# Patient Record
Sex: Male | Born: 1935 | Race: White | Hispanic: No | Marital: Married | Smoking: Former smoker
Health system: Southern US, Community
[De-identification: ages and names within clinical notes are randomized; demographics above are authoritative.]

## PROBLEM LIST (undated history)

## (undated) DIAGNOSIS — I459 Conduction disorder, unspecified: Secondary | ICD-10-CM

## (undated) DIAGNOSIS — I5022 Chronic systolic (congestive) heart failure: Secondary | ICD-10-CM

## (undated) DIAGNOSIS — I472 Ventricular tachycardia, unspecified: Secondary | ICD-10-CM

## (undated) DIAGNOSIS — I509 Heart failure, unspecified: Secondary | ICD-10-CM

## (undated) DIAGNOSIS — I519 Heart disease, unspecified: Secondary | ICD-10-CM

## (undated) DIAGNOSIS — K219 Gastro-esophageal reflux disease without esophagitis: Secondary | ICD-10-CM

## (undated) DIAGNOSIS — Z8719 Personal history of other diseases of the digestive system: Secondary | ICD-10-CM

## (undated) DIAGNOSIS — I10 Essential (primary) hypertension: Secondary | ICD-10-CM

## (undated) DIAGNOSIS — M1710 Unilateral primary osteoarthritis, unspecified knee: Secondary | ICD-10-CM

## (undated) DIAGNOSIS — I2589 Other forms of chronic ischemic heart disease: Secondary | ICD-10-CM

## (undated) DIAGNOSIS — I4891 Unspecified atrial fibrillation: Secondary | ICD-10-CM

## (undated) DIAGNOSIS — IMO0001 Reserved for inherently not codable concepts without codable children: Secondary | ICD-10-CM

## (undated) DIAGNOSIS — Z86010 Personal history of colonic polyps: Secondary | ICD-10-CM

## (undated) DIAGNOSIS — I059 Rheumatic mitral valve disease, unspecified: Secondary | ICD-10-CM

## (undated) DIAGNOSIS — N259 Disorder resulting from impaired renal tubular function, unspecified: Secondary | ICD-10-CM

## (undated) DIAGNOSIS — I6529 Occlusion and stenosis of unspecified carotid artery: Secondary | ICD-10-CM

## (undated) DIAGNOSIS — I25810 Atherosclerosis of coronary artery bypass graft(s) without angina pectoris: Secondary | ICD-10-CM

## (undated) DIAGNOSIS — E785 Hyperlipidemia, unspecified: Secondary | ICD-10-CM

## (undated) HISTORY — DX: Personal history of colonic polyps: Z86.010

## (undated) HISTORY — DX: Hyperlipidemia, unspecified: E78.5

## (undated) HISTORY — DX: Essential (primary) hypertension: I10

## (undated) HISTORY — DX: Atherosclerosis of coronary artery bypass graft(s) without angina pectoris: I25.810

## (undated) HISTORY — DX: Heart disease, unspecified: I51.9

## (undated) HISTORY — DX: Occlusion and stenosis of unspecified carotid artery: I65.29

## (undated) HISTORY — DX: Ventricular tachycardia, unspecified: I47.20

## (undated) HISTORY — PX: INSERT / REPLACE / REMOVE PACEMAKER: SUR710

## (undated) HISTORY — DX: Personal history of other diseases of the digestive system: Z87.19

## (undated) HISTORY — DX: Unspecified atrial fibrillation: I48.91

## (undated) HISTORY — PX: CHOLECYSTECTOMY: SHX55

## (undated) HISTORY — DX: Rheumatic mitral valve disease, unspecified: I05.9

## (undated) HISTORY — DX: Disorder resulting from impaired renal tubular function, unspecified: N25.9

## (undated) HISTORY — DX: Other forms of chronic ischemic heart disease: I25.89

## (undated) HISTORY — DX: Heart failure, unspecified: I50.9

## (undated) HISTORY — PX: THORACOTOMY: SUR1349

## (undated) HISTORY — DX: Chronic systolic (congestive) heart failure: I50.22

## (undated) HISTORY — PX: CARDIAC SURGERY: SHX584

## (undated) HISTORY — DX: Ventricular tachycardia: I47.2

## (undated) HISTORY — DX: Conduction disorder, unspecified: I45.9

## (undated) HISTORY — DX: Gastro-esophageal reflux disease without esophagitis: K21.9

## (undated) HISTORY — DX: Unilateral primary osteoarthritis, unspecified knee: M17.10

## (undated) HISTORY — PX: APPENDECTOMY: SHX54

---
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
## 1985-02-03 HISTORY — PX: LUNG SURGERY: SHX703

## 1997-08-10 ENCOUNTER — Inpatient Hospital Stay (HOSPITAL_COMMUNITY): Admission: EM | Admit: 1997-08-10 | Discharge: 1997-08-20 | Payer: Self-pay | Admitting: Emergency Medicine

## 1997-09-12 ENCOUNTER — Encounter (HOSPITAL_COMMUNITY): Admission: RE | Admit: 1997-09-12 | Discharge: 1997-12-11 | Payer: Self-pay | Admitting: Cardiology

## 1997-11-06 ENCOUNTER — Inpatient Hospital Stay (HOSPITAL_COMMUNITY): Admission: AD | Admit: 1997-11-06 | Discharge: 1997-11-10 | Payer: Self-pay | Admitting: Cardiology

## 1997-12-04 ENCOUNTER — Encounter: Payer: Self-pay | Admitting: Cardiology

## 1997-12-04 ENCOUNTER — Ambulatory Visit (HOSPITAL_COMMUNITY): Admission: RE | Admit: 1997-12-04 | Discharge: 1997-12-04 | Payer: Self-pay | Admitting: Cardiology

## 1997-12-12 ENCOUNTER — Encounter (HOSPITAL_COMMUNITY): Admission: RE | Admit: 1997-12-12 | Discharge: 1998-03-12 | Payer: Self-pay | Admitting: Cardiology

## 1998-04-04 ENCOUNTER — Encounter: Payer: Self-pay | Admitting: Cardiology

## 1998-04-04 ENCOUNTER — Ambulatory Visit (HOSPITAL_COMMUNITY): Admission: RE | Admit: 1998-04-04 | Discharge: 1998-04-04 | Payer: Self-pay | Admitting: Cardiology

## 1999-02-26 ENCOUNTER — Ambulatory Visit (HOSPITAL_COMMUNITY): Admission: RE | Admit: 1999-02-26 | Discharge: 1999-02-26 | Payer: Self-pay | Admitting: Cardiology

## 1999-02-26 ENCOUNTER — Encounter: Payer: Self-pay | Admitting: Cardiology

## 2000-07-31 ENCOUNTER — Inpatient Hospital Stay (HOSPITAL_COMMUNITY): Admission: EM | Admit: 2000-07-31 | Discharge: 2000-08-04 | Payer: Self-pay | Admitting: Emergency Medicine

## 2000-07-31 ENCOUNTER — Encounter: Payer: Self-pay | Admitting: Emergency Medicine

## 2000-09-18 ENCOUNTER — Encounter (INDEPENDENT_AMBULATORY_CARE_PROVIDER_SITE_OTHER): Payer: Self-pay | Admitting: *Deleted

## 2000-09-18 ENCOUNTER — Ambulatory Visit (HOSPITAL_COMMUNITY): Admission: RE | Admit: 2000-09-18 | Discharge: 2000-09-18 | Payer: Self-pay | Admitting: Gastroenterology

## 2001-03-24 ENCOUNTER — Ambulatory Visit (HOSPITAL_COMMUNITY): Admission: RE | Admit: 2001-03-24 | Discharge: 2001-03-24 | Payer: Self-pay | Admitting: Gastroenterology

## 2001-03-24 ENCOUNTER — Encounter: Payer: Self-pay | Admitting: Gastroenterology

## 2001-11-29 ENCOUNTER — Ambulatory Visit (HOSPITAL_COMMUNITY): Admission: RE | Admit: 2001-11-29 | Discharge: 2001-11-30 | Payer: Self-pay | Admitting: Internal Medicine

## 2001-11-30 ENCOUNTER — Encounter: Payer: Self-pay | Admitting: Internal Medicine

## 2002-03-29 ENCOUNTER — Encounter: Admission: RE | Admit: 2002-03-29 | Discharge: 2002-03-29 | Payer: Self-pay | Admitting: Urology

## 2002-03-29 ENCOUNTER — Encounter: Payer: Self-pay | Admitting: Urology

## 2002-04-18 ENCOUNTER — Ambulatory Visit (HOSPITAL_COMMUNITY): Admission: RE | Admit: 2002-04-18 | Discharge: 2002-04-19 | Payer: Self-pay | Admitting: *Deleted

## 2002-04-18 ENCOUNTER — Encounter (INDEPENDENT_AMBULATORY_CARE_PROVIDER_SITE_OTHER): Payer: Self-pay | Admitting: Specialist

## 2002-07-05 ENCOUNTER — Ambulatory Visit (HOSPITAL_COMMUNITY): Admission: RE | Admit: 2002-07-05 | Discharge: 2002-07-06 | Payer: Self-pay | Admitting: Cardiology

## 2003-03-10 ENCOUNTER — Encounter: Admission: RE | Admit: 2003-03-10 | Discharge: 2003-03-10 | Payer: Self-pay | Admitting: Obstetrics and Gynecology

## 2003-03-17 ENCOUNTER — Inpatient Hospital Stay (HOSPITAL_COMMUNITY): Admission: EM | Admit: 2003-03-17 | Discharge: 2003-03-22 | Payer: Self-pay | Admitting: Emergency Medicine

## 2003-08-10 ENCOUNTER — Ambulatory Visit (HOSPITAL_COMMUNITY): Admission: RE | Admit: 2003-08-10 | Discharge: 2003-08-10 | Payer: Self-pay | Admitting: Family Medicine

## 2004-01-17 ENCOUNTER — Ambulatory Visit: Payer: Self-pay | Admitting: Internal Medicine

## 2004-03-22 ENCOUNTER — Ambulatory Visit (HOSPITAL_COMMUNITY): Admission: RE | Admit: 2004-03-22 | Discharge: 2004-03-22 | Payer: Self-pay | Admitting: Family Medicine

## 2004-04-05 ENCOUNTER — Ambulatory Visit: Payer: Self-pay | Admitting: Orthopedic Surgery

## 2004-04-12 ENCOUNTER — Ambulatory Visit: Payer: Self-pay

## 2004-05-10 ENCOUNTER — Ambulatory Visit: Admission: RE | Admit: 2004-05-10 | Discharge: 2004-05-10 | Payer: Self-pay | Admitting: Pulmonary Disease

## 2004-05-10 ENCOUNTER — Ambulatory Visit: Payer: Self-pay | Admitting: Internal Medicine

## 2004-05-20 ENCOUNTER — Ambulatory Visit: Payer: Self-pay | Admitting: Internal Medicine

## 2004-05-20 ENCOUNTER — Encounter (HOSPITAL_COMMUNITY): Admission: RE | Admit: 2004-05-20 | Discharge: 2004-08-18 | Payer: Self-pay | Admitting: Cardiology

## 2004-05-24 ENCOUNTER — Ambulatory Visit: Payer: Self-pay

## 2004-05-31 ENCOUNTER — Ambulatory Visit: Payer: Self-pay | Admitting: Internal Medicine

## 2004-06-05 ENCOUNTER — Ambulatory Visit (HOSPITAL_COMMUNITY): Admission: RE | Admit: 2004-06-05 | Discharge: 2004-06-05 | Payer: Self-pay | Admitting: Internal Medicine

## 2004-06-05 ENCOUNTER — Ambulatory Visit: Payer: Self-pay | Admitting: Internal Medicine

## 2004-06-10 ENCOUNTER — Ambulatory Visit: Payer: Self-pay | Admitting: Internal Medicine

## 2004-06-13 ENCOUNTER — Ambulatory Visit: Payer: Self-pay

## 2004-06-17 ENCOUNTER — Ambulatory Visit: Payer: Self-pay | Admitting: Internal Medicine

## 2004-06-24 ENCOUNTER — Ambulatory Visit: Payer: Self-pay | Admitting: Internal Medicine

## 2004-07-15 ENCOUNTER — Ambulatory Visit: Payer: Self-pay | Admitting: Internal Medicine

## 2004-07-23 ENCOUNTER — Ambulatory Visit: Payer: Self-pay | Admitting: Cardiology

## 2004-07-25 ENCOUNTER — Ambulatory Visit: Payer: Self-pay | Admitting: Internal Medicine

## 2004-08-12 ENCOUNTER — Ambulatory Visit: Admission: RE | Admit: 2004-08-12 | Discharge: 2004-08-12 | Payer: Self-pay | Admitting: Pulmonary Disease

## 2004-08-12 ENCOUNTER — Ambulatory Visit: Payer: Self-pay | Admitting: Pulmonary Disease

## 2004-08-19 ENCOUNTER — Encounter (HOSPITAL_COMMUNITY): Admission: RE | Admit: 2004-08-19 | Discharge: 2004-09-03 | Payer: Self-pay | Admitting: Cardiology

## 2004-09-19 ENCOUNTER — Ambulatory Visit: Payer: Self-pay

## 2004-10-04 ENCOUNTER — Ambulatory Visit (HOSPITAL_COMMUNITY): Admission: RE | Admit: 2004-10-04 | Discharge: 2004-10-04 | Payer: Self-pay | Admitting: Family Medicine

## 2004-10-25 ENCOUNTER — Ambulatory Visit: Payer: Self-pay | Admitting: Cardiology

## 2004-11-01 ENCOUNTER — Ambulatory Visit: Payer: Self-pay | Admitting: Internal Medicine

## 2004-11-04 ENCOUNTER — Ambulatory Visit: Payer: Self-pay | Admitting: Internal Medicine

## 2004-11-15 ENCOUNTER — Ambulatory Visit: Payer: Self-pay | Admitting: Cardiology

## 2004-11-22 ENCOUNTER — Encounter (HOSPITAL_COMMUNITY): Admission: RE | Admit: 2004-11-22 | Discharge: 2004-12-04 | Payer: Self-pay | Admitting: Cardiology

## 2005-01-13 ENCOUNTER — Inpatient Hospital Stay (HOSPITAL_COMMUNITY): Admission: EM | Admit: 2005-01-13 | Discharge: 2005-01-15 | Payer: Self-pay | Admitting: Emergency Medicine

## 2005-01-14 ENCOUNTER — Ambulatory Visit: Payer: Self-pay | Admitting: Cardiovascular Disease

## 2005-01-17 ENCOUNTER — Ambulatory Visit: Payer: Self-pay | Admitting: Cardiology

## 2005-01-31 ENCOUNTER — Ambulatory Visit: Payer: Self-pay | Admitting: Cardiology

## 2005-02-14 ENCOUNTER — Ambulatory Visit: Payer: Self-pay | Admitting: Cardiology

## 2005-02-21 ENCOUNTER — Encounter (HOSPITAL_COMMUNITY): Admission: RE | Admit: 2005-02-21 | Discharge: 2005-03-05 | Payer: Self-pay | Admitting: Cardiology

## 2005-03-06 ENCOUNTER — Ambulatory Visit: Payer: Self-pay

## 2005-03-06 ENCOUNTER — Ambulatory Visit: Payer: Self-pay | Admitting: Internal Medicine

## 2005-03-06 ENCOUNTER — Inpatient Hospital Stay (HOSPITAL_COMMUNITY): Admission: AD | Admit: 2005-03-06 | Discharge: 2005-03-18 | Payer: Self-pay | Admitting: Cardiology

## 2005-03-06 ENCOUNTER — Ambulatory Visit: Payer: Self-pay | Admitting: Cardiology

## 2005-03-26 ENCOUNTER — Ambulatory Visit: Payer: Self-pay | Admitting: Internal Medicine

## 2005-04-24 LAB — HM COLONOSCOPY: HM Colonoscopy: ABNORMAL

## 2005-04-25 ENCOUNTER — Ambulatory Visit: Payer: Self-pay | Admitting: Cardiology

## 2005-05-05 ENCOUNTER — Ambulatory Visit: Payer: Self-pay | Admitting: Pulmonary Disease

## 2005-05-06 ENCOUNTER — Ambulatory Visit: Admission: RE | Admit: 2005-05-06 | Discharge: 2005-05-06 | Payer: Self-pay | Admitting: Pulmonary Disease

## 2005-05-20 ENCOUNTER — Ambulatory Visit: Payer: Self-pay

## 2005-05-23 ENCOUNTER — Encounter (HOSPITAL_COMMUNITY): Admission: RE | Admit: 2005-05-23 | Discharge: 2005-08-21 | Payer: Self-pay | Admitting: Cardiology

## 2005-06-10 ENCOUNTER — Ambulatory Visit: Payer: Self-pay | Admitting: Internal Medicine

## 2005-06-27 ENCOUNTER — Ambulatory Visit: Payer: Self-pay | Admitting: Cardiology

## 2005-06-27 ENCOUNTER — Ambulatory Visit: Payer: Self-pay

## 2005-07-03 ENCOUNTER — Ambulatory Visit: Payer: Self-pay | Admitting: Cardiology

## 2005-07-17 ENCOUNTER — Ambulatory Visit: Payer: Self-pay | Admitting: Cardiology

## 2005-07-28 ENCOUNTER — Ambulatory Visit: Payer: Self-pay | Admitting: Cardiology

## 2005-08-13 ENCOUNTER — Encounter: Payer: Self-pay | Admitting: Family Medicine

## 2005-08-22 ENCOUNTER — Encounter (HOSPITAL_COMMUNITY): Admission: RE | Admit: 2005-08-22 | Discharge: 2005-09-02 | Payer: Self-pay | Admitting: Cardiology

## 2005-09-11 ENCOUNTER — Ambulatory Visit: Payer: Self-pay | Admitting: Internal Medicine

## 2005-10-17 ENCOUNTER — Ambulatory Visit: Payer: Self-pay | Admitting: Cardiology

## 2005-10-23 ENCOUNTER — Ambulatory Visit: Payer: Self-pay | Admitting: Cardiology

## 2005-10-27 ENCOUNTER — Ambulatory Visit: Payer: Self-pay | Admitting: Cardiology

## 2005-11-03 ENCOUNTER — Ambulatory Visit: Payer: Self-pay | Admitting: Cardiology

## 2005-11-12 ENCOUNTER — Inpatient Hospital Stay (HOSPITAL_COMMUNITY): Admission: EM | Admit: 2005-11-12 | Discharge: 2005-11-17 | Payer: Self-pay | Admitting: Emergency Medicine

## 2005-11-12 ENCOUNTER — Ambulatory Visit: Payer: Self-pay | Admitting: Cardiology

## 2005-11-13 ENCOUNTER — Encounter: Payer: Self-pay | Admitting: Cardiology

## 2005-11-13 ENCOUNTER — Encounter: Payer: Self-pay | Admitting: Vascular Surgery

## 2005-11-20 ENCOUNTER — Ambulatory Visit: Payer: Self-pay | Admitting: Internal Medicine

## 2005-11-27 ENCOUNTER — Ambulatory Visit: Payer: Self-pay | Admitting: Cardiology

## 2005-12-05 ENCOUNTER — Ambulatory Visit: Payer: Self-pay | Admitting: Cardiology

## 2005-12-11 ENCOUNTER — Ambulatory Visit: Payer: Self-pay | Admitting: Internal Medicine

## 2005-12-15 ENCOUNTER — Ambulatory Visit: Payer: Self-pay | Admitting: Cardiology

## 2005-12-15 ENCOUNTER — Ambulatory Visit: Payer: Self-pay | Admitting: Cardiovascular Disease

## 2006-01-02 ENCOUNTER — Ambulatory Visit: Payer: Self-pay | Admitting: Cardiology

## 2006-01-02 ENCOUNTER — Ambulatory Visit: Payer: Self-pay

## 2006-01-19 ENCOUNTER — Ambulatory Visit: Payer: Self-pay | Admitting: Cardiology

## 2006-02-02 ENCOUNTER — Ambulatory Visit: Payer: Self-pay | Admitting: Cardiovascular Disease

## 2006-02-16 ENCOUNTER — Ambulatory Visit: Payer: Self-pay | Admitting: Cardiology

## 2006-02-16 LAB — CONVERTED CEMR LAB
CO2: 29 meq/L (ref 19–32)
Chloride: 102 meq/L (ref 96–112)
Creatinine, Ser: 1.7 mg/dL — ABNORMAL HIGH (ref 0.4–1.5)
Glucose, Bld: 95 mg/dL (ref 70–99)
Magnesium: 2.3 mg/dL (ref 1.5–2.5)
Sodium: 137 meq/L (ref 135–145)

## 2006-03-09 ENCOUNTER — Ambulatory Visit: Payer: Self-pay | Admitting: Cardiology

## 2006-03-12 ENCOUNTER — Ambulatory Visit: Payer: Self-pay | Admitting: Internal Medicine

## 2006-03-30 ENCOUNTER — Ambulatory Visit: Payer: Self-pay | Admitting: Cardiology

## 2006-03-30 ENCOUNTER — Ambulatory Visit: Payer: Self-pay | Admitting: Internal Medicine

## 2006-04-23 ENCOUNTER — Ambulatory Visit: Payer: Self-pay | Admitting: Cardiology

## 2006-04-27 ENCOUNTER — Ambulatory Visit: Payer: Self-pay | Admitting: Internal Medicine

## 2006-05-26 ENCOUNTER — Ambulatory Visit: Payer: Self-pay | Admitting: Cardiology

## 2006-05-26 LAB — CONVERTED CEMR LAB
Chloride: 104 meq/L (ref 96–112)
Creatinine, Ser: 1.7 mg/dL — ABNORMAL HIGH (ref 0.4–1.5)
Glucose, Bld: 92 mg/dL (ref 70–99)
Sodium: 140 meq/L (ref 135–145)

## 2006-06-22 ENCOUNTER — Ambulatory Visit: Payer: Self-pay | Admitting: Cardiology

## 2006-06-25 ENCOUNTER — Ambulatory Visit: Payer: Self-pay | Admitting: Internal Medicine

## 2006-07-13 ENCOUNTER — Ambulatory Visit: Payer: Self-pay | Admitting: Cardiovascular Disease

## 2006-08-11 ENCOUNTER — Ambulatory Visit: Payer: Self-pay | Admitting: Cardiology

## 2006-09-08 ENCOUNTER — Ambulatory Visit: Payer: Self-pay | Admitting: Cardiology

## 2006-09-24 ENCOUNTER — Ambulatory Visit: Payer: Self-pay | Admitting: Internal Medicine

## 2006-10-06 ENCOUNTER — Ambulatory Visit: Payer: Self-pay | Admitting: Internal Medicine

## 2006-11-02 ENCOUNTER — Ambulatory Visit: Payer: Self-pay | Admitting: Cardiology

## 2006-11-23 ENCOUNTER — Ambulatory Visit: Payer: Self-pay | Admitting: Cardiology

## 2006-11-23 ENCOUNTER — Ambulatory Visit: Payer: Self-pay | Admitting: Internal Medicine

## 2006-11-23 LAB — CONVERTED CEMR LAB
Calcium: 9.6 mg/dL (ref 8.4–10.5)
Chloride: 104 meq/L (ref 96–112)
GFR calc non Af Amer: 49 mL/min
Glucose, Bld: 102 mg/dL — ABNORMAL HIGH (ref 70–99)
INR: 2.3 — ABNORMAL HIGH (ref 0.8–1.0)

## 2006-12-14 ENCOUNTER — Ambulatory Visit: Payer: Self-pay | Admitting: Cardiology

## 2006-12-24 ENCOUNTER — Ambulatory Visit: Payer: Self-pay | Admitting: Internal Medicine

## 2007-01-07 ENCOUNTER — Ambulatory Visit: Payer: Self-pay

## 2007-01-07 ENCOUNTER — Ambulatory Visit: Payer: Self-pay | Admitting: Cardiovascular Disease

## 2007-02-01 IMAGING — CT CT CHEST W/O CM
1 series · 15 of 31 positions shown, 19 images · IV contrast (agent unspecified)
Comparison: none

CLINICAL DATA: Follow-up lung mass.
 CT CHEST WITHOUT CONTRAST:
 Multidetector helical scans through the chest were performed in the unenhanced state.  This scan is compared to a prior CT of the chest of 08/10/03.
 The patchy opacity within the right lower lobe posteriorly has not change significantly.  This amorphus opacity is somewhat difficult to accurately measure but on today?s study measures approximately 36 x 16 mm compared to 33 x 14 mm previously. The configuration does not appear to have changed significantly and the slight variation of measurements most likely is due to scanning plane. I would suggest that this be followed up to two years from its initial detection which was in March 2003.  There appears to be a suture line in the right middle lobe on image 33 which has been present previously.  Other areas of pleural parenchymal scarring are stable as well. No effusion is seen. Pacer is noted. No mediastinal or hilar adenopathy is seen.

[Series 2: routine chest · axial · 0.76mm/px · z∈[-390,-85]mm · 15 of 67 slices shown, 19 images]
[im 3/67  mediastinal]
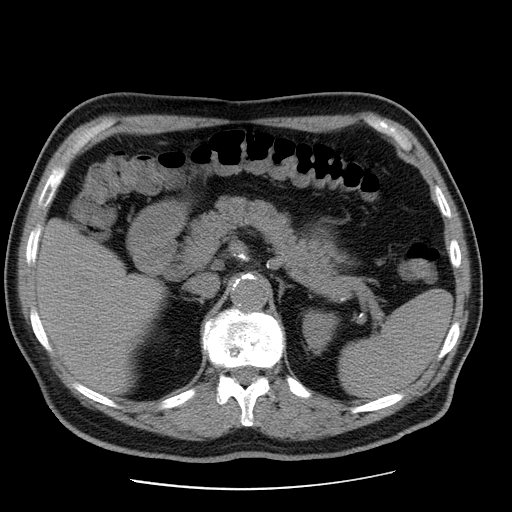
[im 3/67  lung]
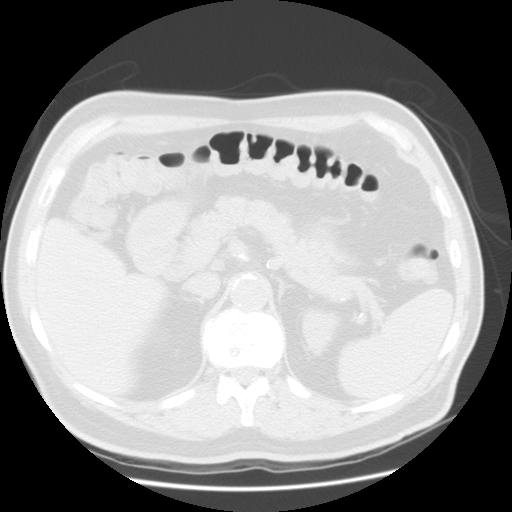
[im 8/67  lung]
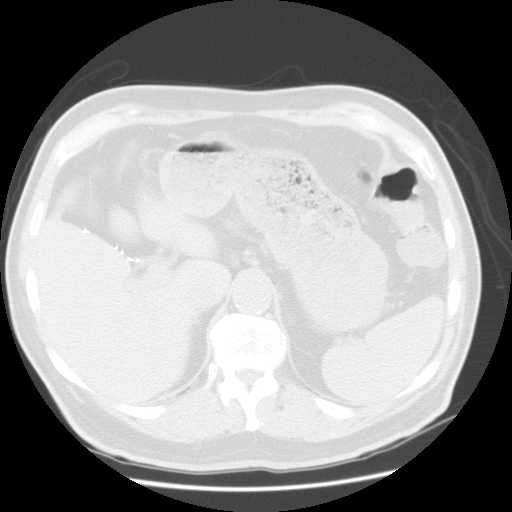
[im 13/67  lung]
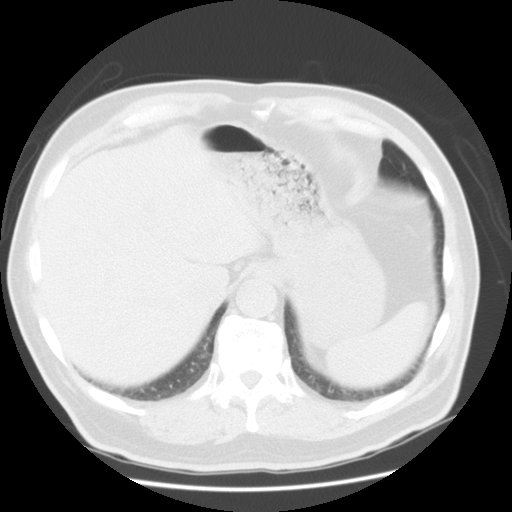
[im 15/67  lung]
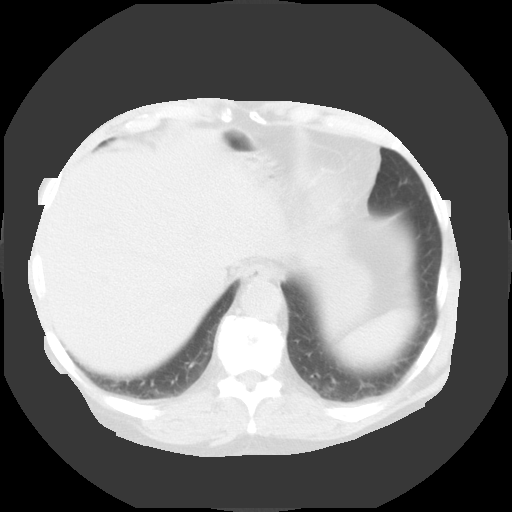
[im 20/67  mediastinal]
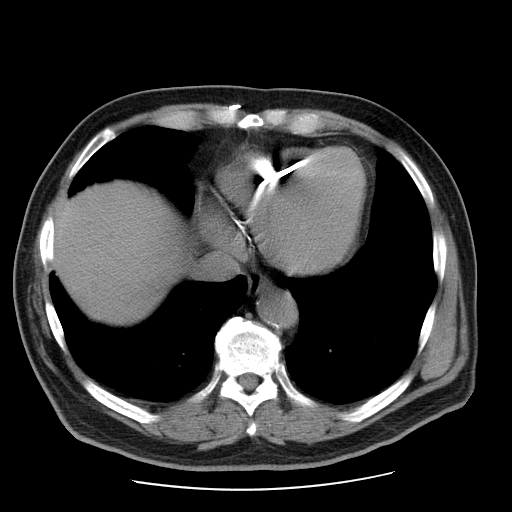
[im 20/67  lung]
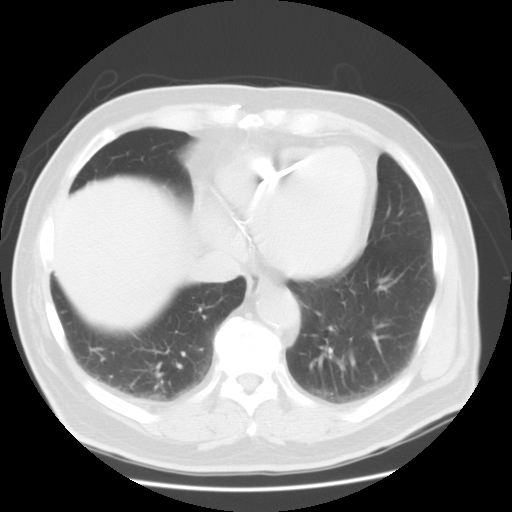
[im 25/67  lung]
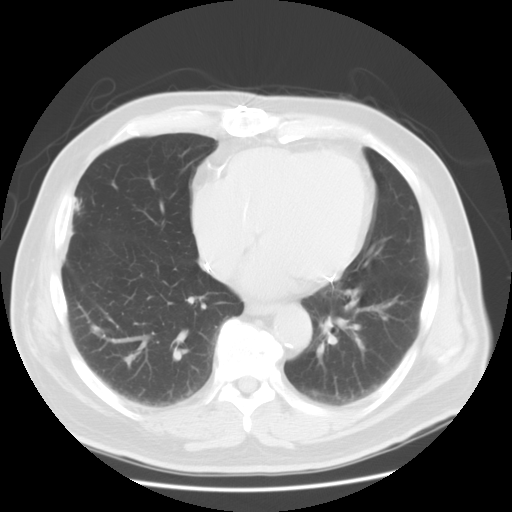
[im 30/67  lung]
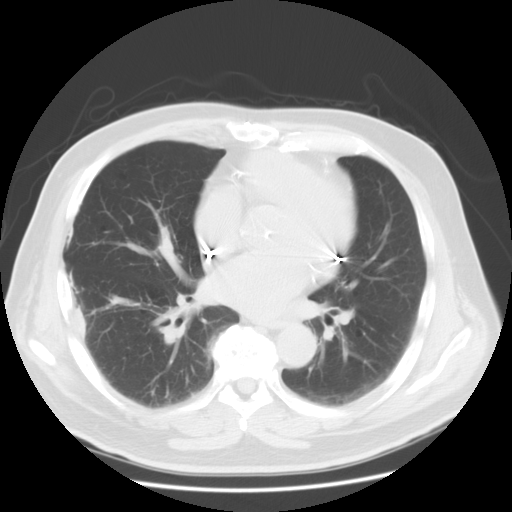
[im 35/67  lung]
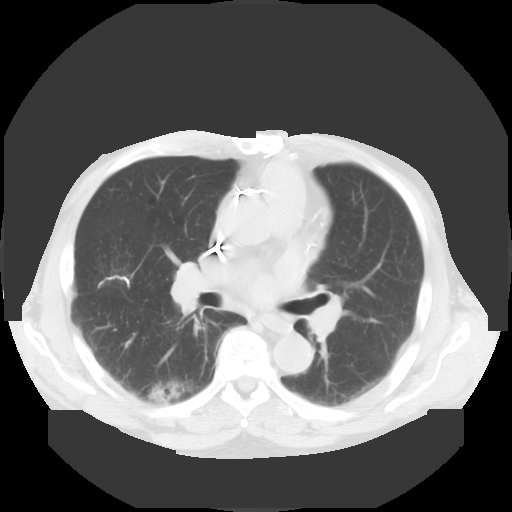
[im 37/67  mediastinal]
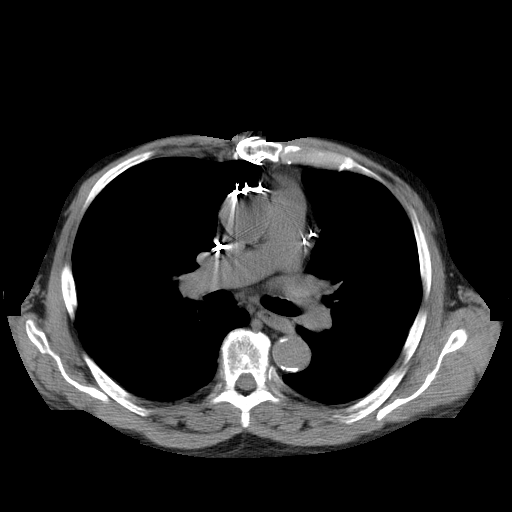
[im 37/67  lung]
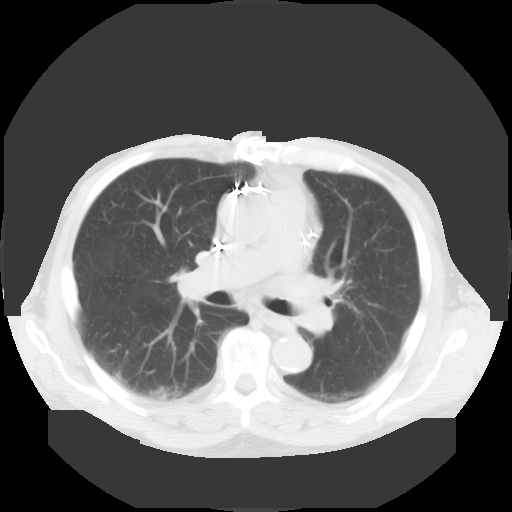
[im 42/67  lung]
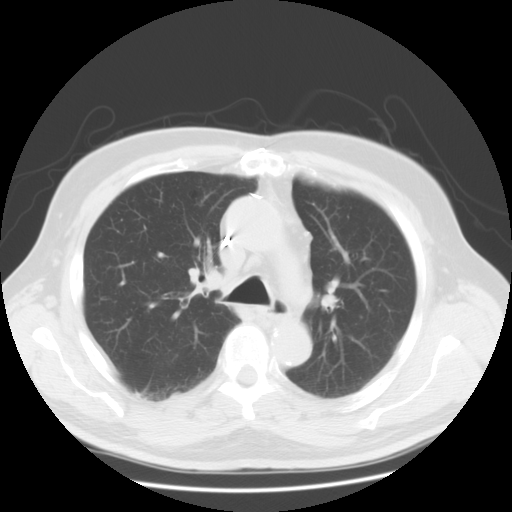
[im 47/67  lung]
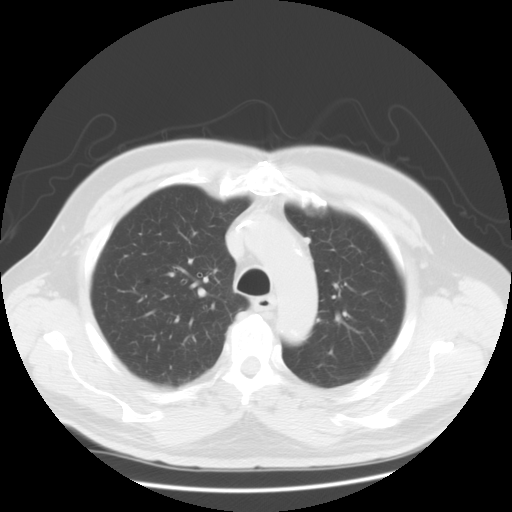
[im 52/67  lung]
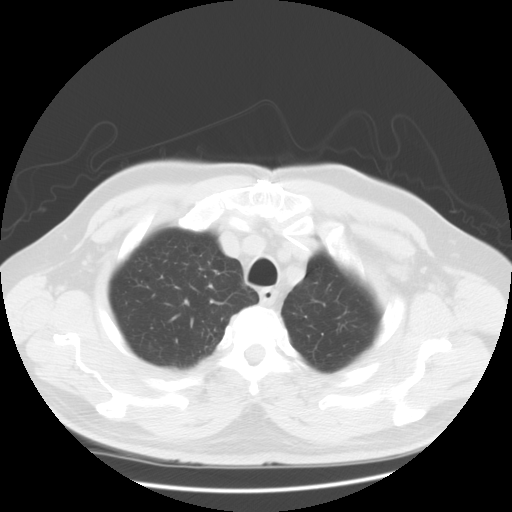
[im 54/67  mediastinal]
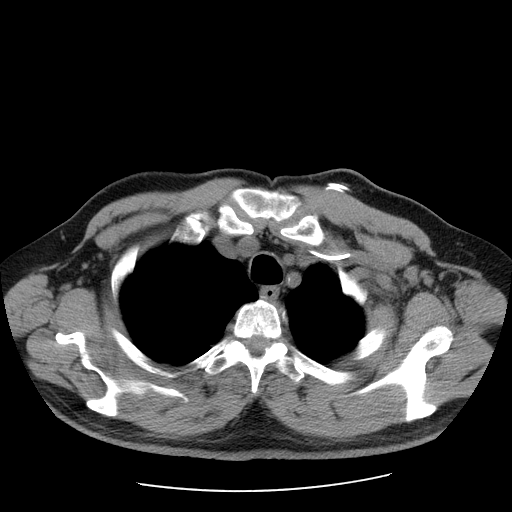
[im 54/67  lung]
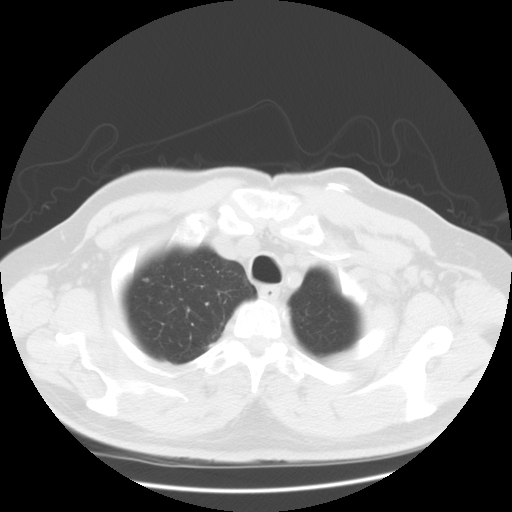
[im 59/67  lung]
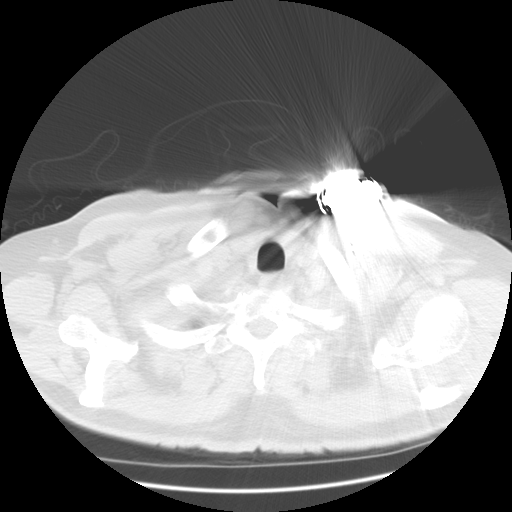
[im 64/67  lung]
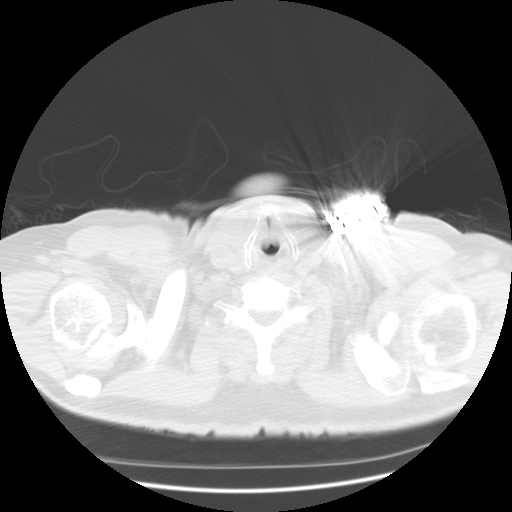

[15 of 31 positions shown; findings below may reference images not displayed]

IMPRESSION: No significant change in patchy parenchymal opacity in the posterior right lower lobe.  Suggest continued follow-up for up to two years after initial detection which was in March 2003.
 Other areas of scarring appear stable as well.

## 2007-02-08 ENCOUNTER — Ambulatory Visit: Payer: Self-pay | Admitting: Internal Medicine

## 2007-03-01 ENCOUNTER — Ambulatory Visit: Payer: Self-pay | Admitting: Cardiology

## 2007-03-08 ENCOUNTER — Ambulatory Visit: Payer: Self-pay | Admitting: Internal Medicine

## 2007-03-29 ENCOUNTER — Ambulatory Visit: Payer: Self-pay | Admitting: Internal Medicine

## 2007-04-19 ENCOUNTER — Ambulatory Visit: Payer: Self-pay | Admitting: Cardiology

## 2007-04-19 ENCOUNTER — Ambulatory Visit: Payer: Self-pay | Admitting: Internal Medicine

## 2007-04-19 LAB — CONVERTED CEMR LAB
BUN: 19 mg/dL (ref 6–23)
Chloride: 105 meq/L (ref 96–112)
Creatinine, Ser: 1.6 mg/dL — ABNORMAL HIGH (ref 0.4–1.5)
Sodium: 140 meq/L (ref 135–145)

## 2007-05-17 ENCOUNTER — Ambulatory Visit: Payer: Self-pay | Admitting: Internal Medicine

## 2007-06-10 ENCOUNTER — Ambulatory Visit: Payer: Self-pay | Admitting: Internal Medicine

## 2007-06-14 ENCOUNTER — Ambulatory Visit: Payer: Self-pay | Admitting: Cardiology

## 2007-07-12 ENCOUNTER — Ambulatory Visit: Payer: Self-pay | Admitting: Cardiology

## 2007-08-09 ENCOUNTER — Ambulatory Visit: Payer: Self-pay | Admitting: Cardiology

## 2007-08-09 ENCOUNTER — Ambulatory Visit: Payer: Self-pay | Admitting: Internal Medicine

## 2007-08-09 LAB — CONVERTED CEMR LAB
CO2: 27 meq/L (ref 19–32)
Chloride: 105 meq/L (ref 96–112)
Glucose, Bld: 122 mg/dL — ABNORMAL HIGH (ref 70–99)
Potassium: 3.6 meq/L (ref 3.5–5.1)
Sodium: 140 meq/L (ref 135–145)

## 2007-08-10 ENCOUNTER — Ambulatory Visit: Payer: Self-pay

## 2007-08-30 ENCOUNTER — Ambulatory Visit: Payer: Self-pay | Admitting: Cardiology

## 2007-09-09 ENCOUNTER — Ambulatory Visit: Payer: Self-pay | Admitting: Internal Medicine

## 2007-09-28 ENCOUNTER — Ambulatory Visit: Payer: Self-pay | Admitting: Cardiology

## 2007-10-11 ENCOUNTER — Inpatient Hospital Stay (HOSPITAL_COMMUNITY): Admission: EM | Admit: 2007-10-11 | Discharge: 2007-10-21 | Payer: Self-pay | Admitting: Emergency Medicine

## 2007-10-11 ENCOUNTER — Ambulatory Visit: Payer: Self-pay | Admitting: Cardiology

## 2007-10-15 ENCOUNTER — Encounter: Payer: Self-pay | Admitting: Cardiology

## 2007-10-19 ENCOUNTER — Ambulatory Visit: Payer: Self-pay | Admitting: Infectious Diseases

## 2007-10-21 ENCOUNTER — Ambulatory Visit: Payer: Self-pay | Admitting: *Deleted

## 2007-10-21 ENCOUNTER — Encounter: Payer: Self-pay | Admitting: Cardiology

## 2007-10-25 ENCOUNTER — Ambulatory Visit: Payer: Self-pay | Admitting: Internal Medicine

## 2007-11-11 ENCOUNTER — Ambulatory Visit: Payer: Self-pay | Admitting: Cardiology

## 2007-11-11 LAB — CONVERTED CEMR LAB
BUN: 18 mg/dL (ref 6–23)
Basophils Absolute: 0 10*3/uL (ref 0.0–0.1)
Basophils Relative: 0.6 % (ref 0.0–3.0)
Calcium: 9.5 mg/dL (ref 8.4–10.5)
Creatinine, Ser: 1.6 mg/dL — ABNORMAL HIGH (ref 0.4–1.5)
Eosinophils Absolute: 0.3 10*3/uL (ref 0.0–0.7)
GFR calc non Af Amer: 45 mL/min
HCT: 43.9 % (ref 39.0–52.0)
Hemoglobin: 15.3 g/dL (ref 13.0–17.0)
MCHC: 34.8 g/dL (ref 30.0–36.0)
MCV: 90.2 fL (ref 78.0–100.0)
Neutro Abs: 3.3 10*3/uL (ref 1.4–7.7)
RBC: 4.87 M/uL (ref 4.22–5.81)

## 2007-12-06 ENCOUNTER — Ambulatory Visit: Payer: Self-pay | Admitting: Cardiology

## 2007-12-09 ENCOUNTER — Ambulatory Visit: Payer: Self-pay | Admitting: Internal Medicine

## 2007-12-20 ENCOUNTER — Ambulatory Visit: Payer: Self-pay | Admitting: Cardiology

## 2008-01-03 ENCOUNTER — Ambulatory Visit: Payer: Self-pay | Admitting: Cardiovascular Disease

## 2008-01-17 ENCOUNTER — Ambulatory Visit: Payer: Self-pay | Admitting: Internal Medicine

## 2008-02-07 ENCOUNTER — Ambulatory Visit: Payer: Self-pay | Admitting: Cardiology

## 2008-02-07 ENCOUNTER — Encounter: Payer: Self-pay | Admitting: Family Medicine

## 2008-02-22 ENCOUNTER — Ambulatory Visit: Payer: Self-pay | Admitting: Cardiology

## 2008-03-17 ENCOUNTER — Ambulatory Visit: Payer: Self-pay | Admitting: Internal Medicine

## 2008-03-29 ENCOUNTER — Encounter: Payer: Self-pay | Admitting: Internal Medicine

## 2008-04-03 ENCOUNTER — Ambulatory Visit: Payer: Self-pay | Admitting: Cardiology

## 2008-04-07 ENCOUNTER — Ambulatory Visit: Payer: Self-pay | Admitting: Family Medicine

## 2008-04-07 DIAGNOSIS — E785 Hyperlipidemia, unspecified: Secondary | ICD-10-CM

## 2008-04-07 DIAGNOSIS — K219 Gastro-esophageal reflux disease without esophagitis: Secondary | ICD-10-CM

## 2008-04-07 DIAGNOSIS — Z860100 Personal history of colon polyps, unspecified: Secondary | ICD-10-CM

## 2008-04-07 DIAGNOSIS — I10 Essential (primary) hypertension: Secondary | ICD-10-CM

## 2008-04-07 DIAGNOSIS — M1710 Unilateral primary osteoarthritis, unspecified knee: Secondary | ICD-10-CM

## 2008-04-07 DIAGNOSIS — Z8719 Personal history of other diseases of the digestive system: Secondary | ICD-10-CM

## 2008-04-07 DIAGNOSIS — IMO0002 Reserved for concepts with insufficient information to code with codable children: Secondary | ICD-10-CM

## 2008-04-07 HISTORY — DX: Gastro-esophageal reflux disease without esophagitis: K21.9

## 2008-04-07 HISTORY — DX: Personal history of other diseases of the digestive system: Z87.19

## 2008-04-07 HISTORY — DX: Personal history of colonic polyps: Z86.010

## 2008-04-07 HISTORY — DX: Personal history of colon polyps, unspecified: Z86.0100

## 2008-04-07 HISTORY — DX: Reserved for concepts with insufficient information to code with codable children: IMO0002

## 2008-04-07 HISTORY — DX: Essential (primary) hypertension: I10

## 2008-04-07 HISTORY — DX: Hyperlipidemia, unspecified: E78.5

## 2008-04-14 ENCOUNTER — Emergency Department (HOSPITAL_COMMUNITY): Admission: EM | Admit: 2008-04-14 | Discharge: 2008-04-14 | Payer: Self-pay | Admitting: *Deleted

## 2008-04-24 ENCOUNTER — Ambulatory Visit: Payer: Self-pay | Admitting: Cardiology

## 2008-04-24 LAB — CONVERTED CEMR LAB
Calcium: 9.9 mg/dL (ref 8.4–10.5)
Creatinine, Ser: 1.4 mg/dL (ref 0.4–1.5)
GFR calc non Af Amer: 52.51 mL/min (ref >60)
Glucose, Bld: 97 mg/dL (ref 70–99)
Sodium: 141 meq/L (ref 135–145)

## 2008-05-19 ENCOUNTER — Telehealth: Payer: Self-pay | Admitting: Cardiology

## 2008-05-22 ENCOUNTER — Ambulatory Visit: Payer: Self-pay | Admitting: Cardiology

## 2008-05-22 ENCOUNTER — Encounter: Payer: Self-pay | Admitting: Cardiology

## 2008-05-22 DIAGNOSIS — I059 Rheumatic mitral valve disease, unspecified: Secondary | ICD-10-CM | POA: Insufficient documentation

## 2008-05-22 DIAGNOSIS — I2589 Other forms of chronic ischemic heart disease: Secondary | ICD-10-CM

## 2008-05-22 DIAGNOSIS — I25700 Atherosclerosis of coronary artery bypass graft(s), unspecified, with unstable angina pectoris: Secondary | ICD-10-CM

## 2008-05-22 DIAGNOSIS — I459 Conduction disorder, unspecified: Secondary | ICD-10-CM | POA: Insufficient documentation

## 2008-05-22 DIAGNOSIS — I25810 Atherosclerosis of coronary artery bypass graft(s) without angina pectoris: Secondary | ICD-10-CM

## 2008-05-22 HISTORY — DX: Other forms of chronic ischemic heart disease: I25.89

## 2008-05-22 HISTORY — DX: Atherosclerosis of coronary artery bypass graft(s) without angina pectoris: I25.810

## 2008-05-22 HISTORY — DX: Rheumatic mitral valve disease, unspecified: I05.9

## 2008-05-22 HISTORY — DX: Conduction disorder, unspecified: I45.9

## 2008-05-22 LAB — CONVERTED CEMR LAB
Chloride: 103 meq/L (ref 96–112)
Creatinine, Ser: 1.6 mg/dL — ABNORMAL HIGH (ref 0.4–1.5)
GFR calc non Af Amer: 45 mL/min (ref >60)
Potassium: 4.1 meq/L (ref 3.5–5.1)

## 2008-06-06 ENCOUNTER — Encounter: Payer: Self-pay | Admitting: Cardiology

## 2008-06-15 ENCOUNTER — Ambulatory Visit: Payer: Self-pay | Admitting: Internal Medicine

## 2008-06-19 ENCOUNTER — Ambulatory Visit: Payer: Self-pay | Admitting: Internal Medicine

## 2008-06-28 ENCOUNTER — Ambulatory Visit: Payer: Self-pay | Admitting: Family Medicine

## 2008-06-28 DIAGNOSIS — R109 Unspecified abdominal pain: Secondary | ICD-10-CM

## 2008-06-29 LAB — CONVERTED CEMR LAB
Basophils Relative: 0.6 % (ref 0.0–3.0)
Eosinophils Relative: 0.7 % (ref 0.0–5.0)
MCV: 87 fL (ref 78.0–100.0)
Monocytes Absolute: 0.8 10*3/uL (ref 0.1–1.0)
Monocytes Relative: 7.7 % (ref 3.0–12.0)
Neutrophils Relative %: 77.6 % — ABNORMAL HIGH (ref 43.0–77.0)
Platelets: 137 10*3/uL — ABNORMAL LOW (ref 150.0–400.0)
RBC: 5.41 M/uL (ref 4.22–5.81)
WBC: 10.9 10*3/uL — ABNORMAL HIGH (ref 4.5–10.5)

## 2008-07-04 ENCOUNTER — Ambulatory Visit: Payer: Self-pay | Admitting: Family Medicine

## 2008-07-04 ENCOUNTER — Encounter: Payer: Self-pay | Admitting: *Deleted

## 2008-07-04 LAB — CONVERTED CEMR LAB
INR: 2.3
Prothrombin Time: 18.5 s

## 2008-07-07 ENCOUNTER — Encounter: Payer: Self-pay | Admitting: Cardiology

## 2008-07-11 ENCOUNTER — Encounter: Payer: Self-pay | Admitting: Family Medicine

## 2008-07-14 ENCOUNTER — Telehealth (INDEPENDENT_AMBULATORY_CARE_PROVIDER_SITE_OTHER): Payer: Self-pay | Admitting: *Deleted

## 2008-07-20 ENCOUNTER — Ambulatory Visit: Payer: Self-pay | Admitting: Internal Medicine

## 2008-07-20 ENCOUNTER — Ambulatory Visit: Payer: Self-pay | Admitting: Cardiology

## 2008-07-20 LAB — CONVERTED CEMR LAB
POC INR: 2.3
Protime: 18.4

## 2008-07-31 LAB — CONVERTED CEMR LAB
Calcium: 9.8 mg/dL (ref 8.4–10.5)
GFR calc non Af Amer: 52.48 mL/min (ref >60)
Potassium: 3.6 meq/L (ref 3.5–5.1)
Sodium: 139 meq/L (ref 135–145)

## 2008-08-07 ENCOUNTER — Encounter: Payer: Self-pay | Admitting: Cardiology

## 2008-08-09 ENCOUNTER — Encounter: Payer: Self-pay | Admitting: *Deleted

## 2008-08-14 ENCOUNTER — Ambulatory Visit: Payer: Self-pay | Admitting: Cardiovascular Disease

## 2008-08-14 ENCOUNTER — Encounter (INDEPENDENT_AMBULATORY_CARE_PROVIDER_SITE_OTHER): Payer: Self-pay | Admitting: Cardiology

## 2008-08-14 LAB — CONVERTED CEMR LAB
POC INR: 2.2
Prothrombin Time: 18.1 s

## 2008-09-07 ENCOUNTER — Encounter: Payer: Self-pay | Admitting: Cardiology

## 2008-09-11 ENCOUNTER — Encounter (INDEPENDENT_AMBULATORY_CARE_PROVIDER_SITE_OTHER): Payer: Self-pay | Admitting: Cardiology

## 2008-09-11 ENCOUNTER — Ambulatory Visit: Payer: Self-pay | Admitting: Internal Medicine

## 2008-09-11 LAB — CONVERTED CEMR LAB
POC INR: 2.2
Prothrombin Time: 18.2 s

## 2008-09-14 ENCOUNTER — Ambulatory Visit: Payer: Self-pay | Admitting: Internal Medicine

## 2008-09-16 ENCOUNTER — Encounter: Payer: Self-pay | Admitting: Cardiology

## 2008-09-17 ENCOUNTER — Encounter: Payer: Self-pay | Admitting: Cardiology

## 2008-09-29 ENCOUNTER — Encounter: Payer: Self-pay | Admitting: Cardiology

## 2008-09-30 ENCOUNTER — Encounter: Payer: Self-pay | Admitting: Cardiology

## 2008-09-30 ENCOUNTER — Emergency Department (HOSPITAL_COMMUNITY): Admission: EM | Admit: 2008-09-30 | Discharge: 2008-09-30 | Payer: Self-pay | Admitting: Emergency Medicine

## 2008-10-01 ENCOUNTER — Encounter: Payer: Self-pay | Admitting: Cardiology

## 2008-10-02 ENCOUNTER — Ambulatory Visit: Payer: Self-pay | Admitting: Cardiovascular Disease

## 2008-10-02 ENCOUNTER — Ambulatory Visit: Payer: Self-pay | Admitting: Cardiology

## 2008-10-06 ENCOUNTER — Telehealth: Payer: Self-pay | Admitting: Cardiology

## 2008-10-08 ENCOUNTER — Encounter: Payer: Self-pay | Admitting: Cardiology

## 2008-10-11 ENCOUNTER — Ambulatory Visit: Payer: Self-pay

## 2008-10-11 ENCOUNTER — Ambulatory Visit: Payer: Self-pay | Admitting: Cardiology

## 2008-10-11 ENCOUNTER — Encounter: Payer: Self-pay | Admitting: Cardiology

## 2008-10-13 ENCOUNTER — Ambulatory Visit: Payer: Self-pay | Admitting: Cardiology

## 2008-10-13 DIAGNOSIS — I5043 Acute on chronic combined systolic (congestive) and diastolic (congestive) heart failure: Secondary | ICD-10-CM

## 2008-10-13 DIAGNOSIS — I5022 Chronic systolic (congestive) heart failure: Secondary | ICD-10-CM

## 2008-10-13 HISTORY — DX: Chronic systolic (congestive) heart failure: I50.22

## 2008-10-16 ENCOUNTER — Ambulatory Visit: Payer: Self-pay | Admitting: Cardiology

## 2008-10-16 LAB — CONVERTED CEMR LAB
BUN: 20 mg/dL (ref 6–23)
Creatinine, Ser: 1.6 mg/dL — ABNORMAL HIGH (ref 0.4–1.5)
GFR calc non Af Amer: 44.95 mL/min (ref >60)
Glucose, Bld: 99 mg/dL (ref 70–99)
Potassium: 4 meq/L (ref 3.5–5.1)

## 2008-10-17 ENCOUNTER — Encounter: Payer: Self-pay | Admitting: Cardiology

## 2008-10-17 ENCOUNTER — Ambulatory Visit: Payer: Self-pay | Admitting: Internal Medicine

## 2008-10-17 DIAGNOSIS — N259 Disorder resulting from impaired renal tubular function, unspecified: Secondary | ICD-10-CM | POA: Insufficient documentation

## 2008-10-17 HISTORY — DX: Disorder resulting from impaired renal tubular function, unspecified: N25.9

## 2008-10-19 ENCOUNTER — Telehealth: Payer: Self-pay | Admitting: Cardiology

## 2008-10-26 ENCOUNTER — Ambulatory Visit: Payer: Self-pay | Admitting: Cardiovascular Disease

## 2008-10-26 ENCOUNTER — Inpatient Hospital Stay (HOSPITAL_COMMUNITY): Admission: EM | Admit: 2008-10-26 | Discharge: 2008-11-01 | Payer: Self-pay | Admitting: Emergency Medicine

## 2008-10-30 ENCOUNTER — Encounter (INDEPENDENT_AMBULATORY_CARE_PROVIDER_SITE_OTHER): Payer: Self-pay | Admitting: *Deleted

## 2008-10-31 ENCOUNTER — Encounter: Payer: Self-pay | Admitting: Internal Medicine

## 2008-11-02 ENCOUNTER — Ambulatory Visit: Payer: Self-pay | Admitting: Internal Medicine

## 2008-11-02 LAB — CONVERTED CEMR LAB: POC INR: 1.8

## 2008-11-10 ENCOUNTER — Ambulatory Visit: Payer: Self-pay | Admitting: Internal Medicine

## 2008-11-14 ENCOUNTER — Encounter: Payer: Self-pay | Admitting: Internal Medicine

## 2008-11-14 ENCOUNTER — Ambulatory Visit: Payer: Self-pay | Admitting: Cardiology

## 2008-11-14 ENCOUNTER — Ambulatory Visit: Payer: Self-pay

## 2008-11-14 ENCOUNTER — Ambulatory Visit: Payer: Self-pay | Admitting: Internal Medicine

## 2008-11-14 ENCOUNTER — Ambulatory Visit (HOSPITAL_COMMUNITY): Admission: RE | Admit: 2008-11-14 | Discharge: 2008-11-14 | Payer: Self-pay | Admitting: Internal Medicine

## 2008-11-15 LAB — CONVERTED CEMR LAB
BUN: 22 mg/dL (ref 6–23)
Creatinine, Ser: 1.6 mg/dL — ABNORMAL HIGH (ref 0.4–1.5)
GFR calc non Af Amer: 44.94 mL/min (ref >60)
Glucose, Bld: 81 mg/dL (ref 70–99)
Potassium: 3.6 meq/L (ref 3.5–5.1)

## 2008-11-20 ENCOUNTER — Telehealth (INDEPENDENT_AMBULATORY_CARE_PROVIDER_SITE_OTHER): Payer: Self-pay | Admitting: *Deleted

## 2008-11-20 ENCOUNTER — Ambulatory Visit: Payer: Self-pay | Admitting: Internal Medicine

## 2008-11-20 LAB — CONVERTED CEMR LAB: POC INR: 2.1

## 2008-11-24 ENCOUNTER — Encounter: Payer: Self-pay | Admitting: Family Medicine

## 2008-12-08 ENCOUNTER — Encounter (INDEPENDENT_AMBULATORY_CARE_PROVIDER_SITE_OTHER): Payer: Self-pay | Admitting: *Deleted

## 2008-12-08 ENCOUNTER — Encounter: Payer: Self-pay | Admitting: Internal Medicine

## 2008-12-09 ENCOUNTER — Encounter: Payer: Self-pay | Admitting: Cardiology

## 2008-12-13 ENCOUNTER — Encounter (INDEPENDENT_AMBULATORY_CARE_PROVIDER_SITE_OTHER): Payer: Self-pay | Admitting: *Deleted

## 2008-12-20 ENCOUNTER — Ambulatory Visit: Payer: Self-pay | Admitting: Cardiology

## 2008-12-20 ENCOUNTER — Encounter: Payer: Self-pay | Admitting: Internal Medicine

## 2008-12-20 ENCOUNTER — Ambulatory Visit: Payer: Self-pay | Admitting: Cardiovascular Disease

## 2008-12-21 ENCOUNTER — Encounter: Payer: Self-pay | Admitting: Internal Medicine

## 2008-12-26 ENCOUNTER — Ambulatory Visit: Payer: Self-pay | Admitting: Cardiology

## 2009-01-02 ENCOUNTER — Ambulatory Visit: Payer: Self-pay | Admitting: Cardiology

## 2009-01-03 LAB — CONVERTED CEMR LAB
Basophils Relative: 1.5 % (ref 0.0–3.0)
Calcium: 9.7 mg/dL (ref 8.4–10.5)
Eosinophils Absolute: 0.2 10*3/uL (ref 0.0–0.7)
Eosinophils Relative: 3.1 % (ref 0.0–5.0)
GFR calc non Af Amer: 44.93 mL/min (ref >60)
Hemoglobin: 15.9 g/dL (ref 13.0–17.0)
Lymphocytes Relative: 25.8 % (ref 12.0–46.0)
MCHC: 33.9 g/dL (ref 30.0–36.0)
Monocytes Relative: 15.2 % — ABNORMAL HIGH (ref 3.0–12.0)
Neutrophils Relative %: 54.4 % (ref 43.0–77.0)
Potassium: 4 meq/L (ref 3.5–5.1)
Pro B Natriuretic peptide (BNP): 820 pg/mL — ABNORMAL HIGH (ref 0.0–100.0)
RBC: 5.15 M/uL (ref 4.22–5.81)
Sodium: 142 meq/L (ref 135–145)
WBC: 5.7 10*3/uL (ref 4.5–10.5)

## 2009-01-07 ENCOUNTER — Encounter: Payer: Self-pay | Admitting: Cardiology

## 2009-01-08 ENCOUNTER — Encounter: Payer: Self-pay | Admitting: Cardiology

## 2009-01-10 ENCOUNTER — Encounter: Payer: Self-pay | Admitting: Cardiology

## 2009-01-16 ENCOUNTER — Ambulatory Visit: Payer: Self-pay | Admitting: Cardiovascular Disease

## 2009-01-21 ENCOUNTER — Inpatient Hospital Stay (HOSPITAL_COMMUNITY): Admission: EM | Admit: 2009-01-21 | Discharge: 2009-01-23 | Payer: Self-pay | Admitting: Emergency Medicine

## 2009-01-21 ENCOUNTER — Ambulatory Visit: Payer: Self-pay | Admitting: Internal Medicine

## 2009-01-23 ENCOUNTER — Encounter (INDEPENDENT_AMBULATORY_CARE_PROVIDER_SITE_OTHER): Payer: Self-pay | Admitting: *Deleted

## 2009-01-25 ENCOUNTER — Ambulatory Visit: Payer: Self-pay | Admitting: Cardiology

## 2009-01-29 ENCOUNTER — Ambulatory Visit: Payer: Self-pay | Admitting: Internal Medicine

## 2009-01-30 LAB — CONVERTED CEMR LAB
Chloride: 99 meq/L (ref 96–112)
GFR calc non Af Amer: 41.88 mL/min (ref >60)
Glucose, Bld: 121 mg/dL — ABNORMAL HIGH (ref 70–99)
Potassium: 4 meq/L (ref 3.5–5.1)
Sodium: 135 meq/L (ref 135–145)

## 2009-02-05 ENCOUNTER — Ambulatory Visit: Payer: Self-pay | Admitting: Cardiology

## 2009-02-05 DIAGNOSIS — R21 Rash and other nonspecific skin eruption: Secondary | ICD-10-CM

## 2009-02-06 ENCOUNTER — Ambulatory Visit: Payer: Self-pay | Admitting: Internal Medicine

## 2009-02-06 ENCOUNTER — Ambulatory Visit: Payer: Self-pay | Admitting: Cardiology

## 2009-02-06 ENCOUNTER — Encounter: Payer: Self-pay | Admitting: Internal Medicine

## 2009-02-06 LAB — CONVERTED CEMR LAB: POC INR: 2.1

## 2009-02-08 LAB — CONVERTED CEMR LAB
Chloride: 100 meq/L (ref 96–112)
GFR calc non Af Amer: 44.92 mL/min (ref >60)
Glucose, Bld: 100 mg/dL — ABNORMAL HIGH (ref 70–99)
Potassium: 4.1 meq/L (ref 3.5–5.1)
Pro B Natriuretic peptide (BNP): 476 pg/mL — ABNORMAL HIGH (ref 0.0–100.0)
Sodium: 140 meq/L (ref 135–145)

## 2009-02-14 ENCOUNTER — Ambulatory Visit: Payer: Self-pay | Admitting: Family Medicine

## 2009-02-19 ENCOUNTER — Telehealth: Payer: Self-pay | Admitting: Internal Medicine

## 2009-03-02 ENCOUNTER — Telehealth: Payer: Self-pay | Admitting: Family Medicine

## 2009-03-05 ENCOUNTER — Ambulatory Visit: Payer: Self-pay | Admitting: Cardiology

## 2009-03-05 LAB — CONVERTED CEMR LAB: POC INR: 1.6

## 2009-03-15 ENCOUNTER — Ambulatory Visit: Payer: Self-pay | Admitting: Cardiology

## 2009-03-23 ENCOUNTER — Telehealth: Payer: Self-pay | Admitting: Family Medicine

## 2009-04-09 ENCOUNTER — Encounter: Payer: Self-pay | Admitting: Cardiology

## 2009-04-09 ENCOUNTER — Ambulatory Visit: Payer: Self-pay | Admitting: Internal Medicine

## 2009-04-12 ENCOUNTER — Encounter: Payer: Self-pay | Admitting: Cardiology

## 2009-05-07 ENCOUNTER — Encounter: Payer: Self-pay | Admitting: Cardiology

## 2009-05-07 ENCOUNTER — Ambulatory Visit: Payer: Self-pay

## 2009-05-07 ENCOUNTER — Ambulatory Visit: Payer: Self-pay | Admitting: Cardiovascular Disease

## 2009-05-09 ENCOUNTER — Telehealth (INDEPENDENT_AMBULATORY_CARE_PROVIDER_SITE_OTHER): Payer: Self-pay | Admitting: *Deleted

## 2009-05-13 ENCOUNTER — Encounter: Payer: Self-pay | Admitting: Cardiology

## 2009-05-14 ENCOUNTER — Ambulatory Visit: Payer: Self-pay

## 2009-05-14 ENCOUNTER — Ambulatory Visit: Payer: Self-pay | Admitting: Cardiology

## 2009-05-14 ENCOUNTER — Ambulatory Visit (HOSPITAL_COMMUNITY): Admission: RE | Admit: 2009-05-14 | Discharge: 2009-05-14 | Payer: Self-pay | Admitting: Cardiology

## 2009-05-14 ENCOUNTER — Encounter: Payer: Self-pay | Admitting: Cardiology

## 2009-05-28 LAB — CONVERTED CEMR LAB
Albumin: 4.4 g/dL (ref 3.5–5.2)
BUN: 23 mg/dL (ref 6–23)
Calcium: 9.6 mg/dL (ref 8.4–10.5)
Cholesterol: 134 mg/dL (ref 0–200)
GFR calc non Af Amer: 36.81 mL/min (ref >60)
Glucose, Bld: 102 mg/dL — ABNORMAL HIGH (ref 70–99)
HDL: 36.1 mg/dL — ABNORMAL LOW (ref >39.00)
Sodium: 141 meq/L (ref 135–145)
VLDL: 33.2 mg/dL (ref 0.0–40.0)

## 2009-06-01 ENCOUNTER — Encounter: Payer: Self-pay | Admitting: Family Medicine

## 2009-06-04 ENCOUNTER — Ambulatory Visit: Payer: Self-pay | Admitting: Cardiology

## 2009-06-07 ENCOUNTER — Ambulatory Visit: Payer: Self-pay | Admitting: Family Medicine

## 2009-06-13 ENCOUNTER — Encounter: Payer: Self-pay | Admitting: Cardiology

## 2009-06-15 ENCOUNTER — Encounter: Payer: Self-pay | Admitting: Internal Medicine

## 2009-07-09 ENCOUNTER — Ambulatory Visit: Payer: Self-pay | Admitting: Internal Medicine

## 2009-07-17 ENCOUNTER — Encounter: Payer: Self-pay | Admitting: Family Medicine

## 2009-07-20 ENCOUNTER — Encounter: Payer: Self-pay | Admitting: Family Medicine

## 2009-07-20 ENCOUNTER — Encounter: Payer: Self-pay | Admitting: Internal Medicine

## 2009-07-26 ENCOUNTER — Encounter: Payer: Self-pay | Admitting: Internal Medicine

## 2009-07-26 ENCOUNTER — Encounter: Payer: Self-pay | Admitting: Family Medicine

## 2009-08-06 ENCOUNTER — Ambulatory Visit: Payer: Self-pay | Admitting: Internal Medicine

## 2009-08-07 ENCOUNTER — Encounter: Payer: Self-pay | Admitting: Internal Medicine

## 2009-08-10 ENCOUNTER — Ambulatory Visit: Payer: Self-pay | Admitting: Cardiovascular Disease

## 2009-08-17 ENCOUNTER — Ambulatory Visit: Payer: Self-pay | Admitting: Cardiology

## 2009-08-22 ENCOUNTER — Encounter: Payer: Self-pay | Admitting: Internal Medicine

## 2009-08-24 ENCOUNTER — Ambulatory Visit: Payer: Self-pay | Admitting: Cardiology

## 2009-08-28 ENCOUNTER — Encounter: Payer: Self-pay | Admitting: Cardiology

## 2009-09-14 ENCOUNTER — Encounter: Payer: Self-pay | Admitting: Cardiology

## 2009-09-21 ENCOUNTER — Ambulatory Visit: Payer: Self-pay | Admitting: Cardiology

## 2009-09-21 LAB — CONVERTED CEMR LAB: POC INR: 2.9

## 2009-10-15 ENCOUNTER — Encounter: Payer: Self-pay | Admitting: Cardiology

## 2009-10-15 ENCOUNTER — Telehealth: Payer: Self-pay | Admitting: Family Medicine

## 2009-10-19 ENCOUNTER — Ambulatory Visit: Payer: Self-pay | Admitting: Internal Medicine

## 2009-10-19 LAB — CONVERTED CEMR LAB: POC INR: 2.2

## 2009-11-06 ENCOUNTER — Ambulatory Visit: Payer: Self-pay | Admitting: Internal Medicine

## 2009-11-14 ENCOUNTER — Encounter: Payer: Self-pay | Admitting: Cardiology

## 2009-11-15 ENCOUNTER — Ambulatory Visit: Payer: Self-pay | Admitting: Cardiology

## 2009-11-15 ENCOUNTER — Ambulatory Visit: Payer: Self-pay | Admitting: Internal Medicine

## 2009-11-15 ENCOUNTER — Encounter: Payer: Self-pay | Admitting: Cardiology

## 2009-11-15 DIAGNOSIS — I6529 Occlusion and stenosis of unspecified carotid artery: Secondary | ICD-10-CM

## 2009-11-15 HISTORY — DX: Occlusion and stenosis of unspecified carotid artery: I65.29

## 2009-11-19 LAB — CONVERTED CEMR LAB
GFR calc non Af Amer: 43.26 mL/min (ref >60)
Potassium: 3.9 meq/L (ref 3.5–5.1)
Sodium: 140 meq/L (ref 135–145)

## 2009-12-06 ENCOUNTER — Ambulatory Visit: Payer: Self-pay

## 2009-12-06 ENCOUNTER — Encounter: Payer: Self-pay | Admitting: Cardiology

## 2009-12-13 ENCOUNTER — Ambulatory Visit: Payer: Self-pay | Admitting: Cardiology

## 2009-12-13 LAB — CONVERTED CEMR LAB: POC INR: 3

## 2009-12-15 ENCOUNTER — Encounter: Payer: Self-pay | Admitting: Cardiology

## 2010-01-10 ENCOUNTER — Ambulatory Visit: Payer: Self-pay | Admitting: Internal Medicine

## 2010-01-15 ENCOUNTER — Encounter: Payer: Self-pay | Admitting: Cardiology

## 2010-02-07 ENCOUNTER — Encounter: Payer: Self-pay | Admitting: Internal Medicine

## 2010-02-07 ENCOUNTER — Ambulatory Visit
Admission: RE | Admit: 2010-02-07 | Discharge: 2010-02-07 | Payer: Self-pay | Source: Home / Self Care | Attending: Internal Medicine | Admitting: Internal Medicine

## 2010-02-08 ENCOUNTER — Encounter: Payer: Self-pay | Admitting: Cardiology

## 2010-02-08 ENCOUNTER — Ambulatory Visit
Admission: RE | Admit: 2010-02-08 | Discharge: 2010-02-08 | Payer: Self-pay | Source: Home / Self Care | Attending: Cardiology | Admitting: Cardiology

## 2010-02-08 ENCOUNTER — Other Ambulatory Visit: Payer: Self-pay | Admitting: Cardiology

## 2010-02-08 ENCOUNTER — Ambulatory Visit: Admission: RE | Admit: 2010-02-08 | Discharge: 2010-02-08 | Payer: Self-pay | Source: Home / Self Care

## 2010-02-08 LAB — BASIC METABOLIC PANEL
BUN: 24 mg/dL — ABNORMAL HIGH (ref 6–23)
CO2: 27 mEq/L (ref 19–32)
Calcium: 9.3 mg/dL (ref 8.4–10.5)
Chloride: 100 mEq/L (ref 96–112)
Creatinine, Ser: 1.8 mg/dL — ABNORMAL HIGH (ref 0.4–1.5)
GFR: 39.87 mL/min — ABNORMAL LOW (ref >60.00)
Glucose, Bld: 101 mg/dL — ABNORMAL HIGH (ref 70–99)
Potassium: 3.7 mEq/L (ref 3.5–5.1)
Sodium: 139 mEq/L (ref 135–145)

## 2010-03-01 ENCOUNTER — Encounter: Payer: Self-pay | Admitting: Cardiology

## 2010-03-03 LAB — CONVERTED CEMR LAB
BUN: 24 mg/dL — ABNORMAL HIGH (ref 6–23)
Basophils Absolute: 0.3 10*3/uL — ABNORMAL HIGH (ref 0.0–0.1)
Chloride: 102 meq/L (ref 96–112)
Creatinine, Ser: 1.8 mg/dL — ABNORMAL HIGH (ref 0.4–1.5)
GFR calc non Af Amer: 39.22 mL/min (ref >60)
Glucose, Bld: 119 mg/dL — ABNORMAL HIGH (ref 70–99)
HCT: 47.2 % (ref 39.0–52.0)
Lymphs Abs: 1.7 10*3/uL (ref 0.7–4.0)
MCV: 90.7 fL (ref 78.0–100.0)
Monocytes Absolute: 0.9 10*3/uL (ref 0.1–1.0)
Neutrophils Relative %: 50.9 % (ref 43.0–77.0)
Platelets: 132 10*3/uL — ABNORMAL LOW (ref 150.0–400.0)
Potassium: 3.9 meq/L (ref 3.5–5.1)
RDW: 14.4 % (ref 11.5–14.6)

## 2010-03-05 NOTE — Progress Notes (Signed)
Summary: refill fenofibrate X 1 year  Phone Note Refill Request Message from:  Fax from Pharmacy  Refills Requested: Medication #1:  FENOFIBRATE 160 MG TABS once daily [facility] [phone]    fax---[phone]  Initial call taken by: Warnell Forester,  March 23, 2009 9:24 AM    Prescriptions: FENOFIBRATE 160 MG TABS (FENOFIBRATE) once daily  #90 x 3   Entered by:   Sid Falcon LPN   Authorized by:   Evelena Peat MD   Signed by:   Sid Falcon LPN on 02/72/5366   Method used:   Electronically to        [facility]* (retail)       [address] [place], [place]  [address]       Ph: [phone] or [phone]       Fax: [phone]   RxID:   [id number]

## 2010-03-05 NOTE — Assessment & Plan Note (Signed)
Summary: ROV   Visit Type:  Follow-up Primary Provider:  Evelena Peat MD  CC:  weakness and cough.  History of Present Illness: Patient has been out of the hospital now about ten days.  The medications are bothering his stomach.  Medications were changed.  Has not been able to get his strength back since he was in the hospital..  He has had a continued cough on lisinopril.  Of note, the patient had cough previously on vasotec.  They also cut back on his lasix.  His ACE and ARB were stopped in past, the first due to cough, and the second associated with rise in creatinine, which occurred on both.  Not clear that the cr was beyond control.  Renal artery ultrasound was performed, and revealed some increase in velocities on the right, but normal left.  This occurred in 2007.  Recently restarted, with Cr as noted in chart.   Has a small rash over back.  Current Medications (verified): 1)  Warfarin Sodium 5 Mg Tabs (Warfarin Sodium) .... As Directed 2)  Omeprazole 20 Mg Cpdr (Omeprazole) .... One By Mouth Daily 3)  Furosemide 40 Mg Tabs (Furosemide) .... Take One Twice Daily 4)  Fenofibrate 160 Mg Tabs (Fenofibrate) .... Once Daily 5)  Simvastatin 40 Mg Tabs (Simvastatin) .... Once Daily At [facility] 6)  Flomax 0.4 Mg Cp24 (Tamsulosin Hcl) .... Take 1 Capsule By Mouth Nightly 7)  Imdur 60 Mg Xr24h-Tab (Isosorbide Mononitrate) .... Qd 8)  Nitroglycerin 0.4 Mg Subl (Nitroglycerin) .... One Tablet Under Tongue Every 5 Minutes As Needed For Chest Pain---May Repeat Times Three 9)  Stool Softener 100 Mg Caps (Docusate Sodium) .... As Needed 10)  Mucinex Dm 30-600 Mg Xr12h-Tab (Dextromethorphan-Guaifenesin) .... As Needed 11)  Tylenol Extra Strength 500 Mg Tabs (Acetaminophen) .... As Needed 12)  Nasacort Aq 55 Mcg/act Aers (Triamcinolone Acetonide(Nasal)) .... As Needed 13)  Aspirin 325 Mg Tabs (Aspirin) .... Take One Tablet Daily 14)  Alprazolam 0.25 Mg Tabs (Alprazolam) .... Take Daily As Needed 15)   Carvedilol 3.125 Mg Tabs (Carvedilol) .... Take One Twice Daily 16)  Lisinopril 5 Mg Tabs (Lisinopril) .... Take One Twice Daily 17)  Loperamide Hcl 2 Mg Caps (Loperamide Hcl) .... Take As Needed  Allergies: 1)  ! Penicillin V Potassium (Penicillin V Potassium) 2)  ! * Cloth Tape 3)  ! Soma 4)  ! Zolpidem Tartrate  Vital Signs:  Patient profile:   77 year old male Height:      73 inches Weight:      198 pounds Pulse rate:   72 / minute Pulse rhythm:   regular BP sitting:   118 / 60  (left arm)  Vitals Entered By: Jacquelin Hawking, CMA (February 05, 2009 11:31 AM)  Physical Exam  General:  Well developed, well nourished, in no acute distress. Lungs:  Clear bilaterally to auscultation and percussion. Heart:  Normal S1 and S2.  Apical murmur and LSE murmur consistent with MR and TR.   Abdomen:  Bowel sounds positive; abdomen soft and non-tender without masses, organomegaly, or hernias noted. No hepatosplenomegaly. Msk:  Back normal, normal gait. Muscle strength and tone normal. Extremities:  No clubbing or cyanosis.  No edema except trace on the LLE. Neurologic:  Alert and oriented x 3. Skin:  Has small rash to L on lumbar spine about L3-L5.      ICD Specifications Following MD:  Sherryl Manges, MD     Referring MD:  [name] ICD Vendor:  [address]  [place]     ICD Model Number:  BM8413-24     ICD Serial Number:  [id number] ICD DOI:  10/30/2008     ICD Implanting MD:  Sherryl Manges, MD  Lead 1:    Location: RV     DOI: 03/17/2005     Model #: [id number]     Serial #: [id number]     Status: active Lead 2:    Location: RA     DOI: 10/30/2008     Model #: [id number]     Serial #: [id number]     Status: active Lead 3:    Location: LV     DOI: 10/30/2008     Model #: [id number]     Serial #: [id number]     Status: active  Indications::  ICM  Explantation Comments: 10/30/2008 [employer] [id number] explanted  ICD Follow Up ICD Dependent:  No       ICD Device Measurements Configuration: LV TIP TO RV  COIL  Episodes Coumadin:  No  Brady Parameters Mode DDD     Lower Rate Limit:  60     Upper Rate Limit 120 PAV 160     Sensed AV Delay:  140  Tachy Zones VF:  240     VT:  214     VT1:  171     Impression & Recommendations:  Problem # 1:  CHRONIC SYSTOLIC HEART FAILURE (ICD-428.22)  Some component of MR and TR.  Symptomatic class III.  Also, ACE and ARB stopped in 2007, and reinstituted, so will need to keep close eye on his creatinine.  Will followup with a month. His updated medication list for this problem includes:    Warfarin Sodium 5 Mg Tabs (Warfarin sodium) .Marland Kitchen... As directed    Furosemide 40 Mg Tabs (Furosemide) .Marland Kitchen... Take one twice daily    Imdur 60 Mg Xr24h-tab (Isosorbide mononitrate) ..... Qd    Nitroglycerin 0.4 Mg Subl (Nitroglycerin) ..... One tablet under tongue every 5 minutes as needed for chest pain---may repeat times three    Aspirin 325 Mg Tabs (Aspirin) .Marland Kitchen... Take one tablet daily    Carvedilol 3.125 Mg Tabs (Carvedilol) .Marland Kitchen... Take one twice daily    Lisinopril 5 Mg Tabs (Lisinopril) .Marland Kitchen... Take one twice daily  Orders: TLB-BMP (Basic Metabolic Panel-BMET) (80048-METABOL) TLB-BNP (B-Natriuretic Peptide) (83880-BNPR)  Problem # 2:  CARDIOMYOPATHY, ISCHEMIC (ICD-414.8) see above His updated medication list for this problem includes:    Warfarin Sodium 5 Mg Tabs (Warfarin sodium) .Marland Kitchen... As directed    Furosemide 40 Mg Tabs (Furosemide) .Marland Kitchen... Take one twice daily    Imdur 60 Mg Xr24h-tab (Isosorbide mononitrate) ..... Qd    Nitroglycerin 0.4 Mg Subl (Nitroglycerin) ..... One tablet under tongue every 5 minutes as needed for chest pain---may repeat times three    Aspirin 325 Mg Tabs (Aspirin) .Marland Kitchen... Take one tablet daily    Carvedilol 3.125 Mg Tabs (Carvedilol) .Marland Kitchen... Take one twice daily    Lisinopril 5 Mg Tabs (Lisinopril) .Marland Kitchen... Take one twice daily  Problem # 3:  RENAL INSUFFICIENCY (ICD-588.9) Recheck renal function today.  Problem # 4:  RASH AND OTHER  NONSPECIFIC SKIN ERUPTION (ICD-782.1) Small rash over back.  If it gets more painful, then see Dr. Caryl Never promptly.   Patient Instructions: 1)  Your physician recommends that you schedule a follow-up appointment in: 1 MONTH 2)  Your physician recommends that you have lab work today: BMP and BNP 3)  Your physician recommends that you continue on  your current medications as directed. Please refer to the Current Medication list given to you today.

## 2010-03-05 NOTE — Medication Information (Signed)
Summary: rov/ln  Anticoagulant Therapy  Managed by: Weston Brass, PharmD Referring MD: [name] MD PCP: Evelena Peat MD Supervising MD: Shirlee Latch MD, Freida Busman Indication 1: Atrial Fibrillation (ICD-427.31) Indication 2: deep vein thrombosis--arm (ICD-451.19) Lab Used: [facility] Site: [facility] INR POC 2.9 INR RANGE 2 - 3  Dietary changes: no    Health status changes: no    Bleeding/hemorrhagic complications: no    Recent/future hospitalizations: no    Any changes in medication regimen? no    Recent/future dental: no  Any missed doses?: no       Is patient compliant with meds? yes       Allergies: 1)  ! Penicillin V Potassium (Penicillin V Potassium) 2)  ! * Cloth Tape 3)  ! Soma 4)  ! Zolpidem Tartrate 5)  ! * Lisinopril  Anticoagulation Management History:      The patient is taking warfarin and comes in today for a routine follow up visit.  Positive risk factors for bleeding include an age of 1 years or older and presence of serious comorbidities.  The bleeding index is 'intermediate risk'.  Positive CHADS2 values include History of CHF, History of HTN, and Age > 31 years old.  The start date was 11/17/2005.  His last INR was 2.1.  Anticoagulation responsible provider: Shirlee Latch MD, Marlos Carmen.  INR POC: 2.9.  Cuvette Lot#: [id number].  Exp: 11/2010.    Anticoagulation Management Assessment/Plan:      The patient's current anticoagulation dose is Warfarin sodium 5 mg tabs: as directed.  The target INR is 2 - 3.  The next INR is due 10/19/2009.  Anticoagulation instructions were given to patient.  Results were reviewed/authorized by Weston Brass, PharmD.  He was notified by Weston Brass PharmD.         Prior Anticoagulation Instructions: INR 2.3  Continue same dose of 1/2 tab on Sunday and Thursday and 1 tab every other day.  Re-check in 4 weeks.  Current Anticoagulation Instructions: INR 2.9  Continue same dose of 1 tablet every day except 1/2 tablet on Sunday and  Thursday.  Recheck INR in 4 weeks.

## 2010-03-05 NOTE — Assessment & Plan Note (Signed)
Summary: Sean [name]   Visit Type:  3 months follow up Primary Provider:  Evelena Peat MD  CC:  Chest pain relief taking nitrog.  History of Present Illness: Patient has had some exertional symptoms for quite some time.  He will have to sit down when he comes back from walking.  Mostly this occurs on going up the hill.  Taking a nitro, he gets no pain.  Noted he coulod not feel his left carotid.     Current Medications (verified): 1)  Warfarin Sodium 5 Mg Tabs (Warfarin Sodium) .... As Directed 2)  Omeprazole 20 Mg Cpdr (Omeprazole) .... One By Mouth Two Times A Day 3)  Furosemide 40 Mg Tabs (Furosemide) .... Take One Twice Daily 4)  Fenofibrate 160 Mg Tabs (Fenofibrate) .... Once Daily 5)  Simvastatin 40 Mg Tabs (Simvastatin) .... Once Daily At [facility] 6)  Flomax 0.4 Mg Cp24 (Tamsulosin Hcl) .... Take 1 Capsule By Mouth Nightly 7)  Isosorbide Mononitrate Cr 60 Mg Xr24h-Tab (Isosorbide Mononitrate) .... Take One Tablet By Mouth Daily 8)  Nitroglycerin 0.4 Mg Subl (Nitroglycerin) .... One Tablet Under Tongue Every 5 Minutes As Needed For Chest Pain---May Repeat Times Three 9)  Stool Softener 100 Mg Caps (Docusate Sodium) .... As Needed 10)  Tylenol Extra Strength 500 Mg Tabs (Acetaminophen) .... As Needed 11)  Nasacort Aq 55 Mcg/act Aers (Triamcinolone Acetonide(Nasal)) .... As Needed 12)  Aspirin 81 Mg Tbec (Aspirin) .... Take One Tablet By Mouth Daily 13)  Alprazolam 0.25 Mg Tabs (Alprazolam) .... Take Daily As Needed 14)  Carvedilol 3.125 Mg Tabs (Carvedilol) .... Take One Twice Daily 15)  Losartan Potassium 50 Mg Tabs (Losartan Potassium) .... Take 1/2 Tablet Daily 16)  Vitamin B-12 500 Mcg Tabs (Cyanocobalamin) .... Take 1 Tablet By Mouth Once A Day  Allergies: 1)  ! Penicillin V Potassium (Penicillin V Potassium) 2)  ! * Cloth Tape 3)  ! Soma 4)  ! Zolpidem Tartrate 5)  ! * Lisinopril  Vital Signs:  Patient profile:   77 year old male Height:      73 inches Weight:      207.50  pounds BMI:     27.48 Pulse rate:   74 / minute Pulse rhythm:   regular Resp:     18 per minute BP sitting:   120 / 60  (left arm) Cuff size:   large  Vitals Entered By: Vikki Ports (November 15, 2009 11:46 AM)  Physical Exam  General:  Well developed, well nourished, in no acute distress. Head:  normocephalic and atraumatic Eyes:  PERRLA/EOM intact; conjunctiva and lids normal. Neck:  Carotid R palpable, left slightly less.  Lungs:  Clear bilaterally to auscultation and percussion. Heart:  Normal S1 and S2.  Apical murmur consistent with mr.  No rub. Abdomen:  Bowel sounds positive; abdomen soft and non-tender without masses, organomegaly, or hernias noted. No hepatosplenomegaly. Pulses:  pulses normal in all 4 extremities Extremities:  No clubbing or cyanosis. Neurologic:  Alert and oriented x 3.   EKG  Procedure date:  11/15/2009  Findings:      Atrial track, vpace with PVCs   ICD Specifications Following MD:  Sherryl Manges, MD     Referring MD:  [facility] ICD Vendor:  [facility]     ICD Model Number:  [id number]     ICD Serial Number:  [id number] ICD DOI:  10/30/2008     ICD Implanting MD:  Sherryl Manges, MD  Lead 1:    Location: RV  DOI: 03/17/2005     Model #: [id number]     Serial #: [id number]     Status: active Lead 2:    Location: RA     DOI: 10/30/2008     Model #: [id number]     Serial #: [id number]     Status: active Lead 3:    Location: LV     DOI: 10/30/2008     Model #: [id number]     Serial #: [id number]     Status: active  Indications::  ICM  Explantation Comments: 10/30/2008 [employer] [id number] explanted  ICD Follow Up ICD Dependent:  No       ICD Device Measurements Configuration: LV TIP TO RV COIL  Episodes Coumadin:  Yes  Brady Parameters Mode DDD     Lower Rate Limit:  60     Upper Rate Limit 120 PAV 160     Sensed AV Delay:  140  Tachy Zones VF:  240     VT:  214     VT1:  171     Impression & Recommendations:  Problem # 1:  CHRONIC SYSTOLIC HEART  FAILURE (ICD-428.22) Hemodynamics seem pretty good.  Combined myopathy, with some MR.  Volume status ok at this point His updated medication list for this problem includes:    Warfarin Sodium 5 Mg Tabs (Warfarin sodium) .Marland Kitchen... As directed    Furosemide 40 Mg Tabs (Furosemide) .Marland Kitchen... Take one twice daily    Isosorbide Mononitrate Cr 60 Mg Xr24h-tab (Isosorbide mononitrate) .Marland Kitchen... Take one tablet by mouth daily    Nitroglycerin 0.4 Mg Subl (Nitroglycerin) ..... One tablet under tongue every 5 minutes as needed for chest pain---may repeat times three    Aspirin 81 Mg Tbec (Aspirin) .Marland Kitchen... Take one tablet by mouth daily    Carvedilol 3.125 Mg Tabs (Carvedilol) .Marland Kitchen... Take one twice daily    Losartan Potassium 50 Mg Tabs (Losartan potassium) .Marland Kitchen... Take 1/2 tablet daily  Orders: EKG w/ Interpretation (93000)  Problem # 2:  CAD, ARTERY BYPASS GRAFT (ICD-414.04) Modest angina, but controlled with meds.  Discussed exercise routine His updated medication list for this problem includes:    Warfarin Sodium 5 Mg Tabs (Warfarin sodium) .Marland Kitchen... As directed    Isosorbide Mononitrate Cr 60 Mg Xr24h-tab (Isosorbide mononitrate) .Marland Kitchen... Take one tablet by mouth daily    Nitroglycerin 0.4 Mg Subl (Nitroglycerin) ..... One tablet under tongue every 5 minutes as needed for chest pain---may repeat times three    Aspirin 81 Mg Tbec (Aspirin) .Marland Kitchen... Take one tablet by mouth daily    Carvedilol 3.125 Mg Tabs (Carvedilol) .Marland Kitchen... Take one twice daily  Orders: EKG w/ Interpretation (93000)  Problem # 3:  CAROTID ARTERY STENOSIS (ICD-433.10) Slightly less palpable carotid.  Will check dopplers. His updated medication list for this problem includes:    Warfarin Sodium 5 Mg Tabs (Warfarin sodium) .Marland Kitchen... As directed    Aspirin 81 Mg Tbec (Aspirin) .Marland Kitchen... Take one tablet by mouth daily  Orders: Carotid Duplex (Carotid Duplex)  Problem # 4:  HYPERLIPIDEMIA (ICD-272.4) On combined therapy His updated medication list for this  problem includes:    Fenofibrate 160 Mg Tabs (Fenofibrate) ..... Once daily    Simvastatin 40 Mg Tabs (Simvastatin) ..... Once daily at hs  Other Orders: TLB-BMP (Basic Metabolic Panel-BMET) (80048-METABOL)  Patient Instructions: 1)  Your physician recommends that you schedule a follow-up appointment in: 3 MONTHS 2)  Your physician recommends that you have lab work today: BMP 3)  Your  physician has requested that you have a carotid duplex. This test is an ultrasound of the carotid arteries in your neck. It looks at blood flow through these arteries that supply the brain with blood. Allow one hour for this exam. There are no restrictions or special instructions.

## 2010-03-05 NOTE — Medication Information (Signed)
Summary: rov/tm  Anticoagulant Therapy  Managed by: Bethena Midget, RN, BSN Referring MD: [name] MD PCP: Evelena Peat MD Supervising MD: Daleen Squibb MD, Maisie Fus Indication 1: Atrial Fibrillation (ICD-427.31) Indication 2: deep vein thrombosis--arm (ICD-451.19) Lab Used: [facility] Site: [facility] INR POC 2.2 INR RANGE 2 - 3  Dietary changes: no    Health status changes: no    Bleeding/hemorrhagic complications: no    Recent/future hospitalizations: no    Any changes in medication regimen? no    Recent/future dental: no  Any missed doses?: no       Is patient compliant with meds? yes       Allergies: 1)  ! Penicillin V Potassium (Penicillin V Potassium) 2)  ! * Cloth Tape 3)  ! Soma 4)  ! Zolpidem Tartrate  Anticoagulation Management History:      The patient is taking warfarin and comes in today for a routine follow up visit.  Positive risk factors for bleeding include an age of 75 years or older and presence of serious comorbidities.  The bleeding index is 'intermediate risk'.  Positive CHADS2 values include History of CHF, History of HTN, and Age > 13 years old.  The start date was 11/17/2005.  His last INR was 2.1.  Anticoagulation responsible provider: Daleen Squibb MD, Maisie Fus.  INR POC: 2.2.  Cuvette Lot#: [id number].  Exp: 05/2010.    Anticoagulation Management Assessment/Plan:      The patient's current anticoagulation dose is Warfarin sodium 5 mg tabs: as directed.  The target INR is 2 - 3.  The next INR is due 04/09/2009.  Anticoagulation instructions were given to patient.  Results were reviewed/authorized by Bethena Midget, RN, BSN.  He was notified by Bethena Midget, RN, BSN.         Prior Anticoagulation Instructions: INR 1.6 Today take 7.5mg s then change dose to 5mg s everyday except 2.5mg s on Tuesdays, Thursdays and Sundays. Recheck in 2 weeks.   Current Anticoagulation Instructions: INR 2.2 Continue 5mg s everyday except 2.5mg s on Tuesdays, Thursdays, and  Sundays. Recheck in 4 weeks.

## 2010-03-05 NOTE — Cardiovascular Report (Signed)
Summary: Office Visit   Office Visit   Imported By: Roderic Ovens 02/15/2009 10:35:46  _____________________________________________________________________  External Attachment:    Type:   Image     Comment:   External Document

## 2010-03-05 NOTE — Letter (Signed)
Summary: [employer] - Health Management Program  [facility] - Health Management Program   Imported By: Marylou Mccoy 08/10/2009 13:03:22  _____________________________________________________________________  External Attachment:    Type:   Image     Comment:   External Document

## 2010-03-05 NOTE — Progress Notes (Signed)
Summary: transmitter question  Phone Note Call from Patient Call back at Home Phone [phone]   Caller: Patient Reason for Call: Talk to Nurse Summary of Call: is the device transmitter suppose to be on at all times Initial call taken by: Migdalia Dk,  February 19, 2009 1:38 PM  Follow-up for Phone Call        Pt advised to leave hooked up all the time. Gypsy Balsam RN BSN  February 19, 2009 3:31 PM

## 2010-03-05 NOTE — Letter (Signed)
Summary: [facility] Patient Note   [facility] Patient Note   Imported By: Roderic Ovens 08/15/2009 10:06:17  _____________________________________________________________________  External Attachment:    Type:   Image     Comment:   External Document

## 2010-03-05 NOTE — Cardiovascular Report (Signed)
Summary: Health Management Program Summary Report   Health Management Program Summary Report   Imported By: Roderic Ovens 08/03/2009 14:49:02  _____________________________________________________________________  External Attachment:    Type:   Image     Comment:   External Document

## 2010-03-05 NOTE — Procedures (Signed)
Summary: device/saf   Current Medications (verified): 1)  Warfarin Sodium 5 Mg Tabs (Warfarin Sodium) .... As Directed 2)  Omeprazole 20 Mg Cpdr (Omeprazole) .... One By Mouth Daily 3)  Furosemide 40 Mg Tabs (Furosemide) .... Take One Twice Daily 4)  Fenofibrate 160 Mg Tabs (Fenofibrate) .... Once Daily 5)  Simvastatin 40 Mg Tabs (Simvastatin) .... Once Daily At [facility] 6)  Flomax 0.4 Mg Cp24 (Tamsulosin Hcl) .... Take 1 Capsule By Mouth Nightly 7)  Isosorbide Mononitrate Cr 60 Mg Xr24h-Tab (Isosorbide Mononitrate) .... Take One Tablet By Mouth Daily 8)  Nitroglycerin 0.4 Mg Subl (Nitroglycerin) .... One Tablet Under Tongue Every 5 Minutes As Needed For Chest Pain---May Repeat Times Three 9)  Stool Softener 100 Mg Caps (Docusate Sodium) .... As Needed 10)  Tylenol Extra Strength 500 Mg Tabs (Acetaminophen) .... As Needed 11)  Nasacort Aq 55 Mcg/act Aers (Triamcinolone Acetonide(Nasal)) .... As Needed 12)  Aspirin 81 Mg Tbec (Aspirin) .... Take One Tablet By Mouth Daily 13)  Alprazolam 0.25 Mg Tabs (Alprazolam) .... Take Daily As Needed 14)  Carvedilol 3.125 Mg Tabs (Carvedilol) .... Take One Twice Daily 15)  Lisinopril 5 Mg Tabs (Lisinopril) .... Take One Twice Daily 16)  Loperamide Hcl 2 Mg Caps (Loperamide Hcl) .... Take As Needed  Allergies (verified): 1)  ! Penicillin V Potassium (Penicillin V Potassium) 2)  ! * Cloth Tape 3)  ! Soma 4)  ! Zolpidem Tartrate    ICD Specifications Following MD:  Sherryl Manges, MD     Referring MD:  [facility] ICD Vendor:  [employer] Jude     ICD Model Number:  [id number]     ICD Serial Number:  [id number] ICD DOI:  10/30/2008     ICD Implanting MD:  Sherryl Manges, MD  Lead 1:    Location: RV     DOI: 03/17/2005     Model #: [id number]     Serial #: [id number]     Status: active Lead 2:    Location: RA     DOI: 10/30/2008     Model #: [id number]     Serial #: [id number]     Status: active Lead 3:    Location: LV     DOI: 10/30/2008     Model #: [id number]     Serial #: [id number]      Status: active  Indications::  ICM  Explantation Comments: 10/30/2008 [employer] [id number] explanted  ICD Follow Up Remote Check?  No Charge Time:  9.0 seconds     Battery Est. Longevity:  3.9 years Underlying rhythm:  SR ICD Dependent:  No       ICD Device Measurements Atrium:  Amplitude: 3.6 mV, Impedance: 480 ohms, Threshold: 1.75 V at 0.5 msec Right Ventricle:  Amplitude: 11.2 mV, Impedance: 450 ohms, Threshold: 0.75 V at 0.5 msec Left Ventricle:  Impedance: 480 ohms, Threshold: 2.25 V at 1.0 msec Configuration: LV TIP TO RV COIL  Episodes MS Episodes:  0     Percent Mode Switch:  0     Coumadin:  Yes Shock:  0     ATP:  0     Nonsustained:  0     Atrial Pacing:  <1%     Ventricular Pacing:  97%  Brady Parameters Mode DDD     Lower Rate Limit:  60     Upper Rate Limit 120 PAV 160     Sensed AV Delay:  140  Tachy Zones VF:  240  VT:  214     VT1:  171     Next Remote Date:  08/06/2009     Next Cardiology Appt Due:  10/04/2009 Tech Comments:  LV reprogrammed 3.5@1 .0.  Device function normal. Merlin transmissions every 3 months.  ROV 9/11 with Dr. Graciela Husbands. Altha Harm, LPN  May 07, 1608 10:35 AM

## 2010-03-05 NOTE — Assessment & Plan Note (Signed)
Summary: 3 month rov   Visit Type:  3 months follow up Primary Provider:  Evelena Peat MD  CC:  No complains.  History of Present Illness: Sean [name] is in for followup.  He is really doing quite well and his functional status lately, particularly with his breathing, has seemingly improved.  Denies any chest pain.  Feels well.  Class II at present.   Current Medications (verified): 1)  Warfarin Sodium 5 Mg Tabs (Warfarin Sodium) .... As Directed 2)  Omeprazole 20 Mg Cpdr (Omeprazole) .... One By Mouth Daily 3)  Furosemide 40 Mg Tabs (Furosemide) .... Take One Twice Daily 4)  Fenofibrate 160 Mg Tabs (Fenofibrate) .... Once Daily 5)  Simvastatin 40 Mg Tabs (Simvastatin) .... Once Daily At [facility] 6)  Flomax 0.4 Mg Cp24 (Tamsulosin Hcl) .... Take 1 Capsule By Mouth Nightly 7)  Isosorbide Mononitrate Cr 60 Mg Xr24h-Tab (Isosorbide Mononitrate) .... Take One Tablet By Mouth Daily 8)  Nitroglycerin 0.4 Mg Subl (Nitroglycerin) .... One Tablet Under Tongue Every 5 Minutes As Needed For Chest Pain---May Repeat Times Three 9)  Stool Softener 100 Mg Caps (Docusate Sodium) .... As Needed 10)  Tylenol Extra Strength 500 Mg Tabs (Acetaminophen) .... As Needed 11)  Nasacort Aq 55 Mcg/act Aers (Triamcinolone Acetonide(Nasal)) .... As Needed 12)  Aspirin 81 Mg Tbec (Aspirin) .... Take One Tablet By Mouth Daily 13)  Alprazolam 0.25 Mg Tabs (Alprazolam) .... Take Daily As Needed 14)  Carvedilol 3.125 Mg Tabs (Carvedilol) .... Take One Twice Daily 15)  Losartan Potassium 50 Mg Tabs (Losartan Potassium) .... Take 1/2 Tablet Daily 16)  Vitamin B-12 500 Mcg Tabs (Cyanocobalamin) .... Take 1 Tablet By Mouth Once A Day  Allergies: 1)  ! Penicillin V Potassium (Penicillin V Potassium) 2)  ! * Cloth Tape 3)  ! Soma 4)  ! Zolpidem Tartrate 5)  ! * Lisinopril  Vital Signs:  Patient profile:   77 year old male Height:      73 inches Weight:      203.50 pounds BMI:     26.95 Pulse rate:   70 / minute Pulse  rhythm:   regular Resp:     18 per minute BP sitting:   100 / 58  (left arm) Cuff size:   large  Vitals Entered By: Vikki Ports (August 17, 2009 1:43 PM)  Physical Exam  General:  Well developed, well nourished, in no acute distress. Head:  normocephalic and atraumatic Eyes:  PERRLA/EOM intact; conjunctiva and lids normal. Neck:  Veins carefully examined.  No significant elevation noted. Lungs:  Clear bilaterally to auscultation and percussion. Heart:  PMI prominent medial to MAL.  There is a holosystolic apical systolic murmur, at least 2/6.  S4 gallop.   Abdomen:  Bowel sounds positive; abdomen soft and non-tender without masses, organomegaly, or hernias noted. No hepatosplenomegaly. Pulses:  pulses normal in all 4 extremities Extremities:  No clubbing or cyanosis.  Surprisingly no edema. Neurologic:  Alert and oriented x 3.   Echocardiogram  Procedure date:  05/14/2009  Findings:      Study Conclusions            - Left ventricle: The cavity size was severely dilated. Wall       thickness was normal. Systolic function was severely reduced. The       estimated ejection fraction was 25%. The anterior wall function       appears to be most preserved. Features are consistent with a  pseudonormal left ventricular filling pattern, with concomitant       abnormal relaxation and increased filling pressure (grade 2       diastolic dysfunction).     - Aortic valve: There was no stenosis. Mild regurgitation.     - Mitral valve: Moderate regurgitation. MR appears to be due to       annular dilatation.     - Left atrium: The atrium was moderately dilated.     - Right ventricle: The cavity size was normal. Pacer wire or       catheter noted in right ventricle. Systolic function was mildly to       moderately reduced.     - Pulmonary arteries: PA peak pressure: 29mm Hg (S).  EKG  Procedure date:  08/17/2009  Findings:      atrial tracking and ventricular pacing.    ICD  Specifications Following MD:  Sherryl Manges, MD     Referring MD:  [facility] ICD Vendor:  [employer]     ICD Model Number:  [id number]     ICD Serial Number:  [id number] ICD DOI:  10/30/2008     ICD Implanting MD:  Sherryl Manges, MD  Lead 1:    Location: RV     DOI: 03/17/2005     Model #: 0157     Serial #: [id number]     Status: active Lead 2:    Location: RA     DOI: 10/30/2008     Model #: [id number]     Serial #: [id number]     Status: active Lead 3:    Location: LV     DOI: 10/30/2008     Model #: 1258T     Serial #: [id number]     Status: active  Indications::  ICM  Explantation Comments: 10/30/2008 [employer] [id number] explanted  ICD Follow Up ICD Dependent:  No       ICD Device Measurements Configuration: LV TIP TO RV COIL  Episodes Coumadin:  Yes  Brady Parameters Mode DDD     Lower Rate Limit:  60     Upper Rate Limit 120 PAV 160     Sensed AV Delay:  140  Tachy Zones VF:  240     VT:  214     VT1:  171     Impression & Recommendations:  Problem # 1:  CARDIOMYOPATHY, ISCHEMIC (ICD-414.8) Continuing to remain stable.   Tolerating medications.  Now on ARB.  Cough resolved off of ACE.  Labs done by Dr. Caryn Section 07/20/09--Cr 1.6 mg/dl The following medications were removed from the medication list:    Lisinopril 5 Mg Tabs (Lisinopril) .Marland Kitchen... Take one twice daily His updated medication list for this problem includes:    Warfarin Sodium 5 Mg Tabs (Warfarin sodium) .Marland Kitchen... As directed    Furosemide 40 Mg Tabs (Furosemide) .Marland Kitchen... Take one twice daily    Isosorbide Mononitrate Cr 60 Mg Xr24h-tab (Isosorbide mononitrate) .Marland Kitchen... Take one tablet by mouth daily    Nitroglycerin 0.4 Mg Subl (Nitroglycerin) ..... One tablet under tongue every 5 minutes as needed for chest pain---may repeat times three    Aspirin 81 Mg Tbec (Aspirin) .Marland Kitchen... Take one tablet by mouth daily    Carvedilol 3.125 Mg Tabs (Carvedilol) .Marland Kitchen... Take one twice daily    Losartan Potassium 50 Mg Tabs (Losartan potassium) .Marland Kitchen... Take 1/2  tablet daily  Problem # 2:  CHRONIC SYSTOLIC HEART FAILURE (ICD-428.22)  Volume status excellent.  No JVP  or edema.  The following medications were removed from the medication list:    Lisinopril 5 Mg Tabs (Lisinopril) .Marland Kitchen... Take one twice daily His updated medication list for this problem includes:    Warfarin Sodium 5 Mg Tabs (Warfarin sodium) .Marland Kitchen... As directed    Furosemide 40 Mg Tabs (Furosemide) .Marland Kitchen... Take one twice daily    Isosorbide Mononitrate Cr 60 Mg Xr24h-tab (Isosorbide mononitrate) .Marland Kitchen... Take one tablet by mouth daily    Nitroglycerin 0.4 Mg Subl (Nitroglycerin) ..... One tablet under tongue every 5 minutes as needed for chest pain---may repeat times three    Aspirin 81 Mg Tbec (Aspirin) .Marland Kitchen... Take one tablet by mouth daily    Carvedilol 3.125 Mg Tabs (Carvedilol) .Marland Kitchen... Take one twice daily    Losartan Potassium 50 Mg Tabs (Losartan potassium) .Marland Kitchen... Take 1/2 tablet daily  Orders: EKG w/ Interpretation (93000)  Problem # 3:  MITRAL VALVE PROLAPSE, NON-RHEUMATIC (ICD-424.0) Prominent murmur.  apical murmur.  Felt due to annular dilitation The following medications were removed from the medication list:    Lisinopril 5 Mg Tabs (Lisinopril) .Marland Kitchen... Take one twice daily His updated medication list for this problem includes:    Furosemide 40 Mg Tabs (Furosemide) .Marland Kitchen... Take one twice daily    Isosorbide Mononitrate Cr 60 Mg Xr24h-tab (Isosorbide mononitrate) .Marland Kitchen... Take one tablet by mouth daily    Nitroglycerin 0.4 Mg Subl (Nitroglycerin) ..... One tablet under tongue every 5 minutes as needed for chest pain---may repeat times three    Carvedilol 3.125 Mg Tabs (Carvedilol) .Marland Kitchen... Take one twice daily    Losartan Potassium 50 Mg Tabs (Losartan potassium) .Marland Kitchen... Take 1/2 tablet daily  Problem # 4:  HYPERLIPIDEMIA (ICD-272.4) at target.  His updated medication list for this problem includes:    Fenofibrate 160 Mg Tabs (Fenofibrate) ..... Once daily    Simvastatin 40 Mg Tabs  (Simvastatin) ..... Once daily at hs  Problem # 5:  RENAL INSUFFICIENCY (ICD-588.9) Followed by Dr. Caryn Section.  Patient Instructions: 1)  Your physician recommends that you continue on your current medications as directed. Please refer to the Current Medication list given to you today. 2)  Your physician wants you to follow-up in:  October 2011.  You will receive a reminder letter in the mail two months in advance. If you don't receive a letter, please call our office to schedule the follow-up appointment.

## 2010-03-05 NOTE — Letter (Signed)
Summary: Remote Device Check  [facility] [employer], Main Office  [address]   [place], [place] [address]   Phone: [phone]  Fax: [phone]     August 22, 2009 MRN: [id number]   [facility] [address] [place], [place]  [address]   Dear Mr. Rayson,   Your remote transmission was recieved and reviewed by your physician.  All diagnostics were within normal limits for you.  __X____Your next office visit is scheduled for:   September with Dr Graciela Husbands. Please call our office to schedule an appointment.    Sincerely,  Vella Kohler

## 2010-03-05 NOTE — Cardiovascular Report (Signed)
Summary: Office Visit   Office Visit   Imported By: Roderic Ovens 11/12/2009 11:24:46  _____________________________________________________________________  External Attachment:    Type:   Image     Comment:   External Document

## 2010-03-05 NOTE — Medication Information (Signed)
Summary: rov/tm  Anticoagulant Therapy  Managed by: Bethena Midget, RN, BSN Referring MD: [name] MD PCP: Evelena Peat MD Supervising MD: Juanda Chance MD, Bruce Indication 1: Atrial Fibrillation (ICD-427.31) Indication 2: deep vein thrombosis--arm (ICD-451.19) Lab Used: [facility] Site: [facility] INR POC 1.6 INR RANGE 2 - 3  Dietary changes: no    Health status changes: no    Bleeding/hemorrhagic complications: no    Recent/future hospitalizations: no    Any changes in medication regimen? no    Recent/future dental: no  Any missed doses?: no       Is patient compliant with meds? yes       Allergies: 1)  ! Penicillin V Potassium (Penicillin V Potassium) 2)  ! * Cloth Tape 3)  ! Soma 4)  ! Zolpidem Tartrate  Anticoagulation Management History:      The patient is taking warfarin and comes in today for a routine follow up visit.  Positive risk factors for bleeding include an age of 75 years or older and presence of serious comorbidities.  The bleeding index is 'intermediate risk'.  Positive CHADS2 values include History of CHF, History of HTN, and Age > 94 years old.  The start date was 11/17/2005.  His last INR was 2.1.  Anticoagulation responsible provider: Juanda Chance MD, Smitty Cords.  INR POC: 1.6.  Cuvette Lot#: [id number].  Exp: 05/2010.    Anticoagulation Management Assessment/Plan:      The patient's current anticoagulation dose is Warfarin sodium 5 mg tabs: as directed.  The target INR is 2 - 3.  The next INR is due 03/15/2009.  Anticoagulation instructions were given to patient.  Results were reviewed/authorized by Bethena Midget, RN, BSN.  He was notified by Bethena Midget, RN, BSN.         Prior Anticoagulation Instructions: INR 2.1 Continue same dosage 1/2 tablet daily except 1 tablet on Mondays, Wednesdays and Fridays. Recheck in 4 weeks  Current Anticoagulation Instructions: INR 1.6 Today take 7.5mg s then change dose to 5mg s everyday except 2.5mg s on Tuesdays,  Thursdays and Sundays. Recheck in 2 weeks.

## 2010-03-05 NOTE — Progress Notes (Signed)
Summary: refill meds  Phone Note Refill Request Call back at Home Phone [phone] Message from:  Pharmacy- [phone] on May 09, 2009 3:28 PM  Refills Requested: Medication #1:  ALPRAZOLAM 0.25 MG TABS take daily as needed [facility]    Method Requested: Fax to Local Pharmacy Initial call taken by: Lorne Skeens,  May 09, 2009 3:28 PM  Follow-up for Phone Call        Med needs to be refilled per Primary care MD. Patient notified Follow-up by: Vikki Ports,  May 14, 2009 2:18 PM

## 2010-03-05 NOTE — Cardiovascular Report (Signed)
Summary: Office Visit   Office Visit   Imported By: Roderic Ovens 02/15/2009 12:37:22  _____________________________________________________________________  External Attachment:    Type:   Image     Comment:   External Document

## 2010-03-05 NOTE — Medication Information (Signed)
Summary: rov/sp  Anticoagulant Therapy  Managed by: Cloyde Reams, RN, BSN Referring MD: [name] MD PCP: Evelena Peat MD Supervising MD: Eden Emms MD, Theron Arista Indication 1: Atrial Fibrillation (ICD-427.31) Indication 2: deep vein thrombosis--arm (ICD-451.19) Lab Used: [facility] Site: [facility] INR POC 1.7 INR RANGE 2 - 3  Dietary changes: no    Health status changes: no    Bleeding/hemorrhagic complications: no    Recent/future hospitalizations: no    Any changes in medication regimen? no    Recent/future dental: no  Any missed doses?: no       Is patient compliant with meds? yes       Allergies: 1)  ! Penicillin V Potassium (Penicillin V Potassium) 2)  ! * Cloth Tape 3)  ! Soma 4)  ! Zolpidem Tartrate  Anticoagulation Management History:      The patient is taking warfarin and comes in today for a routine follow up visit.  Positive risk factors for bleeding include an age of 75 years or older and presence of serious comorbidities.  The bleeding index is 'intermediate risk'.  Positive CHADS2 values include History of CHF, History of HTN, and Age > 61 years old.  The start date was 11/17/2005.  His last INR was 2.1.  Anticoagulation responsible provider: Eden Emms MD, Theron Arista.  INR POC: 1.7.  Cuvette Lot#: [id number].  Exp: 10/2010.    Anticoagulation Management Assessment/Plan:      The patient's current anticoagulation dose is Warfarin sodium 5 mg tabs: as directed.  The target INR is 2 - 3.  The next INR is due 08/24/2009.  Anticoagulation instructions were given to patient.  Results were reviewed/authorized by Cloyde Reams, RN, BSN.  He was notified by Cloyde Reams RN.         Prior Anticoagulation Instructions: INR 2.2  Continue same dose of 1 tablet every day except 1/2 tablet on Sunday, Tuesday and Thursday.   Current Anticoagulation Instructions: INR 1.7  Take 1.5 tablets today,  then start taking 1 tablet daily except 1/2 tablet on Sundays and  Thursdays.  Recheck in 2 weeks.

## 2010-03-05 NOTE — Procedures (Signed)
Summary: Colonoscopy Report/[facility]  Colonoscopy Report/[facility]   Imported By: Maryln Gottron 02/20/2009 13:19:21  _____________________________________________________________________  External Attachment:    Type:   Image     Comment:   External Document

## 2010-03-05 NOTE — Letter (Signed)
Summary: [facility] Office Note  [facility] Office Note   Imported By: Roderic Ovens 06/28/2009 13:25:31  _____________________________________________________________________  External Attachment:    Type:   Image     Comment:   External Document

## 2010-03-05 NOTE — Medication Information (Signed)
Summary: rov/sl  Anticoagulant Therapy  Managed by: Weston Brass, PharmD Referring MD: [name] MD PCP: Evelena Peat MD Supervising MD: Gala Romney MD, Reuel Boom Indication 1: Atrial Fibrillation (ICD-427.31) Indication 2: deep vein thrombosis--arm (ICD-451.19) Lab Used: [facility] Site: [facility] INR POC 2.1 INR RANGE 2 - 3  Dietary changes: no    Health status changes: no    Bleeding/hemorrhagic complications: no    Recent/future hospitalizations: no    Any changes in medication regimen? no    Recent/future dental: no  Any missed doses?: no       Is patient compliant with meds? yes       Allergies: 1)  ! Penicillin V Potassium (Penicillin V Potassium) 2)  ! * Cloth Tape 3)  ! Soma 4)  ! Zolpidem Tartrate 5)  ! * Lisinopril  Anticoagulation Management History:      The patient is taking warfarin and comes in today for a routine follow up visit.  Positive risk factors for bleeding include an age of 75 years or older and presence of serious comorbidities.  The bleeding index is 'intermediate risk'.  Positive CHADS2 values include History of CHF, History of HTN, and Age > 39 years old.  The start date was 11/17/2005.  His last INR was 2.1.  Anticoagulation responsible provider: Bensimhon MD, Reuel Boom.  INR POC: 2.1.  Cuvette Lot#: [id number].  Exp: 03/2011.    Anticoagulation Management Assessment/Plan:      The patient's current anticoagulation dose is Warfarin sodium 5 mg tabs: as directed.  The target INR is 2 - 3.  The next INR is due 02/08/2010.  Anticoagulation instructions were given to patient.  Results were reviewed/authorized by Weston Brass, PharmD.  He was notified by Weston Brass PharmD.         Prior Anticoagulation Instructions: INR 3.0  Continue taking Coumadin 0.5 tab (2.5 mg) on Sun, Thur and Coumadin 1 tab (5 mg) all other days. Return to clinic in 4 weeks.   Current Anticoagulation Instructions: INR 2.1  Continue same dose of 1 tablet every day  except 1/2 tablet on Sunday and Thursday.  Recheck INR in 4 weeks.

## 2010-03-05 NOTE — Assessment & Plan Note (Signed)
Summary: PC2/ST JUDE/JML   Primary Provider:  Evelena Peat MD  CC:  pc2 st jude.  History of Present Illness: Sean [name] is seen in followup for ventricular tachycardia in the setting of ischemic heart disease and a prior ICD implantation.  He is doing quite well from arrhythmia point-of- view.  He underwent CRT upgrade in September. However, since then he has had a couple of hospitalizations for heart failure. I have reviewed the chest x-rays most recently from December 19 and the AP projection demonstrates no macro dislodgment.  I also reviewed the AV optimization echo cardiogram from October. not withstanding the fact that he had a couple of hospitalizations, he is wife both think he is doing better since the LV lead was placed  Current Medications (verified): 1)  Warfarin Sodium 5 Mg Tabs (Warfarin Sodium) .... As Directed 2)  Omeprazole 20 Mg Cpdr (Omeprazole) .... One By Mouth Daily 3)  Furosemide 40 Mg Tabs (Furosemide) .... Take One Twice Daily 4)  Fenofibrate 160 Mg Tabs (Fenofibrate) .... Once Daily 5)  Simvastatin 40 Mg Tabs (Simvastatin) .... Once Daily At [facility] 6)  Flomax 0.4 Mg Cp24 (Tamsulosin Hcl) .... Take 1 Capsule By Mouth Nightly 7)  Imdur 60 Mg Xr24h-Tab (Isosorbide Mononitrate) .... Qd 8)  Nitroglycerin 0.4 Mg Subl (Nitroglycerin) .... One Tablet Under Tongue Every 5 Minutes As Needed For Chest Pain---May Repeat Times Three 9)  Stool Softener 100 Mg Caps (Docusate Sodium) .... As Needed 10)  Mucinex Dm 30-600 Mg Xr12h-Tab (Dextromethorphan-Guaifenesin) .... As Needed 11)  Tylenol Extra Strength 500 Mg Tabs (Acetaminophen) .... As Needed 12)  Nasacort Aq 55 Mcg/act Aers (Triamcinolone Acetonide(Nasal)) .... As Needed 13)  Aspirin 325 Mg Tabs (Aspirin) .... Take One Tablet Daily 14)  Alprazolam 0.25 Mg Tabs (Alprazolam) .... Take Daily As Needed 15)  Carvedilol 3.125 Mg Tabs (Carvedilol) .... Take One Twice Daily 16)  Lisinopril 5 Mg Tabs (Lisinopril) .... Take One  Twice Daily 17)  Loperamide Hcl 2 Mg Caps (Loperamide Hcl) .... Take As Needed  Allergies (verified): 1)  ! Penicillin V Potassium (Penicillin V Potassium) 2)  ! * Cloth Tape 3)  ! Soma 4)  ! Zolpidem Tartrate  Past History:  Past Medical History: Last updated: 2009-02-05 Colonic polyps, hx of, 2006 Colonscopy Diverticulitis, hx of 2000 Colonscopy GERD Hyperlipidemia Hypertension Ischemic heart disease. Depressed left ventricular function.  Ventricular tachycardia. Status post implantable cardioverter-defibrillator with history of   infection and requiring explantation revision.-2010--St. Jude unify CD 3231  Congestive heart failure - Class II to III.   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 Contributing   to the above.   Coronary artery disease  Chronic kidney disease.  History of pulmonary embolus.   Past Surgical History: Last updated: 02-05-2009 open heart 1991, 1997 Lung surg 1987 Hernia repair 1952, 2004 cholecystectomy. appendectomy.  right thoracotomy secondary to lung mass Pacemaker insertion-St. Jude unify CD 3231/2010  Family History: Last updated: Feb 05, 2009  His mother died at age 40 of coronary artery disease,   and he has got 5 siblings with a history of MI and heart disease.  His father died at age 38, probably of sepsis.   Social History: Last updated: 04/07/2008 Occupation:  Nutritional therapist Retired Married Alcohol use-no Regular exercise-yes Smoked 35 years ago  Vital Signs:  Patient profile:   77 year old male Height:      73 inches Weight:      198 pounds BMI:  26.22 Pulse rate:   66 / minute Pulse rhythm:   regular BP sitting:   104 / 66  (left arm) Cuff size:   regular  Vitals Entered By: Judithe Modest CMA (February 06, 2009 10:10 AM)  Physical Exam  General:  The patient was alert and oriented in no acute distress. HEENT Normal.  Neck veins were  flat, carotids were brisk.  Lungs were clear.  Heart sounds were regular without murmurs or gallops.  Abdomen was soft with active bowel sounds. There is no clubbing cyanosis or edema. Skin Warm and dry neurological exam was    ICD Specifications Following MD:  Sherryl Manges, MD     Referring MD:  [facility] ICD Vendor:  St Jude     ICD Model Number:  ZO1096-04     ICD Serial Number:  [id number] ICD DOI:  10/30/2008     ICD Implanting MD:  Sherryl Manges, MD  Lead 1:    Location: RV     DOI: 03/17/2005     Model #: [id number]     Serial #: [id number]     Status: active Lead 2:    Location: RA     DOI: 10/30/2008     Model #: [id number]     Serial #: [id number]     Status: active Lead 3:    Location: LV     DOI: 10/30/2008     Model #: [id number]     Serial #: [id number]     Status: active  Indications::  ICM  Explantation Comments: 10/30/2008 [employer] [id number] explanted  ICD Follow Up Remote Check?  No Charge Time:  9.0 seconds     Battery Est. Longevity:  5.2 years ICD Dependent:  No       ICD Device Measurements Atrium:  Amplitude: 3.1 mV, Impedance: 480 ohms, Threshold: 0.875 V at 0.5 msec Right Ventricle:  Amplitude: 11.2 mV, Impedance: 400 ohms, Threshold: 0.75 V at 0.5 msec Left Ventricle:  Impedance: 510 ohms, Threshold: 2.25 V at 0.5 msec Configuration: LV TIP TO RV COIL  Episodes MS Episodes:  5     Percent Mode Switch:  <1%     Coumadin:  Yes Shock:  0     ATP:  0     Nonsustained:  0     Atrial Pacing:  2.4%     Ventricular Pacing:  94%  Brady Parameters Mode DDD     Lower Rate Limit:  60     Upper Rate Limit 120 PAV 160     Sensed AV Delay:  140  Tachy Zones VF:  240     VT:  214     VT1:  171     Next Remote Date:  05/07/2009     Next Cardiology Appt Due:  10/04/2009 Tech Comments:  No parameter changes.  Merlin transmissions every 3 months.  ROV 9/11 Dr. Laurena Spies, LPN  February 06, 2009 10:45 AM   Impression & Recommendations:  Problem # 1:  CHRONIC SYSTOLIC HEART  FAILURE (ICD-428.22) I am not optimistic that we are going to be able to control Sean [name] heart failure. I will check a chest x-ray today to make sure there is no evidence of macro dislodgment of his LV lead. He may well end up on outpatient inotropes given his failure to be kept out of hospital. He may be a candidate for Aldactone. He is to followup with Dr. [name] next week and I will defer  this decision to him. His updated medication list for this problem includes:    Warfarin Sodium 5 Mg Tabs (Warfarin sodium) .Marland Kitchen... As directed    Furosemide 40 Mg Tabs (Furosemide) .Marland Kitchen... Take one twice daily    Imdur 60 Mg Xr24h-tab (Isosorbide mononitrate) ..... Qd    Nitroglycerin 0.4 Mg Subl (Nitroglycerin) ..... One tablet under tongue every 5 minutes as needed for chest pain---may repeat times three    Aspirin 325 Mg Tabs (Aspirin) .Marland Kitchen... Take one tablet daily    Carvedilol 3.125 Mg Tabs (Carvedilol) .Marland Kitchen... Take one twice daily    Lisinopril 5 Mg Tabs (Lisinopril) .Marland Kitchen... Take one twice daily  His updated medication list for this problem includes:    Warfarin Sodium 5 Mg Tabs (Warfarin sodium) .Marland Kitchen... As directed    Furosemide 40 Mg Tabs (Furosemide) .Marland Kitchen... Take one twice daily    Imdur 60 Mg Xr24h-tab (Isosorbide mononitrate) ..... Qd    Nitroglycerin 0.4 Mg Subl (Nitroglycerin) ..... One tablet under tongue every 5 minutes as needed for chest pain---may repeat times three    Aspirin 325 Mg Tabs (Aspirin) .Marland Kitchen... Take one tablet daily    Carvedilol 3.125 Mg Tabs (Carvedilol) .Marland Kitchen... Take one twice daily    Lisinopril 5 Mg Tabs (Lisinopril) .Marland Kitchen... Take one twice daily  Orders: T-2 View CXR (71020TC)  Problem # 2:  IMPLANTATION OF DEFIBRILLATOR, ST. JUDE UNIFY CD 3231 (ICD-V45.02) device function is normal. We will check for lead stability  Problem # 3:  CARDIOMYOPATHY, ISCHEMIC (ICD-414.8) no evidence of active coronary disease His updated medication list for this problem includes:    Warfarin  Sodium 5 Mg Tabs (Warfarin sodium) .Marland Kitchen... As directed    Furosemide 40 Mg Tabs (Furosemide) .Marland Kitchen... Take one twice daily    Imdur 60 Mg Xr24h-tab (Isosorbide mononitrate) ..... Qd    Nitroglycerin 0.4 Mg Subl (Nitroglycerin) ..... One tablet under tongue every 5 minutes as needed for chest pain---may repeat times three    Aspirin 325 Mg Tabs (Aspirin) .Marland Kitchen... Take one tablet daily    Carvedilol 3.125 Mg Tabs (Carvedilol) .Marland Kitchen... Take one twice daily    Lisinopril 5 Mg Tabs (Lisinopril) .Marland Kitchen... Take one twice daily  His updated medication list for this problem includes:    Warfarin Sodium 5 Mg Tabs (Warfarin sodium) .Marland Kitchen... As directed    Furosemide 40 Mg Tabs (Furosemide) .Marland Kitchen... Take one twice daily    Imdur 60 Mg Xr24h-tab (Isosorbide mononitrate) ..... Qd    Nitroglycerin 0.4 Mg Subl (Nitroglycerin) ..... One tablet under tongue every 5 minutes as needed for chest pain---may repeat times three    Aspirin 325 Mg Tabs (Aspirin) .Marland Kitchen... Take one tablet daily    Carvedilol 3.125 Mg Tabs (Carvedilol) .Marland Kitchen... Take one twice daily    Lisinopril 5 Mg Tabs (Lisinopril) .Marland Kitchen... Take one twice daily  Patient Instructions: 1)  A chest x-ray takes a picture of the organs and structures inside the chest, including the heart, lungs, and blood vessels. This test can show several things, including, whether the heart is enlarged; whether fluid is building up in the lungs; and whether pacemaker / defibrillator leads are still in place. 2)  Your physician recommends that you schedule a follow-up appointment in: 3 months with device clinic

## 2010-03-05 NOTE — Letter (Signed)
Summary: [employer] - Health Management Program  [facility] - Health Management Program   Imported By: Marylou Mccoy 04/18/2009 10:10:28  _____________________________________________________________________  External Attachment:    Type:   Image     Comment:   External Document

## 2010-03-05 NOTE — Cardiovascular Report (Signed)
Summary: Health Management Program Summary Report   Health Management Program Summary Report   Imported By: Roderic Ovens 07/27/2009 11:11:51  _____________________________________________________________________  External Attachment:    Type:   Image     Comment:   External Document

## 2010-03-05 NOTE — Assessment & Plan Note (Signed)
Summary: device/saf   Visit Type:  device check Primary Provider:  Evelena Peat MD  CC:  No complains.  History of Present Illness: Sean [name] is seen in followup for CRT upgrade of a previously implanted ICD accomplished about a year ago. The background is ischemic cardiomyopathy with prior bypass surgery  While his functional capacity is much improved, he continues to have predictable exertional chest discomfort that is relieved by rest.  Current Medications (verified): 1)  Warfarin Sodium 5 Mg Tabs (Warfarin Sodium) .... As Directed 2)  Omeprazole 20 Mg Cpdr (Omeprazole) .... One By Mouth Two Times A Day 3)  Furosemide 40 Mg Tabs (Furosemide) .... Take One Twice Daily 4)  Fenofibrate 160 Mg Tabs (Fenofibrate) .... Once Daily 5)  Simvastatin 40 Mg Tabs (Simvastatin) .... Once Daily At [facility] 6)  Flomax 0.4 Mg Cp24 (Tamsulosin Hcl) .... Take 1 Capsule By Mouth Nightly 7)  Isosorbide Mononitrate Cr 60 Mg Xr24h-Tab (Isosorbide Mononitrate) .... Take One Tablet By Mouth Daily 8)  Nitroglycerin 0.4 Mg Subl (Nitroglycerin) .... One Tablet Under Tongue Every 5 Minutes As Needed For Chest Pain---May Repeat Times Three 9)  Stool Softener 100 Mg Caps (Docusate Sodium) .... As Needed 10)  Tylenol Extra Strength 500 Mg Tabs (Acetaminophen) .... As Needed 11)  Nasacort Aq 55 Mcg/act Aers (Triamcinolone Acetonide(Nasal)) .... As Needed 12)  Aspirin 81 Mg Tbec (Aspirin) .... Take One Tablet By Mouth Daily 13)  Alprazolam 0.25 Mg Tabs (Alprazolam) .... Take Daily As Needed 14)  Carvedilol 3.125 Mg Tabs (Carvedilol) .... Take One Twice Daily 15)  Losartan Potassium 50 Mg Tabs (Losartan Potassium) .... Take 1/2 Tablet Daily 16)  Vitamin B-12 500 Mcg Tabs (Cyanocobalamin) .... Take 1 Tablet By Mouth Once A Day  Allergies: 1)  ! Penicillin V Potassium (Penicillin V Potassium) 2)  ! * Cloth Tape 3)  ! Soma 4)  ! Zolpidem Tartrate 5)  ! * Lisinopril  Past History:  Past Medical History: Last  updated: 2009-02-09 Colonic polyps, hx of, 2006 Colonscopy Diverticulitis, hx of 2000 Colonscopy GERD Hyperlipidemia Hypertension Ischemic heart disease. Depressed left ventricular function.  Ventricular tachycardia. Status post implantable cardioverter-defibrillator with history of   infection and requiring explantation revision.-2010--St. Jude unify CD 3231  Congestive heart failure - Class II to III.   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 Contributing   to the above.   Coronary artery disease  Chronic kidney disease.  History of pulmonary embolus.   Past Surgical History: Last updated: 2009-02-09 open heart 1991, 1997 Lung surg 1987 Hernia repair 1952, 2004 cholecystectomy. appendectomy.  right thoracotomy secondary to lung mass Pacemaker insertion-St. Jude unify CD 3231/2010  Family History: Last updated: 2009-02-09  His mother died at age 67 of coronary artery disease,   and he has got 5 siblings with a history of MI and heart disease.  His father died at age 25, probably of sepsis.   Social History: Last updated: 04/07/2008 Occupation:  Nutritional therapist Retired Married Alcohol use-no Regular exercise-yes Smoked 35 years ago  Vital Signs:  Patient profile:   77 year old male Height:      73 inches Weight:      205 pounds BMI:     27.14 Pulse rate:   66 / minute Pulse rhythm:   irregular Resp:     18 per minute BP sitting:   140 / 60  (left arm) Cuff size:   large  Vitals Entered By: Vikki Ports (November 06, 2009 2:27 PM)  Physical Exam  General:  The patient was alert and oriented in no acute distress. HEENT Normal.  Neck veins were flat, carotids were brisk.  Lungs were clear.  Heart sounds were regular without murmurs or gallops.  Abdomen was soft with active bowel sounds. There is no clubbing cyanosis or edema. Skin Warm and dry     ICD Specifications Following  MD:  Sherryl Manges, MD     Referring MD:  [facility] ICD Vendor:  St Jude     ICD Model Number:  [id number]     ICD Serial Number:  [id number] ICD DOI:  10/30/2008     ICD Implanting MD:  Sherryl Manges, MD  Lead 1:    Location: RV     DOI: 03/17/2005     Model #: [id number]     Serial #: [id number]     Status: active Lead 2:    Location: RA     DOI: 10/30/2008     Model #: [id number]     Serial #: [id number]     Status: active Lead 3:    Location: LV     DOI: 10/30/2008     Model #: 1258T     Serial #: [id number]     Status: active  Indications::  ICM  Explantation Comments: 10/30/2008 [employer] [id number] explanted  ICD Follow Up Battery Voltage:  81% V     Charge Time:  9.8 seconds     Battery Est. Longevity:  3.8-4.1 yrs Underlying rhythm:  SR ICD Dependent:  No       ICD Device Measurements Atrium:  Amplitude: 4.7 mV, Impedance: 450 ohms, Threshold: 0.75 V at 0.5 msec Right Ventricle:  Amplitude: 12.0 mV, Impedance: 480 ohms, Threshold: 0.75 V at 0.5 msec Left Ventricle:  Impedance: 410 ohms, Threshold: 1.5 V at 1.0 msec Configuration: LV TIP TO RV COIL Shock Impedance: 44 ohms   Episodes MS Episodes:  0     Coumadin:  Yes Shock:  0     ATP:  0     Nonsustained:  0     Atrial Therapies:  0 Atrial Pacing:  18%     Ventricular Pacing:  95%  Brady Parameters Mode DDD     Lower Rate Limit:  60     Upper Rate Limit 120 PAV 160     Sensed AV Delay:  140  Tachy Zones VF:  240     VT:  214     VT1:  171     Next Remote Date:  02/07/2010     Tech Comments:  NORMAL DEVICE FUNCTION.  NO EPISODES SINCE LAST CHECK.  CHANGED RV OUTPUT FROM 2.0 TO 2.5 AND LV OUTPUT FROM 3.5 TO 3.0 V. TURNED RATE RESPONSE ON DURING MODE SWITCH. MERLIN TRANSMISSION 02-07-09. ROV IN 12 MTHS W/SK. Vella Kohler  November 06, 2009 2:39 PM  Impression & Recommendations:  Problem # 1:  CHRONIC SYSTOLIC HEART FAILURE (ICD-428.22) stable on his current meds. His major limitation is chest discomfort (see below)  Problem # 2:   CARDIOMYOPATHY, ISCHEMIC (ICD-414.8) he continues to have chest discomfort with exertion it is predictable. I have suggested that he try taking nitroglycerin sublingually prior to his exertion to see if that doesn't help. I will defer to Dr. [name] whether he might not be a candidate for next for unrelenting angina. His blood pressure sufficiently high that further titration of his nitroglycerin might also be worth pursuing. His updated  medication list for this problem includes:    Warfarin Sodium 5 Mg Tabs (Warfarin sodium) .Marland Kitchen... As directed    Furosemide 40 Mg Tabs (Furosemide) .Marland Kitchen... Take one twice daily    Isosorbide Mononitrate Cr 60 Mg Xr24h-tab (Isosorbide mononitrate) .Marland Kitchen... Take one tablet by mouth daily    Nitroglycerin 0.4 Mg Subl (Nitroglycerin) ..... One tablet under tongue every 5 minutes as needed for chest pain---may repeat times three    Aspirin 81 Mg Tbec (Aspirin) .Marland Kitchen... Take one tablet by mouth daily    Carvedilol 3.125 Mg Tabs (Carvedilol) .Marland Kitchen... Take one twice daily    Losartan Potassium 50 Mg Tabs (Losartan potassium) .Marland Kitchen... Take 1/2 tablet daily  Problem # 3:  IMPLANTATION OF DEFIBRILLATOR, ST. JUDE UNIFY CD 3231 (ICD-V45.02) Device parameters and data were reviewed and no changes were made  Patient Instructions: 1)  Your physician recommends that you continue on your current medications as directed. Please refer to the Current Medication list given to you today. 2)  Your physician wants you to follow-up in:  1 year You will receive a reminder letter in the mail two months in advance. If you don't receive a letter, please call our office to schedule the follow-up appointment. Prescriptions: NITROGLYCERIN 0.4 MG SUBL (NITROGLYCERIN) One tablet under tongue every 5 minutes as needed for chest pain---may repeat times three  #25 x 6   Entered by:   Claris Gladden RN   Authorized by:   Nathen May, MD, [facility]   Signed by:   Claris Gladden RN on 11/06/2009   Method used:    Electronically to        [facility] [id number]* (retail)       [address] [place], [place]  [address]       Ph: [phone]       Fax: [phone]   RxID:   [id number]

## 2010-03-05 NOTE — Medication Information (Signed)
Summary: rov/tm  Anticoagulant Therapy  Managed by: Eda Keys, PharmD Referring MD: [name] MD PCP: Evelena Peat MD Supervising MD: Tenny Craw MD, Gunnar Fusi Indication 1: Atrial Fibrillation (ICD-427.31) Indication 2: deep vein thrombosis--arm (ICD-451.19) Lab Used: [facility] Site: [facility] INR POC 2.4 INR RANGE 2 - 3  Dietary changes: no    Health status changes: no    Bleeding/hemorrhagic complications: no    Recent/future hospitalizations: no    Any changes in medication regimen? no    Recent/future dental: no  Any missed doses?: no       Is patient compliant with meds? yes       Allergies: 1)  ! Penicillin V Potassium (Penicillin V Potassium) 2)  ! * Cloth Tape 3)  ! Soma 4)  ! Zolpidem Tartrate  Anticoagulation Management History:      The patient is taking warfarin and comes in today for a routine follow up visit.  Positive risk factors for bleeding include an age of 67 years or older and presence of serious comorbidities.  The bleeding index is 'intermediate risk'.  Positive CHADS2 values include History of CHF, History of HTN, and Age > 11 years old.  The start date was 11/17/2005.  His last INR was 2.1.  Anticoagulation responsible provider: Tenny Craw MD, Gunnar Fusi.  INR POC: 2.4.  Cuvette Lot#: [id number].  Exp: 06/2010.    Anticoagulation Management Assessment/Plan:      The patient's current anticoagulation dose is Warfarin sodium 5 mg tabs: as directed.  The target INR is 2 - 3.  The next INR is due 05/07/2009.  Anticoagulation instructions were given to patient.  Results were reviewed/authorized by Eda Keys, PharmD.  He was notified by Eda Keys.         Prior Anticoagulation Instructions: INR 2.2 Continue 5mg s everyday except 2.5mg s on Tuesdays, Thursdays, and Sundays. Recheck in 4 weeks.   Current Anticoagulation Instructions: INR 2.4  Continue current dosing schedule of 1/2 tablet on Sunday, Tuesday, and Thursday, and 1 tablet all  other days.  Return to clinic in 4 weeks.  Prescriptions: WARFARIN SODIUM 5 MG TABS (WARFARIN SODIUM) as directed  #30 x 3   Entered by:   Elizabeth Coble   Authorized by:   Thomas David Stuckey, MD, FACC   Signed by:   Elizabeth Coble on 04/09/2009   Method used:   Electronically to        [facility]* (retail)       [address] [place], [place]  [address]       Ph: [phone] or [phone]       Fax: [phone]   RxID:   [id number]

## 2010-03-05 NOTE — Letter (Signed)
Summary: [facility] - Health Management Program  [facility] - Health Management [facility]   Imported By: Marylou Mccoy 04/18/2009 10:15:03  _____________________________________________________________________  External Attachment:    Type:   Image     Comment:   External Document  Appended Document: [employer] - Health Management Program document reviewed.  TS

## 2010-03-05 NOTE — Letter (Signed)
Summary: [facility]  [facility]   Imported By: Maryln Gottron 08/13/2009 15:35:12  _____________________________________________________________________  External Attachment:    Type:   Image     Comment:   External Document  Appended Document: [facility] Note reviewed. TS

## 2010-03-05 NOTE — Medication Information (Signed)
Summary: rov/eac  Anticoagulant Therapy  Managed by: Cloyde Reams, RN, BSN Referring MD: [name] MD PCP: Evelena Peat MD Supervising MD: Clifton James MD, Cristal Deer Indication 1: Atrial Fibrillation (ICD-427.31) Indication 2: deep vein thrombosis--arm (ICD-451.19) Lab Used: [facility] Site: [facility] INR RANGE 2 - 3  Dietary changes: no    Health status changes: no    Bleeding/hemorrhagic complications: no    Recent/future hospitalizations: no    Any changes in medication regimen? no    Recent/future dental: no  Any missed doses?: no       Is patient compliant with meds? yes       Allergies: 1)  ! Penicillin V Potassium (Penicillin V Potassium) 2)  ! * Cloth Tape 3)  ! Soma 4)  ! Zolpidem Tartrate  Anticoagulation Management History:      The patient is taking warfarin and comes in today for a routine follow up visit.  Positive risk factors for bleeding include an age of 14 years or older and presence of serious comorbidities.  The bleeding index is 'intermediate risk'.  Positive CHADS2 values include History of CHF, History of HTN, and Age > 74 years old.  The start date was 11/17/2005.  His last INR was 2.1.  Anticoagulation responsible provider: Clifton James MD, Cristal Deer.  Cuvette Lot#: [id number].  Exp: 06/2010.    Anticoagulation Management Assessment/Plan:      The patient's current anticoagulation dose is Warfarin sodium 5 mg tabs: as directed.  The target INR is 2 - 3.  The next INR is due 06/04/2009.  Anticoagulation instructions were given to patient.  Results were reviewed/authorized by Cloyde Reams, RN, BSN.  He was notified by Cloyde Reams RN.         Prior Anticoagulation Instructions: INR 2.4  Continue current dosing schedule of 1/2 tablet on Sunday, Tuesday, and Thursday, and 1 tablet all other days.  Return to clinic in 4 weeks.   Current Anticoagulation Instructions: INR 2.2  Continue on same dosage 1 tablet daily except 1/2 tablet on  Sundays, Tuesdays, and Thursdays.  Recheck in 4 weeks.

## 2010-03-05 NOTE — Medication Information (Signed)
Summary: rov/tm  Anticoagulant Therapy  Managed by: Bethena Midget Referring MD: [name] MD PCP: Evelena Peat MD Supervising MD: Juanda Chance MD, Marji Kuehnel Indication 1: Atrial Fibrillation (ICD-427.31) Indication 2: deep vein thrombosis--arm (ICD-451.19) Lab Used: [facility] Site: [facility] INR POC 2.1 INR RANGE 2 - 3  Dietary changes: no    Health status changes: no    Bleeding/hemorrhagic complications: no    Recent/future hospitalizations: no    Any changes in medication regimen? no    Recent/future dental: no  Any missed doses?: no       Is patient compliant with meds? yes       Allergies (verified): 1)  ! Penicillin V Potassium (Penicillin V Potassium) 2)  ! * Cloth Tape 3)  ! Soma 4)  ! Zolpidem Tartrate  Anticoagulation Management History:      The patient is taking warfarin and comes in today for a routine follow up visit.  Positive risk factors for bleeding include an age of 75 years or older and presence of serious comorbidities.  The bleeding index is 'intermediate risk'.  Positive CHADS2 values include History of CHF, History of HTN, and Age > 75 years old.  The start date was 11/17/2005.  His last INR was 2.3 and today's INR is 2.1.  Anticoagulation responsible provider: Juanda Chance MD, Smitty Cords.  INR POC: 2.1.  Cuvette Lot#: [id number].  Exp: 03/2010.    Anticoagulation Management Assessment/Plan:      The patient's current anticoagulation dose is Warfarin sodium 5 mg tabs: as directed.  The target INR is 2 - 3.  The next INR is due 03/05/2009.  Anticoagulation instructions were given to patient.  Results were reviewed/authorized by Bethena Midget.  He was notified by Ysidro Evert, Pharm D Candidate.         Prior Anticoagulation Instructions: INR 2.5 Continue 2.5mg s everyday except 5mg s on Mondays,Wednesdays and Fridays. Recheck in one with MD appt.   Current Anticoagulation Instructions: INR 2.1 Continue same dosage 1/2 tablet daily except 1 tablet on  Mondays, Wednesdays and Fridays. Recheck in 4 weeks

## 2010-03-05 NOTE — Assessment & Plan Note (Signed)
Summary: spot on face//ccm   Vital Signs:  Patient profile:   77 year old male Temp:     98.6 degrees F oral BP sitting:   130 / 70  (left arm) Cuff size:   regular  Vitals Entered By: Sid Falcon LPN (Jun 08, 4538 2:53 PM) CC: concerned about spots on face, arm, ears   History of Present Illness: Patient here to have a couple skin lesions evaluated.  first lesion left forearm. Asymptomatic. Slightly scaly and circumferential. No prior history of skin cancer.  Second lesion is behind left ear. Thinks he may have scraped  this a few weeks ago. Eschar which scrapes off. Nontender.    Allergies: 1)  ! Penicillin V Potassium (Penicillin V Potassium) 2)  ! * Cloth Tape 3)  ! Soma 4)  ! Zolpidem Tartrate  Past History:  Past Medical History: Last updated: 02/02/2009 Colonic polyps, hx of, 2006 Colonscopy Diverticulitis, hx of 2000 Colonscopy GERD Hyperlipidemia Hypertension Ischemic heart disease. Depressed left ventricular function.  Ventricular tachycardia. Status post implantable cardioverter-defibrillator with history of   infection and requiring explantation revision.-2010--[employer] unify CD [id number]  Congestive heart failure - Class II to III.   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 Contributing   to the above.   Coronary artery disease  Chronic kidney disease.  History of pulmonary embolus.   Review of Systems  The patient denies anorexia, weight loss, chest pain, and syncope.    Physical Exam  General:  Well-developed,well-nourished,in no acute distress; alert,appropriate and cooperative throughout examination Skin:  left forearm reveals circumferential 1 cm minimally scaly lesion mid forearm consistent with probable keratosis. No pigmentary change. Behind left ear very small eschar approximately 3 mm diameter. No evidence for ulceration. No active bleeding.   Impression &  Recommendations:  Problem # 1:  SKIN RASH (ICD-782.1) suspect benign keratosis mid forearm. Reassurance given. Lesion behind left ear possibly small eschar from healing abrasion. Cannot rule out early squamous cell. This is not fully resolved in 3 weeks he will return for consideration of skin biopsy  Complete Medication List: 1)  Warfarin Sodium 5 Mg Tabs (Warfarin sodium) .... As directed 2)  Omeprazole 20 Mg Cpdr (Omeprazole) .... One by mouth daily 3)  Furosemide 40 Mg Tabs (Furosemide) .... Take one twice daily 4)  Fenofibrate 160 Mg Tabs (Fenofibrate) .... Once daily 5)  Simvastatin 40 Mg Tabs (Simvastatin) .... Once daily at hs 6)  Flomax 0.4 Mg Cp24 (Tamsulosin hcl) .... Take 1 capsule by mouth nightly 7)  Isosorbide Mononitrate Cr 60 Mg Xr24h-tab (Isosorbide mononitrate) .... Take one tablet by mouth daily 8)  Nitroglycerin 0.4 Mg Subl (Nitroglycerin) .... One tablet under tongue every 5 minutes as needed for chest pain---may repeat times three 9)  Stool Softener 100 Mg Caps (Docusate sodium) .... As needed 10)  Tylenol Extra Strength 500 Mg Tabs (Acetaminophen) .... As needed 11)  Nasacort Aq 55 Mcg/act Aers (Triamcinolone acetonide(nasal)) .... As needed 12)  Aspirin 81 Mg Tbec (Aspirin) .... Take one tablet by mouth daily 13)  Alprazolam 0.25 Mg Tabs (Alprazolam) .... Take daily as needed 14)  Carvedilol 3.125 Mg Tabs (Carvedilol) .... Take one twice daily 15)  Lisinopril 5 Mg Tabs (Lisinopril) .... Take one twice daily  Patient Instructions: 1)  return in 3 weeks if skin lesion behind left ear has not fully healed

## 2010-03-05 NOTE — Miscellaneous (Signed)
Summary: Refill  Clinical Lists Changes  Medications: Changed medication from IMDUR 60 MG XR24H-TAB (ISOSORBIDE MONONITRATE) qd to ISOSORBIDE MONONITRATE CR 60 MG XR24H-TAB (ISOSORBIDE MONONITRATE) Take one tablet by mouth daily - Signed Rx of ISOSORBIDE MONONITRATE CR 60 MG XR24H-TAB (ISOSORBIDE MONONITRATE) Take one tablet by mouth daily;  #90 x 3;  Signed;  Entered by: Julieta Gutting, RN, BSN;  Authorized by: Ronaldo Miyamoto, MD, [facility];  Method used: Electronically to [facility]*, [address], [place], [place]  [address], Ph: [phone] or [phone], Fax: [phone]    Prescriptions: ISOSORBIDE MONONITRATE CR 60 MG XR24H-TAB (ISOSORBIDE MONONITRATE) Take one tablet by mouth daily  #90 x 3   Entered by:   Julieta Gutting, RN, BSN   Authorized by:   Ronaldo Miyamoto, MD, [facility]   Signed by:   Julieta Gutting, RN, BSN on 04/09/2009   Method used:   Electronically to        [facility]* (retail)       [address] [place], [place]  [address]       Ph: [phone] or [phone]       Fax: [phone]   RxID:   [id number]

## 2010-03-05 NOTE — Medication Information (Signed)
Summary: rov/sp  Anticoagulant Therapy  Managed by: Weston Brass, PharmD Referring MD: [name] MD PCP: Evelena Peat MD Supervising MD: Tenny Craw MD, Gunnar Fusi Indication 1: Atrial Fibrillation (ICD-427.31) Indication 2: deep vein thrombosis--arm (ICD-451.19) Lab Used: [facility] Site: [facility] INR POC 2.2 INR RANGE 2 - 3  Dietary changes: no    Health status changes: no    Bleeding/hemorrhagic complications: no    Recent/future hospitalizations: no    Any changes in medication regimen? no    Recent/future dental: no  Any missed doses?: no       Is patient compliant with meds? yes       Allergies: 1)  ! Penicillin V Potassium (Penicillin V Potassium) 2)  ! * Cloth Tape 3)  ! Soma 4)  ! Zolpidem Tartrate 5)  ! * Lisinopril  Anticoagulation Management History:      The patient is taking warfarin and comes in today for a routine follow up visit.  Positive risk factors for bleeding include an age of 75 years or older and presence of serious comorbidities.  The bleeding index is 'intermediate risk'.  Positive CHADS2 values include History of CHF, History of HTN, and Age > 75 years old.  The start date was 11/17/2005.  His last INR was 2.1.  Anticoagulation responsible provider: Tenny Craw MD, Gunnar Fusi.  INR POC: 2.2.  Cuvette Lot#: [id number].  Exp: 11/2010.    Anticoagulation Management Assessment/Plan:      The patient's current anticoagulation dose is Warfarin sodium 5 mg tabs: as directed.  The target INR is 2 - 3.  The next INR is due 11/15/2009.  Anticoagulation instructions were given to patient.  Results were reviewed/authorized by Weston Brass, PharmD.  He was notified by Kennieth Francois.         Prior Anticoagulation Instructions: INR 2.9  Continue same dose of 1 tablet every day except 1/2 tablet on Sunday and Thursday.  Recheck INR in 4 weeks.   Current Anticoagulation Instructions: INR 2.2  Continue taking one tablet every day except one-half on Sunday and Thursday.   We will see you in four weeks.

## 2010-03-05 NOTE — Progress Notes (Signed)
Summary: new rx requested Omeprazole  Phone Note Call from Patient Call back at Home Phone [phone]   Caller: Spouse----live call Summary of Call: pt has been taking 2 of the Prilosec on his own without Dr's knowledge.  Now, he is out of prilosec, which the directions call for 1 a day. He is 2 days short of his next refill.  pt would like to know can a new rx be called in for 2 day to [facility] In [place]? Initial call taken by: Warnell Forester,  October 15, 2009 3:30 PM  Follow-up for Phone Call        OK to increase to Prilosec one by mouth two times a day .  refill for 6 months. Follow-up by: Evelena Peat MD,  October 15, 2009 5:22 PM  Additional Follow-up for Phone Call Additional follow up Details #1::        Pt informed Additional Follow-up by: Sid Falcon LPN,  October 16, 2009 9:29 AM    New/Updated Medications: OMEPRAZOLE 20 MG CPDR (OMEPRAZOLE) one by mouth two times a day Prescriptions: OMEPRAZOLE 20 MG CPDR (OMEPRAZOLE) one by mouth two times a day  #60 x 6   Entered by:   Sid Falcon LPN   Authorized by:   Evelena Peat MD   Signed by:   Sid Falcon LPN on 46/96/2952   Method used:   Electronically to        [facility]* (retail)       [address] [place], [place]  [address]       Ph: [phone] or [phone]       Fax: [phone]   RxID:   [id number]

## 2010-03-05 NOTE — Cardiovascular Report (Signed)
Summary: Office Visit Remote   Office Visit Remote   Imported By: Roderic Ovens 08/22/2009 16:11:37  _____________________________________________________________________  External Attachment:    Type:   Image     Comment:   External Document

## 2010-03-05 NOTE — Medication Information (Signed)
Summary: rov/ewj  Anticoagulant Therapy  Managed by: Weston Brass, PharmD Referring MD: [name] MD PCP: Evelena Peat MD Supervising MD: Jens Som MD, Arlys John Indication 1: Atrial Fibrillation (ICD-427.31) Indication 2: deep vein thrombosis--arm (ICD-451.19) Lab Used: [facility] Site: [facility] INR RANGE 2 - 3  Dietary changes: no    Health status changes: no    Bleeding/hemorrhagic complications: no    Recent/future hospitalizations: no    Any changes in medication regimen? no    Recent/future dental: no  Any missed doses?: no       Is patient compliant with meds? yes       Allergies: 1)  ! Penicillin V Potassium (Penicillin V Potassium) 2)  ! * Cloth Tape 3)  ! Soma 4)  ! Zolpidem Tartrate 5)  ! * Lisinopril  Anticoagulation Management History:      The patient is taking warfarin and comes in today for a routine follow up visit.  Positive risk factors for bleeding include an age of 75 years or older and presence of serious comorbidities.  The bleeding index is 'intermediate risk'.  Positive CHADS2 values include History of CHF, History of HTN, and Age > 5 years old.  The start date was 11/17/2005.  His last INR was 2.1.  Anticoagulation responsible provider: Jens Som MD, Arlys John.  Cuvette Lot#: [id number].  Exp: 11/2010.    Anticoagulation Management Assessment/Plan:      The patient's current anticoagulation dose is Warfarin sodium 5 mg tabs: as directed.  The target INR is 2 - 3.  The next INR is due 09/21/2009.  Anticoagulation instructions were given to patient.  Results were reviewed/authorized by Weston Brass, PharmD.  He was notified by Dillard Cannon.         Prior Anticoagulation Instructions: INR 1.7  Take 1.5 tablets today,  then start taking 1 tablet daily except 1/2 tablet on Sundays and Thursdays.  Recheck in 2 weeks.    Current Anticoagulation Instructions: INR 2.3  Continue same dose of 1/2 tab on Sunday and Thursday and 1 tab every other day.   Re-check in 4 weeks.

## 2010-03-05 NOTE — Medication Information (Signed)
Summary: rov/jm  Anticoagulant Therapy  Managed by: Reina Fuse, PharmD Referring MD: [name] MD PCP: Evelena Peat MD Supervising MD: Ladona Ridgel MD, Sharlot Gowda Indication 1: Atrial Fibrillation (ICD-427.31) Indication 2: deep vein thrombosis--arm (ICD-451.19) Lab Used: [facility] Site: [facility] INR POC 2.9 INR RANGE 2 - 3  Dietary changes: no    Health status changes: no    Bleeding/hemorrhagic complications: no    Recent/future hospitalizations: no    Any changes in medication regimen? no    Recent/future dental: no  Any missed doses?: no       Is patient compliant with meds? yes       Current Medications (verified): 1)  Warfarin Sodium 5 Mg Tabs (Warfarin Sodium) .... As Directed 2)  Omeprazole 20 Mg Cpdr (Omeprazole) .... One By Mouth Two Times A Day 3)  Furosemide 40 Mg Tabs (Furosemide) .... Take One Twice Daily 4)  Fenofibrate 160 Mg Tabs (Fenofibrate) .... Once Daily 5)  Simvastatin 40 Mg Tabs (Simvastatin) .... Once Daily At [facility] 6)  Flomax 0.4 Mg Cp24 (Tamsulosin Hcl) .... Take 1 Capsule By Mouth Nightly 7)  Isosorbide Mononitrate Cr 60 Mg Xr24h-Tab (Isosorbide Mononitrate) .... Take One Tablet By Mouth Daily 8)  Nitroglycerin 0.4 Mg Subl (Nitroglycerin) .... One Tablet Under Tongue Every 5 Minutes As Needed For Chest Pain---May Repeat Times Three 9)  Stool Softener 100 Mg Caps (Docusate Sodium) .... As Needed 10)  Tylenol Extra Strength 500 Mg Tabs (Acetaminophen) .... As Needed 11)  Nasacort Aq 55 Mcg/act Aers (Triamcinolone Acetonide(Nasal)) .... As Needed 12)  Aspirin 81 Mg Tbec (Aspirin) .... Take One Tablet By Mouth Daily 13)  Alprazolam 0.25 Mg Tabs (Alprazolam) .... Take Daily As Needed 14)  Carvedilol 3.125 Mg Tabs (Carvedilol) .... Take One Twice Daily 15)  Losartan Potassium 50 Mg Tabs (Losartan Potassium) .... Take 1/2 Tablet Daily 16)  Vitamin B-12 500 Mcg Tabs (Cyanocobalamin) .... Take 1 Tablet By Mouth Once A Day  Allergies (verified): 1)   ! Penicillin V Potassium (Penicillin V Potassium) 2)  ! * Cloth Tape 3)  ! Soma 4)  ! Zolpidem Tartrate 5)  ! * Lisinopril  Anticoagulation Management History:      The patient is taking warfarin and comes in today for a routine follow up visit.  Positive risk factors for bleeding include an age of 7 years or older and presence of serious comorbidities.  The bleeding index is 'intermediate risk'.  Positive CHADS2 values include History of CHF, History of HTN, and Age > 48 years old.  The start date was 11/17/2005.  His last INR was 2.1.  Anticoagulation responsible Nakesha Ebrahim: Ladona Ridgel MD, Sharlot Gowda.  INR POC: 2.9.  Cuvette Lot#: [id number].  Exp: 11/2010.    Anticoagulation Management Assessment/Plan:      The patient's current anticoagulation dose is Warfarin sodium 5 mg tabs: as directed.  The target INR is 2 - 3.  The next INR is due 12/13/2009.  Anticoagulation instructions were given to patient.  Results were reviewed/authorized by Reina Fuse, PharmD.  He was notified by Reina Fuse PharmD.         Prior Anticoagulation Instructions: INR 2.2  Continue taking one tablet every day except one-half on Sunday and Thursday.  We will see you in four weeks.    Current Anticoagulation Instructions: INR 2.9  Continue taking Coumadin 5 mg (1 tab) on all days except Coumadin 2.5 mg (0.5 tab) on Sundays and Thursdays.  Return to clinic in 4 weeks.

## 2010-03-05 NOTE — Letter (Signed)
Summary: [facility]  [facility]   Imported By: Maryln Gottron 07/20/2009 10:05:45  _____________________________________________________________________  External Attachment:    Type:   Image     Comment:   External Document

## 2010-03-05 NOTE — Medication Information (Signed)
Summary: rov/eac  Anticoagulant Therapy  Managed by: Weston Brass, PharmD Referring MD: [name] MD PCP: Evelena Peat MD Supervising MD: Tenny Craw MD, Gunnar Fusi Indication 1: Atrial Fibrillation (ICD-427.31) Indication 2: deep vein thrombosis--arm (ICD-451.19) Lab Used: [facility] Site: [facility] INR POC 2.2 INR RANGE 2 - 3  Dietary changes: no    Health status changes: no    Bleeding/hemorrhagic complications: no    Recent/future hospitalizations: no    Any changes in medication regimen? no    Recent/future dental: no  Any missed doses?: no       Is patient compliant with meds? yes       Allergies: 1)  ! Penicillin V Potassium (Penicillin V Potassium) 2)  ! * Cloth Tape 3)  ! Soma 4)  ! Zolpidem Tartrate  Anticoagulation Management History:      The patient is taking warfarin and comes in today for a routine follow up visit.  Positive risk factors for bleeding include an age of 75 years or older and presence of serious comorbidities.  The bleeding index is 'intermediate risk'.  Positive CHADS2 values include History of CHF, History of HTN, and Age > 75 years old.  The start date was 11/17/2005.  His last INR was 2.1.  Anticoagulation responsible provider: Tenny Craw MD, Gunnar Fusi.  INR POC: 2.2.  Cuvette Lot#: [id number].  Exp: 09/2010.    Anticoagulation Management Assessment/Plan:      The patient's current anticoagulation dose is Warfarin sodium 5 mg tabs: as directed.  The target INR is 2 - 3.  The next INR is due 08/10/2009.  Anticoagulation instructions were given to patient.  Results were reviewed/authorized by Weston Brass, PharmD.  He was notified by Weston Brass PharmD.         Prior Anticoagulation Instructions: INR 2.3  Continue taking 1/2 tablet on Sunday, Tuesday, and Thursday, and 1 tablet all other days.  Return to clinic in 4 weeks.    Current Anticoagulation Instructions: INR 2.2  Continue same dose of 1 tablet every day except 1/2 tablet on Sunday, Tuesday  and Thursday.

## 2010-03-05 NOTE — Assessment & Plan Note (Signed)
Summary: 1 month   Visit Type:  1 month fo.llow up Primary Provider:  Evelena Peat MD  CC:  Cough.  History of Present Illness: Doing well.  Has a hacking cough, that is dry.  Had very brief pain, but not frequent.  Current Medications (verified): 1)  Warfarin Sodium 5 Mg Tabs (Warfarin Sodium) .... As Directed 2)  Omeprazole 20 Mg Cpdr (Omeprazole) .... One By Mouth Daily 3)  Furosemide 40 Mg Tabs (Furosemide) .... Take One Twice Daily 4)  Fenofibrate 160 Mg Tabs (Fenofibrate) .... Once Daily 5)  Simvastatin 40 Mg Tabs (Simvastatin) .... Once Daily At [facility] 6)  Flomax 0.4 Mg Cp24 (Tamsulosin Hcl) .... Take 1 Capsule By Mouth Nightly 7)  Imdur 60 Mg Xr24h-Tab (Isosorbide Mononitrate) .... Qd 8)  Nitroglycerin 0.4 Mg Subl (Nitroglycerin) .... One Tablet Under Tongue Every 5 Minutes As Needed For Chest Pain---May Repeat Times Three 9)  Stool Softener 100 Mg Caps (Docusate Sodium) .... As Needed 10)  Tylenol Extra Strength 500 Mg Tabs (Acetaminophen) .... As Needed 11)  Nasacort Aq 55 Mcg/act Aers (Triamcinolone Acetonide(Nasal)) .... As Needed 12)  Aspirin 325 Mg Tabs (Aspirin) .... Take One Tablet Daily 13)  Alprazolam 0.25 Mg Tabs (Alprazolam) .... Take Daily As Needed 14)  Carvedilol 3.125 Mg Tabs (Carvedilol) .... Take One Twice Daily 15)  Lisinopril 5 Mg Tabs (Lisinopril) .... Take One Twice Daily 16)  Loperamide Hcl 2 Mg Caps (Loperamide Hcl) .... Take As Needed  Allergies: 1)  ! Penicillin V Potassium (Penicillin V Potassium) 2)  ! * Cloth Tape 3)  ! Soma 4)  ! Zolpidem Tartrate  Vital Signs:  Patient profile:   77 year old male Height:      73 inches Weight:      198.75 pounds BMI:     26.32 Pulse rate:   72 / minute Pulse rhythm:   regular Resp:     18 per minute BP sitting:   96 / 49  (left arm) Cuff size:   large  Vitals Entered By: Vikki Ports (March 15, 2009 2:26 PM)  Physical Exam  General:  Well developed, well nourished, in no acute  distress. Lungs:  Clear bilaterally to auscultation and percussion. Heart:  PMI non displaced.  Apical Sys murmur, holosystolic murmur.   Abdomen:  Bowel sounds positive; abdomen soft and non-tender without masses, organomegaly, or hernias noted. No hepatosplenomegaly. Msk:  Back normal, normal gait. Muscle strength and tone normal. Extremities:  No clubbing or cyanosis.  Trace edema Neurologic:  Alert and oriented x 3.     Echocardiogram  Procedure date:  11/14/2008  Findings:       Left ventricle: The cavity size was severely dilated. Wall     thickness was normal. Systolic function was severely reduced. The     estimated ejection fraction was in the range of 20% to 25%.     Diffuse hypokinesis.   - Aortic valve: Mild regurgitation.   - Mitral valve: Moderate regurgitation.   - Left atrium: The atrium was moderately dilated.   - Right atrium: The atrium was mildly to moderately dilated.   - Tricuspid valve: Mild-moderate regurgitation.   - Pulmonary arteries: Systolic pressure was mildly increased. PA     peak pressure: 41mm Hg (S).   EKG  Procedure date:  11/14/2008  Findings:      atrial tracking. ventricular pacing.   ICD Specifications Following MD:  Sherryl Manges, MD     Referring MD:  [name] ICD  Vendor:  [employer]     ICD Model Number:  [id number]     ICD Serial Number:  [id number] ICD DOI:  10/30/2008     ICD Implanting MD:  Sherryl Manges, MD  Lead 1:    Location: RV     DOI: 03/17/2005     Model #: [id number]     Serial #: [id number]     Status: active Lead 2:    Location: RA     DOI: 10/30/2008     Model #: [id number]     Serial #: [id number]     Status: active Lead 3:    Location: LV     DOI: 10/30/2008     Model #: [id number]     Serial #: [id number]     Status: active  Indications::  ICM  Explantation Comments: 10/30/2008 [employer] [id number] explanted  ICD Follow Up ICD Dependent:  No       ICD Device Measurements Configuration: LV TIP TO RV COIL  Episodes Coumadin:   Yes  Brady Parameters Mode DDD     Lower Rate Limit:  60     Upper Rate Limit 120 PAV 160     Sensed AV Delay:  140  Tachy Zones VF:  240     VT:  214     VT1:  171     Impression & Recommendations:  Problem # 1:  CHRONIC SYSTOLIC HEART FAILURE (ICD-428.22)  Best he has been in a while.  Not short of breath.  Cough secondary to Vasotec.   His updated medication list for this problem includes:    Warfarin Sodium 5 Mg Tabs (Warfarin sodium) .Marland Kitchen... As directed    Furosemide 40 Mg Tabs (Furosemide) .Marland Kitchen... Take one twice daily    Imdur 60 Mg Xr24h-tab (Isosorbide mononitrate) ..... Qd    Nitroglycerin 0.4 Mg Subl (Nitroglycerin) ..... One tablet under tongue every 5 minutes as needed for chest pain---may repeat times three    Aspirin 81 Mg Tbec (Aspirin) .Marland Kitchen... Take one tablet by mouth daily    Carvedilol 3.125 Mg Tabs (Carvedilol) .Marland Kitchen... Take one twice daily    Lisinopril 5 Mg Tabs (Lisinopril) .Marland Kitchen... Take one twice daily  Orders: EKG w/ Interpretation (93000) Echocardiogram (Echo)  Problem # 2:  CARDIOMYOPATHY, ISCHEMIC (ICD-414.8)  Orders: EKG w/ Interpretation (93000) Echocardiogram (Echo)  Problem # 3:  HYPERTENSION (ICD-401.9) Reviewed BP meds.  Continue same. His updated medication list for this problem includes:    Furosemide 40 Mg Tabs (Furosemide) .Marland Kitchen... Take one twice daily    Aspirin 81 Mg Tbec (Aspirin) .Marland Kitchen... Take one tablet by mouth daily    Carvedilol 3.125 Mg Tabs (Carvedilol) .Marland Kitchen... Take one twice daily    Lisinopril 5 Mg Tabs (Lisinopril) .Marland Kitchen... Take one twice daily  Problem # 4:  HYPERLIPIDEMIA (ICD-272.4) Continue currently. His updated medication list for this problem includes:    Fenofibrate 160 Mg Tabs (Fenofibrate) ..... Once daily    Simvastatin 40 Mg Tabs (Simvastatin) ..... Once daily at hs  Patient Instructions: 1)  Your physician recommends that you schedule a follow-up appointment in: 2 MONTHS 2)  Your physician has requested that you have an  echocardiogram in 2 MONTHS.  Echocardiography is a painless test that uses sound waves to create images of your heart. It provides your doctor with information about the size and shape of your heart and how well your heart's chambers and valves are working.  This procedure takes approximately one hour. There are no  restrictions for this procedure. 3)  Your physician has recommended you make the following change in your medication: DECREASE Aspirin to 81mg  once a day

## 2010-03-05 NOTE — Assessment & Plan Note (Signed)
Summary: rash on back/njr   Vital Signs:  Patient profile:   77 year old male Weight:      199 pounds Temp:     98.5 degrees F oral BP sitting:   100 / 70  (left arm) Cuff size:   regular  Vitals Entered By: Sid Falcon LPN (February 14, 2009 2:53 PM) CC: rash on abd and back X 2 weeks   History of Present Illness: Chief complaint is pruritic rash left lower abdominal and back region in somewhat of a dermatome distribution. This started around 22 December. No clear precipitants. No blistering rash and no pain. No history of shingles in this region. He tried some sort of over-the-counter topical without much improvement. Patient noticed a few reddish dots in this region but no visible rash today.  Ischemic cardiomyopathy and recent hospitalization for acute congestive failure. Doing well from that standpoint. Medications reviewed and medications reconcilled in chart.  Allergies: 1)  ! Penicillin V Potassium (Penicillin V Potassium) 2)  ! * Cloth Tape 3)  ! Soma 4)  ! Zolpidem Tartrate  Past History:  Past Medical History: Last updated: 02/02/2009 Colonic polyps, hx of, 2006 Colonscopy Diverticulitis, hx of 2000 Colonscopy GERD Hyperlipidemia Hypertension Ischemic heart disease. Depressed left ventricular function.  Ventricular tachycardia. Status post implantable cardioverter-defibrillator with history of   infection and requiring explantation revision.-2010--[employer] unify CD 3231  Congestive heart failure - Class II to III.   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 Contributing   to the above.   Coronary artery disease  Chronic kidney disease.  History of pulmonary embolus.   Past Surgical History: Last updated: 02/02/2009 open heart 1991, 1997 Lung surg 1987 Hernia repair 1952, 2004 cholecystectomy. appendectomy.  right thoracotomy secondary to lung mass Pacemaker  insertion-[employer] Jude unify CD 3231/2010  Review of Systems      See HPI  Physical Exam  General:  Well-developed,well-nourished,in no acute distress; alert,appropriate and cooperative throughout examination Lungs:  Normal respiratory effort, chest expands symmetrically. Lungs are clear to auscultation, no crackles or wheezes. Heart:  normal rate and regular rhythm.   Skin:  his back and trunk and abdominal region are fully examined and no evidence for rash at this time. No localized tenderness.   Impression & Recommendations:  Problem # 1:  RASH AND OTHER NONSPECIFIC SKIN ERUPTION (ICD-782.1)  no evidence for shingles. No history of clear contact dermatitis. Triamcinolone cream for symptomatic relief and follow up promptly for any rashes noted  His updated medication list for this problem includes:    Triamcinolone Acetonide 0.5 % Crea (Triamcinolone acetonide) .Marland Kitchen... Apply to affected area two times a day as needed  Complete Medication List: 1)  Warfarin Sodium 5 Mg Tabs (Warfarin sodium) .... As directed 2)  Omeprazole 20 Mg Cpdr (Omeprazole) .... One by mouth daily 3)  Furosemide 40 Mg Tabs (Furosemide) .... Take one twice daily 4)  Fenofibrate 160 Mg Tabs (Fenofibrate) .... Once daily 5)  Simvastatin 40 Mg Tabs (Simvastatin) .... Once daily at hs 6)  Flomax 0.4 Mg Cp24 (Tamsulosin hcl) .... Take 1 capsule by mouth nightly 7)  Imdur 60 Mg Xr24h-tab (Isosorbide mononitrate) .... Qd 8)  Nitroglycerin 0.4 Mg Subl (Nitroglycerin) .... One tablet under tongue every 5 minutes as needed for chest pain---may repeat times three 9)  Stool Softener 100 Mg Caps (Docusate sodium) .... As needed 10)  Mucinex Dm 30-600 Mg Xr12h-tab (Dextromethorphan-guaifenesin) .... As needed 11)  Tylenol Extra  Strength 500 Mg Tabs (Acetaminophen) .... As needed 12)  Nasacort Aq 55 Mcg/act Aers (Triamcinolone acetonide(nasal)) .... As needed 13)  Aspirin 325 Mg Tabs (Aspirin) .... Take one tablet daily 14)   Alprazolam 0.25 Mg Tabs (Alprazolam) .... Take daily as needed 15)  Carvedilol 3.125 Mg Tabs (Carvedilol) .... Take one twice daily 16)  Lisinopril 5 Mg Tabs (Lisinopril) .... Take one twice daily 17)  Loperamide Hcl 2 Mg Caps (Loperamide hcl) .... Take as needed 18)  Triamcinolone Acetonide 0.5 % Crea (Triamcinolone acetonide) .... Apply to affected area two times a day as needed  Patient Instructions: 1)  Followup if he has any worsening rash or a new findings such as blisters Prescriptions: TRIAMCINOLONE ACETONIDE 0.5 % CREA (TRIAMCINOLONE ACETONIDE) apply to affected area two times a day as needed  #30 gm x 1   Entered and Authorized by:   Evelena Peat MD   Signed by:   Evelena Peat MD on 02/14/2009   Method used:   Electronically to        [facility]* (retail)       [address] [place], [place]  [address]       Ph: [phone] or [phone]       Fax: [phone]   RxID:   [id number]   Preventive Care Screening  Colonoscopy:    Date:  08/13/2005    Results:  abnormal

## 2010-03-05 NOTE — Progress Notes (Signed)
Summary: refill Omeprazole X 1 year  Phone Note Refill Request Message from:  Fax from Pharmacy  Refills Requested: Medication #1:  OMEPRAZOLE 20 MG CPDR one by mouth daily   Last Refilled: 01/29/2009 [facility] [phone]     fax---[phone]   Method Requested: Electronic Initial call taken by: Warnell Forester,  March 02, 2009 9:37 AM    Prescriptions: OMEPRAZOLE 20 MG CPDR (OMEPRAZOLE) one by mouth daily  #90 x 3   Entered by:   Sid Falcon LPN   Authorized by:   Evelena Peat MD   Signed by:   Sid Falcon LPN on 40/34/7425   Method used:   Electronically to        [facility]* (retail)       [address] [place], [place]  [address]       Ph: [phone] or [phone]       Fax: [phone]   RxID:   [id number]

## 2010-03-05 NOTE — Assessment & Plan Note (Signed)
Summary: 2 month rov   Visit Type:  2 months follow up Primary Provider:  Evelena Peat MD  CC:  No complains.  History of Present Illness: Overall doing well.  No chest pain.  Better than usual.  No SOB.  No defib discharge.  Current Medications (verified): 1)  Warfarin Sodium 5 Mg Tabs (Warfarin Sodium) .... As Directed 2)  Omeprazole 20 Mg Cpdr (Omeprazole) .... One By Mouth Daily 3)  Furosemide 40 Mg Tabs (Furosemide) .... Take One Twice Daily 4)  Fenofibrate 160 Mg Tabs (Fenofibrate) .... Once Daily 5)  Simvastatin 40 Mg Tabs (Simvastatin) .... Once Daily At [facility] 6)  Flomax 0.4 Mg Cp24 (Tamsulosin Hcl) .... Take 1 Capsule By Mouth Nightly 7)  Isosorbide Mononitrate Cr 60 Mg Xr24h-Tab (Isosorbide Mononitrate) .... Take One Tablet By Mouth Daily 8)  Nitroglycerin 0.4 Mg Subl (Nitroglycerin) .... One Tablet Under Tongue Every 5 Minutes As Needed For Chest Pain---May Repeat Times Three 9)  Stool Softener 100 Mg Caps (Docusate Sodium) .... As Needed 10)  Tylenol Extra Strength 500 Mg Tabs (Acetaminophen) .... As Needed 11)  Nasacort Aq 55 Mcg/act Aers (Triamcinolone Acetonide(Nasal)) .... As Needed 12)  Aspirin 81 Mg Tbec (Aspirin) .... Take One Tablet By Mouth Daily 13)  Alprazolam 0.25 Mg Tabs (Alprazolam) .... Take Daily As Needed 14)  Carvedilol 3.125 Mg Tabs (Carvedilol) .... Take One Twice Daily 15)  Lisinopril 5 Mg Tabs (Lisinopril) .... Take One Twice Daily  Allergies: 1)  ! Penicillin V Potassium (Penicillin V Potassium) 2)  ! * Cloth Tape 3)  ! Soma 4)  ! Zolpidem Tartrate  Vital Signs:  Patient profile:   77 year old male Height:      73 inches Weight:      198 pounds BMI:     26.22 Pulse rate:   68 / minute Pulse rhythm:   irregular Resp:     18 per minute BP sitting:   104 / 60  (left arm) Cuff size:   large  Vitals Entered By: Vikki Ports (May 14, 2009 11:36 AM)  Physical Exam  General:  Well developed, well nourished, in no acute distress. Head:   normocephalic and atraumatic Eyes:  PERRLA/EOM intact; conjunctiva and lids normal. Neck:  No JVD. Lungs:  Clear bilaterally to auscultation and percussion. Heart:  PMI laterally displaced.  Soft apical murmur consistent with MR.  Abdomen:  Bowel sounds positive; abdomen soft and non-tender without masses, organomegaly, or hernias noted. No hepatosplenomegaly. Pulses:  pulses normal in all 4 extremities Extremities:  No clubbing or cyanosis. Neurologic:  Alert and oriented x 3.    ICD Specifications Following MD:  Sherryl Manges, MD     Referring MD:  [facility] ICD Vendor:  [facility]     ICD Model Number:  [id number]     ICD Serial Number:  [id number] ICD DOI:  10/30/2008     ICD Implanting MD:  Sherryl Manges, MD  Lead 1:    Location: RV     DOI: 03/17/2005     Model #: [id number]     Serial #: [id number]     Status: active Lead 2:    Location: RA     DOI: 10/30/2008     Model #: [id number]     Serial #: [id number]     Status: active Lead 3:    Location: LV     DOI: 10/30/2008     Model #: 1258T     Serial #: [id number]  Status: active  Indications::  ICM  Explantation Comments: 10/30/2008 [employer] [id number] explanted  ICD Follow Up ICD Dependent:  No       ICD Device Measurements Configuration: LV TIP TO RV COIL  Episodes Coumadin:  Yes  Brady Parameters Mode DDD     Lower Rate Limit:  60     Upper Rate Limit 120 PAV 160     Sensed AV Delay:  140  Tachy Zones VF:  240     VT:  214     VT1:  171     Impression & Recommendations:  Problem # 1:  CHRONIC SYSTOLIC HEART FAILURE (ICD-428.22)  Hemodynamically stable, better than average symptom wise.  Prior class III, now class II by symptoms.  His updated medication list for this problem includes:    Warfarin Sodium 5 Mg Tabs (Warfarin sodium) .Marland Kitchen... As directed    Furosemide 40 Mg Tabs (Furosemide) .Marland Kitchen... Take one twice daily    Isosorbide Mononitrate Cr 60 Mg Xr24h-tab (Isosorbide mononitrate) .Marland Kitchen... Take one tablet by mouth daily     Nitroglycerin 0.4 Mg Subl (Nitroglycerin) ..... One tablet under tongue every 5 minutes as needed for chest pain---may repeat times three    Aspirin 81 Mg Tbec (Aspirin) .Marland Kitchen... Take one tablet by mouth daily    Carvedilol 3.125 Mg Tabs (Carvedilol) .Marland Kitchen... Take one twice daily    Lisinopril 5 Mg Tabs (Lisinopril) .Marland Kitchen... Take one twice daily  Orders: TLB-BMP (Basic Metabolic Panel-BMET) (80048-METABOL) TLB-Hepatic/Liver Function Pnl (80076-HEPATIC) TLB-Lipid Panel (80061-LIPID) TLB-BNP (B-Natriuretic Peptide) (83880-BNPR)  His updated medication list for this problem includes:    Warfarin Sodium 5 Mg Tabs (Warfarin sodium) .Marland Kitchen... As directed    Furosemide 40 Mg Tabs (Furosemide) .Marland Kitchen... Take one twice daily    Isosorbide Mononitrate Cr 60 Mg Xr24h-tab (Isosorbide mononitrate) .Marland Kitchen... Take one tablet by mouth daily    Nitroglycerin 0.4 Mg Subl (Nitroglycerin) ..... One tablet under tongue every 5 minutes as needed for chest pain---may repeat times three    Aspirin 81 Mg Tbec (Aspirin) .Marland Kitchen... Take one tablet by mouth daily    Carvedilol 3.125 Mg Tabs (Carvedilol) .Marland Kitchen... Take one twice daily    Lisinopril 5 Mg Tabs (Lisinopril) .Marland Kitchen... Take one twice daily  Problem # 2:  CARDIOMYOPATHY, ISCHEMIC (ICD-414.8) See above His updated medication list for this problem includes:    Warfarin Sodium 5 Mg Tabs (Warfarin sodium) .Marland Kitchen... As directed    Furosemide 40 Mg Tabs (Furosemide) .Marland Kitchen... Take one twice daily    Isosorbide Mononitrate Cr 60 Mg Xr24h-tab (Isosorbide mononitrate) .Marland Kitchen... Take one tablet by mouth daily    Nitroglycerin 0.4 Mg Subl (Nitroglycerin) ..... One tablet under tongue every 5 minutes as needed for chest pain---may repeat times three    Aspirin 81 Mg Tbec (Aspirin) .Marland Kitchen... Take one tablet by mouth daily    Carvedilol 3.125 Mg Tabs (Carvedilol) .Marland Kitchen... Take one twice daily    Lisinopril 5 Mg Tabs (Lisinopril) .Marland Kitchen... Take one twice daily  Problem # 3:  HYPERLIPIDEMIA (ICD-272.4) Last LDL 75mg /dl.   Numbers reviewed. Will recheck. His updated medication list for this problem includes:    Fenofibrate 160 Mg Tabs (Fenofibrate) ..... Once daily    Simvastatin 40 Mg Tabs (Simvastatin) ..... Once daily at hs  Problem # 4:  RENAL INSUFFICIENCY (ICD-588.9) Being followed at this point by Dr. Caryn Section.  Problem # 5:  HYPERTENSION (ICD-401.9)  [place]rolled. His updated medication list for this problem includes:    Furosemide 40 Mg Tabs (Furosemide) .Marland KitchenMarland KitchenMarland KitchenMarland Kitchen  Take one twice daily    Aspirin 81 Mg Tbec (Aspirin) .Marland Kitchen... Take one tablet by mouth daily    Carvedilol 3.125 Mg Tabs (Carvedilol) .Marland Kitchen... Take one twice daily    Lisinopril 5 Mg Tabs (Lisinopril) .Marland Kitchen... Take one twice daily  His updated medication list for this problem includes:    Furosemide 40 Mg Tabs (Furosemide) .Marland Kitchen... Take one twice daily    Aspirin 81 Mg Tbec (Aspirin) .Marland Kitchen... Take one tablet by mouth daily    Carvedilol 3.125 Mg Tabs (Carvedilol) .Marland Kitchen... Take one twice daily    Lisinopril 5 Mg Tabs (Lisinopril) .Marland Kitchen... Take one twice daily  Patient Instructions: 1)  Your physician recommends that you have lab work today: BMET, Lipid, Liver, BNP 2)  Your physician recommends that you schedule a follow-up appointment in: 3 months 3)  Your physician recommends that you continue on your current medications as directed. Please refer to the Current Medication list given to you today.

## 2010-03-05 NOTE — Medication Information (Signed)
Summary: rov/ewj  Anticoagulant Therapy  Managed by: Eda Keys, PharmD Referring MD: [name] MD PCP: Evelena Peat MD Supervising MD: Myrtis Ser MD, Tinnie Gens Indication 1: Atrial Fibrillation (ICD-427.31) Indication 2: deep vein thrombosis--arm (ICD-451.19) Lab Used: [facility] Site: [facility] INR POC 2.3 INR RANGE 2 - 3  Dietary changes: no    Health status changes: no    Bleeding/hemorrhagic complications: no    Recent/future hospitalizations: no    Any changes in medication regimen? no    Recent/future dental: no  Any missed doses?: yes     Details: missed 1 tablet, but took an extra 1/2 tablet the next day  Is patient compliant with meds? yes       Allergies: 1)  ! Penicillin V Potassium (Penicillin V Potassium) 2)  ! * Cloth Tape 3)  ! Soma 4)  ! Zolpidem Tartrate  Anticoagulation Management History:      The patient is taking warfarin and comes in today for a routine follow up visit.  Positive risk factors for bleeding include an age of 75 years or older and presence of serious comorbidities.  The bleeding index is 'intermediate risk'.  Positive CHADS2 values include History of CHF, History of HTN, and Age > 41 years old.  The start date was 11/17/2005.  His last INR was 2.1.  Anticoagulation responsible provider: Myrtis Ser MD, Tinnie Gens.  INR POC: 2.3.  Cuvette Lot#: [id number].  Exp: 06/2010.    Anticoagulation Management Assessment/Plan:      The patient's current anticoagulation dose is Warfarin sodium 5 mg tabs: as directed.  The target INR is 2 - 3.  The next INR is due 07/09/2009.  Anticoagulation instructions were given to patient.  Results were reviewed/authorized by Eda Keys, PharmD.  He was notified by Eda Keys.         Prior Anticoagulation Instructions: INR 2.2  Continue on same dosage 1 tablet daily except 1/2 tablet on Sundays, Tuesdays, and Thursdays.  Recheck in 4 weeks.    Current Anticoagulation Instructions: INR  2.3  Continue taking 1/2 tablet on Sunday, Tuesday, and Thursday, and 1 tablet all other days.  Return to clinic in 4 weeks.

## 2010-03-05 NOTE — Cardiovascular Report (Signed)
Summary: Health Management Program Summary Report   Health Management Program Summary Report   Imported By: Roderic Ovens 03/27/2009 12:18:58  _____________________________________________________________________  External Attachment:    Type:   Image     Comment:   External Document

## 2010-03-05 NOTE — Medication Information (Signed)
Summary: rov/sl  Anticoagulant Therapy  Managed by: Reina Fuse, PharmD Referring MD: [name] MD PCP: Evelena Peat MD Supervising MD: Jens Som MD, Arlys John Indication 1: Atrial Fibrillation (ICD-427.31) Indication 2: deep vein thrombosis--arm (ICD-451.19) Lab Used: [facility] Site: [facility] INR POC 3.0 INR RANGE 2 - 3  Dietary changes: no    Health status changes: no    Bleeding/hemorrhagic complications: no    Recent/future hospitalizations: no    Any changes in medication regimen? no    Recent/future dental: no  Any missed doses?: no       Is patient compliant with meds? yes       Allergies: 1)  ! Penicillin V Potassium (Penicillin V Potassium) 2)  ! * Cloth Tape 3)  ! Soma 4)  ! Zolpidem Tartrate 5)  ! * Lisinopril  Anticoagulation Management History:      The patient is taking warfarin and comes in today for a routine follow up visit.  Positive risk factors for bleeding include an age of 15 years or older and presence of serious comorbidities.  The bleeding index is 'intermediate risk'.  Positive CHADS2 values include History of CHF, History of HTN, and Age > 48 years old.  The start date was 11/17/2005.  His last INR was 2.1.  Anticoagulation responsible provider: Jens Som MD, Arlys John.  INR POC: 3.0.  Cuvette Lot#: [id number].  Exp: 11/2010.    Anticoagulation Management Assessment/Plan:      The patient's current anticoagulation dose is Warfarin sodium 5 mg tabs: as directed.  The target INR is 2 - 3.  The next INR is due 01/10/2010.  Anticoagulation instructions were given to patient.  Results were reviewed/authorized by Reina Fuse, PharmD.  He was notified by Reina Fuse PharmD.         Prior Anticoagulation Instructions: INR 2.9  Continue taking Coumadin 5 mg (1 tab) on all days except Coumadin 2.5 mg (0.5 tab) on Sundays and Thursdays.  Return to clinic in 4 weeks.   Current Anticoagulation Instructions: INR 3.0  Continue taking Coumadin 0.5 tab  (2.5 mg) on Sun, Thur and Coumadin 1 tab (5 mg) all other days. Return to clinic in 4 weeks.

## 2010-03-07 NOTE — Assessment & Plan Note (Signed)
Summary: 3 month rov   Visit Type:  Follow-up Primary Provider:  Evelena Peat MD   History of Present Illness: Doing well.  No shortness of breath.  Feels good.  Denies any chest pain.  Watching his sodium carefully.  Had pacer check with Merlin yesterday.    Current Medications (verified): 1)  Warfarin Sodium 5 Mg Tabs (Warfarin Sodium) .... As Directed 2)  Omeprazole 20 Mg Cpdr (Omeprazole) .... One By Mouth Two Times A Day 3)  Furosemide 40 Mg Tabs (Furosemide) .... Take One Twice Daily 4)  Fenofibrate 160 Mg Tabs (Fenofibrate) .... Once Daily 5)  Simvastatin 40 Mg Tabs (Simvastatin) .... Once Daily At [facility] 6)  Flomax 0.4 Mg Cp24 (Tamsulosin Hcl) .... Take 1 Capsule By Mouth Nightly 7)  Isosorbide Mononitrate Cr 60 Mg Xr24h-Tab (Isosorbide Mononitrate) .... Take One Tablet By Mouth Daily 8)  Nitroglycerin 0.4 Mg Subl (Nitroglycerin) .... One Tablet Under Tongue Every 5 Minutes As Needed For Chest Pain---May Repeat Times Three 9)  Stool Softener 100 Mg Caps (Docusate Sodium) .... As Needed 10)  Tylenol Extra Strength 500 Mg Tabs (Acetaminophen) .... As Needed 11)  Nasacort Aq 55 Mcg/act Aers (Triamcinolone Acetonide(Nasal)) .... As Needed 12)  Aspirin 81 Mg Tbec (Aspirin) .... Take One Tablet By Mouth Daily 13)  Alprazolam 0.25 Mg Tabs (Alprazolam) .... Take Daily As Needed 14)  Carvedilol 3.125 Mg Tabs (Carvedilol) .... Take One Twice Daily 15)  Losartan Potassium 50 Mg Tabs (Losartan Potassium) .... Take 1/2 Tablet Daily 16)  Vitamin B-12 500 Mcg Tabs (Cyanocobalamin) .... Take 1 Tablet By Mouth Once A Day  Allergies (verified): 1)  ! Penicillin V Potassium (Penicillin V Potassium) 2)  ! * Cloth Tape 3)  ! Soma 4)  ! Zolpidem Tartrate 5)  ! * Lisinopril  Past History:  Past Medical History: Last updated: 02/02/2009 Colonic polyps, hx of, 2006 Colonscopy Diverticulitis, hx of 2000 Colonscopy GERD Hyperlipidemia Hypertension Ischemic heart disease. Depressed left  ventricular function.  Ventricular tachycardia. Status post implantable cardioverter-defibrillator with history of   infection and requiring explantation revision.-2010--[employer] unify CD 3231  Congestive heart failure - Class II to III.   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 Contributing   to the above.   Coronary artery disease  Chronic kidney disease.  History of pulmonary embolus.   Vital Signs:  Patient profile:   77 year old male Height:      73 inches Weight:      297 pounds Pulse rate:   64 / minute BP sitting:   120 / 60  (right arm)  Vitals Entered By: Jacquelin Hawking, CMA (February 08, 2010 12:16 PM)  Physical Exam  General:  Well developed, well nourished, in no acute distress. Head:  normocephalic and atraumatic Eyes:  PERRLA/EOM intact; conjunctiva and lids normal. Neck:  No JVD Lungs:  Clear bilaterally to auscultation and percussion. Heart:  PMI laterally displaced.  Holosystolic murmur laterally.  Minimal SEM.  No DM.   Pulses:  pulses normal in all 4 extremities Extremities:  No clubbing or cyanosis. Neurologic:  Alert and oriented x 3.   EKG  Procedure date:  02/08/2010  Findings:      atrial tracking and ventricular pacing.   ICD Specifications Following MD:  Sherryl Manges, MD     Referring MD:  [facility] ICD Vendor:  [facility]     ICD Model Number:  [id number]     ICD Serial Number:  [id number] ICD DOI:  10/30/2008     ICD Implanting MD:  Sherryl Manges, MD  Lead 1:    Location: RV     DOI: 03/17/2005     Model #: [id number]     Serial #: [id number]     Status: active Lead 2:    Location: RA     DOI: 10/30/2008     Model #: [id number]     Serial #: [id number]     Status: active Lead 3:    Location: LV     DOI: 10/30/2008     Model #: [id number]     Serial #: [id number]     Status: active  Indications::  ICM  Explantation Comments: 10/30/2008 [employer] [id number] explanted  ICD  Follow Up ICD Dependent:  No       ICD Device Measurements Configuration: LV TIP TO RV COIL  Episodes Coumadin:  Yes  Brady Parameters Mode DDD     Lower Rate Limit:  60     Upper Rate Limit 120 PAV 160     Sensed AV Delay:  140  Tachy Zones VF:  240     VT:  214     VT1:  171     Impression & Recommendations:  Problem # 1:  CARDIOMYOPATHY, ISCHEMIC (ICD-414.8) clinically stable at present.  Will check BMET.   His updated medication list for this problem includes:    Warfarin Sodium 5 Mg Tabs (Warfarin sodium) .Marland Kitchen... As directed    Furosemide 40 Mg Tabs (Furosemide) .Marland Kitchen... Take one twice daily    Isosorbide Mononitrate Cr 60 Mg Xr24h-tab (Isosorbide mononitrate) .Marland Kitchen... Take one tablet by mouth daily    Nitroglycerin 0.4 Mg Subl (Nitroglycerin) ..... One tablet under tongue every 5 minutes as needed for chest pain---may repeat times three    Aspirin 81 Mg Tbec (Aspirin) .Marland Kitchen... Take one tablet by mouth daily    Carvedilol 3.125 Mg Tabs (Carvedilol) .Marland Kitchen... Take one twice daily    Losartan Potassium 50 Mg Tabs (Losartan potassium) .Marland Kitchen... Take 1/2 tablet daily  Problem # 2:  MITRAL VALVE PROLAPSE, NON-RHEUMATIC (ICD-424.0) Due to annular diliation.  His updated medication list for this problem includes:    Furosemide 40 Mg Tabs (Furosemide) .Marland Kitchen... Take one twice daily    Isosorbide Mononitrate Cr 60 Mg Xr24h-tab (Isosorbide mononitrate) .Marland Kitchen... Take one tablet by mouth daily    Nitroglycerin 0.4 Mg Subl (Nitroglycerin) ..... One tablet under tongue every 5 minutes as needed for chest pain---may repeat times three    Carvedilol 3.125 Mg Tabs (Carvedilol) .Marland Kitchen... Take one twice daily    Losartan Potassium 50 Mg Tabs (Losartan potassium) .Marland Kitchen... Take 1/2 tablet daily  Problem # 3:  RENAL INSUFFICIENCY (ICD-588.9) need to check BMET.  Problem # 4:  HYPERTENSION (ICD-401.9) currently controlled His updated medication list for this problem includes:    Furosemide 40 Mg Tabs (Furosemide) .Marland Kitchen... Take  one twice daily    Aspirin 81 Mg Tbec (Aspirin) .Marland Kitchen... Take one tablet by mouth daily    Carvedilol 3.125 Mg Tabs (Carvedilol) .Marland Kitchen... Take one twice daily    Losartan Potassium 50 Mg Tabs (Losartan potassium) .Marland Kitchen... Take 1/2 tablet daily  Orders: EKG w/ Interpretation (93000) TLB-BMP (Basic Metabolic Panel-BMET) (80048-METABOL)  Problem # 5:  HYPERLIPIDEMIA (ICD-272.4) will eventually need to check lipid profile in the near future.  His updated medication list for this problem includes:    Fenofibrate 160 Mg Tabs (Fenofibrate) ..... Once daily    Simvastatin 40  Mg Tabs (Simvastatin) ..... Once daily at hs  Orders: EKG w/ Interpretation (93000) TLB-BMP (Basic Metabolic Panel-BMET) (80048-METABOL)  Patient Instructions: 1)  Your physician recommends that you have lab work today: BMP 2)  Your physician recommends that you continue on your current medications as directed. Please refer to the Current Medication list given to you today. 3)  Your physician wants you to follow-up in: 3 MONTHS.  You will receive a reminder letter in the mail two months in advance. If you don't receive a letter, please call our office to schedule the follow-up appointment.

## 2010-03-07 NOTE — Medication Information (Signed)
Summary: rov/sp  Anticoagulant Therapy  Managed by: Weston Brass, PharmD Referring MD: [name] MD PCP: Evelena Peat MD Supervising MD: Tenny Craw MD, Gunnar Fusi Indication 1: Atrial Fibrillation (ICD-427.31) Indication 2: deep vein thrombosis--arm (ICD-451.19) Lab Used: [facility] Site: [facility] INR POC 2.5 INR RANGE 2 - 3  Dietary changes: no    Health status changes: no    Bleeding/hemorrhagic complications: no    Recent/future hospitalizations: no    Any changes in medication regimen? no    Recent/future dental: no  Any missed doses?: no       Is patient compliant with meds? yes       Allergies: 1)  ! Penicillin V Potassium (Penicillin V Potassium) 2)  ! * Cloth Tape 3)  ! Soma 4)  ! Zolpidem Tartrate 5)  ! * Lisinopril  Anticoagulation Management History:      The patient is taking warfarin and comes in today for a routine follow up visit.  Positive risk factors for bleeding include an age of 75 years or older and presence of serious comorbidities.  The bleeding index is 'intermediate risk'.  Positive CHADS2 values include History of CHF, History of HTN, and Age > 60 years old.  The start date was 11/17/2005.  His last INR was 2.1.  Anticoagulation responsible provider: Tenny Craw MD, Gunnar Fusi.  INR POC: 2.5.  Cuvette Lot#: [id number].  Exp: 03/2011.    Anticoagulation Management Assessment/Plan:      The patient's current anticoagulation dose is Warfarin sodium 5 mg tabs: as directed.  The target INR is 2 - 3.  The next INR is due 03/08/2010.  Anticoagulation instructions were given to patient.  Results were reviewed/authorized by Weston Brass, PharmD.  He was notified by Stephannie Peters, PharmD Candidate .         Prior Anticoagulation Instructions: INR 2.1  Continue same dose of 1 tablet every day except 1/2 tablet on Sunday and Thursday.  Recheck INR in 4 weeks.   Current Anticoagulation Instructions: INR 2.5  Coumadin 5 mg tablets - Continue 1 tablet every day  except 1/2 tablet on Sundays and Thursdays

## 2010-03-08 ENCOUNTER — Encounter (INDEPENDENT_AMBULATORY_CARE_PROVIDER_SITE_OTHER): Payer: Medicare Other

## 2010-03-08 ENCOUNTER — Ambulatory Visit: Admit: 2010-03-08 | Payer: Self-pay

## 2010-03-08 ENCOUNTER — Encounter: Payer: Self-pay | Admitting: Internal Medicine

## 2010-03-08 DIAGNOSIS — Z7901 Long term (current) use of anticoagulants: Secondary | ICD-10-CM

## 2010-03-08 DIAGNOSIS — I4891 Unspecified atrial fibrillation: Secondary | ICD-10-CM

## 2010-03-08 LAB — CONVERTED CEMR LAB: POC INR: 2.8

## 2010-03-13 NOTE — Medication Information (Signed)
Summary: rov/ewj  Anticoagulant Therapy  Managed by: Cloyde Reams, RN, BSN Referring MD: [name] MD PCP: Evelena Peat MD Supervising MD: Tenny Craw MD, Gunnar Fusi Indication 1: Atrial Fibrillation (ICD-427.31) Indication 2: deep vein thrombosis--arm (ICD-451.19) Lab Used: [facility] Site: [facility] INR POC 2.8 INR RANGE 2 - 3  Dietary changes: no    Health status changes: no    Bleeding/hemorrhagic complications: no    Recent/future hospitalizations: no    Any changes in medication regimen? no    Recent/future dental: no  Any missed doses?: no       Is patient compliant with meds? yes       Allergies: 1)  ! Penicillin V Potassium (Penicillin V Potassium) 2)  ! * Cloth Tape 3)  ! Soma 4)  ! Zolpidem Tartrate 5)  ! * Lisinopril  Anticoagulation Management History:      The patient is taking warfarin and comes in today for a routine follow up visit.  Positive risk factors for bleeding include an age of 75 years or older and presence of serious comorbidities.  The bleeding index is 'intermediate risk'.  Positive CHADS2 values include History of CHF, History of HTN, and Age > 35 years old.  The start date was 11/17/2005.  His last INR was 2.1.  Anticoagulation responsible provider: Tenny Craw MD, Gunnar Fusi.  INR POC: 2.8.  Cuvette Lot#: [id number].  Exp: 03/2011.    Anticoagulation Management Assessment/Plan:      The patient's current anticoagulation dose is Warfarin sodium 5 mg tabs: as directed.  The target INR is 2 - 3.  The next INR is due 04/05/2010.  Anticoagulation instructions were given to patient.  Results were reviewed/authorized by Cloyde Reams, RN, BSN.  He was notified by Cloyde Reams RN.         Prior Anticoagulation Instructions: INR 2.5  Coumadin 5 mg tablets - Continue 1 tablet every day except 1/2 tablet on Sundays and Thursdays   Current Anticoagulation Instructions: INR 2.8  Continue on same dosage 1 tablet daily except 1/2 tablet on Sundays and  Thursdays.  Recheck in 4 weeks.

## 2010-03-15 ENCOUNTER — Other Ambulatory Visit: Payer: Self-pay | Admitting: Family Medicine

## 2010-03-15 DIAGNOSIS — I2589 Other forms of chronic ischemic heart disease: Secondary | ICD-10-CM

## 2010-03-17 ENCOUNTER — Encounter (INDEPENDENT_AMBULATORY_CARE_PROVIDER_SITE_OTHER): Payer: Self-pay | Admitting: *Deleted

## 2010-03-21 NOTE — Letter (Signed)
Summary: [employer] - Health Management  [employer] [facility]   Imported By: Marylou Mccoy 03/13/2010 15:55:29  _____________________________________________________________________  External Attachment:    Type:   Image     Comment:   External Document

## 2010-03-27 NOTE — Cardiovascular Report (Signed)
Summary: Health Management Program Status Report   Health Management Program Status Report   Imported By: Roderic Ovens 03/21/2010 11:32:36  _____________________________________________________________________  External Attachment:    Type:   Image     Comment:   External Document

## 2010-03-27 NOTE — Cardiovascular Report (Signed)
Summary: Office Visit Remote   Office Visit Remote   Imported By: Roderic Ovens 03/19/2010 15:20:37  _____________________________________________________________________  External Attachment:    Type:   Image     Comment:   External Document

## 2010-03-27 NOTE — Letter (Signed)
Summary: Remote Device Check  [employer], Main Office  [address]   [place], [place] [address]   Phone: [phone]  Fax: [phone]     March 17, 2010 MRN: [id number]   [facility] [address] [place], [place]  [address]   Dear Mr. Bebee,   Your remote transmission was recieved and reviewed by your physician.  All diagnostics were within normal limits for you.  __X___Your next transmission is scheduled for:  05-09-2010.  Please transmit at any time this day.  If you have a wireless device your transmission will be sent automatically.   Sincerely,  Vella Kohler

## 2010-03-27 NOTE — Cardiovascular Report (Signed)
Summary: Health Management Program Summary Report   Health Management Program Summary Report   Imported By: Roderic Ovens 03/18/2010 15:27:58  _____________________________________________________________________  External Attachment:    Type:   Image     Comment:   External Document

## 2010-04-02 NOTE — Cardiovascular Report (Signed)
Summary: Health Management Program Summary Report   Health Management Program Summary Report   Imported By: Roderic Ovens 03/27/2010 13:59:13  _____________________________________________________________________  External Attachment:    Type:   Image     Comment:   External Document

## 2010-04-02 NOTE — Letter (Signed)
Summary: [employer] - Health Management  [employer] - [facility]   Imported By: Marylou Mccoy 03/12/2010 16:30:42  _____________________________________________________________________  External Attachment:    Type:   Image     Comment:   External Document

## 2010-04-04 ENCOUNTER — Encounter: Payer: Self-pay | Admitting: Cardiology

## 2010-04-04 DIAGNOSIS — I4891 Unspecified atrial fibrillation: Secondary | ICD-10-CM

## 2010-04-04 DIAGNOSIS — I059 Rheumatic mitral valve disease, unspecified: Secondary | ICD-10-CM

## 2010-04-04 DIAGNOSIS — I480 Paroxysmal atrial fibrillation: Secondary | ICD-10-CM | POA: Insufficient documentation

## 2010-04-04 HISTORY — DX: Unspecified atrial fibrillation: I48.91

## 2010-04-05 ENCOUNTER — Encounter (INDEPENDENT_AMBULATORY_CARE_PROVIDER_SITE_OTHER): Payer: Medicare Other

## 2010-04-05 ENCOUNTER — Encounter: Payer: Self-pay | Admitting: Internal Medicine

## 2010-04-05 DIAGNOSIS — I4891 Unspecified atrial fibrillation: Secondary | ICD-10-CM

## 2010-04-05 DIAGNOSIS — Z7901 Long term (current) use of anticoagulants: Secondary | ICD-10-CM

## 2010-04-05 DIAGNOSIS — I80299 Phlebitis and thrombophlebitis of other deep vessels of unspecified lower extremity: Secondary | ICD-10-CM

## 2010-04-11 NOTE — Medication Information (Signed)
Summary: rov/ewj  Anticoagulant Therapy  Managed by: Bayard Hugger, PharmD Referring MD: [name] MD PCP: Evelena Peat MD Supervising MD: Tenny Craw MD, Gunnar Fusi Indication 1: Atrial Fibrillation (ICD-427.31) Indication 2: deep vein thrombosis--arm (ICD-451.19) Lab Used: [facility] Site: [facility] INR RANGE 2 - 3  Dietary changes: no    Health status changes: no    Bleeding/hemorrhagic complications: no    Recent/future hospitalizations: no    Any changes in medication regimen? no    Recent/future dental: no  Any missed doses?: no       Is patient compliant with meds? yes       Allergies: 1)  ! Penicillin V Potassium (Penicillin V Potassium) 2)  ! * Cloth Tape 3)  ! Soma 4)  ! Zolpidem Tartrate 5)  ! * Lisinopril  Anticoagulation Management History:      Positive risk factors for bleeding include an age of 75 years or older and presence of serious comorbidities.  The bleeding index is 'intermediate risk'.  Positive CHADS2 values include History of CHF, History of HTN, and Age > 75 years old.  The start date was 11/17/2005.  His last INR was 2.1 and today's INR is 2.9.  Anticoagulation responsible provider: Tenny Craw MD, Gunnar Fusi.  Cuvette Lot#: [id number].  Exp: 02/2011.    Anticoagulation Management Assessment/Plan:      The patient's current anticoagulation dose is Warfarin sodium 5 mg tabs: as directed.  The target INR is 2 - 3.  The next INR is due 05/03/2010.  Anticoagulation instructions were given to patient.  Results were reviewed/authorized by Bayard Hugger, PharmD.         Prior Anticoagulation Instructions: INR 2.8  Continue on same dosage 1 tablet daily except 1/2 tablet on Sundays and Thursdays.  Recheck in 4 weeks.   Current Anticoagulation Instructions: INR 2.9 continue current dose: 1 tablet (5mg ) every day except for Sunday and Thursdays to take 0.5 tablet (2.5mg ) Recheck INR in 4 weeks.

## 2010-04-16 NOTE — Cardiovascular Report (Signed)
Summary: Health Management Program Summary Report   Health Management Program Summary Report   Imported By: Roderic Ovens 04/11/2010 15:24:24  _____________________________________________________________________  External Attachment:    Type:   Image     Comment:   External Document

## 2010-04-24 ENCOUNTER — Telehealth: Payer: Self-pay | Admitting: Family Medicine

## 2010-04-24 NOTE — Telephone Encounter (Signed)
Pt has sore throat,cough,headache. Pt is req work in appt tomorrow afternoon at [employer] or later. Pls advise if ok.

## 2010-04-24 NOTE — Telephone Encounter (Signed)
Ok to work in [place] for this problem only.  Thanks nn

## 2010-04-25 ENCOUNTER — Ambulatory Visit (INDEPENDENT_AMBULATORY_CARE_PROVIDER_SITE_OTHER): Payer: Medicare Other | Admitting: Family Medicine

## 2010-04-25 ENCOUNTER — Encounter: Payer: Self-pay | Admitting: Family Medicine

## 2010-04-25 VITALS — BP 120/70 | Temp 99.7°F | Ht 70.5 in | Wt 203.0 lb

## 2010-04-25 DIAGNOSIS — B9789 Other viral agents as the cause of diseases classified elsewhere: Secondary | ICD-10-CM

## 2010-04-25 DIAGNOSIS — B349 Viral infection, unspecified: Secondary | ICD-10-CM

## 2010-04-25 MED ORDER — HYDROCODONE-HOMATROPINE 5-1.5 MG/5ML PO SYRP: 5.0000 mL | ORAL_SOLUTION | Freq: Four times a day (QID) | ORAL | Status: AC | PRN

## 2010-04-25 NOTE — Progress Notes (Signed)
  Subjective:    Patient ID: Sean [name], male    DOB: 11/01/1931, 77 y.o.   MRN: [id number]  HPI   Patient has history of multiple chronic problems including CAD, systolic heart failure , ischemic cardiomyopathy , hypertension, hyperlipidemia, chronic kidney disease , and atrial fibrillation. Seen with 2 day history of symptoms including rhinitis , sore throat, headache, and productive cough. Low-grade fever today. He has chronic dyspnea which is unchanged. Wife has similar symptoms. Denies nausea or vomiting. No chest pain. Cough is especially bothersome at night.  Review of Systems  Constitutional: Negative for activity change.  HENT: Positive for congestion, sore throat, rhinorrhea and postnasal drip. Negative for trouble swallowing.   Respiratory: Positive for cough and shortness of breath. Negative for wheezing.   Cardiovascular: Negative for chest pain and leg swelling.  Gastrointestinal: Negative for vomiting and diarrhea.  Genitourinary: Negative for dysuria.       Objective:   Physical Exam  Constitutional: He appears well-developed and well-nourished.  HENT:  Head: Normocephalic and atraumatic.  Neck: Normal range of motion. Neck supple.  Cardiovascular: Normal rate and regular rhythm.   Pulmonary/Chest: Effort normal and breath sounds normal. No respiratory distress. He has no wheezes. He has no rales.  Musculoskeletal: He exhibits no edema.  Lymphadenopathy:    He has no cervical adenopathy.  Skin: No rash noted.          Assessment & Plan:   Probable viral syndrome. Exam unremarkable. Hycodan for cough suppression at night. Consider antibiotic if respiratory symptoms worsen but this point no indicators of pneumonia

## 2010-04-25 NOTE — Patient Instructions (Signed)
Call if you have any worsening symptoms such as increased shortness of breath or increased fever.

## 2010-04-25 NOTE — Telephone Encounter (Signed)
Pt will come in today @ 4:30 per Harriett Sine.

## 2010-05-03 ENCOUNTER — Ambulatory Visit (INDEPENDENT_AMBULATORY_CARE_PROVIDER_SITE_OTHER): Payer: Medicare Other | Admitting: *Deleted

## 2010-05-03 DIAGNOSIS — I059 Rheumatic mitral valve disease, unspecified: Secondary | ICD-10-CM

## 2010-05-03 DIAGNOSIS — Z7901 Long term (current) use of anticoagulants: Secondary | ICD-10-CM | POA: Insufficient documentation

## 2010-05-03 DIAGNOSIS — I4891 Unspecified atrial fibrillation: Secondary | ICD-10-CM

## 2010-05-03 LAB — POCT INR: INR: 4

## 2010-05-03 NOTE — Patient Instructions (Signed)
Skip today's dosage of Coumadin, then resume same dosage 1 tablet daily except 1/2 tablet on Sundays and Thursdays.  Recheck in 2-3 weeks.

## 2010-05-04 ENCOUNTER — Other Ambulatory Visit: Payer: Self-pay | Admitting: Cardiology

## 2010-05-06 LAB — BASIC METABOLIC PANEL
BUN: 29 mg/dL — ABNORMAL HIGH (ref 6–23)
Calcium: 9 mg/dL (ref 8.4–10.5)
Calcium: 9.2 mg/dL (ref 8.4–10.5)
Chloride: 101 mEq/L (ref 96–112)
Chloride: 102 mEq/L (ref 96–112)
Creatinine, Ser: 1.72 mg/dL — ABNORMAL HIGH (ref 0.4–1.5)
GFR calc Af Amer: 47 mL/min — ABNORMAL LOW (ref >60)
GFR calc Af Amer: 48 mL/min — ABNORMAL LOW (ref >60)
GFR calc non Af Amer: 34 mL/min — ABNORMAL LOW (ref >60)
GFR calc non Af Amer: 39 mL/min — ABNORMAL LOW (ref >60)
Glucose, Bld: 127 mg/dL — ABNORMAL HIGH (ref 70–99)
Potassium: 3.4 mEq/L — ABNORMAL LOW (ref 3.5–5.1)
Potassium: 4.4 mEq/L (ref 3.5–5.1)
Sodium: 138 mEq/L (ref 135–145)
Sodium: 139 mEq/L (ref 135–145)

## 2010-05-06 LAB — CARDIAC PANEL(CRET KIN+CKTOT+MB+TROPI)
CK, MB: 1.5 ng/mL (ref 0.3–4.0)
Relative Index: 1.5 (ref 0.0–2.5)
Total CK: 100 U/L (ref 7–232)
Troponin I: 0.06 ng/mL (ref 0.00–0.06)
Troponin I: 0.07 ng/mL — ABNORMAL HIGH (ref 0.00–0.06)

## 2010-05-06 LAB — CBC
HCT: 45.8 % (ref 39.0–52.0)
Hemoglobin: 14.5 g/dL (ref 13.0–17.0)
Hemoglobin: 15.2 g/dL (ref 13.0–17.0)
MCHC: 33.6 g/dL (ref 30.0–36.0)
MCHC: 33.7 g/dL (ref 30.0–36.0)
MCV: 89.5 fL (ref 78.0–100.0)
MCV: 89.8 fL (ref 78.0–100.0)
MCV: 90.3 fL (ref 78.0–100.0)
Platelets: 98 10*3/uL — ABNORMAL LOW (ref 150–400)
RBC: 5.06 MIL/uL (ref 4.22–5.81)
RDW: 15.2 % (ref 11.5–15.5)
WBC: 7.1 10*3/uL (ref 4.0–10.5)
WBC: 8.1 10*3/uL (ref 4.0–10.5)

## 2010-05-06 LAB — DIFFERENTIAL
Basophils Absolute: 0 10*3/uL (ref 0.0–0.1)
Basophils Relative: 1 % (ref 0–1)
Eosinophils Absolute: 0.1 10*3/uL (ref 0.0–0.7)
Monocytes Absolute: 0.8 10*3/uL (ref 0.1–1.0)
Monocytes Relative: 11 % (ref 3–12)
Neutro Abs: 5.3 10*3/uL (ref 1.7–7.7)

## 2010-05-06 LAB — APTT: aPTT: 36 seconds (ref 24–37)

## 2010-05-06 LAB — TROPONIN I: Troponin I: 0.07 ng/mL — ABNORMAL HIGH (ref 0.00–0.06)

## 2010-05-06 LAB — HEMOGLOBIN A1C: Mean Plasma Glucose: 128 mg/dL

## 2010-05-06 LAB — URINALYSIS, ROUTINE W REFLEX MICROSCOPIC
Bilirubin Urine: NEGATIVE
Glucose, UA: NEGATIVE mg/dL
Hgb urine dipstick: NEGATIVE
Ketones, ur: NEGATIVE mg/dL
Protein, ur: NEGATIVE mg/dL
Urobilinogen, UA: 1 mg/dL (ref 0.0–1.0)

## 2010-05-06 LAB — POCT CARDIAC MARKERS
CKMB, poc: <1 ng/mL — ABNORMAL LOW (ref 1.0–8.0)
CKMB, poc: <1 ng/mL — ABNORMAL LOW (ref 1.0–8.0)
Myoglobin, poc: 127 ng/mL (ref 12–200)

## 2010-05-06 LAB — PROTIME-INR
INR: 2.22 — ABNORMAL HIGH (ref 0.00–1.49)
INR: 2.25 — ABNORMAL HIGH (ref 0.00–1.49)
Prothrombin Time: 24.7 seconds — ABNORMAL HIGH (ref 11.6–15.2)

## 2010-05-06 LAB — LIPID PANEL
Cholesterol: 101 mg/dL (ref 0–200)
HDL: 27 mg/dL — ABNORMAL LOW (ref >39)
LDL Cholesterol: 48 mg/dL (ref 0–99)

## 2010-05-06 LAB — BRAIN NATRIURETIC PEPTIDE
Pro B Natriuretic peptide (BNP): 724 pg/mL — ABNORMAL HIGH (ref 0.0–100.0)
Pro B Natriuretic peptide (BNP): 776 pg/mL — ABNORMAL HIGH (ref 0.0–100.0)

## 2010-05-06 LAB — MAGNESIUM: Magnesium: 2.7 mg/dL — ABNORMAL HIGH (ref 1.5–2.5)

## 2010-05-07 ENCOUNTER — Encounter: Payer: Self-pay | Admitting: Family Medicine

## 2010-05-09 ENCOUNTER — Ambulatory Visit (INDEPENDENT_AMBULATORY_CARE_PROVIDER_SITE_OTHER): Payer: Medicare Other | Admitting: *Deleted

## 2010-05-09 DIAGNOSIS — Z95810 Presence of automatic (implantable) cardiac defibrillator: Secondary | ICD-10-CM

## 2010-05-09 DIAGNOSIS — R0989 Other specified symptoms and signs involving the circulatory and respiratory systems: Secondary | ICD-10-CM

## 2010-05-09 DIAGNOSIS — I459 Conduction disorder, unspecified: Secondary | ICD-10-CM

## 2010-05-09 DIAGNOSIS — I472 Ventricular tachycardia: Secondary | ICD-10-CM

## 2010-05-10 ENCOUNTER — Other Ambulatory Visit: Payer: Self-pay

## 2010-05-10 LAB — CBC
HCT: 45.6 % (ref 39.0–52.0)
HCT: 46.4 % (ref 39.0–52.0)
HCT: 47.6 % (ref 39.0–52.0)
HCT: 48.7 % (ref 39.0–52.0)
Hemoglobin: 16 g/dL (ref 13.0–17.0)
Hemoglobin: 16 g/dL (ref 13.0–17.0)
Hemoglobin: 16 g/dL (ref 13.0–17.0)
Hemoglobin: 16.3 g/dL (ref 13.0–17.0)
MCHC: 33.4 g/dL (ref 30.0–36.0)
MCHC: 33.5 g/dL (ref 30.0–36.0)
MCHC: 33.7 g/dL (ref 30.0–36.0)
MCHC: 33.7 g/dL (ref 30.0–36.0)
MCHC: 34 g/dL (ref 30.0–36.0)
MCHC: 34 g/dL (ref 30.0–36.0)
MCV: 87.7 fL (ref 78.0–100.0)
MCV: 88 fL (ref 78.0–100.0)
MCV: 88.2 fL (ref 78.0–100.0)
MCV: 88.5 fL (ref 78.0–100.0)
MCV: 88.6 fL (ref 78.0–100.0)
MCV: 88.9 fL (ref 78.0–100.0)
Platelets: 113 10*3/uL — ABNORMAL LOW (ref 150–400)
Platelets: 115 10*3/uL — ABNORMAL LOW (ref 150–400)
Platelets: 116 10*3/uL — ABNORMAL LOW (ref 150–400)
Platelets: 117 10*3/uL — ABNORMAL LOW (ref 150–400)
Platelets: 124 10*3/uL — ABNORMAL LOW (ref 150–400)
Platelets: 127 10*3/uL — ABNORMAL LOW (ref 150–400)
RBC: 5.41 MIL/uL (ref 4.22–5.81)
RBC: 5.42 MIL/uL (ref 4.22–5.81)
RBC: 5.5 MIL/uL (ref 4.22–5.81)
RDW: 15.6 % — ABNORMAL HIGH (ref 11.5–15.5)
RDW: 15.7 % — ABNORMAL HIGH (ref 11.5–15.5)
RDW: 15.7 % — ABNORMAL HIGH (ref 11.5–15.5)
RDW: 15.7 % — ABNORMAL HIGH (ref 11.5–15.5)
RDW: 16 % — ABNORMAL HIGH (ref 11.5–15.5)
RDW: 16 % — ABNORMAL HIGH (ref 11.5–15.5)
WBC: 6.7 10*3/uL (ref 4.0–10.5)
WBC: 6.7 10*3/uL (ref 4.0–10.5)
WBC: 7.1 10*3/uL (ref 4.0–10.5)
WBC: 7.2 10*3/uL (ref 4.0–10.5)
WBC: 7.6 10*3/uL (ref 4.0–10.5)
WBC: 7.9 10*3/uL (ref 4.0–10.5)

## 2010-05-10 LAB — COMPREHENSIVE METABOLIC PANEL
AST: 28 U/L (ref 0–37)
BUN: 20 mg/dL (ref 6–23)
CO2: 29 mEq/L (ref 19–32)
Chloride: 103 mEq/L (ref 96–112)
Creatinine, Ser: 1.77 mg/dL — ABNORMAL HIGH (ref 0.4–1.5)
GFR calc non Af Amer: 38 mL/min — ABNORMAL LOW (ref >60)
Glucose, Bld: 115 mg/dL — ABNORMAL HIGH (ref 70–99)
Total Bilirubin: 1.3 mg/dL — ABNORMAL HIGH (ref 0.3–1.2)

## 2010-05-10 LAB — BASIC METABOLIC PANEL
BUN: 16 mg/dL (ref 6–23)
BUN: 21 mg/dL (ref 6–23)
BUN: 21 mg/dL (ref 6–23)
BUN: 21 mg/dL (ref 6–23)
CO2: 29 mEq/L (ref 19–32)
CO2: 29 mEq/L (ref 19–32)
CO2: 29 mEq/L (ref 19–32)
Calcium: 10 mg/dL (ref 8.4–10.5)
Calcium: 9.3 mg/dL (ref 8.4–10.5)
Chloride: 100 mEq/L (ref 96–112)
Chloride: 102 mEq/L (ref 96–112)
Chloride: 103 mEq/L (ref 96–112)
Creatinine, Ser: 1.56 mg/dL — ABNORMAL HIGH (ref 0.4–1.5)
Creatinine, Ser: 1.58 mg/dL — ABNORMAL HIGH (ref 0.4–1.5)
Creatinine, Ser: 1.63 mg/dL — ABNORMAL HIGH (ref 0.4–1.5)
Creatinine, Ser: 1.67 mg/dL — ABNORMAL HIGH (ref 0.4–1.5)
GFR calc Af Amer: 48 mL/min — ABNORMAL LOW (ref >60)
GFR calc Af Amer: 49 mL/min — ABNORMAL LOW (ref >60)
GFR calc Af Amer: 53 mL/min — ABNORMAL LOW (ref >60)
GFR calc non Af Amer: 40 mL/min — ABNORMAL LOW (ref >60)
GFR calc non Af Amer: 41 mL/min — ABNORMAL LOW (ref >60)
GFR calc non Af Amer: 43 mL/min — ABNORMAL LOW (ref >60)
Glucose, Bld: 107 mg/dL — ABNORMAL HIGH (ref 70–99)
Glucose, Bld: 109 mg/dL — ABNORMAL HIGH (ref 70–99)
Glucose, Bld: 116 mg/dL — ABNORMAL HIGH (ref 70–99)
Potassium: 3.7 mEq/L (ref 3.5–5.1)
Sodium: 139 mEq/L (ref 135–145)

## 2010-05-10 LAB — PLATELET COUNT: Platelets: 118 10*3/uL — ABNORMAL LOW (ref 150–400)

## 2010-05-10 LAB — POCT CARDIAC MARKERS: Troponin i, poc: <0.05 ng/mL (ref 0.00–0.09)

## 2010-05-10 LAB — CK TOTAL AND CKMB (NOT AT ARMC): Total CK: 92 U/L (ref 7–232)

## 2010-05-10 LAB — PROTIME-INR
INR: 1.2 (ref 0.00–1.49)
INR: 1.3 (ref 0.00–1.49)
INR: 1.9 — ABNORMAL HIGH (ref 0.00–1.49)
INR: 2 — ABNORMAL HIGH (ref 0.00–1.49)
Prothrombin Time: 15.1 seconds (ref 11.6–15.2)
Prothrombin Time: 15.9 seconds — ABNORMAL HIGH (ref 11.6–15.2)
Prothrombin Time: 21.4 seconds — ABNORMAL HIGH (ref 11.6–15.2)
Prothrombin Time: 22.2 seconds — ABNORMAL HIGH (ref 11.6–15.2)

## 2010-05-10 LAB — CARDIAC PANEL(CRET KIN+CKTOT+MB+TROPI)
CK, MB: 1.2 ng/mL (ref 0.3–4.0)
Relative Index: INVALID (ref 0.0–2.5)
Relative Index: INVALID (ref 0.0–2.5)
Total CK: 88 U/L (ref 7–232)
Troponin I: 0.04 ng/mL (ref 0.00–0.06)
Troponin I: 0.04 ng/mL (ref 0.00–0.06)
Troponin I: 0.04 ng/mL (ref 0.00–0.06)

## 2010-05-10 LAB — APTT: aPTT: 33 seconds (ref 24–37)

## 2010-05-10 LAB — DIFFERENTIAL
Basophils Absolute: 0 10*3/uL (ref 0.0–0.1)
Eosinophils Relative: 2 % (ref 0–5)
Lymphocytes Relative: 22 % (ref 12–46)
Neutro Abs: 4.6 10*3/uL (ref 1.7–7.7)
Neutrophils Relative %: 64 % (ref 43–77)

## 2010-05-10 LAB — HEPARIN LEVEL (UNFRACTIONATED)
Heparin Unfractionated: 0.17 IU/mL — ABNORMAL LOW (ref 0.30–0.70)
Heparin Unfractionated: 0.26 IU/mL — ABNORMAL LOW (ref 0.30–0.70)
Heparin Unfractionated: 0.43 IU/mL (ref 0.30–0.70)
Heparin Unfractionated: 0.48 IU/mL (ref 0.30–0.70)
Heparin Unfractionated: 0.55 IU/mL (ref 0.30–0.70)
Heparin Unfractionated: <0.1 IU/mL — ABNORMAL LOW (ref 0.30–0.70)

## 2010-05-10 LAB — TROPONIN I: Troponin I: 0.05 ng/mL (ref 0.00–0.06)

## 2010-05-11 ENCOUNTER — Other Ambulatory Visit: Payer: Self-pay | Admitting: Family Medicine

## 2010-05-11 ENCOUNTER — Other Ambulatory Visit: Payer: Self-pay | Admitting: Cardiology

## 2010-05-11 LAB — DIFFERENTIAL
Basophils Absolute: 0 10*3/uL (ref 0.0–0.1)
Eosinophils Relative: 2 % (ref 0–5)
Lymphocytes Relative: 16 % (ref 12–46)
Neutro Abs: 6.1 10*3/uL (ref 1.7–7.7)
Neutrophils Relative %: 74 % (ref 43–77)

## 2010-05-11 LAB — COMPREHENSIVE METABOLIC PANEL
BUN: 23 mg/dL (ref 6–23)
CO2: 27 mEq/L (ref 19–32)
Chloride: 100 mEq/L (ref 96–112)
Creatinine, Ser: 1.52 mg/dL — ABNORMAL HIGH (ref 0.4–1.5)
GFR calc non Af Amer: 45 mL/min — ABNORMAL LOW (ref >60)
Glucose, Bld: 116 mg/dL — ABNORMAL HIGH (ref 70–99)
Total Bilirubin: 2.1 mg/dL — ABNORMAL HIGH (ref 0.3–1.2)

## 2010-05-11 LAB — CBC
HCT: 49.4 % (ref 39.0–52.0)
MCV: 87.4 fL (ref 78.0–100.0)
RBC: 5.65 MIL/uL (ref 4.22–5.81)
WBC: 8.3 10*3/uL (ref 4.0–10.5)

## 2010-05-11 LAB — POCT CARDIAC MARKERS: Myoglobin, poc: 110 ng/mL (ref 12–200)

## 2010-05-11 LAB — PROTIME-INR: Prothrombin Time: 19.5 seconds — ABNORMAL HIGH (ref 11.6–15.2)

## 2010-05-14 NOTE — Progress Notes (Signed)
icd remote check  

## 2010-05-16 LAB — BASIC METABOLIC PANEL
CO2: 26 mEq/L (ref 19–32)
Calcium: 9.3 mg/dL (ref 8.4–10.5)
Creatinine, Ser: 1.58 mg/dL — ABNORMAL HIGH (ref 0.4–1.5)
GFR calc Af Amer: 52 mL/min — ABNORMAL LOW (ref >60)

## 2010-05-16 LAB — POCT CARDIAC MARKERS
CKMB, poc: <1 ng/mL — ABNORMAL LOW (ref 1.0–8.0)
CKMB, poc: <1 ng/mL — ABNORMAL LOW (ref 1.0–8.0)
Myoglobin, poc: 101 ng/mL (ref 12–200)
Troponin i, poc: <0.05 ng/mL (ref 0.00–0.09)

## 2010-05-16 LAB — PROTIME-INR
INR: 1.9 — ABNORMAL HIGH (ref 0.00–1.49)
Prothrombin Time: 23.2 seconds — ABNORMAL HIGH (ref 11.6–15.2)

## 2010-05-16 LAB — DIFFERENTIAL
Basophils Relative: 1 % (ref 0–1)
Monocytes Relative: 10 % (ref 3–12)
Neutro Abs: 4.5 10*3/uL (ref 1.7–7.7)
Neutrophils Relative %: 61 % (ref 43–77)

## 2010-05-16 LAB — URINALYSIS, ROUTINE W REFLEX MICROSCOPIC
Glucose, UA: NEGATIVE mg/dL
Hgb urine dipstick: NEGATIVE
Protein, ur: 30 mg/dL — AB

## 2010-05-16 LAB — CBC
MCHC: 34.5 g/dL (ref 30.0–36.0)
RBC: 5.4 MIL/uL (ref 4.22–5.81)
WBC: 7.3 10*3/uL (ref 4.0–10.5)

## 2010-05-16 LAB — URINE MICROSCOPIC-ADD ON

## 2010-05-16 LAB — BRAIN NATRIURETIC PEPTIDE: Pro B Natriuretic peptide (BNP): 453 pg/mL — ABNORMAL HIGH (ref 0.0–100.0)

## 2010-05-16 LAB — CK TOTAL AND CKMB (NOT AT ARMC): Relative Index: 1.4 (ref 0.0–2.5)

## 2010-05-16 LAB — APTT: aPTT: 36 seconds (ref 24–37)

## 2010-05-24 ENCOUNTER — Ambulatory Visit (INDEPENDENT_AMBULATORY_CARE_PROVIDER_SITE_OTHER): Payer: Medicare Other | Admitting: *Deleted

## 2010-05-24 DIAGNOSIS — I4891 Unspecified atrial fibrillation: Secondary | ICD-10-CM

## 2010-05-24 DIAGNOSIS — I059 Rheumatic mitral valve disease, unspecified: Secondary | ICD-10-CM

## 2010-05-26 ENCOUNTER — Encounter: Payer: Self-pay | Admitting: *Deleted

## 2010-05-28 ENCOUNTER — Other Ambulatory Visit: Payer: Self-pay | Admitting: Family Medicine

## 2010-06-02 ENCOUNTER — Other Ambulatory Visit: Payer: Self-pay | Admitting: Cardiology

## 2010-06-07 ENCOUNTER — Ambulatory Visit (INDEPENDENT_AMBULATORY_CARE_PROVIDER_SITE_OTHER): Payer: Medicare Other | Admitting: *Deleted

## 2010-06-07 DIAGNOSIS — I4891 Unspecified atrial fibrillation: Secondary | ICD-10-CM

## 2010-06-07 DIAGNOSIS — I059 Rheumatic mitral valve disease, unspecified: Secondary | ICD-10-CM

## 2010-06-10 ENCOUNTER — Other Ambulatory Visit: Payer: Self-pay | Admitting: Family Medicine

## 2010-06-14 ENCOUNTER — Other Ambulatory Visit: Payer: Self-pay | Admitting: Family Medicine

## 2010-06-18 NOTE — Assessment & Plan Note (Signed)
[facility]                            CARDIOLOGY OFFICE NOTE   Mandeep, Kiser BINYOMIN BRANN                     MRN:          [id number]  DATE:04/19/2007                            DOB:          08/31/1931    Sean [name] is in for followup.  He is doing reasonably well.  He has not  had progressive shortness of breath or any ongoing chest pain.  He  remains on his Coumadin and seems to be tolerating this reasonably well  with close followup in the Coumadin Clinic.  He has also not had any  further problems with this implant.  He does remain somewhat fatigued  and his overall exercise endurance is not really all that good.  He has  also had followup and Dr. Retta Diones has recommended a followup CT  in  regards to the fact that he could not have an MRI for the previous  findings.   MEDICATIONS:  His medications include Coumadin as directed, aspirin 81  mg daily, metoprolol succinate ER 25 mg daily, isosorbide mononitrate 60  mg 1-1/2 tablets daily, omeprazole 20 mg daily, potassium chloride 10  mEq b.i.d., hydrochlorothiazide 25 mg daily, Benefiber 860 mg daily,  Simvastatin 40 mg nightly, Flomax 0.4 mg daily, and stool softeners  b.i.d.   PHYSICAL EXAMINATION:  On examination he is alert and oriented.  Weight  217 pounds.  Blood pressure 110/60, and the pulse is 68 and regular.  LUNGS:  The lung fields currently are clear to auscultation and  percussion.  CARDIAC:  The cardiac rhythm is without specific findings.   IMPRESSION:  1. Coronary artery disease with prior coronary artery bypass graft      surgery x2.  2. Mixed hyperlipidemia on multi-drug therapy.  3. Hypertension.  4. Ischemic cardiomyopathy.  5. History of inducible monomorphic ventricular tachycardia status      post implantation of an implantable defibrillator with need for      reimplant.  6. Mild chronic kidney disease, followed by Dr. Caryn Section.  7. History of pulmonary embolus, treated with  Coumadin      anticoagulation.  8. History of mitral regurgitation.   PLAN:  1. Continue current medical regimen.  2. Monitor for symptoms of myopathy.  3. Continue close followup in cardiology clinic.     [name]. [name], MD, [facility]  Electronically Signed    TDS/MedQ  DD: 04/20/2007  DT: 04/20/2007  Job #: [id number]

## 2010-06-18 NOTE — Assessment & Plan Note (Signed)
[name]                            CARDIOLOGY OFFICE NOTE   Sean [name], Sean [name] Sean [name]                     MRN:          [id number]  DATE:02/22/2008                            DOB:          06-Feb-1931    Sean [name] is in for followup.  From a cardiac standpoint, he is really  doing pretty well.  He does have some fatigue and a little bit of  shortness of breath, but this has not progressed.  He has gained little  bit of weight over the holidays and he thinks that this may be partially  responsible.  He has had some recent laboratory studies done.  His BUN  and creatinine most recently, as of February 08, 2008, was 22 and 1.57,  previously it was 1.71.  His lipids revealed triglycerides 155, HDL of  33, and LDL of 75.   MEDICATIONS:  1. Coumadin as directed.  2. Aspirin 81 mg daily.  3. Omeprazole 20 mg daily.  4. Potassium chloride 10 mEq b.i.d.  5. Hydrochlorothiazide 25 mg daily.  6. Fenofibrate 160 mg daily.  7. Simvastatin 40 mg nightly.  8. Flomax 0.4 mg daily.  9. Stool softeners b.i.d.  10.Imdur mononitrate 60 mg daily.   PHYSICAL EXAMINATION:  GENERAL:  Alert and oriented in no distress.  VITAL SIGNS:  Blood pressure is 120/70 and pulse 80.  LUNG:  Fields are clear.  CARDIAC:  There is an apical systolic murmur that is holosystolic murmur  compatible with his known mitral regurgitation.  EXTREMITIES:  There is no extremity edema.   EKG today reveals sinus rhythm with occasional premature ventricular  contractions.  They does appear to be a Wenckebach phenomenon with  prolonging PR interval.  However, there are not significant drop beats.   IMPRESSION:  1. Ischemic cardiomyopathy with an ejection fraction 35%, status post      implantation of single lead defibrillator.  2. Coronary artery disease, status post percutaneous stenting of the      saphenous vein graft earlier this year.  3. Chronic kidney disease.  4. History of pulmonary  embolus.  5. Conduction system disease with atrioventricular prolongation is      noted above.   PLAN:  1. Return to clinic in 2 months.  2. Continue current medical regimen.  3. Follow up in EP next month.   ADDENDUM:  He may eventually require upgrade to the AV defibrillator.     Sean [name]. [name], MD, [facility]  Electronically Signed    TDS/MedQ  DD: 02/22/2008  DT: 02/22/2008  Job #: [id number]   cc:   Duke Salvia, MD, [facility]

## 2010-06-18 NOTE — Assessment & Plan Note (Signed)
[facility]                            CARDIOLOGY OFFICE NOTE   NAME:Sean [name]                     MRN:          [id number]  DATE:11/23/2006                            DOB:          17-Dec-1931    Sean [name] is in for a followup visit. To briefly summarize, he has been  stable. He has been working at the [employer]. He denies any chest  pain. He had his pacemaker checked about a month ago.   PHYSICAL EXAMINATION:  Blood pressure 120/64, pulse 61.  The lung fields are clear.  Cardiac rhythm is regular.  He has no extremity edema.   Electrocardiogram demonstrates normal sinus rhythm with a first-degree  AV block. There is a rare premature ventricular contraction.   IMPRESSION:  1. Coronary artery disease with prior coronary artery bypass graft      surgery.  2. History of hypertension.  3. Mild chronic kidney disease.  4. Mixed hyperlipidemia.  5. Pulmonary embolism, treated with Coumadin anticoagulation.  6. History of mitral regurgitation.  7. History of prior implantation of implantable cardio defibrillator.   PLAN:  1. Continue current medical regimen except we will substitute      metoprolol XL for atenolol XL given his underlying chronic renal      disease.  2. We will not increase the dose of metoprolol specifically with his      first-degree AV block, but we will try to continue on beta-blocker      for obvious reasons.  3. Basic metabolic profile.  4. Continued followup with Dr.  Evelena [name].  5. Recommend followup of lipid and liver with Dr.  Caryl [name].  6. Return to clinic in three months.     Sean [name]. Sean Kill, MD, [facility]  Electronically Signed    TDS/MedQ  DD: 11/23/2006  DT: 11/23/2006  Job #: [id number]

## 2010-06-18 NOTE — Assessment & Plan Note (Signed)
[facility]                            CARDIOLOGY OFFICE NOTE   Masaji, Billups ZAMIER EGGEBRECHT                     MRN:          [id number]  DATE:04/24/2008                            DOB:          October 15, 1931    [name] is in for followup.  He recently saw Dr. Caryn Section and his  diuretics were increased.  Prior to that, he was seen in the emergency  room and that suggested some pulmonary edema.  He has had an  echocardiogram done back in the fall, which did demonstrate some mild  mitral regurgitation, and he clearly has a murmur.  His chest x-ray in  the emergency room did so show some pulmonary edema.  He has been  followed by [facility] and his weight has been checked on a  daily basis.  It has come down, and continued to drop as the  hydrochlorothiazide was increased.  He now feels somewhat better.  He  does not remember abusing salt, although it is not clear that he has  been at careful.  Dr. Graciela Husbands did suggest the possibility of doing a CPX  test, with the question of whether he should be upgraded today, but  reluctant to do this given the problems that he had previously.  Since  raising hydrochlorothiazide to 1 tablet daily and then additional tablet  3 days a week.  He has been somewhat better and his weight has been  coming down.   CURRENT MEDICATIONS:  1. Coumadin as directed.  2. Aspirin 81 mg daily.  3. Omeprazole 20 mg daily.  4. Potassium chloride 10 b.i.d.  5. Fenofibrate 160 mg daily.  6. Simvastatin 40 q.h.s.  7. Flomax 0.4 daily.  8. Stool softeners b.i.d.  9. Imdur.  10.Isosorbide mononitrate 60 mg one daily.  11.Hydrochlorothiazide 1 tablet daily and then 2 tablets on Tuesday,      Wednesday, and Thursday and possibly Friday.   On physical, he is alert and oriented.  The weight is 211 pounds down  from 217.  The blood pressure is 130/68, pulse is 80.  The lung fields  actually are quite clear.  Today, there is an apical systolic  murmur  compatible with mitral regurgitation.   His recent electrocardiogram demonstrates sinus rhythm with marked first-  degree AV block.  His AV interval has been lengthened.   Overall, the patient is stable.  He might potentially benefit from  implantation of pacemaker in addition to a single lead defibrillator.  That would require AV pacing.  With his weight coming down, and his  symptoms have improving.  We will continue to follow him closely.  When  he returns in 4 weeks, we will get a chest x-ray.  We will also check  his basic metabolic profile today to make sure his potassium was holding  in light of the increased diuretics.  Should any  problems in the interim, he is to call us promptly.  If his weight  starts to go up, he knows to contact us as well.  We will hold off on  CPX testing at  the present time.     [name]. [name], MD, [facility]  Electronically Signed    TDS/MedQ  DD: 04/24/2008  DT: 04/25/2008  Job #: [id number]

## 2010-06-18 NOTE — Assessment & Plan Note (Signed)
[name]                            CARDIOLOGY OFFICE NOTE   Sean [name], Sean [name]                     MRN:          [id number]  DATE:12/20/2007                            DOB:          22-Aug-1931    [name] is in for followup.  From a clinical standpoint, he really is  doing quite well.  He denies any ongoing chest pain.  We have not  restarted any beta-blockers on him largely because of prolonged first-  degree AV block.  He is scheduled to see Dr. Caryn [name].  However, his blood  pressure has been under reasonable control.  We had him interrogated  today, and he has had absolutely no VT event since he was discharged  from the hospital.  He had a VT event prior to the hospitalization and  has had virtually none since and would like to return to work.   CURRENT MEDICATIONS:  1. Coumadin as directed.  2. Aspirin 81 mg daily.  3. Omeprazole 20 mg daily.  4. Potassium chloride 10 mEq b.i.d.  5. Hydrochlorothiazide 25 mg daily.  6. Fenofibrate 160 mg daily.  7. Simvastatin 40 mg nightly.  8. Flomax 0.4 mg daily.  9. Stool softeners b.i.d.  10.Imdur mononitrate 60 mg daily.   PHYSICAL EXAMINATION:  The blood pressure is 130/70, the pulse is 76.  The lung fields are absolutely clear.  The cardiac rhythm is regular.  He really has not had much in the way of extremity edema.   His last electrocardiogram demonstrated marked first-degree AV block  with left ventricular hypertrophy and repolarization abnormalities.   Last laboratory studies included a hemoglobin of 15.3, BUN 18, and  creatinine 1.6.   The patient continues to get along reasonably well.  He has had no  further problems, whatsoever.  We plan to see him back in followup in  about 2 months.  We will reassess his status at that time.     Sean [name]. [name], MD, [facility]  Electronically Signed    TDS/MedQ  DD: 12/20/2007  DT: 12/21/2007  Job #: [id number]

## 2010-06-18 NOTE — Assessment & Plan Note (Signed)
 HEALTHCARE                         ELECTROPHYSIOLOGY OFFICE NOTE   Evangelos, Paulino MELODY SAVIDGE                     MRN:          [id number]  DATE:03/08/2007                            DOB:          [birth year]    Mr. Hellinger is seen for ventricular tachycardia and ischemic heart  disease with prior ICD implantation.  This was complicated by infection  at the time of generator change out that require explantation and  revision on the other side.  Since that time he has had no problems with  that.   He saw Dr. [name] last week who thought that he was doing really very  well.   At this time his medications are unchanged since that visit and they are  not recounted here.   On examination his blood pressure is 125/60 with a pulse was 59.  His  lungs were clear.  Heart sounds were regular.  Extremities were without  edema.   Interrogation of his Guidant Vitality ICD demonstrates an R-wave of 25  with impedance of 583, a threshold 0.6 at 0.4, battery voltage 3.2.  There are no intercurrent episodes.   IMPRESSION:  1. Ventricular tachycardia  2. Status post ICD for the above, with generator explantation because      of infection and reimplantation  3. Ischemic heart disease.      a.     Prior myocardial infarction      b.     Prior bypass      c.     Modest depression of LV function  4. Pulmonary emboli on chronic Coumadin.  5. Mild renal insufficiency which is stabilized.   Mr. Farrelly  is doing well.  We will plan to see him again in one year's  time. He will be followed remotely in the interim.     Duke Salvia, MD, [facility]  Electronically Signed    SCK/MedQ  DD: 03/08/2007  DT: 03/08/2007  Job #: [id number]

## 2010-06-18 NOTE — H&P (Signed)
NAMEGIANNO, VOLNER NO.:  [id number]   MEDICAL RECORD NO.:  [id number]          PATIENT TYPE:  INP   LOCATION:  2003                         FACILITY:  [facility]   PHYSICIAN:  Marca Ancona, MD      DATE OF BIRTH:  1931-02-27   DATE OF ADMISSION:  10/11/2007  DATE OF DISCHARGE:                              HISTORY & PHYSICAL   PRIMARY CARE PHYSICIAN:  Evelena Peat, MD   PRIMARY CARDIOLOGIST:  [name]. [name], MD, [facility]   CHIEF COMPLAINT:  Chest pain.   HISTORY OF PRESENT ILLNESS:  Sean [name] is a 77 year old male with known  coronary artery disease.  Two days ago he was walking his usual mile  when, or going up a hill, he had onset of substernal chest pain, he  describes as both sharp and pressure.  He stated it radiated to both  arms.  It was associated with shortness of breath.  When he got to the  top of the hill, he stopped and rested and his symptoms eased.  He  states the chest pain reached to 3 or 4/10.  He had some slight nausea  with this, but no vomiting or diaphoresis.  He states that it was his  usual angina.  His symptoms resolved without medications, so he did not  take nitroglycerin.  Since then, he has had multiple episodes of chest  pains.  They have all been exertional, but the amount of exertional  required to induce substantially less than usual.  He gets chest pain  walking room to room.  He does state that his chest pain can be worse  with deep inspiration, but he states that when he is having the chest  pain with exertion, he will feel like a large weight is on his chest.  He has also noted increased dyspnea on exertion recently.  He feels a  fullness in his abdomen and he has not felt hungry for the last couple  of days.  He does not track his weight.  He has noted some slight lower  extremity edema as well.  His symptoms concern to him and he called 911  and came to the emergency room today.  Currently, he is without chest  pain on  O2.   PAST MEDICAL HISTORY:  1. Status post aortocoronary bypass surgery in 1991 with a redo in      1999.  2. Status post cardiac catheterization in December 2006 with LAD,      circumflex and RCA occluded, patent LIMA to LAD and diagonal,      patent SVG to OM 2, all other grafts occluded.  3. Status post echocardiogram in October 2007, showing an EF of 40%      with mild MR.  4. Ischemic cardiomyopathy.  5. Hypertension.  6. Hyperlipidemia.  7. Family history of coronary artery disease.  8. History of DVT in his right lower extremity and PE in 2007, on      Coumadin since then.  9. History of sustained monomorphic VT, status post Guidant prism,      removed secondary  to dehiscence and now with a Vitality.  10.History of MI x2.  11.History of gastroesophageal reflux disease and peptic ulcer      disease.   SURGICAL HISTORY:  He is status post cardiac catheterizations as well as  bypass x2, ICD x2, cholecystectomy, appendectomy, and right thoracotomy  or lung mass that turned out to be scar tissue.   ALLERGIES:  He is allergic to PENICILLIN, CLOTH TAPE.   CURRENT MEDICATIONS:  1. Aspirin 81 mg daily.  2. Imdur 60 mg 1-1/2 tabs daily.  3. K-Dur 20 mEq 2 tablets daily.  4. Simvastatin 40 mg a day.  5. TriCor 160 mg a day.  6. Flomax 0.4 mg a day.  7. Omeprazole 20 mg a day.  8. Hydrochlorothiazide 25 mg a day.  9. Coumadin 5 mg, 1/2 tab daily except for 1 tablet on Monday,      Wednesday, and Friday.   SOCIAL HISTORY:  Lives in [place] with his wife and works 1-day a  week at [employer].  He quit tobacco more than 30 years ago  and denies alcohol or drug abuse.   FAMILY HISTORY:  His mother died at age 83 of coronary artery disease,  and he has got 5 siblings with a history of MI and heart disease.  His  father died at age 76, probably of sepsis.   REVIEW OF SYSTEMS:  Notable for poor appetite.  He has chronic  arthralgias.  He has had nausea with  chest pain.  He has some orthopnea,  but denies PND.  He gets occasional palpitations and has a known history  of PVCs.  He has not had fevers, chills, or other systemic illnesses.  Full 14-point review of systems is otherwise negative.   PHYSICAL EXAM:  VITAL SIGNS:  Temperature is 98.2, blood pressure  113/55, pulse 77, respiratory rate 20, O2 saturation 97% on 2 liters.  GENERAL:  He is a well-developed, elderly white male in no acute  distress.  HEENT:  Normal.  NECK:  There is no lymphadenopathy, thyromegaly, or bruit noted.  He has  JVP at 8 to 9 cm.  CV:  His heart is regular, and rate and rhythm with an S1, S2 and a 2/6  systolic murmur at the left sternal border.  Distal pulses are intact in  all four extremities and no femoral bruits are appreciated.  LUNGS:  He has a few rales at the bases, left greater than right.  SKIN:  No rashes or lesions are noted.  ABDOMEN:  Soft and nontender with active bowel sounds and no  hepatosplenomegaly by palpation.  EXTREMITIES:  He has 1+ edema on the right lower extremity and no edema  on the left.  MUSCULOSKELETAL:  There is no joint deformity or effusions and no spine  or CVA tenderness.  NEUROLOGIC:  He is alert and oriented.  Cranial nerves II through XII  grossly intact.   Chest x-ray showed cardiomegaly and no acute disease with chronically  increased lung markings.   EKG:  Sinus rhythm, rate 82 with a profound first-degree AV block and a  P-R of 0.4.  This is increased from an EKG dated October 2007 when his P-  R interval was 0.278.  He also has PVCs and PACs noted.   IMPRESSION:  Sean [name] was seen today by Dr. Shirlee Latch.  He is a 77-year-  old male with a history of bypass surgery x2 and ischemic cardiomyopathy  who presents with 3-4 days  of significantly increased dyspnea on  exertion and associated substernal chest tightness radiating to both  arms.  Initially, the dyspnea on exertion, the chest tightness or  heaviness was  noted while walking up steps or on his daily walk up a  hill.  Today, however, the dyspnea on exertion and chest tightness  occurred while walking around the house.  He has had no resting symptoms  and no prolonged symptoms as they resolved in less than 15 minutes with  rest.   PLAN:  1. Coronary artery disease:  The symptoms are consistent with unstable      angina.  His first set of cardiac markers are negative.  He is on      Coumadin for DVT, which is on hold.  We will start heparin when his      INR is less than 2.  He will get a left heart catheterization when      his INR is less than 1.8.  We will continue to cycle cardiac      enzymes.  If he needs Plavix for percutaneous intervention, this      should be okay.  Consideration can be given to discontinuing      Coumadin as he has been on it since October 2007, when he had right      lower extremity DVT and bilateral PE in the setting of 1 week of      being very sedentary.  We will increase his aspirin to 325 mg      daily.  He will be continued on a statin and we will check a lipid      profile.  We will add nitroglycerin paste and hold the Imdur.  2. Ischemic cardiomyopathy:  He appears mildly volume overloaded with      slightly elevated JVP and recent onset lower extremity edema.  He      is comfortable at rest.  We will check a BMP, which is pending.  He      can be diuresed as needed, but we will try to hold off on this      until after the cath given his chronic kidney disease.  He can be      initiated on ACE inhibitor after the cath.  We will check a 2-D      echocardiogram.  3. Arrhythmia:  He has frequent PVCs and a profound first-degree AV      block, which is increased from EKG of 2 years ago.  He is also      noted to have episodes of type 1 second-degree AV block on      telemetry.  We will therefore hold off on beta-blocker for now.  He      has an ICD in place, but would probably feel systemically worse if       we decreased his heart rate to the point that he was in a paced      rhythm.  4. Chronic kidney disease:  His GFR was 45 in July 2009.  Current labs      are pending.  He will be started empirically on Mucomyst to begin      tomorrow.  His BUN and creatinine will be followed closely before      and after the cath.      Theodore Demark, PA-C      Marca Ancona, MD  Electronically Signed    RB/MEDQ  D:  10/11/2007  T:  10/11/2007  Job:  [id number]

## 2010-06-18 NOTE — Cardiovascular Report (Signed)
Sean [name], Sean [name] NO.:  [id number]   MEDICAL RECORD NO.:  [id number]          PATIENT TYPE:  INP   LOCATION:  2114                         FACILITY:  [facility]   PHYSICIAN:  [name]. [name], MD, FACCDATE OF BIRTH:  1931-05-27   DATE OF PROCEDURE:  10/14/2007  DATE OF DISCHARGE:                            CARDIAC CATHETERIZATION   INDICATIONS:  Sean [name] is well known to me.  He is a 77 year old  gentleman who underwent primary PCI in 1991.  He subsequently underwent  revascularization surgery.  He had redo surgery in 1999.  At that time,  the surgery included a saphenous vein graft to the distal LAD, saphenous  vein graft to the OM-1, saphenous vein graft to the OM-2, and saphenous  vein graft to the PDA.  The patient had previously had a LIMA to the  diagonal and a sequential saphenous vein graft to the OM and circumflex.  His last catheterization demonstrated occlusion of the vein graft to the  distal LAD, OM-1, and PDA with continued patency of the saphenous vein  graft to the OM-2 and the internal mammary to the diagonal which filled  the LAD.  The patient has had defibrillator placed.  He also has mitral  regurgitation, and also LV dysfunction with history of ventricular  tachycardia.  The current study was done to assess coronary anatomy  after he represented with possibly mild volume overload, as well as  chest discomfort.  The patient also has elevated creatinine, and was  given gentle intravenous fluids.   PROCEDURES:  1. Left heart catheterization.  2. Selective coronary arteriography.  3. Saphenous vein graft angiography.  4. Selective left internal mammary angiography.   DESCRIPTION OF PROCEDURE:  The patient was brought to the cath lab and  prepped and draped in usual fashion.  Through an anterior puncture the  right femoral artery was easily entered and a 5-French sheath was  placed.  Because of the calcified iliacs as well as distal aorta,  wire  manipulation into the aorta was slightly difficult.  In addition, the  patient had some recurrent clotting in the sheath, and as a result of  that he was given 1000 units of intravenous heparin.  His heparin drip  had just been discontinued on call to the cath lab.  Following this, we  did views of the left coronary artery.  We attempted to use an AL-1 to  get the right, but we used a left bypass because it was difficult to  manipulate the 5-French catheters with the iliac disease.  In addition,  the right was known to be relatively occluded.  We were able to engage  the vein graft using a standard right catheter and internal mammary was  used for the internal mammary artery.  The vein grafts to the LAD, OM-1,  and PDA are known to be occluded, and because of the elevated creatinine  were then deferred.   He tolerated the procedure well and was taken to the holding area in  satisfactory clinical condition.   HEMODYNAMIC DATA:  1. Central aortic pressure was 113/66, mean 85.  2. Left ventricular pressure 128/49.  It was difficult to tell if      there was a gradient, but there did not appear to be a gradient on      pullback.  The patient appeared to have a fairly slow wide complex      tachycardia interceded on top of his sinus rhythm.   ANGIOGRAPHIC DATA:  1. Heavy calcification of the coronary arteries.  2. The right coronary artery is severely diseased proximally,      essentially totally occluded.  3. The left main is free of critical disease.  4. The LAD is occluded after a septal perforator that itself has 80%      narrowing.  5. The AV circumflex is occluded.  6. The saphenous vein graft to the LAD, OM-1, and PDA are known to be      occluded.  7. The internal mammary to the diagonal appears to be intact with its      insertion just about the takeoff of the LAD and diagonal.  There is      relatively good filling of the diagonal, all the diagonals likely       diseased distally.  8. The vein graft to the OM-2 is intact.  However, there is an 80%      somewhat hazy stenosis noted in the distal body of the graft.      There is room distally for distal protection.  The distal right      coronary territory appears to be collateralized.   CONCLUSION:  1. Continued patency of the internal mammary to the      diagonal/retrograde to the LAD.  2. Continued patency of the saphenous vein graft to the OM-2 which      supplies the large OM-2 area and the distal right coronary but with      a new high-grade saphenous vein graft stenosis.  3. Known occlusion of the saphenous vein grafts to the LAD, OM-1, and      PDA.  4. Total occlusion of the LAD, circumflex, and right coronary arteries      as noted above.  5. Chronic kidney disease.  6. Left ventricular dysfunction with prior implantable defibrillator.  7. Mitral regurgitation.   PLAN:  I plan to review with my colleagues.  Nonetheless, high risk  percutaneous intervention will likely be recommended.  The patient does  have a history of pulmonary emboli.  He will require Coumadin and  therefore a drug-eluting stent may not be an option.  Fortunately, the  vein graft is fairly large.      [name]. [name], MD, [facility]  Electronically Signed     TDS/MEDQ  D:  10/14/2007  T:  10/15/2007  Job:  [id number]

## 2010-06-18 NOTE — Assessment & Plan Note (Signed)
[facility] HEALTHCARE                            CARDIOLOGY OFFICE NOTE   Darrik, Richman MALEKAI MARKWOOD                     MRN:          [id number]  DATE:08/09/2007                            DOB:          07/21/1931    Shavar is in for followup.  He had some very minimal chest discomfort  yesterday and today, but overall was getting along reasonably well.  He  did notice that his INR was low, and he has had a little bit of cramp in  his left leg.  His previous DVT was in his right leg and the lowest his  INR has been at any one time has been 1.8.  He has also noted to have  today a prolongation of his PR interval, slightly longer than in the  past.  I have reviewed this in extensive detail today with Dr. Ladona Ridgel.  We interrogated his device.  He has had no new shocks.  His PR interval  has prolonged.  He is on metoprolol succinate, but the PR interval now  is around 424 msec, where it was in the 270 msec range on the previous  EKG in January 2009.  This study shows a rare premature ventricular  contraction.  Overall, he feels relatively well.   CURRENT MEDICATIONS:  1. Coumadin as directed with the current INR of 1.8.  2. He is taking aspirin 81 mg daily.  3. Metoprolol succinate ER 25 mg daily.  4. Isosorbide mononitrate 60 mg 1-1/2 tablet daily.  5. Omeprazole 20 mg daily.  6. Potassium chloride 10 mEq b.i.d.  7. Hydrochlorothiazide 25 mg daily.  8. Fenofibrate 160 mg daily.  9. Simvastatin 40 mg at bedtime.  10.Flomax 0.4 mg daily.  11.Stool softener b.i.d.   PHYSICAL EXAMINATION:  GENERAL: He is alert and oriented.  VITAL SIGNS:  The weight is 216 pounds.  Blood pressure 126/66.  The  pulse is 74.  LUNG:  Fields are entirely clear.  CARDIAC:  Rhythm reveals a regular rhythm with an apical holosystolic  murmur compatible with his known mitral regurgitation.  EXTREMITIES:  Do not reveal significant swelling in the left lower  extremity.  Although, he does  have some calf tenderness.   The electrocardiogram demonstrates normal sinus rhythm with a prolonged  marked first-degree AV block.  There is left ventricular hypertrophy  with repolarization abnormality.  When compared to the previous tracing,  there is not a significant interval change.  The T-waves are slightly  more biphasic.  The PR is prolonged from the previous tracing.   As noted, Dr. Ladona Ridgel and I reviewed his strips.  At the present time, we  will recommend watchful waiting.  I will have him taper his metoprolol  succinate to 12.5 mg for 3 days, then subsequently  discontinue them.  We will see him back in followup in 4 weeks.  We will  also get a Doppler of his left lower extremity to exclude a DVT given  his prior history of this in the past.     [name], MD, [facility]  Electronically Signed  TDS/MedQ  DD: 08/09/2007  DT: 08/09/2007  Job #: [id number]

## 2010-06-18 NOTE — Assessment & Plan Note (Signed)
[facility] HEALTHCARE                            CARDIOLOGY OFFICE NOTE   Sean [name], Sean [name]                     MRN:          [id number]  DATE:04/19/2007                            DOB:          11/20/1931    [name] is in for follow-up.  In general, he has been stable.  He  does have reduced overall exercise tolerance.  He has not had recurrent  chest pain.   CURRENT MEDICATIONS:  1. Coumadin as directed.  2. Aspirin 81 mg daily.  3. Metoprolol succinate 25 mg daily.  4. Isosorbide mononitrate 6 mg one and a half tablets daily.  5. Omeprazole 20 mg daily.  6. Potassium chloride 10 mEq b.i.d.  7. Hydrochlorothiazide 25 mg daily.  8. Fenofibrate 160 mg daily.  9. Simvastatin 140 mg daily.  10.Flomax 0.4 mg daily.  11.Stool softeners b.i.d.   PHYSICAL EXAMINATION:  VITAL SIGNS:  Blood pressure is 110/60, pulse of  68.  LUNGS:  The lung fields are clear.  CARDIAC:  There is a holosystolic murmur at the apex compatible with his  known mitral regurgitation.  EXTREMITIES:  The extremities reveal no significant edema.   IMPRESSION:  1. Ischemic cardiomyopathy status post implantable defibrillator      implantation.  2. Coronary artery disease with a history of moderate mitral      regurgitation.  3. History of small renal lesion being followed by urology.  4. Hypertension.  5. Prior coronary artery bypass graft surgery.  6. Hypercholesterolemia.   PLAN:  1. Return to clinic in four months.  2. Basic metabolic profile.  3. Continue current medical regimen.     [name]. [name], MD, [facility]  Electronically Signed    TDS/MedQ  DD: 04/19/2007  DT: 04/19/2007  Job #: [id number]

## 2010-06-18 NOTE — Cardiovascular Report (Signed)
Sean [name], Sean [name] NO.:  [id number]   MEDICAL RECORD NO.:  [id number]          PATIENT TYPE:  INP   LOCATION:  2621                         FACILITY:  [facility]   PHYSICIAN:  [name]. [name], MD, FACCDATE OF BIRTH:  January 08, 1932   DATE OF PROCEDURE:  DATE OF DISCHARGE:                            CARDIAC CATHETERIZATION   INDICATIONS:  [name] is a 77 year old well-known to me.  He has had  prior bypass surgery x2.  He has been admitted with chest pain.  Catheterization has revealed a high-grade somewhat thrombotic-appearing  stenosis in the mid vein graft to the circumflex.  His other vein grafts  were known to be occluded and his internal mammary is patent.  His  echocardiogram demonstrates EF of 30%-35% and he has defibrillator in.  He has also had ventricular tachycardia.  It has been a slow V-tach  mechanism, which is uncertain.  Dr. Graciela Husbands has been evaluating that.  He  is on a slow amiodarone drip.  The current study was done to perform  stenting of the saphenous vein graft.  The patient also has modest renal  insufficiency.   PROCEDURE:  The patient was brought to the catheterization laboratory  after informed consent.  He was prepped and draped in usual fashion.  Through an anterior puncture, the femoral artery was easily entered.  A  6-French sheath was then placed.  Bivalirudin was given according to  protocol and AL-1 guide with side holes was then taken to the vein  graft.  There was fairly nice seating.  ACT was checked and found to be  appropriate for PCI.  Following this, a wire was passed down into the  distal vein graft.  Following this, a 4-0 spider was taken down there  and placed nicely distally.  Following this, we used a 35 x 23 Vision  stent.  This was used in large part as the patient has had prior  pulmonary embolus, and requires persistent Coumadin anticoagulation.  The vein graft was then subsequently stented up to 16 atmospheres with  a  3.5-mm stent.  There was an excellent angiographic response.  Following  this, the device was retrieved without complication.  A followup  angiogram was performed.  We limited contrast to less than 50 mL of dye.  There were no major complications.   The saphenous vein graft has a focal 80%-90% stenosis with haziness  above the lesion.  Following stenting, this area was completely smoothed  out.  There were small amount of retrieval on the basket.  There was no  obvious clinical distal embolization.  Angiographic result was  excellent.  There were no distal complications from the retrieval  devices.   CONCLUSION:  Successful percutaneous stenting of the saphenous vein  graft to the OM-2 with distal protection.   DISPOSITION:  The patient will remain on amiodarone drip.  He will be  followed closely by Dr. Graciela Husbands.  Decisions will then be made.      [name]. [name], MD, [facility]  Electronically Signed    TDS/MEDQ  D:  10/18/2007  T:  10/19/2007  Job:  [id number]   cc:   Duke Salvia, MD, [facility]  [name]. [name], MD, [facility]

## 2010-06-18 NOTE — Assessment & Plan Note (Signed)
[facility] HEALTHCARE                            CARDIOLOGY OFFICE NOTE   Clem, Wisenbaker GINO GARRABRANT                     MRN:          [id number]  DATE:03/01/2007                            DOB:          1931/03/10    Treyce is in for follow-up.  In general, he is stable.  He denies any  ongoing chest pain or shortness of breath.  He has not been having any  kind of myopathic symptoms whatsoever.  He has seen nephrology and has  also been seen by urology.  There was a question of the left renal mass,  but he had a CT urogram.  This was largely unremarkable and a urine was  negative.  He has been followed by Dr. Retta Diones and also by Dr. Caryn Section.  Repeat BMET has been done by Dr. Caryn Section and received a letter from him  regarding this.  He is being followed by Dr. Evelena Peat.   His current medications include Coumadin as directed aspirin 81 mg  daily, isosorbide 60 mg 1-and-a-half tablets daily, Tricor 145 mg daily,  KCl 10 mEq 2 tablets daily, simvastatin 40 mg daily, hydrochlorothiazide  25 mg 1 daily, stool softener daily, Flomax 0.5 daily, fenofibrate 160  mg daily, metoprolol XL 25 mg daily, and omeprazole 20 mg daily.   PHYSICAL EXAMINATION:  Blood pressure is 120/64, pulse 72.  Lung fields clear.  The cardiac exam reveals a soft systolic ejection murmur without  significant rubs or gallops.  There is no diastolic murmur noted.  EXTREMITIES:  Reveal no definite edema.   Electrocardiogram demonstrates normal sinus rhythm.  There is a first  degree AV block with left axis deviation and nonspecific  interventricular conduction delay.  There are nonspecific T-wave  abnormalities.  Compared to previous tracings, there does not appear to  be a lot of interval change.  There is perhaps slightly more T-wave  inversion in I and AVL.   IMPRESSION:  1. Coronary disease with prior coronary bypass graft surgery x2.  2. Prior myocardial infarction.  3. Hypertension.  4.  Mild renal insufficiency followed by Dr. Caryn Section.  5. Mixed hyperlipidemia on 2-drug therapy.  6. History of pulmonary embolus treated with Coumadin anticoagulation.  7. History mitral regurgitation.  8. History of prior implantation of Guidant Vitality-II VR implantable      cardiac defibrillator with subsequent dehiscence.  9. History of monomorphic ventricular tachycardia stable.   PLAN:  1. Continue current medical regimen.  2. Return to clinic in 2 months  3. At that time we will make arrangements for an outpatient      echocardiogram.     [name]. [name], MD, [facility]  Electronically Signed    TDS/MedQ  DD: 03/01/2007  DT: 03/01/2007  Job #: [id number]   cc:   Wilber Bihari. Caryn Section, M.D.  Evelena Peat, M.D.

## 2010-06-18 NOTE — Discharge Summary (Signed)
Sean [name], Sean [name] NO.:  [id number]   MEDICAL RECORD NO.:  [id number]          PATIENT TYPE:  INP   LOCATION:  2013                         FACILITY:  [facility]   PHYSICIAN:  Sean Morton. [name], MD, FACCDATE OF BIRTH:  Aug 10, 1931   DATE OF ADMISSION:  10/11/2007  DATE OF DISCHARGE:  10/21/2007                               DISCHARGE SUMMARY   PRIMARY CARDIOLOGIST:  Sean Fus D. [name], MD, [facility]   PRIMARY CARE Sean [name]:  Sean [name], M.D.   He is also followed in [facility].   DISCHARGE DIAGNOSIS:  Unstable angina.   SECONDARY DIAGNOSES:  1. Coronary artery disease status post CABG in 1991 with redo CABG in      1999.  2. Ischemic cardiomyopathy with an EF of 40% October 2007.  3. Sustained ventricular tachycardia status post ICD.  4. Gastroesophageal reflux disease.  5. Peptic ulcer disease  __________ complicated by infection requiring removal and replacement  with a Guidant Vitality.  1. Hypertension.  2. Hyperlipidemia.  3. First degree atrioventricular block.  4. Class III Chronic kidney disease.  5. History of deep vein thrombosis and pulmonary embolus on chronic      Coumadin.  6. Chronic systolic congestive heart failure.  7. Status post cholecystectomy.  8. Status post appendectomy.  9. Status post right thoracotomy secondary to lung mass, which turned      out to be scar.  10.Acute drug rash possibly secondary to amiodarone therapy during      this admission.   ALLERGIES:  PENICILLIN AND CLOTH TAPE.   PROCEDURE:  Left heart cardiac catheterization revealing 2 of 5 patent  grafts with 80% stenosis in the vein graft to the obtuse marginal, which  was successfully stented with a 3.5 x 23 mm Vision bare metal stent.   HISTORY OF PRESENT ILLNESS:  A 77 year old Caucasian male with a prior  history of CD status post CABG and redo CABG who was in his usual state  of health until approximately 2 days prior to admission  when he began to  experience exertional substernal chest pressure with radiation to  bilateral arms associated with shortness of breath when walking up a  hill.  Symptoms resolved with rest, but unfortunately he experienced  several recurrent episodes all with exertion.  Because of ongoing  symptoms, he presented to the [facility] Emergency Department on  September 7 where point-of-care markers were negative and ECG showed no  acute changes with a wide PR interval of 360 milliseconds.  Patient was  admitted for further evaluation.   HOSPITAL COURSE:  Following admission, Sean [name] had no recurrent chest  discomfort.  His CKs and MBs were normal with an elevated troponin of  0.11 to 0.14.  As he is on chronic Coumadin and his INR was  elevated/therapeutic at 2.7, Coumadin was placed on hold and  catheterization was delayed until his INR was down.  We were finally  able to perform cardiac catheterization on September 10 revealing  significant multivessel disease with patent LIMA to the LAD and 80%  stenosis in the vein graft to  the second obtuse marginal.  Veins to the  LAD, first obtuse marginal and PDA were previously known to be occluded.  It is felt that the vein to the obtuse marginal was likely the culprit  of Sean [name] discomfort.  However, given his history of chronic  kidney disease, it was felt best to defer PCI to avoid contrast  overload.  PCI was thus scheduled for Monday, September 14.  Pericatheterization, Sean [name] exhibited ventricular tachycardia with a  cycle length of 580 milliseconds.  This was felt to be secondary to  ischemia in light of his findings on catheterization, and he was placed  on Lidocaine infusion.  Electrophysiology was consulted and as the  patient continued to have intermittent ventricular tachycardia despite  Lidocaine, Lidocaine was discontinued in favor of amiodarone therapy.  Over the weekend, the patient had significant improvement in VT  with  resolution.  He was taken back to the cath lab on September 14 where he  underwent successful PCI and stenting of the vein graft to the OM2 with  placement of a 3.5 x 23 mm Vision bare metal stent.  Following PCI Mr.  [name] was in no additional ventricular tachycardia.  Amiodarone has  been discontinued.  We have not reinitiated beta blocker in the setting  of his high grade first degree AV block.  Sean [name] has recommended  continued current medical course with regards to Sean [name] VT, and  that if he has recurrence, then he would have to strongly consider  radiofrequency catheter ablation versus other drug options.   On September 15, Sean [name] was noted to have swelling and erythema at  IV infiltration sites on bilateral arms.  Infectious Disease was  consulted and felt that this was most likely consistent with acute drug  reaction and recommended warm compresses and blood cultures.  They did  not feel antibiotics were necessary.  Blood cultures have shown no  growth to date and an ultrasound of the left upper extremity showed no  evidence of DVT with superficial thrombosis of the forearm basilic vein  at the site of the infiltration.   Sean [name] has been ambulating without recurrent symptoms or  limitations.  He has been reinitiated on Coumadin therapy and will be  discharged home today in good condition.  We have arranged for Coumadin  Clinic followup on September 21 at 8:15 a.m. to be followed by followup  with Dr. Riley [name] on October 8 at 2:45 p.m.   DISCHARGE LABORATORIES:  Hemoglobin 15.3, hematocrit 45.5, WBC 7.4,  platelets 176, MCV 89.9.  Sodium 134, potassium 4.6, chloride 97, CO2 is  27, BUN 25, creatinine 1.56, glucose 112.  INR 1.2.  Total bilirubin  1.6.  Alkaline phosphatase 35, AST 23, ALT 20, albumin 3.8.  Calcium  9.8.   DISPOSITION:  The patient is being discharged home today in good  condition.  Followup plans and appointments as above.  Patient has   Coumadin Clinic followup on September 21 at 8:15 a.m.  Followup with Dr.  Riley [name] October 8 at 2:45 p.m.  Best to follow up with Dr. Caryl Never in  the next 2-4 weeks.   DISCHARGE MEDICATIONS:  1. Aspirin 325 mg daily.  2. Plavix 75 mg daily.  3. Simvastatin 40 mg nightly.  4. Tricor 145 daily.  5. Coumadin 5 mg Monday, Wednesday, Friday.  2.5 mg Tuesday, Thursday,      Saturday, Sunday.  6. Imdur 60 mg daily.  7. Omeprazole 20 mg daily.  8. Flomax 0.4 mg daily.  9. Nitroglycerin 0.4 mg sublingual p.r.n. chest pain.  10.Hydrochlorothiazide 25 mg daily.  11.K-Dur 10 mEq 2 tablets daily.   OUTSTANDING LABORATORY STUDIES:  None.   Duration of discharge encounter:  60 minutes including physician time.      Nicolasa Ducking, ANP      Sean Morton. [name], MD, [facility]  Electronically Signed    CB/MEDQ  D:  10/21/2007  T:  10/21/2007  Job:  [id number]   cc:   Sean [name], M.D.

## 2010-06-18 NOTE — Assessment & Plan Note (Signed)
[facility]                         ELECTROPHYSIOLOGY OFFICE NOTE   NAME:Sean [name], Sean [name]                     MRN:          [id number]  DATE:03/17/2008                            DOB:          [birth year]    HISTORY OF PRESENT ILLNESS:  Mr. Payes is seen in followup for  ventricular tachycardia in the setting of ischemic heart disease and a  prior ICD implantation.  He is doing quite well from arrhythmia point-of-  view.   He is limited in his activity level.  He is short of breath, climbing a  flight of stairs.  He was able to shovel some snow and when weather is  good he is able to walk a mile, and he says about 20 minutes.  Dr.  [name] mentions in his note about concerns about his dyspnea and  whether CRT upgrade should be considered.  With antecedent history of  device infection, would all give a little bit of a pause   MEDICATIONS:  Currently include  1. Coumadin.  2. Aspirin.  3. Hydrochlorothiazide 25.  4. Simvastatin 40.  5. Fenofibrate 160.  6. Imdur 60.  7. Notably, he is not on an ACE inhibitor with a borderline elevated      creatinine of 1.57 and he is also not on a beta-blocker.   PHYSICAL EXAMINATION:  VITAL SIGNS:  Today his blood pressure was  132/69, his pulse was 74 and irregular and weight was 217, which is  stable.  NECK:  His neck veins were flat.  LUNGS:  Clear.  HEART:  Heart rhythm were irregular with a 2/6 holosystolic murmur that  did not change with variable RR.  ABDOMEN:  Soft.  EXTREMITIES:  No edema.   Interrogation of his Guidant ICD demonstrated an R-wave of 19.4,  impedance of 613, a threshold 0.6 at 0.4.  Battery voltage was 3.05.   Electrocardiogram from his visit with Dr. [name] in January  demonstrated sinus rhythm with second-degree AV block, type 1 with a  fairly prolonged PR intervals when he was running in sinus of 375  milliseconds.   IMPRESSION:  1. Ischemic heart disease.  2. Depressed  left ventricular function.  3. Ventricular tachycardia.  4. Status post implantable cardioverter-defibrillator with history of      infection and requiring explantation revision.  5. Congestive heart failure - Class II to III.  6. First-degree atrioventricular block/second-degree atrioventricular      block, type 1 with intraventricular conduction delay? Contributing      to the above.   Mr. Strahm has not had any tachyarrhythmias.  His functional status is  not what he would like it to be.  Dyspnea is his major complaint;  however on history, he is able to do modest amount.  I thought CPX might  be useful in trying to quantitate his limitations and I will discuss  this option with Dr. [name].  In the event, that it would turned out to  be more profound than we anticipate, consideration could be given to  upgrade his device with both a atrial lead to try to impact his  first-  degree AV block and atrial contribution to be filling, as well as LV  lead for his IVCD.   We will plan to see him again in 3 months in the Device Clinic.     Duke Salvia, MD, [facility]  Electronically Signed    SCK/MedQ  DD: 03/17/2008  DT: 03/17/2008  Job #: [id number]

## 2010-06-18 NOTE — Assessment & Plan Note (Signed)
[facility]                            CARDIOLOGY OFFICE NOTE   Sanad, Fearnow MARRION ACCOMANDO                     MRN:          [id number]  DATE:09/28/2007                            DOB:          December 22, 1931    Evrett is in for followup.  In general, he has been stable.  He has  been a little short of breath, but in general has got along reasonably  well.  He has been followed up by Dr. Retta Diones, and he has been told  that a CT scan looks satisfactory.  They are going to continue to follow  this.  The patient also has been seen by Dr. Caryn Section and laboratory studies  obtained.  There has not been a significant change in his overall  status.   On exam today, blood pressure is 128/72, the pulse is 80.  The lung  fields are clear.  The cardiac exam reveals an apical systolic murmur  which is holosystolic in nature.   Electrocardiogram demonstrates sinus rhythm with marked first-degree AV  block and left ventricular hypertrophy with repolarization and widening.  As noted, this has been evaluated previously by Dr. Ladona Ridgel.   At the present time, we will see him back in followup in about 3 months.  At that time, we will order a repeat renal ultrasound.  Should there be  any problems in the interim, he is to contact us.  Otherwise, he will  stay on the same medications.     [name]. [name], MD, [facility]  Electronically Signed    TDS/MedQ  DD: 09/28/2007  DT: 09/29/2007  Job #: [id number]

## 2010-06-21 NOTE — Discharge Summary (Signed)
NAME:  Sean [name], Sean [name] NO.:  [id number]   MEDICAL RECORD NO.:  [id number]                   PATIENT TYPE:  OIB   LOCATION:  2020                                 FACILITY:  [facility]   PHYSICIAN:  Lavella Hammock, P.A. [facility]            DATE OF BIRTH:  [birth year]   DATE OF ADMISSION:  DATE OF DISCHARGE:  07/06/2002                           DISCHARGE SUMMARY - REFERRING   PROCEDURES:  1. Cardiac catheterization and current coronary arteriogram.  2. Left ventriculogram.  3. Graft angiogram x4.  4. LIMA arteriogram.   HISTORY OF PRESENT ILLNESS:  The patient is a 77 year old male with a  history of  aorto coronary bypass surgery in 1991 with a redo in 1999. He  was seen in the office on Jun 29, 2002, by Dr. Graciela Husbands, and described  progressive anginal symptoms. He was scheduled for a cardiac catheterization  and admitted for this on July 05, 2002.   HOSPITAL COURSE:  The cardiac catheterization showed a normal left main, and  an LAD that was totaled in the midportion. The circumflex was totaled  proximally and the RCA was totalled distally. The LIMA to the LAD was  patent. The SVG to PDA was occluded. The SVG to OM1 was occluded and the SVG  to the distal LAD was occluded. The SVG to the circumflex was patent. His  ejection fraction was 32% with 1 to 2+ MR.  Because there was continued patency of the SVG graft and the LIMA graft, as  well as collateralization to the OM1 RCA system, continued medical therapy  was felt the best option.   The next day he had had some diarrhea  during the night but this had  resolved. His groin was stable without ecchymosis  or hematomas. He was  considered stable for discharge on July 06, 2002, with outpatient followup  arranged.   CONDITION ON DISCHARGE:  Stable.   DISCHARGE DIAGNOSES:  1. Anginal pain secondary to ischemic cardiomyopathy, medical therapy     recommended.  2. Status post  aorto coronary bypass surgery in  1991 with a redo in 1999.  3. Left internal mammary artery to left anterior descending artery and     saphenous vein graft to obtuse marginal 2 only patent graft by     catheterization this admission.  4. Ischemic cardiomyopathy with an ejection fraction of 30% by     catheterization this admission.  5. Status post lung biopsy in 1987 as well as 2 hernia operations,     vasectomy, tonsillectomy, appendectomy, rotator cuff repair and     cholecystectomy.  6. History of constipation on Metamucil.  7. History of reflux symptoms.  8. History of osteoarthritis.  9. History of psoriasis.  10.      Monomorphic PT, status post AICD.  11.      Dyslipidemia.  12.      History of peptic ulcer disease.  13.  Mitral regurgitation, 1 to 2+ by catheterization this admission.  14.      Status post remote myocardial infarction x2.  15.      Decreased tolerance to Altace secondary to cough.   ALLERGIES:  PENICILLIN AND PLASTIC TAPE.   DISCHARGE INSTRUCTIONS:  His activity level is to be limited for the next  couple of days. He is to resume his home medications which include an  increased dose of Imdur. He is to clean the catheterization site with soap  and water.   FOLLOW UP:  He is to follow up with Dr. Graciela Husbands and with Dr. Samuella Cota as well as  Dr. [name].   DISCHARGE MEDICATIONS:  1. Imdur 60 mg p.o. b.i.d.  2. Atenolol 50 mg every day.  3. Altace 2.5 mg p.o. every day.  4. Aspirin 81 mg p.o. every day.  5. Nexium 40 mg p.o. every day.  6. Niacin 500 mg 2 tablets q. h.s.  7. HCTZ 12.5 mg every day.  8. Advil p.r.n.  9. Metamucil every day.                                               Lavella Hammock, P.A. [facility]    RG/MEDQ  D:  07/06/2002  T:  07/06/2002  Job:  [id number]   cc:   Duke Salvia, M.D.   Teena Irani. Arlyce Dice, M.D.  [address]  [place]  [place] [address]  Fax: [phone]   Donnie Coffin. Samuella Cota, M.D.  [address], [address]  [place]  [place] [address]  Fax: [phone]

## 2010-06-21 NOTE — Assessment & Plan Note (Signed)
[facility]                            CARDIOLOGY OFFICE NOTE   Jep, Dyas DAEMYN GARIEPY                     MRN:          [id number]  DATE:01/02/2006                            DOB:          [birth year]    Mr. Peeks is in for followup.  Cardiac-wise he seems to be doing well.  He is tolerating his Coumadin, recently presented with a pulmonary  embolism.  We had him scheduled to see Dr. Marina Gravel.  Today he had an  ultrasound of the renal arteries.  This revealed normal caliber aorta,  the IVC was patent, the kidneys were normal and symmetrical in size.  There was flow in the parenchyma.  There were elevated velocities in the  proximal right renal artery suggesting a 1% to 59% renal artery  stenosis, with a normal left renal artery.   The patient's main complaint has been some constipation and some  discomfort in the left lower quadrant.  It has improved over the past  couple of days.  He has had a previous colonoscopy suggesting the  possibility of diverticular disease.  He has also had a hernia repair on  the left.  He said his dog was sitting on his lap and he noticed that  there was some tenderness in the left lower quadrant.   PHYSICAL EXAMINATION:  The blood pressure is 138/72, the pulse is 65.  LUNGS:  The lung fields are clear to auscultation and percussion.  ABDOMEN:  Soft, bowel sounds are present.  There is some tenderness in  the left lower quadrant, but no evidence of rebound.  EXTREMITIES:  Revealed no edema.   The patient continues to get along reasonably well following his  pulmonary embolus.  He does have some abdominal discomfort, and the  etiology of this is not quite clear.  He also has a mildly elevated  creatinine.  Importantly, when he was in the hospital his urinalysis was  entirely normal.  Renal size is normal, there is a mild renal artery  stenosis on the right.   We have recommended that he see Dr. Caryl Never back in  followup.  He may  need a CT of the abdomen if the symptoms persist, and we will defer that  decision.  Secondly, he will see nephrology to help decide whether any  further studies are warranted or change in medication.  I will see him  back in followup in about 6 weeks.     [name]. [name], MD, [facility]  Electronically Signed    TDS/MedQ  DD: 01/02/2006  DT: 01/03/2006  Job #: [id number]   cc:   Wilber Bihari. Caryn Section, M.D.  Evelena Peat, M.D.

## 2010-06-21 NOTE — Consult Note (Signed)
NAMEINFANT, ZINK NO.:  [id number]   MEDICAL RECORD NO.:  [id number]          PATIENT TYPE:  INP   LOCATION:  2630                         FACILITY:  [facility]   PHYSICIAN:  Doylene Canning. Ladona Ridgel, M.D.  DATE OF BIRTH:  November 16, 1931   DATE OF CONSULTATION:  03/06/2005  DATE OF DISCHARGE:                                   CONSULTATION   CARDIOLOGIST:  [name]. [name], M.D. [facility]   PRIMARY CARE PHYSICIAN:  Teena Irani. Arlyce Dice, M.D.   ELECTROPHYSIOLOGIST:  Duke Salvia, M.D.   ALLERGIES:  PENICILLIN, CLOTH TAPE.   CHIEF COMPLAINT:  My device is infected.   HISTORY OF PRESENT ILLNESS:  Mr. Calvey is a 76 year old male with a history  of myocardial infarction x2.  His first was in 1991 followed by his first  coronary artery bypass graft surgery.  Four bypasses were placed.  The  second was in 1999 followed by a redo surgery.  Four more bypasses were  placed.  He had a catheterization in December of 2006 and showed that the  native disease was extensive.  The left main had a 30% stenosis, LAD 100%  after the first septal.  Left circumflex 100% proximally and the right  coronary artery 100% proximally.  Chronic occlusions to the SVG to the PDA,  SVG to the OM, and SVG to the diagonal.  The SVG to the second obtuse  marginal was patent and the sequential LIMA to the LAD and to the first  diagonal is also patent.  There has been no change since catheterization in  2002 with this study.   The patient had ICD implanted October of 2003 for inducible sustained  monomorphic ventricular tachycardia in the setting of ischemic  cardiomyopathy and decreased left ventricular function which was about 30%.   The patient says that his ICD pocket was always protuberant and the device  was revised on Jun 05, 2004.  The patient has been followed closely since.  There has been a thought that eventually the device/pocket relationship  might fail.  The patient noted purulent discharge from the  caudal aspect of  the pocket yesterday afternoon after taking a shower.  The patient noted  evidence of discharge on his undershirt that he had warn yesterday.  He  presented to the office and was seen by Dr. Antoine Poche, transferred to [facility], and started on IV Vancomycin.  Dr. Ladona Ridgel will undertake  device extraction/lead extraction in the emergency room with CVTS backup on  February 2.   MEDICATIONS:  1.  Altace 2.5 mg daily.  2.  Aspirin 81 mg daily.  3.  Atenolol 50 mg daily.  4.  Isosorbide 60 mg daily.  5.  Tricor 145 mg daily.  6.  Zocor 40 mg daily at bedtime.  7.  Hydrochlorothiazide 12.5 mg daily.  8.  Nexium 40 mg daily.   LABORATORY DATA:  PTT was 28, PT 14.2, INR 1.1.  Serum electrolytes; sodium  136, potassium 4, chloride 101, carbonate 27, BUN 14, creatinine 1.4,  glucose 129.  Complete blood count; white cells 7.2,  hemoglobin 14.6,  hematocrit 42.1, and platelets 191.   PAST MEDICAL HISTORY:  1.  Myocardial infarction x2 as in history of present illness.  2.  CABG in 1991 with redo CABG in 1999.  3.  Hypertension.  4.  Dyslipidemia.  5.  Strong family history of coronary artery disease.  6.  Ischemic cardiomyopathy with ejection fraction of 30%.  7.  Class II congestive heart failure (walks 1 mile daily) (has trouble on      inclines).  8.  Gastroesophageal reflux disease.  9.  Inducible sustained monomorphic ventricular tachycardia with implant of      cardioverter-defibrillator in October of 2003.   SOCIAL HISTORY:  The patient is married for 31 years.  Lives in [place].  No tobacco, no ethanol, and no drugs.  He works at the [employer] as a  Hospital doctor.   FAMILY HISTORY:  Mother died of a myocardial infarction.  Father died of  bacteremia at age 95.  He has three brothers all of whom are deceased and  all had myocardial infarctions.  One died of a myocardial infarction at age  84.  Two sisters living with myocardial infarction, one status  post coronary  artery bypass graft surgery, and one has had stent placed.   REVIEW OF SYSTEMS:  The patient is not having fevers, chills, occasional  night sweats, no weight change. HEENT:  No vertigo, no nasal discharge, no  diplopia.  CARDIOPULMONARY:  No chest pain, no dyspnea, no paroxysmal  nocturnal dyspnea, no dependent edema.  He has occasional palpitations.  He  has no orthopnea.  No syncope.  Very occasional presyncopal symptoms.  GENITOURINARY:  Nocturia x3.  GASTROINTESTINAL:  The patient has GERD for  which he takes Nexium.   PHYSICAL EXAMINATION:  VITAL SIGNS:  Temperature 97.4, blood pressure  113/59, pulse 62 and regular, respirations 18, oxygen saturation is 94% on  room air.  GENERAL:  He has no complaints and is in no acute distress.  At the ICD  pocket, it is swollen.  There are ecchymoses at the caudal area.  There is  one area of erosion which in the recent past has leaked purulent fluid.  HEENT:  Eyes; pupils equal, round, and reactive to light.  Extraocular  movements are intact.  No nasal discharge.  NECK:  Supple, soft carotid bruits auscultated bilaterally.  HEART:  Regular rate and rhythm with an S4.  Telemetry; sinus rhythm.  ABDOMEN:  Soft and nondistended.  Bowel sounds are present.  EXTREMITIES:  No edema. The posterior tibial pulse is 4/4 bilaterally.  NEUROLOGY:  No deficits.   IMPRESSION:  ICD pocket infection.   PLAN:  Generator and lead extraction by Dr. Ladona Ridgel in the operating room  with CVTS backup on March 07, 2005.      Maple Mirza, P.A.    ______________________________  Doylene Canning. Ladona Ridgel, M.D.    GM/MEDQ  D:  03/06/2005  T:  03/07/2005  Job:  [id number]

## 2010-06-21 NOTE — Op Note (Signed)
Sean [name], Sean [name] NO.:  [id number]   MEDICAL RECORD NO.:  [id number]          PATIENT TYPE:  INP   LOCATION:  2011                         FACILITY:  [facility]   PHYSICIAN:  Doylene Canning. Ladona Ridgel, M.D.  DATE OF BIRTH:  [birth year]   DATE OF PROCEDURE:  03/07/2005  DATE OF DISCHARGE:                                 OPERATIVE REPORT   PROCEDURE PERFORMED:  ICD lead and generator extraction.   INDICATIONS:  ICD pocket infection (chronic).   INTRODUCTION:  The patient is a 77 year old male with a history of ischemic  cardiomyopathy and sustained monomorphic VT at EP testing. He underwent ICD  implantation several years ago and approximately 1 year ago demonstrated  near dehiscence of the pocket. He subsequently had his pocket revised and  did well initially but then developed gradual adhesion of his generator to  the skin and narrowing of the skin and presented to hospital with generator  dehiscence through the skin secondary to chronic infection.   PROCEDURE:  After informed consent was obtained, the patient was taken back  to the operating room in a fasting state. Surgical backup was available. The  patient was anesthetized with general anesthesia as per the anesthesiology  service. A 9-French incision was carried out over the ICD insertion site and  electrocautery utilized to dissect down to the fascial plane. The ICD  generator was removed from the pocket with gentle traction. The leads were  evaluated. The pocket examined. There was chronic fibrosis and purulent  material in the pocket. The leads were freed up from their dense fibrous  adhesions with electrocautery. The sewing sleeve was removed. A 64-cm stylet  was advanced into the lead body and the helix was retracted partially. The  body of the lead was turned counterclockwise. Gentle tension was placed on  the lead at that point, but there was no movement of the lead itself. At  this point the [facility] locking  stylet was advanced into the lead and locked in  place. The 11-French [facility] EDS electrosurgery dissection sheath was advanced  over the locking stylet over the lead into the central circulation. There is  a very dense fibrous adhesion site at the clavicle first rib junction as  well as at the innominate SVC junction. These sites were traversed with a  combination of electrocautery and pressure counter pressure, traction and  counter traction. The sheath was essentially advanced all the way into the  right ventricle and down to the tip of the RV/defibrillator lead and the  entire lead was removed with firm but gentle traction. There are no  hemodynamic complications. Pressure was held on the pocket and hemostasis  was obtained. The pocket fibers, the necrotic tissue was dissected free from  the pocket. Electrocautery utilized to assure hemostasis. The incision was  then closed after the pocket was irrigated with copious amounts of kanamycin  usual utilizing multiple mattress sutures. A pressure dressing was placed.  The patient was returned to his room in satisfactory condition.   COMPLICATIONS:  There were no immediate procedure complications.   RESULTS:  This demonstrates  successful removal of a previously implanted (4  years ago) single chamber active fixation defibrillation lead in a patient  with ischemic cardiomyopathy and sustained monomorphic VT. The patient  tolerated the procedure without any procedure complications.           ______________________________  Doylene Canning. Ladona Ridgel, M.D.     GWT/MEDQ  D:  03/07/2005  T:  03/08/2005  Job:  [id number]   cc:   Teena Irani. Arlyce Dice, M.D.  Fax: [phone]   [name]. [name], M.D. [facility]  [address]  [address]  [place]  [place] [address]   Duke Salvia, M.D.  [address]  [address]  [place]  [place] [address]

## 2010-06-21 NOTE — H&P (Signed)
NAMEMERCEDES, [name] NO.:  [id number]   MEDICAL RECORD NO.:  [id number]          PATIENT TYPE:  OIB   LOCATION:  2899                         FACILITY:  [facility]   PHYSICIAN:  Duke Salvia, M.D.  DATE OF BIRTH:  [birth year]   DATE OF ADMISSION:  06/05/2004  DATE OF DISCHARGE:                                HISTORY & PHYSICAL   ELECTROPHYSIOLOGIST:  Duke Salvia, M.D.   CARDIOLOGIST:  [name]. [name], M.D. [facility]   PRIMARY CARE PHYSICIAN:  Teena Irani. Arlyce Dice, M.D.   HISTORY:  Presenting circumstance:  I'm here because my defibrillator is  swollen.   HISTORY OF PRESENT ILLNESS:  Sean [name] is a 77 year old male with a history  of myocardial infarction x2, severe three vessel coronary artery disease, he  is status post coronary artery bypass graft surgery in 1991 with redo  coronary artery bypass graft surgery in 1998.  He has an ischemic  cardiomyopathy and inducible monomorphic ventricular tachycardia.  He is  status post ICD implantation November 29, 2001.  This is a Producer, television/film/video,  [id number] device.  The patient has never had an ICD therapies.  He has no history  of presyncope or syncope.  He has chest pain with extension to the upper  extremities but only when he pushes himself walking.  He has no orthopnea,  no paroxysmal nocturnal dyspnea, no lower extremity edema.  He has class II  congestive heart failure symptoms and has dyspnea when he pushes himself too  hard.  About three weeks ago, the patient noted a change in the ICD site.  The device in its subcutaneous position, has become more prominent with  tissue discoloration at the cephalad aspect.  The patient says he has become  sensitive as well.  This was also noted on the office on a routine ICD  check.  He was seen by Dr. Graciela Husbands on May 31, 2004 and scheduled for  elective admission for a:  1.  Pocket revision.  2.  Release adhesions.  3.  Culture of any fluid.  If culture is positive, the device will  need to      be removed.   ALLERGIES:  THE PATIENT HAS A PENICILLIN ALLERGY.   CURRENT MEDICATIONS:  1.  Altace 2.5 mg daily.  2.  Enteric-coated aspirin 81 mg daily.  3.  Hydrochlorothiazide 12.5 mg daily.  4.  Zocor 40 mg daily at bedtime.  5.  Atenolol 50 mg daily.  6.  Isosorbide 60 mg daily.  7.  Nexium 40 mg daily.  8.  Tricor 145 mg daily.   SOCIAL HISTORY:  The patient lives in [place].  He is married.  No  tobacco, no alcohol, no drugs.  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   FAMILY HISTORY:  Not obtained this hospitalization.   REVIEW OF SYSTEMS:  The patient has no current weight change, either loss or  gain.  No adenopathy.  He says he has sweats every night.  HEENT:  No  epistaxis.  No photophobia.  No diplopia.  Not  hard of hearing.  No nasal  discharge.  INTEGUMENT:  No rashes or nonhealing ulcerations.  CARDIOPULMONARY:  Chest pain as illustrated in history of present illness  with exertion, extending to the upper extremities, resolving with rest.  He  has no paroxysmal nocturnal dyspnea.  He has congestive heart failure class  II symptoms and only gets short of breath when he pushes himself too hard  while walking.  He has no lower extremity edema.  No palpitations.  No  presyncope or syncope.  UROGENITAL:  The patient has infrequent nocturia.  No hematuria.  No dysuria.  No frequency or urgency.  GASTROINTESTINAL:  He  has a history of chronic heartburn.  No history of gastrointestinal bleed or  peptic ulcer disease.  ENDOCRINE:  Thyroid status unknown and the patient  denies diabetes.   PHYSICAL EXAMINATION:  VITAL SIGNS:  Temperature is 97.6, pulse is 73,  respirations 20, blood pressure 137/57, oxygen saturation 97% on room air.  The patient is 6 feet, weighs 212 pounds.  GENERAL:  Alert and oriented x3, no acute distress.  HEENT:  Eyes:  Pupils equal, round, reactive to light. Extraocular movements  are intact.  Nares  are patent.  NECK:  Supple.  No carotid bruits auscultated.  No jugulovenous distention.  LUNGS:  Clear to auscultation and percussion bilaterally.  HEART: A regular rate and rhythm without murmur.  ABDOMEN:  Soft and nondistended.  Bowel sounds are present.  The abdominal  aorta is not palpated.  EXTREMITIES:  Lower extremities:  The radial pulses are 4/4 bilaterally.  Dorsalis pedis pulses are 3/4 bilaterally.  No evidence of edema or brawny  edema, varicosities.  He has a well-healed median sternotomy incision, a  well-healed cicatrices from saphenous vein graft harvesting in both legs.   IMPRESSION:  1.  Admitted for elective ICD pocket revision to reduce adhesions and to      culture for any infection.  2.  History of myocardial infarction x2.  3.  Severe three vessel coronary artery disease with coronary artery bypass      graft surgery in 1992, redo coronary artery bypass graft surgery in      1998.  4.  Hypertension.  5.  Dyslipidemia.  6.  Gastroesophageal reflux disease/peptic ulcer disease.  7.  Implantation of ICD for ischemic cardiomyopathy and inducible      monomorphic ventricular tachycardia October of 2003.  A Guidant Prizm      1860, model 1860.  8.  Class II congestive heart failure symptoms.  9.  Exertional angina.   PLAN:  The plan is for the patient to be admitted to have revision of his  ICD pocket and culture of any fluid discovered during the procedure.      GM/MEDQ  D:  06/05/2004  T:  06/05/2004  Job:  [id number]

## 2010-06-21 NOTE — Op Note (Signed)
NAME:  Sean [name], HASELTON NO.:  [id number]   MEDICAL RECORD NO.:  [id number]                   PATIENT TYPE:  OIB   LOCATION:  2899                                 FACILITY:  [facility]   PHYSICIAN:  Duke Salvia, M.D. [facility]           DATE OF BIRTH:  11-04-1931   DATE OF PROCEDURE:  11/29/2001  DATE OF DISCHARGE:                                 OPERATIVE REPORT   PREOPERATIVE DIAGNOSIS:  Nonsustained ventricular tachycardia.   POSTOPERATIVE DIAGNOSIS:  Sustained monomorphic ventricular tachycardia.   PROCEDURE:  Invasive electrophysiological study.   DESCRIPTION OF PROCEDURE:  Following the obtaining of informed consent, the  patient was brought to the electrophysiology laboratory and placed on the  fluoroscopic table in the supine position.  After routine prep and drape,  cardiac catheterization was performed with local anesthesia and conscious  sedation.  Noninvasive blood pressure monitoring, transcutaneous oxygen  saturation monitoring, and end-tidal CO2 monitoring were performed  continuously throughout the procedure.  Following the procedure, the  catheters were removed, hemostasis was obtained, and the patient was then  prepared for ICD implantation in stable condition.   CATHETERS:  A 5 French quadripolar catheter was inserted via the left  femoral vein to the AV junction.  A 5 French quadripolar catheter was  inserted via the left femoral vein to the right ventricular apex,  subsequently moved to the right ventricular outflow tract and into the high  right atrium.   Surface leads I, aVF, and V1 were monitored continuously throughout the  procedure.  Following insertion of the catheters, the stimulation protocol  included incremental atrial pacing, incremental ventricular pacing, single  atrial extrastimuli at a pace cycle length of 700 msec, single and double  ventricular extrastimuli from the right ventricular apex at a pace cycle  length of  400:600 msec, double and triple ventricular extrastimuli from the  right ventricular apex at a pace cycle length of 600 and 400 msec, double  ventricular extrastimuli from the right ventricular outflow tract at pace  cycle lengths of 600 and 400 msec.   RESULTS:  SURFACE ELECTROCARDIOGRAM:  Rhythm:  Sinus.  Cycle length:  1120 msec.  PR interval:  245 msec.  QRS duration:  100 msec.  QT interval:  49 msec.  P-wave duration:  Not measured.  Bundle branch block was absent.  Pre-excitation was absent.   AV NODAL FUNCTION  AH interval:  137 msec.  AV Wenckebach:  550 msec.  VA Wenckebach:  500 msec.  AV nodal effective refractory period at a pace cycle length of 700 msec was  480 msec, and there was no evidence of dual AV nodal physiology.   HIS-PURKINJE SYSTEM FUNCTION:  HV interval:  63 msec, normal 35-55 msec.  His bundle duration:  26 msec.   ACCESSORY PATHWAY FUNCTION:  No evidence of an accessory pathway was  identified.   VENTRICULAR RESPONSE TO PROGRAMMED STIMULATION:  The effective  refractory  period of the right ventricular apex at a pace cycle length of 600 msec was  260 msec and at 400 msec was 240 msec.  The effective refractory period at the right ventricular outflow tract at a  pace cycle length of 600 msec was 260 msec and at 400 msec was less than 270  msec.  The closest coupling interval from the right ventricular apex at a pace  cycle length of 600 msec was 250:210:200 and at 400 msec was 250:200:200.  The closest coupling interval at the right ventricular outflow tract at a  pace cycle length of 600 msec was 280:250.   ARRHYTHMIAS INDUCED:  Ventricular tachycardia with a right bundle branch  block, left axis deviation morphology and a cycle length of 256 msec was  induced from the right ventricular outflow tract at 400:270:230.  It was  terminated after multiple bursts at a ventricular pacing cycle length of 230  msec.   IMPRESSION:  1. Sinus  bradycardia.  2. Prolonged interatrial conduction times.  3. Prolonged AV nodal conduction without evidence of dual AV nodal     physiology.  4. Modest prolongation of the HV interval.  5. No accessory pathway.  6. Inducible sustained monomorphic ventricular tachycardia.   SUMMARY AND CONCLUSION:  The results of electrophysiological testing  identified a substrate for potentially malignant ventricular arrhythmias  based on the MADIT and MUSTT trials.  Based on these results, defibrillator  implantation is recommended.                                                 Duke Salvia, M.D. [facility]    SCK/MEDQ  D:  11/29/2001  T:  11/29/2001  Job:  [id number]   cc:   Electrophysiology Lab   [place]   Teena Irani. Arlyce Dice, M.D.  [address]  [place]  [place] [address]  Fax: [phone]

## 2010-06-21 NOTE — Assessment & Plan Note (Signed)
[facility]                              CARDIOLOGY OFFICE NOTE   Zafir, Schauer Sean [name]                     MRN:          [id number]  DATE:12/15/2005                            DOB:          1931/12/27    [name] is in for a follow-up visit.  To briefly summarize, he is doing  reasonably well.  His shortness of breath is improved.  The patient had  evidence of a pulmonary embolism and DVT by Doppler, although this was not  observable on exam.  He has had recently a mildly elevated creatinine, which  was exacerbated by both ACE inhibitors and ARBs.  He has no abdominal bruit.  The creatinine has risen slightly up to 1.  His urinalysis has been  negative.   PHYSICAL EXAMINATION:  VITAL SIGNS:  Today on exam, blood pressure 129/62,  pulse 60.  LUNGS:  Lung fields are clear.  CARDIAC:  Rhythm is regular.  I cannot appreciate even an abdominal bruit.   Overall, Sean [name] is stable.  His INR has been stable.  He has not been  having any increasing cardiac symptoms, and his symptoms from his pulmonary  embolus, I think have all been abated.  He has not had an obvious abdominal  bruit, but he does have extensive coronary artery disease with an  abnormality based on rise of creatinine associated with ARBs and/or ACE's.  Based on this, I think it would be worthwhile to do an abdominal ultrasound  with renal artery ultrasound to assess both kidney size and look for renal  artery stenosis.  We will also begin arrangements to have him seen by  nephrology for long-term kidney followup.     [name]. [name], MD, [facility]  Electronically Signed    TDS/MedQ  DD: 12/15/2005  DT: 12/15/2005  Job #: [id number]

## 2010-06-21 NOTE — Assessment & Plan Note (Signed)
[facility]                            CARDIOLOGY OFFICE NOTE   Sean [name], Sean [name]                     MRN:          [id number]  DATE:08/12/2006                            DOB:          08-Apr-1931    Sean [name] is in for followup.  He is stable.  He has not been having chest  pain or significant shortness of breath.  He does have some limitation  of his activities, but none of it has progressed from what he had  previously.  He remains on Warfarin anticoagulation for a prior  pulmonary embolus.   PHYSICAL EXAMINATION:  On physical examination today, the blood pressure  is 124/62, the pulse is 64.  The lung fields are clear and the cardiac  rhythm is regular.  There is a soft apical murmur, compatible with known  mitral regurgitation.  Extremities reveal no edema.   The EKG reveals normal sinus rhythm, first degree AV block, left axis  deviation and interventricular conduction delay.   IMPRESSION:  1. Coronary artery disease with prior coronary artery bypass graft      surgery.  2. History of hypertension.  3. Mild chronic kidney disease.  4. Mixed hyperlipidemia on lipid-lowering therapy.  5. Pulmonary embolism, treated with Coumadin anticoagulation.  6. History of mitral regurgitation.  7. History of prior implantation of an implantable cardiac      defibrillator with subsequent dehiscence for monomorphic      ventricular tachycardia.   PLAN:  1. Continue current medical regimen.  2. Follow up in three months.  3. Continue followup with Dr. Caryl Never.  4. Basic metabolic profile.     [name]. [name], MD, [facility]  Electronically Signed    TDS/MedQ  DD: 08/11/2006  DT: 08/12/2006  Job #: [id number]   cc:   Evelena Peat, M.D.

## 2010-06-21 NOTE — Cardiovascular Report (Signed)
[place]. [facility]  Patient:    Sean [name], Sean [name]                     MRN: [id number] Proc. Date: 08/03/00 Adm. Date:  [id number] Attending:  Ronaldo Miyamoto CC:         Cardiac Catheterization Laboratory  Teena Irani. Arlyce Dice, M.D.   Cardiac Catheterization  INDICATIONS:  Sean [name] is well known to me.  He is a 77 year old, who has had two prior episodes of bypass surgery.  He has been treated medically and has had chronic angina and on his last catheterization he had a patent OM graft, but all of the remaining vein grafts were occluded.  His IMA was also patent, but his LAD was somewhat diffusely diseased distally and likewise the septal perforator was involved.  The current study was done to assess coronary anatomy.  PROCEDURES PERFORMED: 1. Left heart catheterization. 2. Selective coronary arteriography. 3. Selective left ventriculography. 4. Saphenous vein graft angiography x 4. 5. Selective left internal mammary angiography.  DESCRIPTION OF PROCEDURE:  The procedure was performed from the right femoral artery using 6-French catheters.  The aorta and iliac appeared to be fairly heavily calcified.  We used a glide wire to get past the subclavian, and there was no gradient across the subclavian stenosis, but there was about a 50% left subclavian stenosis.  The right coronary artery also had an anterior takeoff and we were nonselective, but this vessel was adequately visualized.  The family was then invited into the holding area to review the films.  He tolerated the procedure without complications.  HEMODYNAMIC DATA:  The central aortic pressure was 137/67, LV pressure 137/11. There was no gradient on pullback across the aortic valve.  ANGIOGRAPHIC DATA: 1. Ventriculography was performed in the RAO projection.  There was severe    inferobasilar hypokinesis and moderate inferior hypokinesis.  Ejection    fraction was calculated at 43.3%. 2.  The left main coronary artery was free of critical disease. 3. The LAD was totally occluded after the septal perforator.  The septal    perforator itself had 90% narrowing leading into two subbranches.  This    was hazy and appeared to be slightly progressed from the previous study.    It was not very ideal for percutaneous intervention.  It involved at least    both large septal systems. 4. The left internal mammary into the diagonal is widely patent.  Importantly,    the diagonal fills the LAD.  The LAD has tandem lesions of about 70% and    the very apical portion of this vessel is diffusely diseased. 5. The circumflex is totally occluded in its proximal and mid portion after    a small first marginal. 6. The saphenous vein graft to the large second obtuse marginal is widely    patent.  This vessel fills the distal RCA territory. 7. The right coronary artery has an anterior takeoff and is diffusely diseased    and is totally occluded beyond the acute marginal. There are faint    collaterals distally going into the distal right coronary circulation.  CONCLUSIONS: 1. Moderate reduction of the global left ventricular function with an    inferobasilar wall motion abnormality. 2. Known occlusion of the saphenous vein grafts to the distal left anterior    descending artery, saphenous vein graft to the obtuse marginal-1, and    saphenous vein graft to the posterior descending artery, all of  which are    documented to be occluded on this particular study. 3. Continued patency of the saphenous vein graft to the obtuse marginal-2. 4. Continued patency of the internal mammary to the diagonal, with diffuse    distal left anterior descending coronary artery disease. 5. Slight to moderate progression of disease involving a bifurcation into    the septal perforator. 6. Known total occlusion of the right coronary artery with known occlusion    of the right coronary artery graft with retrograde  collateralization of    this vessel.  DISPOSITION:  The patients anatomy is not changed dramatically.  His two grafts are still open.  The distal LAD could cause ischemia, as could the septal perforating vessels.  Also, the distal right could cause ischemia. Continued medical therapy is warranted.  We may elect to add EECP to this regimen.  I will discuss these options with the patient. DD:  08/03/00 TD:  08/03/00 Job: [id number]

## 2010-06-21 NOTE — Assessment & Plan Note (Signed)
[facility]                              CARDIOLOGY OFFICE NOTE   NAME:[name], Sean [name]                     MRN:          [id number]  DATE:11/27/2005                            DOB:          07/24/1931    Sean [name] is in for followup.  In general, he is stable.  He was recently  admitted to the hospital with weakness and shortness of breath.  He was  subsequently found to have a V/Q mismatch.  Because of his elevated  creatinine, he did not have a CT angio, but it was a high-risk study.  We  also got a Doppler which demonstrated a clot in the leg, although there was  nothing visible on examination.  He has not had weight loss or significant  constitutional symptoms.   EXAMINATION:  The blood pressure is 115/60 and the pulse is 63.  The lung fields are clear.  The pacer sites look good.  EXTREMITIES:  No edema.  There is absolutely no evidence of DVT on exam.   He went to the clinic today to get an INR and it was high.  So he is going  to the lab to get a blood draw INR.  I plan to see him back in followup in 2  weeks.  Should he have any trouble in the interim, he is to call us  promptly.  We will also get a basic metabolic profile.     [name]. [name], MD, [facility]    TDS/MedQ  DD: 11/27/2005  DT: 11/28/2005  Job #: [id number]

## 2010-06-21 NOTE — Assessment & Plan Note (Signed)
[facility]                            CARDIOLOGY OFFICE NOTE   Demyan, Fugate DODGE ATOR                     MRN:          [id number]  DATE:04/23/2006                            DOB:          09/19/1931    Baine is in for followup.  In general, he has been stable, although he  gets moderately short of breath with exertion.  He occasionally has  palpitations.  His defibrillator site has healed.  He had an infected  toe that was treated with antibiotics.  He questioned whether it might  be gout, although this was never resolved and the toe improved.   MEDICATIONS:  1. Coumadin as directed.  2. Aspirin 81 mg daily.  3. Atenolol 25 mg daily.  4. Isosorbide 90 mg 1-and-a-half tablets daily, which is equal to 90      mg.  5. Prilosec 20 mg daily.  6. TriCor 145 mg daily.  7. KCl 10 mEq 2 tablets daily.  8. Simvastatin 40 mg daily.  9. Hydrochlorothiazide 25 mg 2 tablets daily.  10.Stool softener.  11.Flomax 0.4 daily.   PHYSICAL EXAMINATION:  The blood pressure is 125/65, weight 218 pounds,  pulse 69.  There are minimal transmitted sounds from the heart at the base.  LUNGS:  Fields are clear to auscultation and percussion.  PMI is not displaced.  There is an apical murmur consistent with mitral  regurgitation.  EXTREMITIES:  No edema.  His toe looks much improved.   His most recent laboratory studies done outside reveal a creatinine of  1.7, BUN 22, and his potassium was 4.5.   IMPRESSION:  1. Coronary artery disease status post repeat surgery.  2. Mild chronic renal insufficiency off angiotensin-converting enzyme      inhibitor.  3. Status post implantable cardioverter defibrillator with subsequent      infection and subsequent resolution.  4. Mitral regurgitation.  5. Hypertension.  6. Mixed hyperlipidemia.   PLAN:  1. Return to clinic in 2 months.  2. At that time, we will repeat a basic metabolic profile.   ADDENDUM:  The patient has  also had a pulmonary embolus and is on long-  term Coumadin.  Dr. Arlyce Dice was following his CAT scans in the past.  He  apparently has left the practice.     [name]. [name], MD, [facility]  Electronically Signed    TDS/MedQ  DD: 04/23/2006  DT: 04/23/2006  Job #: [id number]

## 2010-06-21 NOTE — Op Note (Signed)
NAME:  ARVAL, BRANDSTETTER NO.:  [id number]   MEDICAL RECORD NO.:  [id number]                   PATIENT TYPE:  OIB   LOCATION:  2899                                 FACILITY:  [facility]   PHYSICIAN:  Maisie Fus B. Samuella Cota, M.D.               DATE OF BIRTH:  November 03, 1931   DATE OF PROCEDURE:  04/18/2002  DATE OF DISCHARGE:                                 OPERATIVE REPORT   PREOPERATIVE DIAGNOSIS:  Recurrent left inguinal hernia.   POSTOPERATIVE DIAGNOSIS:  Recurrent left inguinal hernia.  (Recurrent  indirect sliding hernia.   PROCEDURE:  Repair of recurrent indirect sliding left inguinal hernia with  mesh.   SURGEON:  Maisie Fus B. Samuella Cota, M.D.   ANESTHESIA:  General.  Anesthesiologists are Bedelia Person, M.D. and CRNA.   DESCRIPTION OF PROCEDURE:  The patient was taken to the operating room and  placed on the table in the supine position.  After satisfactory general  anesthesia with intubation, the left lower quadrant of the abdomen was  prepped and draped in a sterile field.  The patient had a previous left  inguinal herniorrhaphy about 50 years ago.  The incision was made through  the old scar.  Prior to making the incision, 10 mL of 0.25% Marcaine without  epinephrine, 1% lidocaine without epinephrine, and sodium bicarbonate was  used to block the ilioinguinal nerve and the area for the incision.  The  incision was made through the old scar and after passing through the  subcutaneous tissue, it was noted that the patient had had a previous  Psychiatrist with the cord structures in the subcutaneous position.  The  cord structures were freed up and then the external oblique aponeurosis was  divided and dissected free.  The external oblique aponeurosis had been  sutured to itself below the cord structures.  The external oblique  aponeurosis was freed up inferiorly and superiorly. The dissection at the  internal ring revealed a hernia sac.  The hernia sac was opened  on the  anterior and medial aspect of the sac and it was noted that the patient had  a sliding hernia with some colon adhered to the posterior wall.  The excess  hernia sac was removed and the peritoneum closed with a 0 chromic catgut as  a Hotchkiss maneuver.  This closed the peritoneum and turned in the sliding  component. A running suture of 0 chromic was then used to close up the  internal ring which was slightly dilated.  Sutures were placed from conjoin  tendon to shelving edge of Poupart's ligament.  The dissection was fairly  tedious, but the external oblique aponeurosis was dissected free inferiorly  and superiorly.  Before the cord structures were returned to their anatomic  position, a piece of 3 inch x 6 inch mesh was fashioned to cover the entire  inguinal floor and extend around the cord structures superiorly and  laterally.  The mesh was anchored inferiorly with a running suture of 2-0  Novafil with the first suture being placed in the pubic tubercle, the other  suture being placed in the shelving edge of Poupart's ligament. The mesh was  anchored superiorly and medially with interrupted sutures of 0 Novafil. A  slit was made in the mesh to accommodate the cord structures and the  internal ring. The mesh was then sutured to itself superior and lateral to  the internal ring. This was done with 3-0 Vicryl.  The mesh seemed to be  lying nicely, covering the entire inguinal floor.  There was sufficient room  through the mesh to accommodate the cord structures.  The cord structures  were then returned to their anatomic position and the external oblique  aponeurosis was reapproximated with interrupted sutures of 3-0 Vicryl.  The  external ring would easily admit the tip of the little finger.  Subcutaneous  tissue was irrigated.  Scarpa's fascia was closed with 3-0 Vicryl and the  skin was  closed with a running subcuticular suture of 4-0 Monocryl. Benzoin and half  inch  Steri-Strips were used to reinforce the skin closure. A dry sterile  dressing using 4 x 4's, and 4 inch Hypafix was applied. The patient seemed  to tolerate the procedure well and was taken to the PACU in satisfactory  condition.                                               Thomas B. Samuella Cota, M.D.    TBP/MEDQ  D:  04/18/2002  T:  04/18/2002  Job:  [id number]   cc:   [name]. [name], M.D. [facility]   Duke Salvia, M.D. [facility]

## 2010-06-21 NOTE — Discharge Summary (Signed)
Sean [name], Sean [name]              ACCOUNT NO.:  [id number]   MEDICAL RECORD NO.:  [id number]          PATIENT TYPE:  INP   LOCATION:  3743                         FACILITY:  [facility]   PHYSICIAN:  Noralyn Pick. Eden Emms, MD, FACCDATE OF BIRTH:  1931-02-23   DATE OF ADMISSION:  11/12/2005  DATE OF DISCHARGE:  11/17/2005                                 DISCHARGE SUMMARY   ALLERGIES:  PENICILLIN.   PRINCIPAL DIAGNOSES:  1. Admitted with fatigue, weakness, progressive dyspnea, orthopnea.      a.     Finding of right deep venous thrombosis this admission.      b.     Ventilation-perfusion scan demonstrates pulmonary emboli in both       right and left lungs.      c.     Anticoagulated this admission.  2. Symptomatic tachyarrhythmia, most likely nonsustained ventricular      tachycardia this admission.   SECONDARY DIAGNOSES:  1. History of myocardial infarction x2.  2. Ischemic cardiomyopathy.  Ejection fraction 40% by echocardiogram,      November 13, 2005.      a.     Coronary artery disease status post coronary artery bypass graft       surgery in 1991, 1999 redo.      b.     Class II chronic systolic congestive heart failure symptoms.      c.     History of monomorphic ventricular tachycardia status post       implantation of implantable cardioverter defibrillator, June 2003.      d.     Dehiscence of implantable cardioverter defibrillator which was       extracted March 07, 2005, with re-implantation of a Guidant VITALITY       2 VR implantable cardioverter defibrillator, March 17, 2005.  3. Hypertension.  4. Dyslipidemia.  5. Renal Insufficiency.  6. Gastroesophageal reflux disease, history of peptic ulcer disease.  7. Status post cholecystectomy, appendectomy, right thoracotomy to remove      benign nodule.  8. The patient is on the heart failure ACTION Trial.   PROCEDURE:  1. November 13, 2005, venous duplex bilaterally showing right lower      extremity DVT from the distal  femoral to the popliteal vein.  2. October 11, 20007, V/Q scan showing large segmental perfusion defects      in the posterior and lateral portions of the right lower lobe, the      right middle lobe and the right upper lobe.  There is also a segmental      perfusion defect involving the posterior left upper lobe.  3. Transthoracic echocardiogram, November 13, 2005.  This study shows that      there is akinesis of the inferior and posterior walls, mild aneurysmal      dilatation at the base of inferior wall.  The left ventricular ejection      fraction estimated at 40%, mild mitral regurgitation.  Pulmonic and      tricuspid valves are grossly normal.   BRIEF HISTORY:  Sean [name] is a 77 year old male with  a history of  myocardial infarction x2, ischemic cardiomyopathy with ejection fraction of  40% at echocardiogram on November 13, 2005.  He underwent coronary artery  bypass graft surgery in 1991, with redo surgery in 1999.  He presents with  decreased exercise tolerance, profound dyspnea, and chest tightness.  He has  been fighting a viral illness for the last month.  He had fever, productive  cough and sinus drainage.  He has been rather sedentary for the last 2-3  weeks.  The patient also complains of orthopnea symptoms.   PLAN:  Admit the patient to do a D-dimer and to check serial enzymes, to  interrogate his defibrillator, and to provide a 2D echocardiogram as well as  starting subcutaneous Lovenox.   HOSPITAL COURSE:  The patient presented to [facility] on November 12, 2005, complaining of progressive dyspnea.  He ruled out for acute  coronary syndrome.  His troponin #1 studies were 0.05, 0.06, and then 0.02.  He did, however, have an elevated D-dimer.  A V/Q scan was ordered in the  setting of the patient's history of renal insufficiency and his admission  creatinine was 1.8.  This study showed bilateral pulmonary emboli.  In  addition, a venous duplex of the lower  extremities showed the patient had a  right deep venous thrombosis. Patient's Lovenox was changed to IV Heparin  and he was also started on Coumadin for overlapping therapy. The patient's  guidant VITALITY 2 VR was interrogated and showed that there was normal  device function with no episodes and no heart rates greater then 170  beats/min.  The patient has been thoroughly anticoagulated prior to  discharge.  During this hospitalization, he has experienced some chest  tightness which coincides exactly with increased heart rates, nonsustained  ventricular tachycardia versus aberrant conduction of atrial fibrillation.  He does feel palpitations as well as chest tightness.  He says he has never  had these symptoms prior to this hospitalization.  The patient's  discharging, on November 17, 2005, INR was 2.2.  It had been 2.2 for a 48-  hour period, with overlap Heparin was stopped.  Patient discharging Coumadin  5 mg to alternate with 7.5 mg every other day.  He was to follow up with the  [facility]in Clinic and in offices with Dr. [name] in 2 weeks.   MEDICATIONS:  At discharge included  1. Atenolol 25 mg daily.  2. Imdur 1-1/2 tabs daily.  3. Aspirin 81 mg daily.  4. Omeprazole 20 mg daily.  5. Tricor 145 mg daily.  6. Simvastatin 40 mg daily.  7. Hydrochlorothiazide 25 mg daily.  8. Coumadin 5 mg to alternate with 7.5 mg every other day.  9. Potassium chloride 10 mEq daily.   He is asked not to engage in any lifting or significant activity until he  sees Dr. [name] in the office.   LABORATORIES:  This admission:  On discharge, a complete blood count:  Hemoglobin 13.6, hematocrit 39.9, white cells 7.5, and platelets 231.  D-  dimer this admission was 5.18.  Patient's admission creatinine on October 10  was 1.8, creatinine on discharge was 1.7.  Other electrolytes on discharge,  November 17, 2005, sodium 136, potassium 4.4, chloride 101, bicarbonate 27, glucose 107, alkaline phosphatase  this  admission was 38.  SGOT was 28, SGPT was 17.  Lipid panel:  Cholesterol 127, triglycerides 271, HDL cholesterol 28, LDL  cholesterol 45.      Maple Mirza, PA  ______________________________  Noralyn Pick Eden Emms, MD, [facility]    GM/MEDQ  D:  12/03/2005  T:  12/04/2005  Job:  [id number]   cc:   [name]. [name], MD, [facility]  Evelena Peat, M.D.  Duke Salvia, MD, [facility]

## 2010-06-21 NOTE — Cardiovascular Report (Signed)
NAME:  Sean [name], Sean [name] NO.:  [id number]   MEDICAL RECORD NO.:  [id number]                   PATIENT TYPE:  OIB   LOCATION:  2020                                 FACILITY:  [facility]   PHYSICIAN:  [name]. [name], M.D.             DATE OF BIRTH:  07/05/1931   DATE OF PROCEDURE:  07/05/2002  DATE OF DISCHARGE:                              CARDIAC CATHETERIZATION   PROCEDURES:  1. Left heart catheterization.  2. Selective coronary arteriography.  3. Selective left ventriculography.  4. Saphenous vein graft angiography x4.  5. Selective left internal mammary angiography x1.   INDICATIONS:  The patient is a 77 year old gentleman well-known to [place].  He  underwent revascularization surgery in 1991 and also again in 1999.  He has  recently had implantation with an implantable defibrillator due to MADIT-2  criteria.  The current study was done to assess coronary anatomy, as the  patient has some mild increase in his angina.  His angina has improved with  adjustment of his medicines.   DESCRIPTION OF PROCEDURE:  The procedure was performed from the right  femoral artery using 6 French catheters.  There was some hold-up of the wire  in the distal aorta due to calcification; however, we were able to easily  navigate into the central aorta.  There were no complications from the  procedure, and the patient tolerated the procedure without complication, and  he was taken to the holding area in satisfactory clinical condition.   HEMODYNAMIC DATA:  1. Central aorta 128/68, mean 94.  2. Left ventricle 144/17.  3. No aortic or left ventricular gradient on pullback across the aortic     valve.   ANGIOGRAPHIC DATA:  1. Ventriculography was performed in the RAO projection.  Ejection fraction     is pending at the time of this dictation.  The inferior wall appeared to     be akinetic, and there was hypokinesis of the anterolateral wall toward     the apex.  Overall  ejection fraction would be estimated at 30%.  There     did appear to be 1-2+ mitral regurgitation.  2. Left main is somewhat diffusely irregular but without critical narrowing,     and there is probably 40% narrowing of the distal left main-circumflex     intersection.  The LAD then divides into the first septal perforator, and     there is 90% narrowing at the bifurcation of the septal and the LAD     continuation.  The LAD then was occluded after the second septal     perforator.  3. The circumflex proper is totally occluded proximally.  4. The right coronary artery is diffusely diseased proximally and then     totally occluded.  The distal right coronary fills by collaterals both     from the native right as well as from those derived from the OM-2, which  is supplied by a saphenous vein graft.  5. The internal mammary to the diagonal-LAD system remains widely patent.     The distal LAD itself has moderate diffuse luminal irregularity.  6. The saphenous vein graft to the OM-2 is widely patent.  It fills faintly     an OM-1 and also fills by collaterals the distal right coronary     circulation.  7. The saphenous vein grafts to the OM-1, distal LAD, and PDA are all     occluded.   CONCLUSION:  1. Moderate reduction in left ventricular function with concomitant mitral     regurgitation and associated wall motion abnormalities as described     above.  2. Continued patency of the internal mammary artery as described on the     previous study.  3. Continued patency of the saphenous vein graft to the obtuse marginal 2.  4. Occlusion of the saphenous vein grafts to the obtuse marginal 1, distal     left anterior descending, and posterior descending artery as on previous     studies.  5. Severe native three-vessel coronary artery disease.   DISPOSITION:  1. Continued medical therapy would be warranted.  2. We will keep the patient until morning.   ADDENDUM:  Ejection fraction was  calculated at 31.7%.                                                [name]. [name], M.D.    TDS/MEDQ  D:  07/05/2002  T:  07/06/2002  Job:  [id number]   cc:   Cardiovascular Laboratory

## 2010-06-21 NOTE — Discharge Summary (Signed)
Sean [name], Sean [name] NO.:  [id number]   MEDICAL RECORD NO.:  [id number]          PATIENT TYPE:  OIB   LOCATION:  2899                         FACILITY:  [facility]   PHYSICIAN:  Duke Salvia, M.D.  DATE OF BIRTH:  Dec 20, 1931   DATE OF ADMISSION:  06/05/2004  DATE OF DISCHARGE:  06/05/2004                                 DISCHARGE SUMMARY   DISCHARGE DIAGNOSES:  1.  Discharging the day of procedure, status post cardioverted defibrillator      pocket, revision with wound culture.  2.  Finding of subcutaneous hematoma.  3.  Liquid within the pocket suspected to be injected Lidocaine for local      anesthesia.  4.  This cloudy fluid expressed and sent for culture.   SECONDARY DIAGNOSES:  1.  History of ischemic cardiomyopathy status post guidant Prizm      cardioverted defibrillator implanted October 2003 for inducible      monomorphic ventricular tachycardia.  2.  Severe 3-vessel coronary artery disease with history of myocardial      infarction x2, subsequent coronary artery bypass graft surgery in 1991      with re-do in 1998.  3.  Hypertension.  4.  Dyslipidemia.  5.  Gastroesophageal reflux disease/peptic ulcer disease.   PROCEDURES:  Revision of cardioverted defibrillator pocket with lysis of  adhesions and culture of fluid, Dr. Hurman Horn.   DISCHARGE DISPOSITION:  The patient discharged same day after revision of  ICD pocket with finding of subcutaneous hematoma.  The patient post  procedure had done well.  He is in no acute distress.  His pain is  controlled with Tylenol.  He is alert and oriented x3, eating without nausea  or vomiting.  The incision empty and ICD site is not inspected but the  tissue surrounding do not look swollen or ecchymotic.  The patient is  afebrile.   DISCHARGE MEDICATIONS:  1.  Altace 2.5 mg daily.  2.  Enteric coated aspirin, 81 mg daily.  3.  Hydrochlorothiazide 12.5 mg daily.  4.  Zocor 40 mg daily.  5.  Atenolol  50 mg daily.  6.  Isosorbide 60 mg daily.  7.  Nexium 40 mg daily.  8.  Tricor 145 mg daily.   FOLLOW UP:  The patient will have followup at the [facility] to assess the  incision made today and also to discuss with Dr. Graciela Husbands the results of wound  culture.  The bandage may be removed on Thursday, May 4.  He is asked to  keep his incision dry for the next 7 days and sponge until Wednesday, May  10.  His discharge diet is low sodium, low cholesterol diet.   BRIEF HISTORY:  Sean [name] is a 77 year old male who had an ICD implanted  October 2003 for ischemic cardiomyopathy and inducible monomorphic  ventricular tachycardia.  He has history of severe 3-vessel coronary artery  disease status post coronary artery bypass graft surgery with a re-do most  recently in 1998.  The patient has had no ICD therapies, no pre syncope or  syncope.  About  3 weeks ago, the patient noted change in the character of  his cardioverted defibrillator site.  The device in its subsequent position  has become more prominent, with tissue decoloration at the central line  aspect.  The patient says it has become more sensitive in the last 3 weeks  as well.  He presented to the [facility] cardiology office and was seen by  personnel there and also by Dr. Graciela Husbands who recommended outpatient surgical  admission for ICD pocket investigation and revision and possible culture.   HOSPITAL COURSE:  The patient presented electively on May 3.  He underwent  successful pocket revision and wound culture by Dr. Graciela Husbands.  He is awaiting  the results of the studies and he will have 2-week followup at [facility] heart  care.   SUMMARY:      GM/MEDQ  D:  06/05/2004  T:  06/05/2004  Job:  [id number]   cc:   [name]. [name], M.D. [facility]   Teena Irani. Arlyce Dice, M.D.  [address]  [place]  [place] [address]  Fax: [phone]

## 2010-06-21 NOTE — Assessment & Plan Note (Signed)
[facility]                            CARDIOLOGY OFFICE NOTE   Sean [name], Sean [name]                       MRN:          [id number]  DATE:05/26/2006                            DOB:          10/27/1931    Sean [name] is in for a follow up visit.  Cardiac-wise he is stable.  He  has not been having any ongoing chest pains; he is still working at the  [employer].  He has not had any swelling in the lower extremities.  He  was placed on Flomax, because of the prostate issue.  He is scheduled to  see the nephrologist back in approximately 6 month.   PHYSICAL EXAMINATION:  Today the blood pressure is 136/64.  The pulse is  58.  Lung fields are clear.  There is an apical systolic murmur, compatible with mitral  regurgitation.   IMPRESSION:  1. Coronary artery disease with prior coronary artery bypass graft      surgery.  2. Old myocardial infarction.  3. Hypertension.  4. Modest elevation of serum creatinine followed by nephrology.  5. History of mixed hyperlipidemia.  6. History of pulmonary embolism treated with Coumadin      anticoagulation.  7. History of mitral regurgitation.  8. History of prior implantation of a Guidant Vitality II VR      implantable cardio defibrillator with dehiscence.  9. History of monomorphic ventricular tachycardia.   PLAN:  1. Continue current medical regimen.  2. Continue Coumadin.  3. Basic metabolic profile.  4. Return to clinic in three months.     Sean [name]. Sean Kill, MD, Sean [name]  Electronically Signed    TDS/MedQ  DD: 05/26/2006  DT: 05/26/2006  Job #: [id number]

## 2010-06-21 NOTE — Consult Note (Signed)
NAME:  Sean [name], Sean [name]                        ACCOUNT NO.:  [id number]   MEDICAL RECORD NO.:  [id number]                   PATIENT TYPE:  INP   LOCATION:  1828                                 FACILITY:  [facility]   PHYSICIAN:  Lennis P. Darrold Span, M.D.             DATE OF BIRTH:  Oct 09, 1931   DATE OF CONSULTATION:  03/17/2003  DATE OF DISCHARGE:                                   CONSULTATION   REASON FOR CONSULTATION:  The patient is a 77 year old white male seen at  the request of Dr. Myrtis Ser as he is being admitted from the [facility]  emergency room with unstable angina; consult for elevated prothrombin time  of unclear etiology.  Primary care is by Dr. Dara Hoyer and he is followed  by Dr. [name] for coronary artery disease, history of ventricular  tachycardia, and ejection fraction of about 35%.  Pain has resolved with  interventions in the emergency room where he has been since early this  morning.  He will likely go to repeat catheterization.  He has no history of  bleeding or clotting disorders and only anticoagulant has been aspirin.  He  has had no known bleeding prior to admission.  He does not use alcohol and  has an excellent diet.  Last surgical procedure was inguinal hernia repair  in spring 2004.  No bleeding problems then.  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  Labs today:  White count 5.6, ANC of 3.9, hemoglobin 15.3, platelets 137,  sodium 136, potassium 3.3, chloride 106, CO2 26, glucose 215, BUN 14,  creatinine 1.5, total bili 1.6, alk phos 78, GOT 43, GPT 37, total protein  6.7, albumin 3.6.  PT initially 20.9, INR of 2.3, and a repeat 23.4, INR of  2.9 - these drawn from the antecubital.  PTT was ordered though I do not see  a result.  Home medications are niacin, Altace, aspirin, isosorbide,  hydrochlorothiazide, atenolol, and  Prilosec.  Blood pressures by EMS from  0800 to 0900:  116/90 and 104/90.  In the emergency room on nitroglycerin  his blood pressure dropped very briefly into the 70s systolic about 10 a.m.  The first PT had been drawn at [phone] and a second at 1047.   PHYSICAL EXAMINATION:  GENERAL:  He is a very pleasant gentleman, looks his  stated age; awake, alert, and comfortable supine on the stretcher with his  wife here.  Denies present pain or shortness of breath.  Blood pressure  103/55, heart rate 63, respirations 18.  He appears well nourished.  He has  no ecchymoses other than small areas at the venipuncture sites with no  active bleeding there or around the IVs.  LUNGS:  Clear.  ABDOMEN:  Soft and nontender without appreciable organomegaly.  LOWER EXTREMITIES:  No edema.  SKIN:  Negative.   IMPRESSION AND RECOMMENDATIONS:  Some prolongation of the PT not related to  known Coumadin and with minimal liver function test changes to this point.  I wonder if it could be from some poor hepatic perfusion  earlier.  Will check lupus anticoagulant - which I doubt, without any prior  history of bleeding or clotting problems - and a DIC panel.  Would be  reasonable to give vitamin K from my standpoint and I will confirm this with  Dr. Myrtis Ser.  See orders.                                               Lennis P. Darrold Span, M.D.    LPL/MEDQ  D:  03/17/2003  T:  03/18/2003  Job:  [id number]   cc:   [name]. [name], M.D.   Teena Irani. Arlyce Dice, M.D.  [address]  [place]  [place] [address]  Fax: [phone]

## 2010-06-21 NOTE — Op Note (Signed)
NAMEKENAI, FLUEGEL NO.:  [id number]   MEDICAL RECORD NO.:  [id number]          PATIENT TYPE:  INP   LOCATION:  2011                         FACILITY:  [facility]   PHYSICIAN:  Doylene Canning. Ladona Ridgel, M.D.  DATE OF BIRTH:  [birth year]   DATE OF PROCEDURE:  03/17/2005  DATE OF DISCHARGE:                                 OPERATIVE REPORT   PROCEDURE PERFORMED:  ICD implantation.   INDICATIONS:  Ischemic cardiomyopathy with inducible VT at EP testing,  status post previously implanted ICD which was removed secondary to pocket  infection.   INTRODUCTION:  The patient is a 77 year old male with an ischemic  cardiomyopathy and inducible VT at EP testing, who underwent ICD  implantation approximately in 2003. A year ago he was found to have evidence  that his pocket might have infection with retraction of the pocket for which  he underwent pocket revision. Cultures were initially negative.  Approximately 9 months after this the patient developed dehiscence of the  pocket and erosion of the defibrillator through the skin. For this he  underwent lead extraction  10 days ago. He had been treated with  antibiotics. He is now referred for implantation of a new device on the  contralateral (right) side.   PROCEDURE:  After informed consent was obtained, the patient is taken  diagnostic EP lab in fasting state. After usual preparation and draping,  intravenous fentanyl Midazolam was given for sedation. 30 mL lidocaine was  infiltrated into the right infraclavicular region. A 8 cm incision was  carried out over this region. Electrocautery utilized to dissect down to the  fascial plane. 10 mL of contrast was injected into the right upper extremity  venous system demonstrating a patent right subclavian vein.  Subsequently  punctured and the Guidant model 0157 - [id number] active fixation defibrillation  lead was advanced into the right ventricle. The lead was placed in the RV  septum where  R-waves measured 8 mV through the analyzer and pacing impedance  with the lead actively fixed was 601 ohms. The pacing threshold 0.5 volts of  0.5 milliseconds. 10 volts pacing this location did not stimulate the  diaphragm. There was satisfactory injury current present. These satisfactory  parameters, lead was secured to subpectoralis fascia with a figure-of-eight  silk suture. Sewing sleeve was also secured with silk suture. Electrocautery  utilized then to make a subcutaneous pocket. Kanamycin irrigation was  utilized to irrigate the pocket. Electrocautery utilized to assure  hemostasis. The Guidant Vitality 2 ICD model T 175 serial number [id number] was  connected to the defibrillation lead and placed in the subcutaneous pocket.  Generator secured with silk suture. Additional kanamycin was utilized to  irrigate the pocket and defibrillation threshold testing carried out.   After the patient was more deeply sedated with fentanyl and Versed, VF was  induced with T-wave shock and the initial 14 joules shock was delivered  which failed to terminate VF. A subsequent 21 joules shock was then  delivered terminating ventricular fibrillation restoring sinus rhythm. 5  minutes was allowed to elapse and second DFT test carried out.  Again VF was  induced with T-wave shock and this time a 17 joule shock was delivered which  again failed to terminate VF. A subsequent 23 joule shock was delivered  which terminated VF and restored sinus rhythm. At this point no additional  defibrillation threshold testing was carried out and the incision was closed  with layer of 2-0 Vicryl followed by layer of 3-0 Vicryl followed by a layer  of 4-0 Vicryl. Benzoin was painted on the skin, Steri-Strips applied and a  pressure dressing was placed. The patient was returned to his room in  satisfactory condition.   COMPLICATIONS:  There were no immediate procedure complications.   RESULTS:  Successful implantation of a  Guidant single chamber defibrillator  a patient with ischemic cardiomyopathy and inducible VT status post previous  removal from the contralateral side of a prior implant.           ______________________________  Doylene Canning. Ladona Ridgel, M.D.     GWT/MEDQ  D:  03/17/2005  T:  03/17/2005  Job:  [id number]   cc:   Teena Irani. Arlyce Dice, M.D.  Fax: [phone]   Duke Salvia, M.D.  [phone] [address]  [address]  [place]  [place] [address]   [name]. [name], M.D. [facility]  [address]  [address]  [place]  [place] [address]

## 2010-06-21 NOTE — Discharge Summary (Signed)
Sean [name], BASTYR NO.:  [id number]   MEDICAL RECORD NO.:  [id number]          PATIENT TYPE:  INP   LOCATION:  2011                         FACILITY:  [facility]   PHYSICIAN:  Maple Mirza, P.A. DATE OF BIRTH:  [birth year]   DATE OF ADMISSION:  03/06/2005  DATE OF DISCHARGE:  03/18/2005                                 DISCHARGE SUMMARY   ALLERGIES:  1.  PENICILLIN.  2.  CLOTH TAPE.  3.  VANCOMYCIN, which causes the red man syndrome.   PRINCIPAL DIAGNOSES:  1.  Admitted with implantable cardioverter defibrillator pocket infection.  2.  Explantation of implantable cardioverter defibrillator generator and      leads March 07, 2005, with wound cultures negative for Staphylococcus      aureus.  3.  Infectious disease consult March 11, 2005.  Patient started on Zyvox      and Cipro.  Overall, the patient has been on Zyvox 8 days at discharge.  4.  Implantation of cardioverter defibrillator March 17, 2005 on the      right side.  A Guidant Vitality 2, model T175VR device was implanted by      Dr. Lewayne Bunting.   PROCEDURES:  1.  On March 07, 2005, explantation of ICG generator and leads, Dr. Lewayne Bunting.  2.  On March 17, 2005, implantation of Guidant Vitality cardioverter      defibrillator, model T175VR, Dr. Lewayne Bunting.   BRIEF HISTORY:  Sean [name] is a 77 year old male.  He has a history of  myocardial infarction x2.  The first was in 1991, and he had his first  coronary artery bypass graft surgery at that time.  The second was in 1999,  followed by redo surgery.  He had a catheterization in December 2006, and  this showed that native disease was extensive.  The left main had a 30%  stenosis.  The LAD was 100% occluded after first septal, left circumflex  100% proximally, and the right coronary artery 100% proximally.  He had  chronic occlusions to the SVG to the PDA, SVG to the OM, and the SVG to the  diagonal.  The SVG to the second  obtuse marginal was patent, as was the  sequential LIMA to the LAD and then to the first diagonal.  There has been  no change since catheterization 2002 with that study.   The patient had ICD implanted October 2003 for inducible sustained  monomorphic ventricular tachycardia in a setting of ischemic cardiomyopathy  and a decreased left ventricular function which was about 30%.   The patient says that his ICD pocket has always been protuberant, and the  device was revised for that reason on Jun 05, 2004.  The patient has been  followed closely since then.  There has been thought that eventually the  device/pocket relationship might fail.  The patient noted purulent discharge  from the caudal aspect of the pocket yesterday, that would be March 05, 2005, after taking a shower.  He also noted evidence of the discharge on his  undershirt that he  had worn yesterday.  He presented to the office of  [facility].  He was seen by Dr. Antoine Poche there, transferred  immediately to [facility], started on IV vancomycin.  Dr. Ladona Ridgel  will undertake device extraction/lead extraction in the emergency room with  CVTS backup March 07, 2005.   HOSPITAL COURSE:  The patient presented with erosion and purulent dehiscence  of ICD pocket on March 06, 2005.  He underwent generator extraction and  lead extraction on March 07, 2005 by Dr. Lewayne Bunting.  Procedure was  successful.  The patient's incision was closed with vertical mattress  sutures, and there has been no purulent discharge since March 07, 2005.  There is mild erythema which indicates some suture reaction with the skin.  An ID consult was obtained March 11, 2005, and the patient was started on  IV vancomycin.  However, he had red man reaction to that, and the  antibiotic was switched to Zyvox/Cipro.  He had been on it for 5 days when  he developed severe epigastric burning.  The antibiotic was discontinued,  and he was  restarted on vancomycin, this time with pretreatment with  Benadryl.  He still had problems with itching, and so the vancomycin was  discontinued.  His Nexium, which was 40 mg daily, was increased to 40 mg  twice daily, and Zyvox was restarted.  At the time of discharge, he has been  on Zyvox for 8 days.  The patient underwent implantation of a Guidant  Vitality cardioverter defibrillator on the right side by Dr. Lewayne Bunting on  March 17, 2005 and is ready for discharge on March 18, 2005.  He has  had no smoldering fevers during this admission, and his incision on the  right is healing nicely without hematoma.  The incision on the left where  the device was extracted is also healing nicely.  Sutures were also removed  during the procedure on March 17, 2005 from the original ICD pocket where  explantation had taken place.  The patient has followup with Dr. Graciela Husbands  Wednesday March 26, 2005 at 2:45 for suture removal.  He will then  present to the ICD clinic Wednesday April 02, 2005 at 9:45, and he will  see Dr. Graciela Husbands in May 2007.  Dr. Odessa Fleming office will call with that  appointment.  He is asked not to drive for the next week.  He is asked to  keep his incision dry for the next 7 days, to sponge bathe until Monday  March 24, 2005.   MEDICATIONS AT DISCHARGE:  1.  Linezolid 600 mg twice daily for the next 7 days.  2.  Enteric coated aspirin 81 mg daily.  3.  Atenolol 50 mg daily.  4.  Nexium 40 mg twice daily.  This is a new dose.  5.  Isosorbide mononitrate 60 mg daily.  6.  TriCor 145 mg daily.  7.  Altace 2.5 mg daily.  8.  Zocor 40 mg daily at bedtime.  9.  Hydrochlorothiazide 12.5 mg daily.   LABORATORY STUDIES:  Complete blood count March 17, 2005:  Hemoglobin 14,  hematocrit 40, white cells 8.1, platelets 185.  Serum electrolytes March 17, 2005:  Sodium 137, potassium 3.7, chloride 102, carbonate 28, BUN 14, creatinine 1.3, glucose 105.  PT/INR:  PT 14.4,  INR 1.1 on March 17, 2005.  Calcium is 9.1.      Maple Mirza, P.A.     GM/MEDQ  D:  03/18/2005  T:  03/19/2005  Job:  [id number]   cc:   [name]. [name], M.D. [facility]  [address]  [address]  [place]  [place] [address]   Duke Salvia, M.D.  [address]  [address]  [place]  [place] [address]   Teena Irani. Arlyce Dice, M.D.  Fax: [phone]

## 2010-06-21 NOTE — Discharge Summary (Signed)
NAMEDECODA, VAN NO.:  [id number]   MEDICAL RECORD NO.:  [id number]          PATIENT TYPE:  INP   LOCATION:  3701                         FACILITY:  [facility]   PHYSICIAN:  [name]. [name], M.D. [facility] OF BIRTH:  20-Sep-1931   DATE OF ADMISSION:  01/13/2005  DATE OF DISCHARGE:  01/15/2005                                 DISCHARGE SUMMARY   PROCEDURES:  1.  Cardiac catheterization.  2.  Coronary arteriogram.  3.  Left ventriculogram.   PRIMARY DIAGNOSES:  1.  Chest pain, medical therapy for coronary artery disease recommended at      catheterization.  2.  Hyperlipidemia.  3.  Family history of coronary artery disease.  4.  History of exertional angina.  5.  History of congestive heart failure.  6.  History of ventricular tachycardia, status post Guidant implantable      cardioverter-defibrillator.  7.  Gastroesophageal reflux disease/peptic ulcer disease.  8.  Status post bypass surgery, appendectomy, cholecystectomy, lung surgery      and hernia repair x2.  9.  Allergy to PENICILLIN and TAPE.   HOSPITAL COURSE:  Mr. Mulford is a 77 year old male with known coronary  artery disease.  He had bypass surgery in 1991 and 1999.  He came to the  hospital with chest pain and was admitted for further evaluation and  treatment.   He continued to have pain and he was taken to the catheterization lab on  January 14, 2005.  The cardiac catheterization showed a totaled LAD,  totaled circumflex and totaled RCA.  He had right to right collaterals and  left to right collaterals.  The SVG to OM-2 was normal, the LIMA was normal,  and there was diffuse disease.  The distal LAD was okay.  He had a 40-50%  stenosis in the left subclavian, but there was no gradient.  Dr. Eden Emms  evaluated the films and felt that medical therapy was his best option.   On January 15, 2005, Mr. Alarid was seen by Dr. [name].  His chest x-ray  had shown COPD with scarring but no active  disease.  It was felt that no  further inpatient workup was indicated and he was considered stable for  discharge with outpatient follow-up arranged.   DISCHARGE INSTRUCTIONS:  1.  His activity level is to be increased gradually.  2.  He is to stick to a low-fat diet.  3.  He is to follow up with Dr. [name] at 2 o'clock on December 16 and with      his primary care physician as needed or as scheduled.   DISCHARGE MEDICATIONS:  1.  Aspirin 81 mg a day.  2.  Atenolol 50 mg a day.  3.  Imdur 60 mg a day.  4.  Nexium as prior to admission.  5.  Tricor daily.  6.  Zocor 40 mg a day.  7.  Altace and hydrochlorothiazide as prior to admission.  8.  Nitroglycerin sublingual p.r.n.      Theodore Demark, P.A. [facility]      Thomas D. [name], M.D. [facility]  Electronically Signed  RB/MEDQ  D:  04/07/2005  T:  04/08/2005  Job:  [id number]   cc:   Dr. Arlyce Dice

## 2010-06-21 NOTE — Op Note (Signed)
NAME:  Sean [name], Sean [name] NO.:  [id number]   MEDICAL RECORD NO.:  [id number]                   PATIENT TYPE:  OIB   LOCATION:  2899                                 FACILITY:  [facility]   PHYSICIAN:  Duke Salvia, M.D. [facility]           DATE OF BIRTH:  28-Jun-1931   DATE OF PROCEDURE:  11/29/2001  DATE OF DISCHARGE:                                 OPERATIVE REPORT   PREOPERATIVE DIAGNOSIS:  Inducible sustained monomorphic ventricular  tachycardia.   POSTOPERATIVE DIAGNOSIS:  Inducible sustained monomorphic ventricular  tachycardia.   PROCEDURE:  Implantable cardioverter-defibrillator implantation with  intraoperative defibrillation threshold testing.   DESCRIPTION OF PROCEDURE:  Following the aforementioned results of inducible  sustained monomorphic VT with nonsustained VT in the setting of ischemic  cardiomyopathy and depressed left ventricular function, the patient was then  prepared for ICD implantation.  After the routine prep and drape of the left  upper chest, intravenous contrast was injected via the left antecubital vein  to identify the course and the patency of the extrathoracic left subclavian  vein.  This having been accomplished, lidocaine was infiltrated in the  prepectoral subclavicular region.  An incision was made and carried down to  the layer of the prepectoral fascia using electrocautery and sharp  dissection.  A pocket was formed similarly.   Thereafter, attention was turned to gaining access to the extrathoracic left  subclavian vein, which was accomplished without difficulty without the  aspiration of air or puncture of the artery.  A single venipuncture was  accomplished, a guidewire was placed and retained, and a 0 silk suture was  placed in a figure-of-eight fashion and allowed to hang loosely.   Subsequently a 9 French tear-away introducer sheath was placed, through  which was then passed a Guidant model 0158 ICD lead, serial  number [id number].  Under fluoroscopic guidance it was manipulated to the right ventricular  apex, where the bipolar R-wave was 10 millivolts with a pacing threshold of  0.6 volts at 0.5 msec, impedance was 500 Ohms.  There was no diaphragmatic  pacing at 10 volts.   With these acceptable parameters recorded, the lead was then attached to a  Guidant Prizm 1860 ICD, serial number [id number].  Through this device the  bipolar R-wave was 10-11 millivolts with a pacing impedance of 522 Ohms, a  pacing threshold of 0.6 volts at 1.5 msec.  The high voltage impedance was  36 Ohms.   With these acceptable parameters recorded, defibrillation threshold testing  was undertaken.  The pocket was copiously irrigated with antibiotic-  containing saline solution, hemostasis was obtained, and the lead and pulse  generator were then placed in the pocket and secured to the prepectoral  fascia. Ventricular fibrillation was induced via the T-wave shock.  Following a total duration of 8-1/2 seconds, a 14-joule shock was delivered  through a measured resistance of 36 Ohms, terminating ventricular  fibrillation and  restoring sinus rhythm.  This was accomplished at [facility].  After a wait of five to six minutes,ventricular fibrillation  was reinduced via the T-wave shock.  After a total duration of nine seconds,  a 14-joule shock was delivered through a measured resistance of 36 Ohms,  terminating ventricular fibrillation and restoring sinus rhythm.  This was  accomplished at [facility].   With these acceptable parameters recorded, the system was implanted.  The  wound was closed in three layers in the normal fashion.  Because of the  patient's tape allergy, a superficial Dermabond adhesive was placed.   Needle counts, sponge counts, and instrument counts were correct at the end  of the procedure according to the staff.   The patient's device is programmed at 160/200/240.                                                Duke Salvia, M.D. [facility]    SCK/MEDQ  D:  11/29/2001  T:  11/29/2001  Job:  [id number]   cc:   Electrophysiology Laboratory   [place]   Teena Irani. Arlyce Dice, M.D.  [address]  [place]  [place] [address]  Fax: [phone]

## 2010-06-21 NOTE — Assessment & Plan Note (Signed)
[facility]                              CARDIOLOGY OFFICE NOTE   Romin, Divita AJDIN MACKE                     MRN:          [id number]  DATE:10/17/2005                            DOB:          [birth year]    [name] is in for followup.  He is stable, he is feeling better.  He has  not been having any ongoing chest pain.   PHYSICAL EXAMINATION:  The blood pressure is 106/60, the pulse is 59, the  weight is 216 pounds.  He has a weight chart with him that shows weights  varying from 212 to 216, and they have been very stable over a month's time.  There is no extremity edema.  There is a systolic ejection murmur that is 1-2/6, no diastolic murmurs are  appreciated.  ABDOMEN:  Soft.  The pacer sites are looking healed after a long period of  recurrent inflammatory change.   His EKG today reveals P waves with QRSs.  The PR interval is varying, but is  quite long.  Previous EKG has demonstrated first-degree A-V block, but this  is more substantial than that.  There is not group beating, so it does not  appear necessarily to be a Wenckebach, and could represent a junctional  escape at 59, although that is somewhat fast for this.   The patient has backup pacing at 40, and he has a single-chamber  defibrillator.  I am going to go ahead and decrease his atenolol from 50 mg  to 25 mg, we will bring him back in a week to reassess and get another EKG  to see if this is resolved.  He may eventually need A-V pacing, but it would  be nice to have A-V synchrony without pacing.  This is particularly in light  of his previous problems with his device implant.  I will review his strips  with the electrophysiologists.                              [name]. [name], MD, [facility]    TDS/MedQ  DD:  10/17/2005  DT:  10/18/2005  Job #:  [id number]

## 2010-06-21 NOTE — Discharge Summary (Signed)
NAME:  Sean [name], Sean [name] NO.:  [id number]   MEDICAL RECORD NO.:  [id number]                   PATIENT TYPE:  INP   LOCATION:  3701                                 FACILITY:  [facility]   PHYSICIAN:  Willa Rough, M.D.                  DATE OF BIRTH:  08-07-1931   DATE OF ADMISSION:  03/17/2003  DATE OF DISCHARGE:  03/22/2003                                 DISCHARGE SUMMARY   DISCHARGE DIAGNOSES:  1. Admitted with unstable angina, patient with prior history of coronary     artery disease and re-do coronary artery bypass graft.     A. Rule out myocardial infarction, by enzymes negative times three.     B. Adenosine Cardiolite study on March 22, 2003.  No evidence of        ischemia, ejection fraction 41%.  2. Prolonged protime on this admission, possibly secondary to poor hepatic     perfusion, or secondary to Niacin therapy, which has been stopped.   SECONDARY DIAGNOSES:  1. History of myocardial infarction times two, with findings of severe three-     vessel coronary artery disease with subsequent coronary artery bypass     graft surgery, with re-do bypass surgery.  At last catheterization the     LIMA to the LAD is patent.  The reverse saphenous vein graft to the PDA     is patent.  The reverse saphenous vein graft to the obtuse marginal is     patent.  The reverse saphenous vein graft to the circumflex is okay.  The     reverse saphenous vein graft to the LAD is total.  2. Catheterization in June 2004, shows ejection fraction of 32%, and     decision was made to treat medically.  3. Hypertension.  4. Dyslipidemia.  5. Osteoarthritis.  6. GERD/PUD.  7. Psoriasis.  8. Admitted with renal insufficiency with a creatinine of 1.7, improving     during this hospitalization.   PROCEDURES:  1. Initial plans for a left heart catheterization, placed on hold since the     patient presented with elevated INR of 2.9 and elevated creatinine 1.7.     These  values have improved.  However, the patient underwent, instead, on     March 22, 2003, Adenosine Cardiolite study, which was negative for     ischemia and negative for infarct, ejection fraction of 41%.  2. Computer tomogram of the chest, negative for pulmonary embolism.   DISCHARGE DISPOSITION:  The patient has remained chest-pain-free through the  five days of this hospitalization.  The patient is ready for discharge on  March 22, 2003, after undergoing Adenosine Cardiolite study, which was  negative.  He has had no significant dysrhythmias, no respiratory  compromise.  The patient goes home with his following medications:   1. Imdur 60 mg daily.  2. Atenolol 50 mg daily.  3.  Prilosec one daily.  4. Aspirin one daily.  5. Nitroglycerin as needed.   DISCHARGE ACTIVITIES:  No driving.  No heavy lifting for one week.  His  usual diet is going to be a low-sodium, low-cholesterol diet.  He has a  follow up appointment on April 05, 2003, at 9:30 in the morning with Dr.  Riley [name] at [facility], [facility] office.   BRIEF HISTORY:  Sean [name] is a 77 year old male with prior history of  coronary artery disease.  He is status post coronary artery bypass graft  surgery with re-do.  He had substernal chest pain on the morning of March 17, 2003, which was 5/10 at the worst, it radiated to the left arm.  He was  also short of breath.  He did have some nausea and vomiting and diaphoresis  and dizziness.  He visited Dr. Arlyce [name], his primary caregiver, and was given  three baby aspirin and two nitroglycerin.  He had decrease in arm pain, but  not in the chest pain.  He was transferred by EMS to the emergency room at  [facility], given an additional nitroglycerin en route, and  presents with a 4/10 chest pain describes as pressure, no arm pain at  present, no shortness of breath.  This is consistent with his usual anginal  pains.  He has had stable angina, but this is  well-controlled and resolves  with rest generally.  He has not used any nitroglycerin for greater than one  month.  The patient will be admitted.  The enzymes will be cycled.  He  probably needs catheterization.  Laboratories will be taken.  Pulmonary  embolism unlikely, but this will be checked.  He will be placed on  nitroglycerin and heparin and given Morphine for increase of pain.  If pain  unable to be controlled he will undergo an urgent catheterization.   HOSPITAL COURSE:  After arriving at [facility] through the  emergency room with unstable angina, the patient was ruled out for  myocardial infarction by serial troponin-I studies, which were negative  times three.  He also was found to have an elevated INR, although he is not  taking any anticoagulation medications.  In addition, his creatinine is 1.7.  These two abnormal lab studies caused heparin to be discontinued and also  for him to not undergo catheterization in an urgent fashion.  His chest pain  actually did resolve and did not reappear during this hospitalization.  He  underwent Adenosine Cardiolite study on March 22, 2003, this was  negative, and he went home on the same day, with follow up as dictated  above.      Maple Mirza, P.A.                    Willa Rough, M.D.    GM/MEDQ  D:  04/21/2003  T:  04/24/2003  Job:  [id number]   cc:   Sean [name], M.D. [facility]   Sean [name], M.D.  [address]  [place]  [place] [address]  Fax: [phone]

## 2010-06-21 NOTE — Assessment & Plan Note (Signed)
[facility]                            CARDIOLOGY OFFICE NOTE   Sean, [name] Sean [name]                     MRN:          [id number]  DATE:02/16/2006                            DOB:          1931-04-03    Sean [name] is in for followup.  He is stable.  He has not been having  any chest pain or shortness of breath.  He feels well, actually.  He has  seen Dr. Retta Diones as well as Dr. Caryn Section.  His last creatinine was 1.8.  He  denies any ongoing symptoms.  He has been placed on Flomax for about a  month. Apparently, by exam he had a large prostate.   His last creatinine in our office was 1.9 approximately 3 months ago.   PHYSICAL EXAMINATION:  VITAL SIGNS:  On exam today, the blood pressure  was 154/77, the pulse was 68, the weight is 217 pounds.  CHEST:  There is a prior right thoracotomy scar.  The lung fields  actually are pretty clear, without any obvious rales.  The pacer site is  satisfactory.  CARDIOVASCULAR:  The cardiac rhythm is regular, with an S4 gallop and an  apical murmur.   The electrocardiogram demonstrates normal sinus rhythm, with first-  degree AV block.  There is left ventricular hypertrophy, with QRS  widening.   IMPRESSION:  1. Coronary artery disease, with prior coronary artery bypass graft      surgery.  2. Hypertension.  3. Elevated creatinine, recent.  4. Mixed hyperlipidemia.   PLAN:  1. Return to clinic in 2 months.  2. Basic metabolic profile.  3. Continue current medical regimen.     [name]. [name], MD, [facility]  Electronically Signed    TDS/MedQ  DD: 02/16/2006  DT: 02/17/2006  Job #: [id number]

## 2010-06-21 NOTE — Cardiovascular Report (Signed)
Sean [name], RHINE NO.:  [id number]   MEDICAL RECORD NO.:  [id number]          PATIENT TYPE:  INP   LOCATION:  3701                         FACILITY:  [facility]   PHYSICIAN:  Charlton Haws, M.D.     DATE OF BIRTH:  May 10, 1931   DATE OF PROCEDURE:  01/14/2005  DATE OF DISCHARGE:                              CARDIAC CATHETERIZATION   Coronary arteriography.   INDICATIONS:  Status post CABG x2 recurrent angina. The patient has known  ischemic cardiomyopathy with an EF of 30%.   Cine catheterization was done with 6-French catheters in the right femoral  artery.   I have reviewed his previous films. 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 We therefore used an AL-1 catheter to engage this.   We used a LIMA catheter to engage the ostium of the left internal mammary  artery as previously seen during Dr. Rosalyn Charters case. It was impossible to  selectively engage the ostium but the artery was able to be filled  diagnostically.   The left main coronary artery had a 30% discrete stenosis. Left anterior  descending artery was 100% occluded after the first septal perforator. The  septal perforators were prominent and probably failed marginally to the  right coronary artery.   Circumflex coronary was 100% occluded proximally.   The right coronary artery as indicated had somewhat of a high leftward  takeoff. There was an 80% ostial stenosis. There was 100% occlusion in the  proximal vessel with faint right to right collaterals.   The patient had known occluded grafts to the PDA, OM-1 and diagonal. His  saphenous vein graft to the second obtuse marginal branch was widely patent.  The left internal mammary artery is widely patent to the diagonal and distal  LAD. During the cath in June 2004, there was diffuse mid and distal LAD  disease in a 50-60% range.   This was small runoff  vessel and probably not amenable to percutaneous  intervention particularly since guide support would be difficult without  selective engagement.   The patient did have a 40-50% left subclavian stenosis. However, there was  no pullback gradient.   Aortic pressure is 134/61, LV pressure was 150/20.   IMPRESSION:  Although the patient has some diffuse distal left anterior  descending artery disease, it is unchanged from his previous catheterization  in June 2004. Undoubtedly he may continue to get angina given his multiply  occluded grafts; however, currently there is no significant indication for  recurrent percutaneous intervention. Continued medical therapy is warranted.   He will be discharged in the morning so long as his leg heals well.           ______________________________  Charlton Haws, M.D.     PN/MEDQ  D:  01/14/2005  T:  01/15/2005  Job:  [id number]

## 2010-06-21 NOTE — H&P (Signed)
NAMEJAVION, Sean [name] NO.:  [id number]   MEDICAL RECORD NO.:  [id number]          PATIENT TYPE:  INP   LOCATION:  3743                         FACILITY:  [facility]   PHYSICIAN:  [name]. [name], MD, FACCDATE OF BIRTH:  Jan 11, 1932   DATE OF ADMISSION:  11/12/2005  DATE OF DISCHARGE:                                HISTORY & PHYSICAL   BRIEF HISTORY:  [name] is a 77 year old white male who was transported  via EMS to [facility] emergency room secondary to shortness of breath.   [name] describes at least a 6-month history for a viral illness followed  by a sinus infection that has been treated by his primary care physician.  He states that he has 3 days left on his antibiotics.  He described his  symptoms as generalized aches and pain, sinus drainage, cough with green  production, a temperature last week of 102, various body aches and  generalized weakness.  He thinks from overall heart standpoint he has been  doing well since seeing Dr. [name] on October 17, 2005 when some  medications were adjusted. His wife elaborates that he has been having blood  work checked every week to 10 days in regards to his kidney function.  However yesterday when trying to do his usual one mile walk with his dogs,  he found that he was unable to do so secondary to shortness of breath and  generalized weakness. His wife states that he did not think he would be able  to make it back to the house.  Upon returning he did rest for the remainder  of the day with improvement of his shortness of breath. However, he was  awakened at approximately 1:00 a.m. to use rest room and found himself to be  short of breath.  He could not return to sleep, he could not lay flat, thus  his wife called EMS for further evaluation. In the emergency room he is  resting comfortably. He denies any actual chest discomfort.  However, he  does state that several weeks ago when he had the generalized body  aches,  particularly in his chest, he did take some nitroglycerin which did not help  his body aches.  He denies any PND, edema or weight changes, or specific  dietary indiscretion.   ALLERGIES:  PENICILLIN.   MEDICATIONS PRIOR TO ADMISSION:  1. Doxycycline 100 mg b.i.d. he has 3 days left.  2. Mucinex DM 60/30 two tablets b.i.d.  3. Atenolol 25 daily (decreased on October 17, 2005 secondary to      prolonged first degree AV block).  4. Hydrochlorothiazide 25 mg daily.  5. Prilosec 20 mg daily.  6. Baby aspirin 81 mg daily.  7. Tricor 145 daily.  8. Zocor 40 mg q.h.s.  9. Imdur 60 mg 1/2 tablet daily.  10.Airborne b.i.d.  11.Nitroglycerin 0.4 p.r.n.   PAST MEDICAL HISTORY:  Notable for an ischemic cardiomyopathy, a class II  CHF symptoms, two myocardial infarctions in the remote past.  Initial bypass  surgery was in 1991, specifics unknown, redo bypass in 1999 with saphenous  vein graft to the PDA, endarterectomy, saphenous vein graft to the LAD,  saphenous vein graft to the OM1, and saphenous vein graft to OM-2. He has  been entered into the heart failure action trial status post Guidant prism  pacer on October 2003 secondary to inducible sustained ventricular  tachycardia.  This device was distracted secondary to infection in February  2007.  Guidant Vitality II was inserted on March 17, 2005 by Dr. Ladona Ridgel.  The last adenosine Myoview was on March 22, 2003, EF 41%, no ischemia LV  enlargement.  Last catheterization January 14, 2005 showed native three-  vessel coronary artery disease, total saphenous vein graft to the PDA,  saphenous vein graft to the OM1, saphenous vein graft to diagonal and  saphenous vein graft to the OM-2 was patent LIMA to the diagonal and LAD was  patent. Distal LAD disease was noted with a 40-50% left subclavian. History  is also notable for hypertension, hyperlipemia especially in regards to  triglycerides, GERD, peptic ulcer disease, status  post appendectomy,  cholecystectomy, T&A, right thoracotomy secondary to benign nodule.   SOCIAL HISTORY:  He resides in [place] with his wife of 31 years.  He is  employed part-time at the [employer].  He has not smoked for  over 30 years, does not consume alcohol or drugs.  Herbal medications  include the airborne over-the-counter medicine previously listed.  He  maintains a low salt diet.   FAMILY HISTORY:  His mother is deceased at the age of 87 secondary to  coronary artery disease.  His father at the age of 84 possible bacteremia.  He has three brothers deceased, all with myocardial infarctions and one at  the age of 56.  He has two sisters alive, both have had myocardial  infarctions, one has had bypass surgery.   REVIEW OF SYSTEMS:  In addition to the above is notable for headaches and  sinus drainage with his recent viral illness, glasses, dentures.  Flaky  itchy scalp, recent coughing and wheezing.  However, his production has  cleared. Occasional chronic palpitations, dysuria with straining and  nocturia, generalized weakness, arthralgias in hands, shoulders, hips and  knees.  Occasional nausea and diarrhea yesterday, GERD and occasional  dysphagia with water.   PHYSICAL EXAM:  GENERAL:  Well-nourished, well-developed pleasant white male  in no apparent distress.  VITAL SIGNS:  Temperature 98.3, blood pressure 144/76, pulse 81,  respirations 18, 92% saturations on room air.  HEENT: Is unremarkable except for dentures.  NECK: Supple without thyromegaly, adenopathy, JVD. Does have bilateral  carotid bruits.  CHEST:  Symmetrical excursion.  Lungs sounds were clear to auscultation  without rales, rhonchi or wheezes.  He does have decreased breath sounds.  HEART:  PMI is not displaced. Regular rate and rhythm.  Did not appreciate a  S3 or S4. He does have a 2/6 systolic murmur at the sternal border.  All pulses are symmetrical and intact without abdominal or  femoral bruits.  SKIN:  Integument appears to be intact.  ABDOMEN:  Obese.  Bowel sounds present without organomegaly, masses or  tenderness.  EXTREMITIES:  Negative cyanosis, clubbing or edema.  MUSCULOSKELETAL:  NEUROLOGY:  Grossly unremarkable.   Chest x-ray did not show any active disease.  EKG showed normal sinus  rhythm, left axis deviation, and first-degree AV block with a PR interval of  279 which is improved from prior EKG.  Chronic left bundle branch block, no  apparent change from February 2007. H&H  12.9 and 38.0, normal indices,  platelets 213, WBCs 7.5, sodium 139, potassium 3.5, BUN 23, creatinine 1.8,  glucose 120.  PT 15.   IMPRESSIONS:  Shortness of breath and weakness of uncertain etiology.  This  could be related to his recent viral illness or early symptoms of heart  failure. However, BNP in the emergency room was only 142 and he does not  have any physical evidence of heart failure. Hypertension.  Recent renal  insufficiency.  However this is improved with decreased hydrochlorothiazide.  History per past medical history.   DISPOSITION:  Dr. [name] reviewed the patient's past medical history, spoke  with and examined the patient and agrees with the above.  We will increase  his Imdur to 120 mg p.o. daily, admit him to rule out myocardial infarction  and early problems for CHF. We will resume his home medications, cycle  enzymes, interrogate his defibrillator, check urinalysis, echocardiogram,  place him on subcu Lovenox as well as obtain a D-dimer.  We will also  interrogate his defibrillator while he is here in the hospital.     ______________________________  Joellyn Rued, PA-C      [name]. [name], MD, [facility]  Electronically Signed    EW/MEDQ  D:  11/12/2005  T:  11/13/2005  Job:  [id number]   cc:   Evelena Peat, M.D.  Duke Salvia, MD, [facility]

## 2010-06-21 NOTE — H&P (Signed)
NAME:  Sean [name], Sean [name] NO.:  [id number]   MEDICAL RECORD NO.:  [id number]                   PATIENT TYPE:  INP   LOCATION:  1828                                 FACILITY:  [facility]   PHYSICIAN:  Willa Rough, M.D.                  DATE OF BIRTH:  August 28, 1931   DATE OF ADMISSION:  03/17/2003  DATE OF DISCHARGE:                                HISTORY & PHYSICAL   HISTORY OF PRESENT ILLNESS:  Sean [name] is 77 years old with a significant  cardiac history.  He has had some stable angina recently, but today had  prolonged pain and was brought to the emergency room after he did not  respond to four nitroglycerin.   The patient has undergone a CABG in the past.  His last catheterization was  done in June of 2004.  LIMA to the LAD was patent.  There was occlusion of  some of the grafts, but I will have to review the catheterization report.  The ejection fraction was 32% and medical therapy was recommended at that  time.  The patient also has a history of some ventricular tachycardia and  has a Guidant ICD in place.  It has never discharged.   ALLERGIES:  The patient has a cough from ALTACE.   MEDICATIONS:  1. Imdur 60 mg b.i.d.  2. Atenolol 50 mg daily.  3. Altace 2.5 mg.  4. Aspirin 81 mg.  5. Prilosec daily.  6. Niacin 1000 mg in the evening.  7. Hydrochlorothiazide 12.5 mg.  8. Advil p.r.n.  9. Metamucil p.r.n.   OTHER MEDICAL PROBLEMS:  See the complete list below.   SOCIAL HISTORY:  The patient lives in [place], [place], with his  wife.  He does not smoke.  He does not drink alcohol.  He is a retired  Nutritional therapist.   FAMILY HISTORY:  There is a family history of coronary disease.   REVIEW OF SYSTEMS:  The patient has not been having any fevers or chills.  He has no HEENT problems.  There have been no skin problems.  As mentioned  above, he does have some chest pain and shortness of breath today.  He has  had no GU symptoms.  He has some  arthralgias in his legs and back.  He has  occasional GERD.  The remainder of his review of systems is negative.   PHYSICAL EXAMINATION:  VITAL SIGNS:  The temperature is 98 degrees.  The  pulse is 82.  Blood pressure 120/60 when first seen, however, after  morphine, the patient had nausea and his blood pressure dropped into the 70  systolic range and is now improved to 100 systolic.  GENERAL APPEARANCE:  He is stable at this time, but was diaphoretic when his  blood pressure dropped.  HEENT:  No marked abnormalities.  NECK:  Question of a bruit.  CARDIAC:  A soft systolic  murmur.  LUNGS:  Clear.  SKIN:  There are no major skin rashes.  ABDOMEN:  Soft.  GENITOURINARY:  Deferred.  RECTAL:  Deferred.  EXTREMITIES:  There is no significant edema.  There are no significant  musculoskeletal deformities.  NEUROLOGIC:  Grossly intact.   LABORATORY DATA:  Chest x-ray data is pending at this time.  EKG reveals no  acute change.  His laboratories are abnormal for him and are to be repeated.  In particular, his creatinine currently is 1.7.  In June, his creatinine was  in the range of 1.0-1.2.  His INR today is 2.1.  The patient is not on  Coumadin and there is no reason for his INR to be elevated.  This is also  being repeated stat.   PROBLEMS:  1. History of 1-2+ mitral regurgitation.  2. History of some psoriasis.  3. Chronic constipation.  4. Gastrointestinal reflux disease.  5. History of some ventricular tachycardia.  The patient has an implantable     cardioverter defibrillator in place.  6. History of hypertension.  7. Hyperlipidemia.  8. History of surgery for cholecystectomy, hernia repair, and rotator cuff.  9. History of a lung biopsy.  10.      Coronary artery disease.     a. Status post coronary artery bypass graft on two occasions.     b. Most recent catheterization in June of 2004 with some graft occlusions        and medical therapy recommended.     c. Ejection  fraction 32%.     d. Current chest discomfort.  I am quite concerned about his current        status.  We are trying to clarify his laboratory results and see if we        can go to the catheterization laboratory today.     e. Ejection fraction of 32%.  He is getting a fluid bolus at this time.        Hopefully his volume will be stable.     f. ? creatinine 1.7.  To be repeated.     g. ? INR 2.1.  To be repeated.                                                Willa Rough, M.D.    Cleotis Lema  D:  03/17/2003  T:  03/17/2003  Job:  [id number]   cc:   [name]. [name], M.D.   Teena Irani. Arlyce Dice, M.D.  [address]  [place]  [place] [address]  Fax: [phone]

## 2010-06-21 NOTE — Assessment & Plan Note (Signed)
 HEALTHCARE                         ELECTROPHYSIOLOGY OFFICE NOTE   PSALM, SCHAPPELL                     MRN:          [id number]  DATE:03/30/2006                            DOB:          1932-01-18    March 30, 2006   Mr. Welton was seen today in the clinic on March 30, 2006, for follow-  up of his Guidant model number T175 Vitality.  Date of implant was  March 17, 2005, for ischemic cardiomyopathy.  On interrogation of his  device today, his battery voltage is 3.22 with charge time of 7.2  seconds.  R-waves measured 15.8 mV with a ventricular pacing threshold  of 0.6 volts at 0.5 msec and a ventricular lead impedance of 576 ohms.  Shock impedence was 45.  There were no episodes since last  interrogation, no changes were made in his parameters.  He will continue  with his [facility] transmissions and a return office visit in one year's  time.      Altha Harm, LPN  Electronically Signed      Duke Salvia, MD, [facility]  Electronically Signed   PO/MedQ  DD: 03/30/2006  DT: 03/30/2006  Job #: [id number]

## 2010-06-21 NOTE — H&P (Signed)
[name], PFIESTER NO.:  [id number]   MEDICAL RECORD NO.:  [id number]          PATIENT TYPE:  INP   LOCATION:  1843                         FACILITY:  [facility]   PHYSICIAN:  Olga Millers, M.D. LHCDATE OF BIRTH:  1931/10/17   DATE OF ADMISSION:  01/13/2005  DATE OF DISCHARGE:                                HISTORY & PHYSICAL   PRIMARY CARE PHYSICIAN:  Teena Irani. Arlyce Dice, M.D.   PRIMARY CARDIOLOGIST:  [name]. [name], M.D.   CHIEF COMPLAINT:  Chest pain.   HISTORY OF PRESENT ILLNESS:  [name] is a 77 year old male with a history  of coronary artery disease and consistent exertional angina.  At  approximately 2 o'clock today, he had onset of substernal chest pain.  It  started without exertion and is described as a dull and 3 or 4/10 and was  associated with shortness of breath and some nausea but no diaphoresis.  He  says these where his usual anginal symptoms.  He did not take nitroglycerin  because he needed to drive home, and he was concerned about this.  After he  got home, he came with his wife to the emergency room.  He is currently  having chest pain at approximately a 2/10.   PAST MEDICAL HISTORY:  Status post aortocoronary bypass surgery in 1992 and  1998.  He has no history of diabetes or hypertension but has a history of  hyperlipidemia as well as family history of coronary artery disease and  remote history of tobacco.  He has a history of exertional angina.  He also  has a history of Class II CHF symptoms.  He has a history of monomorphic  ventricular tachycardia, status post Guidant ICD.  He has a history of  gastroesophageal reflux disease and peptic ulcer disease.   His last catheterization was in 2004 and showed severe native three-vessel  disease with continued patency of the SVG to OM-2, occluded SVG to OM-1,  distal LAD and PDA as on previous studies.  The LIMA to LAD was patent, and  his EF was approximately 30%.   PAST SURGICAL  HISTORY:  1.  Status post multiple cardiac catheterizations as well as bypass surgery      x2.  2.  Remote history of lung surgery.  3.  He has had hernia surgery x3.  4.  Appendectomy.  5.  Cholecystectomy.  6.  Tonsillectomy.   ALLERGIES:  PENICILLIN and some kinds of TAPE.   CURRENT MEDICATIONS:  1.  Aspirin 81 mg a day.  2.  Atenolol 50 mg a day.  3.  Imdur 60 mg a day.  4.  Omeprazole 20 mg a day.  5.  TriCor 145 mg a day.  6.  Altace 2.5 mg a day.  7.  Zocor 40 mg a day.  8.  hydrochlorothiazide 12.5 mg a day.  9.  Benadryl p.r.n.   SOCIAL HISTORY:  He lives in [place] with his wife and works at  [employer].  He quit tobacco 30 years ago and has approximately  20-pack-year history.  He does not abuse  alcohol or drugs.   FAMILY HISTORY:  His mother died at age 98 with a history of heart disease.  His father died at age 65 possibly of a DVT.  He has multiple siblings with  coronary artery disease.   REVIEW OF SYSTEMS:  He denies fevers, chills, or sweats.  Chest pain is  described above.  He has some chronic dyspnea on exertion.  He has urinary  frequency.  He denies hematuria or dysuria.  He denies depression or  anxiety.  He has occasional arthralgias and reflux symptoms as well as  occasional dysphagia.  Review of Systems is otherwise negative.   PHYSICAL EXAMINATION:  VITAL SIGNS:  Temperature 97.5, blood pressure  147/76, heart rate 67, respiratory rate 18, O2 saturation 96% on room air.  GENERAL:  He is a well-developed, well-nourished white male in no acute  distress.  HEENT:  Head is normocephalic and atraumatic with pupils equal, round, and  reactive to light and accommodation.  Extraocular movements intact.  NECK:  There is no lymphadenopathy, thyromegaly, or JVD, but he has  bilateral bruits.  CARDIOVASCULAR:  Heart is regular rate and rhythm with S1 and S2.  He has a  systolic murmur at the left upper sternal border and at the apex.   Distal  pulses are 2+, and no femoral bruits are appreciated.  LUNGS:  Clear to auscultation bilaterally.  There is chronic erythema at the  ICD site that he states has been there for about a year.  SKIN: No additional rashes or lesions were noted, and surgical scars are  well healed.  ABDOMEN: Soft and nontender with active bowel sounds and no  hepatosplenomegaly by palpation.  EXTREMITIES:  There is no cyanosis, clubbing, or edema.  MUSCULOSKELETAL:  There is no joint deformity or effusions.  NEUROLOGIC:  Alert and oriented.  Cranial nerves II-XII grossly intact.   Chest x-ray is pending.   EKG: Sinus rhythm with a first degree A-V block and PR of 0.308.  He has a  nonspecific intraventricular block and a nonspecific T wave abnormality as  well as PVCs.   Laboratory values are pending at the time of dictation.   IMPRESSION:  Mr. [name] is a 77 year old male with a past history of  coronary artery disease and ischemic cardiomyopathy status post implantable  cardioverter-defibrillator, who also has reflux and recurrent chest pain.  He has chronic exertional angina, but today developed substernal chest pain  similar to previous cardiac pain.  There is no radiation, but there is  associated shortness of breath, nausea, and diaphoresis.  The pain is not  pleuritic or positional.  His pain decreased with lying down and resting but  has not resolved.  He saw his family physician and was sent to the emergency  room.   His chest pain is concerning for unstable anginal pain.  He will be  admitted, and myocardial infarction will be ruled out.  He will be treated  with aspirin, and atenolol will be changed to Toprol for better efficacy  with ischemic cardiomyopathy.  He will be continued on a statin, and  intravenous nitroglycerin and heparin will be added.  Risks and benefits of a cardiac catheterization were discussed with the patient and his wife, and  he agrees to proceed.  This will be  performed in the morning or sooner if he  is unstable.   This is Theodore Demark, P.A.-C. dictating for Olga Millers, M.D., who saw  the patient and determined the plan  of care.      Theodore Demark, P.A. [facility]    ______________________________  Olga Millers, M.D. [facility]    RB/MEDQ  D:  01/13/2005  T:  01/13/2005  Job:  [id number]   cc:   Teena Irani. Arlyce Dice, M.D.  Fax: [phone]   [name]. [name], M.D. [facility]  [address]  [address]  [place]  [place] [address]

## 2010-06-21 NOTE — Op Note (Signed)
NAMEBRECCAN, Sean [name] NO.:  [id number]   MEDICAL RECORD NO.:  [id number]          PATIENT TYPE:  OIB   LOCATION:  2899                         FACILITY:  [facility]   PHYSICIAN:  Duke Salvia, M.D.  DATE OF BIRTH:  Jul 02, 1931   DATE OF PROCEDURE:  06/05/2004  DATE OF DISCHARGE:                                 OPERATIVE REPORT   PREOPERATIVE DIAGNOSES:  Pocket thinning with superficial hematoma, question  device infection.   POSTOPERATIVE DIAGNOSES:  Pocket thinning with superficial hematoma,  question device infection.   PROCEDURE:  Pocket revision and device lead assessment.   CARDIOLOGIST:  Duke Salvia, M.D.   DESCRIPTION OF PROCEDURE:  Following obtaining an informed consent, the  patient was brought to the electrophysiology laboratory and placed on the  fluoroscopic table in the supine position.  After a routine prep and drape  of the left upper chest, lidocaine was infiltrated in the prepectoral  subclavicular region above the line of the previous incision, and was  carried down to the device pocket.  It turned out on the lateral aspect, it  was very, very thin as had been anticipated.  The opening of the pocket was  associated with a gush of clear liquid which I assumed was the lidocaine.  There was no evidence of purulence.   The device was freed up, and the device was removed.  When I put the screws  in to remove the leads from the header, a sort of cloudy fluid emerged.  This fluid was cultured, as was the initial fluid that emerged from the  pocket.  It should be noted that the patient had received vancomycin prior  to incision.  At this point the pocket was expanded caudally, as well as  cephalad.  Hemostasis was obtained.  Surgicel was utilized.  The leads were  then reattached to the pulse generator, which was a Prism #[id number], serial  [id number].  Through the device the bipolar R-wave was 13 with an impedance of  446 and a threshold of 0.08 and  0.4 msec.  The high voltage impedance was 42  ohms.   At this point the patient was sedated.  A one-joule shock was delivered  synchronously in sinus rhythm, due to the high voltage impedance action, and  the impedance was measured at 41 ohms.  At this point the device was  implanted.  The pocket was copiously irrigated with antibiotic-containing  saline solution.  Hemostasis was assured.  The lead in the pulse generator  was secured to the prepectoral fascia.  The wound was closed in three layers  in the  normal fashion.  The wound was washed, dried, and a Benzoin and Steri-Strip  dressing was applied.  The needle counts, sponge counts and instrument  counts were correct at the end of the procedure according to the staff.   The patient tolerated the procedure without apparent complications.      SCK/MEDQ  D:  06/05/2004  T:  06/05/2004  Job:  [id number]   cc:   Electrophysiology Lab - [facility]   [facility]  Teena Irani. Arlyce Dice, M.D.  [address]  [place]  [place] [address]  Fax: [phone]

## 2010-06-21 NOTE — Discharge Summary (Signed)
[place]  Patient:    Sean [name], Sean [name]                     MRN: [id number] Adm. Date:  [id number] Disc. Date: [id number] Attending:  Ronaldo Miyamoto Dictator:   Brita Romp, P.A.-C. CC:         Teena Irani. Arlyce Dice, M.D., [facility]  [name], M.D. [facility]   Discharge Summary  DISCHARGE DIAGNOSES: 1. Coronary artery disease. 2. Mitral regurgitation. 3. Thrombocytopenia. 4. Hyperlipidemia. 5. Gastroesophageal reflux disease.  HOSPITAL COURSE:  The patient is a 77 year old male who presented to the emergency room complaining of substernal chest pain which radiates to the back and to the left arm.  It is accompanied by shortness of breath, but there was no nausea or diaphoresis.  He described the pain as heavy in nature.  He was seen and admitted by Dr. Rollene Rotunda.  Given the patients history, and the fact that his pain today came on at rest, he felt the patient had unstable angina.  His plan was to have the patient taken to the cardiac cath lab the following Monday, as well as to start the patient on Lovenox, nitroglycerin, and a beta blocker.  The following day, the patient denied any further chest pain or shortness of breath.  Dr. Ladona Ridgel noted the patient to be normal sinus rhythm with some lateral T-wave abnormalities consistent with ischemia.  On August 02, 2000, the patient was again seen by Dr. Ladona Ridgel.  He had what he reported to be some few "twinges" of chest pain.  Otherwise, the patient did well.  On August 03, 2000, the patient was taken to the cath lab by Dr. [name]. Dr. [name] found diffuse disease in the LAD after the graft.  He noted the right coronary artery was totally occluded with collateral circulation.  He also found severe disease in the septals.  Both of the patients grafts were patent.  Dr. [name] recommended medical treatment, plus or minus EECP.  The following day, the patient  denied any further chest pain or shortness of breath.  His groin is stable without bleeding hematoma or bruit.  Dr. [name] felt that the patient would be stable for discharge with a follow up cardiology exam in approximately two weeks after discharge.  DISCHARGE MEDICATIONS: 1. Imdur 60 mg q.d. 2. Altace 2.5 mg q.d. 3. Niaspan 1500 mg q.p.m. 4. Nexium 40 mg b.i.d. 5. Atenolol 25 mg q.d. 6. Enteric-coated aspirin 81 mg q.d.  DISCHARGE INSTRUCTIONS: 1. The patient was advised to avoid driving, heavy lifting, tub baths for two    days. 2. He is advised to follow a low fat diet. 3. He is instructed to watch the calf site for any pain, bleeding, or    swelling, and to call the [place] office for any of these problems. 4. He is to contact the office for a follow up appointment with Dr. [name].    He is to follow up with Dr. Arlyce Dice as needed or scheduled.  Electrocardiogram revealed sinus bradycardia at 56.  P-R interval 0.196, QRS 0.116, QTC 0.455, axis -27. DD:  09/08/00 TD:  09/10/00 Job: [id number]

## 2010-06-27 ENCOUNTER — Ambulatory Visit (INDEPENDENT_AMBULATORY_CARE_PROVIDER_SITE_OTHER): Payer: Medicare Other | Admitting: Cardiology

## 2010-06-27 ENCOUNTER — Encounter: Payer: Self-pay | Admitting: Cardiology

## 2010-06-27 DIAGNOSIS — I5022 Chronic systolic (congestive) heart failure: Secondary | ICD-10-CM

## 2010-06-27 DIAGNOSIS — I2510 Atherosclerotic heart disease of native coronary artery without angina pectoris: Secondary | ICD-10-CM

## 2010-06-27 DIAGNOSIS — I2589 Other forms of chronic ischemic heart disease: Secondary | ICD-10-CM

## 2010-06-27 DIAGNOSIS — E785 Hyperlipidemia, unspecified: Secondary | ICD-10-CM

## 2010-06-27 DIAGNOSIS — I25810 Atherosclerosis of coronary artery bypass graft(s) without angina pectoris: Secondary | ICD-10-CM

## 2010-06-27 NOTE — Assessment & Plan Note (Signed)
Need to check lipid and liver.

## 2010-06-27 NOTE — Assessment & Plan Note (Signed)
Hemodynamically stable.  Continue same meds.  Check renal function.

## 2010-06-27 NOTE — Assessment & Plan Note (Signed)
No current active angina.

## 2010-06-27 NOTE — Assessment & Plan Note (Signed)
Appears stable.  No progressive shortness of breath.

## 2010-06-27 NOTE — Progress Notes (Signed)
HPI:  Patient is stable.  He is not having problems.  He is having moderate shortness with activity, but not much change.  No edema at present.  Denies progressive symptoms.   Current Outpatient Prescriptions  Medication Sig Dispense Refill  . aspirin 81 MG tablet Take 81 mg by mouth daily.        . carvedilol (COREG) 3.125 MG tablet Take 3.125 mg by mouth 2 (two) times daily with a meal.        . Casanthranol-Docusate Sodium 30-100 MG CAPS Take by mouth daily. Stool softner       . diphenhydramine-acetaminophen (TYLENOL PM) 25-500 MG TABS Take 1 tablet by mouth at bedtime as needed.        . fenofibrate 160 MG tablet TAKE 1 TABLET BY MOUTH EVERY DAY  30 tablet  0  . furosemide (LASIX) 40 MG tablet TAKE ONE TABLET TWICE A DAY  60 tablet  0  . isosorbide mononitrate (IMDUR) 60 MG 24 hr tablet Take 60 mg by mouth daily.        Marland Kitchen losartan (COZAAR) 50 MG tablet Take 50 mg by mouth. 1/2 tab daily       . nitroGLYCERIN (NITROSTAT) 0.4 MG SL tablet Place 0.4 mg under the tongue every 5 (five) minutes as needed.        Marland Kitchen omeprazole (PRILOSEC) 20 MG capsule TAKE 1 CAPSULE BY MOUTH TWICE DAILY  60 capsule  11  . simvastatin (ZOCOR) 40 MG tablet TAKE ONE TABLET BY MOUTH EVERY NIGHT AT BEDTIME  30 tablet  6  . Tamsulosin HCl (FLOMAX) 0.4 MG CAPS Take by mouth daily.        . vitamin B-12 (CYANOCOBALAMIN) 500 MCG tablet Take 500 mcg by mouth daily.        Marland Kitchen warfarin (COUMADIN) 5 MG tablet Take 1 tablet (5 mg total) by mouth as directed.  30 tablet  2    Allergies  Allergen Reactions  . Carisoprodol     REACTION: rash  . Lisinopril     REACTION: cough  . Penicillins   . Zolpidem Tartrate     Past Medical History  Diagnosis Date  . HYPERLIPIDEMIA 04/07/2008  . HYPERTENSION 04/07/2008  . CAD, ARTERY BYPASS GRAFT 05/22/2008  . CARDIOMYOPATHY, ISCHEMIC 05/22/2008  . MITRAL VALVE PROLAPSE, NON-RHEUMATIC 05/22/2008  . AV BLOCK 05/22/2008  . Chronic systolic heart failure 10/13/2008  . CAROTID ARTERY  STENOSIS 11/15/2009  . GERD 04/07/2008  . RENAL INSUFFICIENCY 10/17/2008  . DEGENERATIVE JOINT DISEASE, LEFT KNEE 04/07/2008  . COLONIC POLYPS, HX OF 04/07/2008  . DIVERTICULITIS, HX OF 04/07/2008  . Atrial fibrillation 04/04/2010    Past Surgical History  Procedure Date  . Cardiac surgery     open heart 1991, 1997, pacemaker insertion St Jude 2010 CD 3231  . Lung surgery 1987  . Hernia repair 1952    2004  . Cholecystectomy   . Appendectomy   . Thoracotomy     secondary to lung mass    Family History  Problem Relation Age of Onset  . Heart disease Mother     CAD, deceased   . Heart disease Sister   . Heart disease Brother     History   Social History  . Marital Status: Married    Spouse Name: N/A    Number of Children: N/A  . Years of Education: N/A   Occupational History  . Not on file.   Social History Main Topics  . Smoking  status: Former Smoker -- 0.5 packs/day for 30 years    Types: Cigarettes    Quit date: 04/25/1978  . Smokeless tobacco: Not on file  . Alcohol Use: Not on file  . Drug Use: Not on file  . Sexually Active: Not on file   Other Topics Concern  . Not on file   Social History Narrative  . No narrative on file    ROS: Please see the HPI.  All other systems reviewed and negative.  PHYSICAL EXAM:  BP 120/58  Pulse 75  Resp 18  Ht 6\' 1"  (1.854 m)  Wt 204 lb (92.534 kg)  BMI 26.91 kg/m2  General: Well developed, well nourished, in no acute distress. Head:  Normocephalic and atraumatic. Neck: no JVD Lungs: Clear to auscultation and percussion. Heart: Normal S1 and S2.  PMI displaced slightly, and apical holosystolic murmur.   Abdomen:  Normal bowel sounds; soft; non tender; no organomegaly Pulses: Pulses normal in all 4 extremities. Extremities: No clubbing or cyanosis. No edema. Neurologic: Alert and oriented x 3.  EKG:  Atiral track and ventricular pacing.   ASSESSMENT AND PLAN:

## 2010-06-27 NOTE — Patient Instructions (Signed)
Your physician recommends that you schedule a follow-up appointment in: 3 MONTHS  Your physician recommends that you continue on your current medications as directed. Please refer to the Current Medication list given to you today.  Your physician recommends that you return for a FASTING lipid profile, liver and BMP. Nothing to eat or drink after midnight (414.01, 428.22, 414.8, 272.2)

## 2010-07-05 ENCOUNTER — Other Ambulatory Visit (INDEPENDENT_AMBULATORY_CARE_PROVIDER_SITE_OTHER): Payer: Medicare Other | Admitting: *Deleted

## 2010-07-05 ENCOUNTER — Encounter (INDEPENDENT_AMBULATORY_CARE_PROVIDER_SITE_OTHER): Payer: Medicare Other | Admitting: *Deleted

## 2010-07-05 DIAGNOSIS — I2589 Other forms of chronic ischemic heart disease: Secondary | ICD-10-CM

## 2010-07-05 DIAGNOSIS — I4891 Unspecified atrial fibrillation: Secondary | ICD-10-CM

## 2010-07-05 DIAGNOSIS — I5022 Chronic systolic (congestive) heart failure: Secondary | ICD-10-CM

## 2010-07-05 DIAGNOSIS — E785 Hyperlipidemia, unspecified: Secondary | ICD-10-CM

## 2010-07-05 DIAGNOSIS — I059 Rheumatic mitral valve disease, unspecified: Secondary | ICD-10-CM

## 2010-07-05 LAB — BASIC METABOLIC PANEL
CO2: 28 mEq/L (ref 19–32)
Glucose, Bld: 105 mg/dL — ABNORMAL HIGH (ref 70–99)
Potassium: 3.3 mEq/L — ABNORMAL LOW (ref 3.5–5.1)
Sodium: 139 mEq/L (ref 135–145)

## 2010-07-05 LAB — HEPATIC FUNCTION PANEL
Albumin: 4 g/dL (ref 3.5–5.2)
Total Bilirubin: 1.1 mg/dL (ref 0.3–1.2)

## 2010-07-05 LAB — LIPID PANEL
HDL: 35.4 mg/dL — ABNORMAL LOW (ref >39.00)
LDL Cholesterol: 59 mg/dL (ref 0–99)
Total CHOL/HDL Ratio: 4
VLDL: 36.4 mg/dL (ref 0.0–40.0)

## 2010-07-10 ENCOUNTER — Other Ambulatory Visit: Payer: Self-pay | Admitting: Family Medicine

## 2010-07-12 ENCOUNTER — Other Ambulatory Visit: Payer: Self-pay | Admitting: Gastroenterology

## 2010-07-12 ENCOUNTER — Ambulatory Visit (HOSPITAL_COMMUNITY)
Admission: RE | Admit: 2010-07-12 | Discharge: 2010-07-12 | Disposition: A | Discharge status: Home / Self Care | Payer: Medicare Other | Source: Ambulatory Visit | Attending: Gastroenterology | Admitting: Gastroenterology

## 2010-07-12 DIAGNOSIS — K644 Residual hemorrhoidal skin tags: Secondary | ICD-10-CM | POA: Insufficient documentation

## 2010-07-12 DIAGNOSIS — K5730 Diverticulosis of large intestine without perforation or abscess without bleeding: Secondary | ICD-10-CM | POA: Insufficient documentation

## 2010-07-12 DIAGNOSIS — E785 Hyperlipidemia, unspecified: Secondary | ICD-10-CM | POA: Insufficient documentation

## 2010-07-12 DIAGNOSIS — Z951 Presence of aortocoronary bypass graft: Secondary | ICD-10-CM | POA: Insufficient documentation

## 2010-07-12 DIAGNOSIS — K5520 Angiodysplasia of colon without hemorrhage: Secondary | ICD-10-CM | POA: Insufficient documentation

## 2010-07-12 DIAGNOSIS — K219 Gastro-esophageal reflux disease without esophagitis: Secondary | ICD-10-CM | POA: Insufficient documentation

## 2010-07-12 DIAGNOSIS — I10 Essential (primary) hypertension: Secondary | ICD-10-CM | POA: Insufficient documentation

## 2010-07-12 DIAGNOSIS — I2510 Atherosclerotic heart disease of native coronary artery without angina pectoris: Secondary | ICD-10-CM | POA: Insufficient documentation

## 2010-07-12 DIAGNOSIS — D126 Benign neoplasm of colon, unspecified: Secondary | ICD-10-CM | POA: Insufficient documentation

## 2010-07-12 DIAGNOSIS — Z09 Encounter for follow-up examination after completed treatment for conditions other than malignant neoplasm: Secondary | ICD-10-CM | POA: Insufficient documentation

## 2010-07-12 DIAGNOSIS — K648 Other hemorrhoids: Secondary | ICD-10-CM | POA: Insufficient documentation

## 2010-07-19 ENCOUNTER — Ambulatory Visit (INDEPENDENT_AMBULATORY_CARE_PROVIDER_SITE_OTHER): Payer: Medicare Other | Admitting: *Deleted

## 2010-07-19 DIAGNOSIS — I4891 Unspecified atrial fibrillation: Secondary | ICD-10-CM

## 2010-07-19 DIAGNOSIS — I059 Rheumatic mitral valve disease, unspecified: Secondary | ICD-10-CM

## 2010-08-02 ENCOUNTER — Ambulatory Visit (INDEPENDENT_AMBULATORY_CARE_PROVIDER_SITE_OTHER): Payer: Medicare Other | Admitting: *Deleted

## 2010-08-02 DIAGNOSIS — I4891 Unspecified atrial fibrillation: Secondary | ICD-10-CM

## 2010-08-02 DIAGNOSIS — I059 Rheumatic mitral valve disease, unspecified: Secondary | ICD-10-CM

## 2010-08-02 LAB — POCT INR: INR: 2.9

## 2010-08-08 ENCOUNTER — Other Ambulatory Visit: Payer: Self-pay | Admitting: Internal Medicine

## 2010-08-08 ENCOUNTER — Ambulatory Visit (INDEPENDENT_AMBULATORY_CARE_PROVIDER_SITE_OTHER): Payer: Medicare Other | Admitting: *Deleted

## 2010-08-08 DIAGNOSIS — I4891 Unspecified atrial fibrillation: Secondary | ICD-10-CM

## 2010-08-08 DIAGNOSIS — I2589 Other forms of chronic ischemic heart disease: Secondary | ICD-10-CM

## 2010-08-08 LAB — REMOTE ICD DEVICE
AL AMPLITUDE: 4.2 mv
AL IMPEDENCE ICD: 410 Ohm
ATRIAL PACING ICD: 1 pct
BRDY-0002RA: 60 {beats}/min
BRDY-0004RA: 120 {beats}/min
DEVICE MODEL ICD: 726856
HV IMPEDENCE: 43 Ohm
MODE SWITCH EPISODES: 1
RV LEAD IMPEDENCE ICD: 480 Ohm
TZAT-0012SLOWVT: 200 ms
TZAT-0013FASTVT: 1
TZAT-0013SLOWVT: 3
TZAT-0018FASTVT: NEGATIVE
TZAT-0018SLOWVT: NEGATIVE
TZAT-0019FASTVT: 7.5 V
TZAT-0020FASTVT: 1 ms
TZAT-0020SLOWVT: 1 ms
TZON-0003FASTVT: 280 ms
TZON-0003SLOWVT: 350 ms
TZON-0005FASTVT: 6
TZON-0005SLOWVT: 6
TZON-0010FASTVT: 80 ms
TZST-0001FASTVT: 2
TZST-0001FASTVT: 4
TZST-0001SLOWVT: 4
TZST-0001SLOWVT: 5
TZST-0003FASTVT: 40 J
TZST-0003FASTVT: 40 J
TZST-0003SLOWVT: 40 J

## 2010-08-12 ENCOUNTER — Encounter: Payer: Self-pay | Admitting: *Deleted

## 2010-08-16 NOTE — Progress Notes (Signed)
icd remote check  

## 2010-08-27 ENCOUNTER — Encounter: Payer: Self-pay | Admitting: Cardiology

## 2010-08-27 ENCOUNTER — Telehealth: Payer: Self-pay | Admitting: Family Medicine

## 2010-08-27 NOTE — Telephone Encounter (Signed)
Yes.  As long as he does not have any major immune problems (eg chemo), high dose prednisone (over 20mg  daily) or allergy to gelatin or neomycin.

## 2010-08-27 NOTE — Telephone Encounter (Signed)
Pt stated with all of his health problems, would he able to get a shingle shot?

## 2010-08-28 NOTE — Telephone Encounter (Signed)
Pt. Notified.

## 2010-08-29 ENCOUNTER — Ambulatory Visit (INDEPENDENT_AMBULATORY_CARE_PROVIDER_SITE_OTHER): Payer: Medicare Other | Admitting: Family Medicine

## 2010-08-29 DIAGNOSIS — Z2911 Encounter for prophylactic immunotherapy for respiratory syncytial virus (RSV): Secondary | ICD-10-CM

## 2010-08-29 DIAGNOSIS — Z299 Encounter for prophylactic measures, unspecified: Secondary | ICD-10-CM

## 2010-08-30 ENCOUNTER — Ambulatory Visit (INDEPENDENT_AMBULATORY_CARE_PROVIDER_SITE_OTHER): Payer: Medicare Other | Admitting: *Deleted

## 2010-08-30 DIAGNOSIS — I4891 Unspecified atrial fibrillation: Secondary | ICD-10-CM

## 2010-08-30 DIAGNOSIS — I059 Rheumatic mitral valve disease, unspecified: Secondary | ICD-10-CM

## 2010-08-30 LAB — POCT INR: INR: 2.9

## 2010-09-01 ENCOUNTER — Other Ambulatory Visit: Payer: Self-pay | Admitting: Cardiology

## 2010-09-07 ENCOUNTER — Other Ambulatory Visit: Payer: Self-pay | Admitting: Cardiology

## 2010-09-30 ENCOUNTER — Encounter: Payer: Self-pay | Admitting: Cardiology

## 2010-09-30 ENCOUNTER — Ambulatory Visit (INDEPENDENT_AMBULATORY_CARE_PROVIDER_SITE_OTHER): Payer: Medicare Other | Admitting: *Deleted

## 2010-09-30 ENCOUNTER — Ambulatory Visit (INDEPENDENT_AMBULATORY_CARE_PROVIDER_SITE_OTHER): Payer: Medicare Other | Admitting: Cardiology

## 2010-09-30 VITALS — BP 127/59 | HR 71 | Ht 73.0 in | Wt 206.0 lb

## 2010-09-30 DIAGNOSIS — I2589 Other forms of chronic ischemic heart disease: Secondary | ICD-10-CM

## 2010-09-30 DIAGNOSIS — I059 Rheumatic mitral valve disease, unspecified: Secondary | ICD-10-CM

## 2010-09-30 DIAGNOSIS — I340 Nonrheumatic mitral (valve) insufficiency: Secondary | ICD-10-CM

## 2010-09-30 DIAGNOSIS — E785 Hyperlipidemia, unspecified: Secondary | ICD-10-CM

## 2010-09-30 DIAGNOSIS — I4891 Unspecified atrial fibrillation: Secondary | ICD-10-CM

## 2010-09-30 LAB — BASIC METABOLIC PANEL
BUN: 23 mg/dL (ref 6–23)
Creatinine, Ser: 1.4 mg/dL (ref 0.4–1.5)
GFR: 52.17 mL/min — ABNORMAL LOW (ref >60.00)
Potassium: 3.7 mEq/L (ref 3.5–5.1)

## 2010-09-30 NOTE — Patient Instructions (Signed)
Your physician recommends that you schedule a follow-up appointment in:4 months with [facility]  Your physician has requested that you have an echocardiogram. Echocardiography is a painless test that uses sound waves to create images of your heart. It provides your doctor with information about the size and shape of your heart and how well your heart's chambers and valves are working. This procedure takes approximately one hour. There are no restrictions for this procedure.

## 2010-09-30 NOTE — Progress Notes (Signed)
HPI:  Overall doing well.  Did not work over the summer because of the heat.  Does well except for moderate activity.  No cramps or chest pain. Some SOB with exertion.  No significant symptoms that have progressed at present.    Current Outpatient Prescriptions  Medication Sig Dispense Refill  . ALPRAZolam (XANAX) 0.25 MG tablet Take 0.25 mg by mouth daily as needed.        Marland Kitchen amitriptyline (ELAVIL) 10 MG tablet Take 10 mg by mouth as needed.        Marland Kitchen aspirin 81 MG tablet Take 81 mg by mouth daily.        . carvedilol (COREG) 3.125 MG tablet TAKE 1 TABLET BY MOUTH TWICE DAILY  60 tablet  5  . Casanthranol-Docusate Sodium 30-100 MG CAPS Take by mouth as needed. Stool softner      . diphenhydramine-acetaminophen (TYLENOL PM) 25-500 MG TABS Take 1 tablet by mouth at bedtime as needed.        . docusate sodium (COLACE) 100 MG capsule Take 100 mg by mouth as needed.        . fenofibrate 160 MG tablet TAKE 1 TABLET BY MOUTH EVERY DAY  90 tablet  2  . furosemide (LASIX) 40 MG tablet TAKE ONE TABLET TWICE A DAY  60 tablet  0  . isosorbide mononitrate (IMDUR) 60 MG 24 hr tablet Take 60 mg by mouth daily.        Marland Kitchen losartan (COZAAR) 50 MG tablet Take 50 mg by mouth. 1/2 tab daily       . nitroGLYCERIN (NITROSTAT) 0.4 MG SL tablet Place 0.4 mg under the tongue every 5 (five) minutes as needed.        Marland Kitchen omeprazole (PRILOSEC) 20 MG capsule TAKE 1 CAPSULE BY MOUTH TWICE DAILY  60 capsule  11  . simvastatin (ZOCOR) 40 MG tablet TAKE ONE TABLET BY MOUTH EVERY NIGHT AT BEDTIME  30 tablet  6  . Tamsulosin HCl (FLOMAX) 0.4 MG CAPS Take by mouth daily.        Marland Kitchen triamcinolone (NASACORT AQ) 55 MCG/ACT nasal inhaler Place 2 sprays into the nose as needed.        . vitamin B-12 (CYANOCOBALAMIN) 500 MCG tablet Take 500 mcg by mouth daily.        Marland Kitchen warfarin (COUMADIN) 5 MG tablet TAKE 1 TABLET BY MOUTH AS DIRECTED  30 tablet  1    Allergies  Allergen Reactions  . Carisoprodol     REACTION: rash  . Lisinopril    REACTION: cough  . Penicillins   . Zolpidem Tartrate     Past Medical History  Diagnosis Date  . HYPERLIPIDEMIA 04/07/2008  . HYPERTENSION 04/07/2008  . CAD, ARTERY BYPASS GRAFT 05/22/2008  . CARDIOMYOPATHY, ISCHEMIC 05/22/2008  . MITRAL VALVE PROLAPSE, NON-RHEUMATIC 05/22/2008  . AV BLOCK 05/22/2008  . Chronic systolic heart failure 10/13/2008  . CAROTID ARTERY STENOSIS 11/15/2009  . GERD 04/07/2008  . RENAL INSUFFICIENCY 10/17/2008  . DEGENERATIVE JOINT DISEASE, LEFT KNEE 04/07/2008  . COLONIC POLYPS, HX OF 04/07/2008  . DIVERTICULITIS, HX OF 04/07/2008  . Atrial fibrillation 04/04/2010  . Ischemic heart disease   . Decreased left ventricular function   . Ventricular tachycardia   . CHF (congestive heart failure)     class 2 to 3    Past Surgical History  Procedure Date  . Cardiac surgery     open heart 1991, 1997, pacemaker insertion St Jude 2010 CD 3231  .  Lung surgery 1987  . Hernia repair 1952    2004  . Cholecystectomy   . Appendectomy   . Thoracotomy     secondary to lung mass  . Insert / replace / remove pacemaker     St Jude unify CD 3231/2010    Family History  Problem Relation Age of Onset  . Heart disease Mother     CAD, deceased   . Coronary artery disease Mother   . Heart disease Sister   . Heart disease Brother     History   Social History  . Marital Status: Married    Spouse Name: N/A    Number of Children: N/A  . Years of Education: N/A   Occupational History  . Not on file.   Social History Main Topics  . Smoking status: Former Smoker -- 0.5 packs/day for 30 years    Types: Cigarettes    Quit date: 04/25/1978  . Smokeless tobacco: Not on file  . Alcohol Use: Not on file  . Drug Use: Not on file  . Sexually Active: Not on file   Other Topics Concern  . Not on file   Social History Narrative  . No narrative on file    ROS: Please see the HPI.  All other systems reviewed and negative.  PHYSICAL EXAM:  BP 127/59  Pulse 71  Ht 6\' 1"   (1.854 m)  Wt 206 lb (93.441 kg)  BMI 27.18 kg/m2  General: Well developed, well nourished, in no acute distress. Head:  Normocephalic and atraumatic. Neck: no JVD Lungs: Clear to auscultation and percussion. Heart: Normal S1 and S2.  No murmur, rubs or gallops.  Abdomen:  Normal bowel sounds; soft; non tender; no organomegaly Pulses: Pulses normal in all 4 extremities. Extremities: No clubbing or cyanosis. No edema. Neurologic: Alert and oriented x 3.  EKG:  Atrial tracking and ventricular pacing at present.  ASSESSMENT AND PLAN:

## 2010-10-02 NOTE — Assessment & Plan Note (Signed)
LDL is at target on combo therapy.  Triglycerides are slightly elevated.

## 2010-10-02 NOTE — Assessment & Plan Note (Signed)
Currently in NSR.  Is on warfarin.

## 2010-10-02 NOTE — Assessment & Plan Note (Addendum)
Has murmur but functional status is pretty good.  MR is noteable on exam.  Would continue to favor medical approach all things considered given overall LV function at baseline.  He is class II.  Mechanism is more related to annular dilitation than MVP.

## 2010-10-02 NOTE — Assessment & Plan Note (Signed)
Holding his own.  Would be appropriate time to repeat echo.  Continue current regimen.

## 2010-10-04 ENCOUNTER — Ambulatory Visit (HOSPITAL_COMMUNITY): Payer: Medicare Other | Attending: Cardiology | Admitting: Radiology

## 2010-10-04 DIAGNOSIS — I2589 Other forms of chronic ischemic heart disease: Secondary | ICD-10-CM | POA: Insufficient documentation

## 2010-10-04 DIAGNOSIS — I359 Nonrheumatic aortic valve disorder, unspecified: Secondary | ICD-10-CM

## 2010-10-04 DIAGNOSIS — I080 Rheumatic disorders of both mitral and aortic valves: Secondary | ICD-10-CM | POA: Insufficient documentation

## 2010-10-04 DIAGNOSIS — I379 Nonrheumatic pulmonary valve disorder, unspecified: Secondary | ICD-10-CM | POA: Insufficient documentation

## 2010-10-04 DIAGNOSIS — I079 Rheumatic tricuspid valve disease, unspecified: Secondary | ICD-10-CM | POA: Insufficient documentation

## 2010-10-04 DIAGNOSIS — I340 Nonrheumatic mitral (valve) insufficiency: Secondary | ICD-10-CM

## 2010-11-01 ENCOUNTER — Ambulatory Visit (INDEPENDENT_AMBULATORY_CARE_PROVIDER_SITE_OTHER): Payer: Medicare Other | Admitting: *Deleted

## 2010-11-01 DIAGNOSIS — I4891 Unspecified atrial fibrillation: Secondary | ICD-10-CM

## 2010-11-01 DIAGNOSIS — I059 Rheumatic mitral valve disease, unspecified: Secondary | ICD-10-CM

## 2010-11-01 LAB — POCT INR: INR: 2.4

## 2010-11-04 LAB — CBC
Hemoglobin: 15.3
Hemoglobin: 15.4
RBC: 5.05
RBC: 5.06
RDW: 14.8
WBC: 7.4

## 2010-11-04 LAB — COMPREHENSIVE METABOLIC PANEL
ALT: 20
Alkaline Phosphatase: 35 — ABNORMAL LOW
CO2: 27
Chloride: 97
Glucose, Bld: 112 — ABNORMAL HIGH
Potassium: 4.6
Sodium: 134 — ABNORMAL LOW
Total Bilirubin: 1.6 — ABNORMAL HIGH
Total Protein: 7.4

## 2010-11-04 LAB — BASIC METABOLIC PANEL
CO2: 27
Calcium: 10
Chloride: 102
GFR calc Af Amer: 52 — ABNORMAL LOW
GFR calc Af Amer: 53 — ABNORMAL LOW
GFR calc non Af Amer: 44 — ABNORMAL LOW
Potassium: 4
Sodium: 137
Sodium: 138

## 2010-11-04 LAB — PROTIME-INR
INR: 1.1
INR: 1.2
Prothrombin Time: 15.4 — ABNORMAL HIGH
Prothrombin Time: 15.8 — ABNORMAL HIGH

## 2010-11-04 LAB — CULTURE, BLOOD (ROUTINE X 2): Culture: NO GROWTH

## 2010-11-06 LAB — BASIC METABOLIC PANEL
BUN: 16
BUN: 17
BUN: 19
CO2: 25
Calcium: 9
Calcium: 9.3
Calcium: 9.4
Calcium: 9.6
Calcium: 9.6
Chloride: 96
Creatinine, Ser: 1.42
Creatinine, Ser: 1.44
Creatinine, Ser: 1.5
Creatinine, Ser: 1.62 — ABNORMAL HIGH
Creatinine, Ser: 1.8 — ABNORMAL HIGH
GFR calc Af Amer: 45 — ABNORMAL LOW
GFR calc Af Amer: 55 — ABNORMAL LOW
GFR calc non Af Amer: 37 — ABNORMAL LOW
GFR calc non Af Amer: 41 — ABNORMAL LOW
GFR calc non Af Amer: 42 — ABNORMAL LOW
GFR calc non Af Amer: 48 — ABNORMAL LOW
Glucose, Bld: 108 — ABNORMAL HIGH
Glucose, Bld: 114 — ABNORMAL HIGH
Glucose, Bld: 118 — ABNORMAL HIGH
Glucose, Bld: 119 — ABNORMAL HIGH
Potassium: 3.7
Potassium: 4

## 2010-11-06 LAB — PROTIME-INR
INR: 1.3
INR: 1.4
INR: 1.7 — ABNORMAL HIGH
INR: 2 — ABNORMAL HIGH
Prothrombin Time: 17.9 — ABNORMAL HIGH
Prothrombin Time: 21 — ABNORMAL HIGH

## 2010-11-06 LAB — HEPARIN LEVEL (UNFRACTIONATED)
Heparin Unfractionated: 0.28 — ABNORMAL LOW
Heparin Unfractionated: 0.36
Heparin Unfractionated: 0.39
Heparin Unfractionated: 0.39
Heparin Unfractionated: 0.43
Heparin Unfractionated: 0.64
Heparin Unfractionated: <0.1 — ABNORMAL LOW

## 2010-11-06 LAB — COMPREHENSIVE METABOLIC PANEL
AST: 29
CO2: 27
Calcium: 9.4
Creatinine, Ser: 1.41
GFR calc Af Amer: 59 — ABNORMAL LOW
GFR calc non Af Amer: 49 — ABNORMAL LOW
Glucose, Bld: 97

## 2010-11-06 LAB — CBC
HCT: 44.1
HCT: 44.4
HCT: 44.8
MCHC: 33.6
MCHC: 33.7
MCHC: 33.7
MCHC: 33.8
MCHC: 34.4
MCV: 90.6
MCV: 90.9
MCV: 91.2
Platelets: 122 — ABNORMAL LOW
Platelets: 128 — ABNORMAL LOW
Platelets: 134 — ABNORMAL LOW
Platelets: 139 — ABNORMAL LOW
Platelets: 144 — ABNORMAL LOW
RBC: 4.81
RBC: 5.05
RDW: 14.4
RDW: 14.5
RDW: 14.6
RDW: 14.7
RDW: 14.8
RDW: 15
WBC: 8.2

## 2010-11-06 LAB — DIFFERENTIAL
Lymphocytes Relative: 22
Lymphs Abs: 1.7
Neutrophils Relative %: 65

## 2010-11-06 LAB — CARDIAC PANEL(CRET KIN+CKTOT+MB+TROPI)
CK, MB: 1.4
CK, MB: 1.4
Relative Index: INVALID
Total CK: 82
Total CK: 89

## 2010-11-06 LAB — LIPID PANEL
Cholesterol: 120
HDL: 24 — ABNORMAL LOW
LDL Cholesterol: 67
Total CHOL/HDL Ratio: 5

## 2010-11-06 LAB — GLUCOSE, CAPILLARY: Glucose-Capillary: 107 — ABNORMAL HIGH

## 2010-11-06 LAB — POCT CARDIAC MARKERS
CKMB, poc: 1.1
Myoglobin, poc: 72.6
Troponin i, poc: <0.05

## 2010-11-06 LAB — TROPONIN I: Troponin I: 0.12 — ABNORMAL HIGH

## 2010-11-06 LAB — LIDOCAINE LEVEL: Lidocaine Lvl: 1.7

## 2010-11-06 LAB — APTT: aPTT: 42 — ABNORMAL HIGH

## 2010-11-07 ENCOUNTER — Ambulatory Visit (INDEPENDENT_AMBULATORY_CARE_PROVIDER_SITE_OTHER): Payer: Medicare Other | Admitting: Internal Medicine

## 2010-11-07 ENCOUNTER — Encounter: Payer: Self-pay | Admitting: Internal Medicine

## 2010-11-07 DIAGNOSIS — Z95810 Presence of automatic (implantable) cardiac defibrillator: Secondary | ICD-10-CM

## 2010-11-07 DIAGNOSIS — I472 Ventricular tachycardia: Secondary | ICD-10-CM

## 2010-11-07 DIAGNOSIS — I4891 Unspecified atrial fibrillation: Secondary | ICD-10-CM

## 2010-11-07 DIAGNOSIS — I2589 Other forms of chronic ischemic heart disease: Secondary | ICD-10-CM

## 2010-11-07 DIAGNOSIS — I5022 Chronic systolic (congestive) heart failure: Secondary | ICD-10-CM

## 2010-11-07 NOTE — Patient Instructions (Signed)
Your physician recommends that you continue on your current medications as directed. Please refer to the Current Medication list given to you today.  Your physician wants you to follow-up in: 1 year with Dr. Klein.  You will receive a reminder letter in the mail two months in advance. If you don't receive a letter, please call our office to schedule the follow-up appointment.  

## 2010-11-07 NOTE — Assessment & Plan Note (Signed)
He has significant heart failure symptoms probably class III-b  I have raised the question with him as to whether he should be referred for the heart failure clinic. In addition wonder whether he should be started on Aldactone, whether his carvedilol dose can be uptitrated. He is euvolemic today.

## 2010-11-07 NOTE — Assessment & Plan Note (Signed)
He has had no significant atrial rfibrillation as detected by his monitor  I will discuss with Dr. [name] is whether his aspirin can be discontinued

## 2010-11-07 NOTE — Assessment & Plan Note (Signed)
The patient's device was interrogated.  The information was reviewed. No changes were made in the programming.    

## 2010-11-07 NOTE — Assessment & Plan Note (Signed)
As above  ; continue current medications 

## 2010-11-07 NOTE — Progress Notes (Signed)
HPI  Sean [name] is a 77 y.o. male Seem in followup for ventricular tachycardia in the setting of ischemic heart disease and a prior ICD implantation. He is doing quite well from arrhythmia point-of- view. He underwent CRT upgrade in September 2010  He remains stable w dyspnea at less 20 feet  This is unchanged. He denies significant chest pain.  He also has significant fatigue. This is accompanied by daytime somnolence nocturnal snoring and disordered breathing as well as frequent awakening.  Past Medical History  Diagnosis Date  . HYPERLIPIDEMIA 04/07/2008  . HYPERTENSION 04/07/2008  . CAD, ARTERY BYPASS GRAFT 05/22/2008  . CARDIOMYOPATHY, ISCHEMIC 05/22/2008  . MITRAL VALVE PROLAPSE, NON-RHEUMATIC 05/22/2008  . AV BLOCK 05/22/2008  . Chronic systolic heart failure 10/13/2008  . CAROTID ARTERY STENOSIS 11/15/2009  . GERD 04/07/2008  . RENAL INSUFFICIENCY 10/17/2008  . DEGENERATIVE JOINT DISEASE, LEFT KNEE 04/07/2008  . COLONIC POLYPS, HX OF 04/07/2008  . DIVERTICULITIS, HX OF 04/07/2008  . Atrial fibrillation 04/04/2010  . Ischemic heart disease   . Decreased left ventricular function   . Ventricular tachycardia   . CHF (congestive heart failure)     class 2 to 3    Past Surgical History  Procedure Date  . Cardiac surgery     open heart 1991, 1997, pacemaker insertion St Jude 2010 CD 3231  . Lung surgery 1987  . Hernia repair 1952    2004  . Cholecystectomy   . Appendectomy   . Thoracotomy     secondary to lung mass  . Insert / replace / remove pacemaker     St Jude unify CD 3231/2010    Current Outpatient Prescriptions  Medication Sig Dispense Refill  . ALPRAZolam (XANAX) 0.25 MG tablet Take 0.25 mg by mouth daily as needed.        Marland Kitchen amitriptyline (ELAVIL) 10 MG tablet Take 10 mg by mouth as needed.        Marland Kitchen aspirin 81 MG tablet Take 81 mg by mouth daily.        . carvedilol (COREG) 3.125 MG tablet TAKE 1 TABLET BY MOUTH TWICE DAILY  60 tablet  5  .  diphenhydramine-acetaminophen (TYLENOL PM) 25-500 MG TABS Take 1 tablet by mouth at bedtime as needed.        . docusate sodium (COLACE) 100 MG capsule Take 100 mg by mouth as needed.        . fenofibrate 160 MG tablet TAKE 1 TABLET BY MOUTH EVERY DAY  90 tablet  2  . furosemide (LASIX) 40 MG tablet TAKE ONE TABLET TWICE A DAY  60 tablet  0  . isosorbide mononitrate (IMDUR) 60 MG 24 hr tablet Take 60 mg by mouth daily.        Marland Kitchen losartan (COZAAR) 50 MG tablet Take 50 mg by mouth. 1/2 tab daily       . lubiprostone (AMITIZA) 24 MCG capsule Take 24 mcg by mouth 2 (two) times daily with a meal.        . nitroGLYCERIN (NITROSTAT) 0.4 MG SL tablet Place 0.4 mg under the tongue every 5 (five) minutes as needed.        Marland Kitchen omeprazole (PRILOSEC) 20 MG capsule TAKE 1 CAPSULE BY MOUTH TWICE DAILY  60 capsule  11  . Potassium Chloride (KLOR-CON 10 PO) Take by mouth.        . simvastatin (ZOCOR) 40 MG tablet TAKE ONE TABLET BY MOUTH EVERY NIGHT AT BEDTIME  30 tablet  6  . Tamsulosin HCl (FLOMAX) 0.4 MG CAPS Take by mouth daily.        Marland Kitchen triamcinolone (NASACORT AQ) 55 MCG/ACT nasal inhaler Place 2 sprays into the nose as needed.        . vitamin B-12 (CYANOCOBALAMIN) 500 MCG tablet Take 500 mcg by mouth daily.        Marland Kitchen warfarin (COUMADIN) 5 MG tablet TAKE 1 TABLET BY MOUTH AS DIRECTED  30 tablet  1    Allergies  Allergen Reactions  . Carisoprodol     REACTION: rash  . Lisinopril     REACTION: cough  . Penicillins   . Zolpidem Tartrate     Review of Systems negative except from HPI and PMH  Physical Exam Well developed and well nourished in no acute distress HENT normal E scleral and icterus clear Neck Supple JVP flat; carotids brisk and full Clear to ausculation Regular rate and rhythm, no murmurs gallops or rub Soft with active bowel sounds No clubbing cyanosis and edema Alert and oriented, grossly normal motor and sensory function Skin Warm and Dry    Assessment and  Plan

## 2010-11-07 NOTE — Assessment & Plan Note (Signed)
Recurrent ventricular tachycardia treated successfully he has ICD

## 2010-11-08 ENCOUNTER — Ambulatory Visit (INDEPENDENT_AMBULATORY_CARE_PROVIDER_SITE_OTHER): Payer: Medicare Other

## 2010-11-08 DIAGNOSIS — Z23 Encounter for immunization: Secondary | ICD-10-CM

## 2010-11-16 ENCOUNTER — Other Ambulatory Visit: Payer: Self-pay | Admitting: Cardiology

## 2010-11-29 ENCOUNTER — Ambulatory Visit (INDEPENDENT_AMBULATORY_CARE_PROVIDER_SITE_OTHER): Payer: Medicare Other | Admitting: *Deleted

## 2010-11-29 DIAGNOSIS — I4891 Unspecified atrial fibrillation: Secondary | ICD-10-CM

## 2010-11-29 DIAGNOSIS — Z7901 Long term (current) use of anticoagulants: Secondary | ICD-10-CM

## 2010-11-29 DIAGNOSIS — I059 Rheumatic mitral valve disease, unspecified: Secondary | ICD-10-CM

## 2010-12-16 ENCOUNTER — Other Ambulatory Visit: Payer: Self-pay | Admitting: Internal Medicine

## 2010-12-20 ENCOUNTER — Other Ambulatory Visit: Payer: Self-pay | Admitting: Cardiology

## 2011-01-05 ENCOUNTER — Other Ambulatory Visit: Payer: Self-pay | Admitting: Family Medicine

## 2011-01-10 ENCOUNTER — Ambulatory Visit (INDEPENDENT_AMBULATORY_CARE_PROVIDER_SITE_OTHER): Payer: Medicare Other | Admitting: *Deleted

## 2011-01-10 DIAGNOSIS — I4891 Unspecified atrial fibrillation: Secondary | ICD-10-CM

## 2011-01-10 DIAGNOSIS — I059 Rheumatic mitral valve disease, unspecified: Secondary | ICD-10-CM

## 2011-01-10 DIAGNOSIS — Z7901 Long term (current) use of anticoagulants: Secondary | ICD-10-CM

## 2011-01-10 LAB — POCT INR: INR: 2.8

## 2011-01-24 ENCOUNTER — Encounter: Payer: Self-pay | Admitting: Cardiology

## 2011-01-24 ENCOUNTER — Ambulatory Visit (INDEPENDENT_AMBULATORY_CARE_PROVIDER_SITE_OTHER): Payer: Medicare Other | Admitting: Cardiology

## 2011-01-24 DIAGNOSIS — G479 Sleep disorder, unspecified: Secondary | ICD-10-CM

## 2011-01-24 DIAGNOSIS — I2589 Other forms of chronic ischemic heart disease: Secondary | ICD-10-CM

## 2011-01-24 DIAGNOSIS — G4733 Obstructive sleep apnea (adult) (pediatric): Secondary | ICD-10-CM | POA: Insufficient documentation

## 2011-01-24 DIAGNOSIS — I10 Essential (primary) hypertension: Secondary | ICD-10-CM

## 2011-01-24 DIAGNOSIS — I4891 Unspecified atrial fibrillation: Secondary | ICD-10-CM

## 2011-01-24 DIAGNOSIS — G4720 Circadian rhythm sleep disorder, unspecified type: Secondary | ICD-10-CM

## 2011-01-24 DIAGNOSIS — E785 Hyperlipidemia, unspecified: Secondary | ICD-10-CM

## 2011-01-24 DIAGNOSIS — I25810 Atherosclerosis of coronary artery bypass graft(s) without angina pectoris: Secondary | ICD-10-CM

## 2011-01-24 LAB — BASIC METABOLIC PANEL
CO2: 30 mEq/L (ref 19–32)
Calcium: 9.4 mg/dL (ref 8.4–10.5)
Creatinine, Ser: 1.8 mg/dL — ABNORMAL HIGH (ref 0.4–1.5)
Glucose, Bld: 114 mg/dL — ABNORMAL HIGH (ref 70–99)

## 2011-01-24 LAB — CBC WITH DIFFERENTIAL/PLATELET
Basophils Absolute: 0 K/uL (ref 0.0–0.1)
Basophils Relative: 0.6 % (ref 0.0–3.0)
Eosinophils Absolute: 0.4 K/uL (ref 0.0–0.7)
Eosinophils Relative: 6 % — ABNORMAL HIGH (ref 0.0–5.0)
HCT: 41.4 % (ref 39.0–52.0)
Hemoglobin: 14 g/dL (ref 13.0–17.0)
Lymphocytes Relative: 31.9 % (ref 12.0–46.0)
Lymphs Abs: 2 K/uL (ref 0.7–4.0)
MCHC: 33.9 g/dL (ref 30.0–36.0)
MCV: 88.8 fl (ref 78.0–100.0)
Monocytes Absolute: 0.8 K/uL (ref 0.1–1.0)
Monocytes Relative: 12.4 % — ABNORMAL HIGH (ref 3.0–12.0)
Neutro Abs: 3 K/uL (ref 1.4–7.7)
Neutrophils Relative %: 49.1 % (ref 43.0–77.0)
Platelets: 164 K/uL (ref 150.0–400.0)
RBC: 4.66 Mil/uL (ref 4.22–5.81)
RDW: 14.4 % (ref 11.5–14.6)
WBC: 6.2 K/uL (ref 4.5–10.5)

## 2011-01-24 MED ORDER — CARVEDILOL 6.25 MG PO TABS
6.2500 mg | ORAL_TABLET | Freq: Two times a day (BID) | ORAL | Status: DC
Start: 2011-01-24 — End: 2011-04-23

## 2011-01-24 NOTE — Assessment & Plan Note (Signed)
Will order sleep study to see if this has any impact.

## 2011-01-24 NOTE — Assessment & Plan Note (Signed)
Last LDL 59.

## 2011-01-24 NOTE — Assessment & Plan Note (Signed)
Followed by Dr. Graciela Husbands and stable.

## 2011-01-24 NOTE — Assessment & Plan Note (Addendum)
Stable at present. Functional class II-III.  Plan 1.  Uptitrate carvedilol to 6.25mg  twice daily 2.  Watch for dizziness 3.  RTC one month and continue up titration as possible.  4.  Would not add aldactone, as he has had increase Cr on ACE in the past.  Would be concerned re: hyperkalemia.  Consider later after up titration of beta blockade.

## 2011-01-24 NOTE — Assessment & Plan Note (Signed)
stable °

## 2011-01-24 NOTE — Assessment & Plan Note (Signed)
Has non DES in SVG.  Placed in 2009.  Currently on warfarin.  Will change ASA to qod.

## 2011-01-24 NOTE — Progress Notes (Signed)
HPI:  Sean [name] is in for a followup visit. I discussed with him some of the recommendations that he and Dr. Graciela Husbands I discussed. Overall his functional status has been pretty good. I reviewed with him again the findings of this echocardiogram. He does have some daytime fatigue, and notices that he gets up quite a few times at night and has some difficulty sleeping. His eye says that he does not snore. He denies any ongoing chest pain.  Current Outpatient Prescriptions  Medication Sig Dispense Refill  . aspirin 81 MG tablet Take 81 mg by mouth daily.        . carvedilol (COREG) 3.125 MG tablet TAKE 1 TABLET BY MOUTH TWICE DAILY  60 tablet  5  . diphenhydramine-acetaminophen (TYLENOL PM) 25-500 MG TABS Take 1 tablet by mouth at bedtime as needed.        . docusate sodium (COLACE) 100 MG capsule Take 100 mg by mouth daily.       . fenofibrate 160 MG tablet TAKE 1 TABLET BY MOUTH EVERY DAY  90 tablet  2  . furosemide (LASIX) 40 MG tablet TAKE ONE TABLET TWICE A DAY  60 tablet  0  . isosorbide mononitrate (IMDUR) 60 MG 24 hr tablet TAKE 1 TABLET BY MOUTH EVERY DAY  90 tablet  2  . losartan (COZAAR) 50 MG tablet Take 50 mg by mouth. 1/2 tab daily       . lubiprostone (AMITIZA) 24 MCG capsule Take 24 mcg by mouth 2 (two) times daily with a meal.        . NITROSTAT 0.4 MG SL tablet TAKE 1 TABLET BY MOUTH UNDER THE TONGUE EVERY 5 MINUTES AS NEEDED FOR CHEST PAIN MAY REPEAT 3 TIMES  25 tablet  5  . omeprazole (PRILOSEC) 20 MG capsule TAKE 1 CAPSULE BY MOUTH TWICE DAILY  60 capsule  11  . Potassium Chloride (KLOR-CON 10 PO) Take by mouth.        . simvastatin (ZOCOR) 40 MG tablet TAKE ONE TABLET BY MOUTH EVERY NIGHT AT BEDTIME  30 tablet  3  . Tamsulosin HCl (FLOMAX) 0.4 MG CAPS Take by mouth daily.        Marland Kitchen triamcinolone (NASACORT AQ) 55 MCG/ACT nasal inhaler Place 2 sprays into the nose as needed.        . vitamin B-12 (CYANOCOBALAMIN) 500 MCG tablet Take 500 mcg by mouth daily.        Marland Kitchen warfarin  (COUMADIN) 5 MG tablet TAKE 1 TABLET BY MOUTH AS DIRECTED  30 tablet  1    Allergies  Allergen Reactions  . Carisoprodol     REACTION: rash  . Lisinopril     REACTION: cough  . Penicillins   . Zolpidem Tartrate     Past Medical History  Diagnosis Date  . HYPERLIPIDEMIA 04/07/2008  . HYPERTENSION 04/07/2008  . CAD, ARTERY BYPASS GRAFT 05/22/2008  . CARDIOMYOPATHY, ISCHEMIC 05/22/2008  . MITRAL VALVE PROLAPSE, NON-RHEUMATIC 05/22/2008  . AV BLOCK 05/22/2008  . Chronic systolic heart failure 10/13/2008  . CAROTID ARTERY STENOSIS 11/15/2009  . GERD 04/07/2008  . RENAL INSUFFICIENCY 10/17/2008  . DEGENERATIVE JOINT DISEASE, LEFT KNEE 04/07/2008  . COLONIC POLYPS, HX OF 04/07/2008  . DIVERTICULITIS, HX OF 04/07/2008  . Atrial fibrillation 04/04/2010  . Ischemic heart disease   . Decreased left ventricular function   . Ventricular tachycardia   . CHF (congestive heart failure)     class 2 to 3    Past  Surgical History  Procedure Date  . Cardiac surgery     open heart 1991, 1997, pacemaker insertion St Jude 2010 CD 3231  . Lung surgery 1987  . Hernia repair 1952    2004  . Cholecystectomy   . Appendectomy   . Thoracotomy     secondary to lung mass  . Insert / replace / remove pacemaker     St Jude unify CD 3231/2010    Family History  Problem Relation Age of Onset  . Heart disease Mother     CAD, deceased   . Coronary artery disease Mother   . Heart disease Sister   . Heart disease Brother     History   Social History  . Marital Status: Married    Spouse Name: N/A    Number of Children: N/A  . Years of Education: N/A   Occupational History  . Not on file.   Social History Main Topics  . Smoking status: Former Smoker -- 0.5 packs/day for 30 years    Types: Cigarettes    Quit date: 04/25/1978  . Smokeless tobacco: Not on file  . Alcohol Use: Not on file  . Drug Use: Not on file  . Sexually Active: Not on file   Other Topics Concern  . Not on file   Social History  Narrative  . No narrative on file    ROS: Please see the HPI.  All other systems reviewed and negative.  PHYSICAL EXAM:  BP 136/70  Pulse 72  Wt 92.874 kg (204 lb 12 oz)  General: Well developed, well nourished, in no acute distress. Head:  Normocephalic and atraumatic. Neck: approx 6mm of JVD. Lungs: Clear to auscultation and percussion. Heart: Normal S1 and S2. Apical holosystolic murmur Abdomen:  Normal bowel sounds; soft; non tender; no organomegaly Pulses: Pulses normal in all 4 extremities. Extremities: No clubbing or cyanosis. Trace edema. Neurologic: Alert and oriented x 3.  EKG:  Atrial tracking and ventricular pacing.  ECHO:  09/2010 Study Conclusions  - Left ventricle: The cavity size was severely dilated. Wall thickness was normal. Systolic function was severely reduced. The estimated ejection fraction was in the range of 20% to 25%. Diffuse hypokinesis. Anterior wall appears to contract well. Features are consistent with a pseudonormal left ventricular filling pattern, with concomitant abnormal relaxation and increased filling pressure (grade 2 diastolic dysfunction). - Aortic valve: Mild to moderate regurgitation. - Mitral valve: Mildly calcified annulus. Normal thickness leaflets . Severe regurgitation. - Left atrium: The atrium was moderately dilated. - Right ventricle: Systolic function was mildly reduced. - Right atrium: The atrium was mildly dilated. - Tricuspid valve: Moderate regurgitation. - Pulmonary arteries: Systolic pressure was mildly increased. PA peak pressure: 31mm Hg (S).      ASSESSMENT AND PLAN:

## 2011-01-24 NOTE — Patient Instructions (Addendum)
Your physician has recommended that you have a sleep study. This test records several body functions during sleep, including: brain activity, eye movement, oxygen and carbon dioxide blood levels, heart rate and rhythm, breathing rate and rhythm, the flow of air through your mouth and nose, snoring, body muscle movements, and chest and belly movement.  Your physician recommends that you have lab work today: BMP and CBC  Your physician has recommended you make the following change in your medication: INCREASE Carvedilol to 6.25mg  take one by mouth twice a day, DECREASE Aspirin to 81mg  by mouth every other day  Your physician recommends that you schedule a follow-up appointment in: 1 MONTH

## 2011-01-27 ENCOUNTER — Telehealth: Payer: Self-pay | Admitting: Cardiology

## 2011-01-27 DIAGNOSIS — I10 Essential (primary) hypertension: Secondary | ICD-10-CM

## 2011-01-27 NOTE — Telephone Encounter (Signed)
Wife called wanting to know what he was to have done on Friday 12/28. Advised he needed to recheck labs and can come any time on Friday. Will put order in.

## 2011-01-27 NOTE — Telephone Encounter (Signed)
Per pt spouse call pt talked to Dr. [name] and was told to come in this Friday 12/28 but pt is not sure why if it is an appt or just labwork please call and advise

## 2011-01-31 ENCOUNTER — Other Ambulatory Visit (INDEPENDENT_AMBULATORY_CARE_PROVIDER_SITE_OTHER): Payer: Medicare Other | Admitting: *Deleted

## 2011-01-31 DIAGNOSIS — I10 Essential (primary) hypertension: Secondary | ICD-10-CM

## 2011-01-31 LAB — BASIC METABOLIC PANEL
BUN: 21 mg/dL (ref 6–23)
Chloride: 104 mEq/L (ref 96–112)
Creatinine, Ser: 1.7 mg/dL — ABNORMAL HIGH (ref 0.4–1.5)
GFR: 40.83 mL/min — ABNORMAL LOW (ref >60.00)

## 2011-02-06 ENCOUNTER — Ambulatory Visit (INDEPENDENT_AMBULATORY_CARE_PROVIDER_SITE_OTHER): Payer: Medicare Other | Admitting: *Deleted

## 2011-02-06 DIAGNOSIS — Z95810 Presence of automatic (implantable) cardiac defibrillator: Secondary | ICD-10-CM

## 2011-02-06 DIAGNOSIS — I472 Ventricular tachycardia, unspecified: Secondary | ICD-10-CM

## 2011-02-06 DIAGNOSIS — I4891 Unspecified atrial fibrillation: Secondary | ICD-10-CM

## 2011-02-08 ENCOUNTER — Encounter: Payer: Self-pay | Admitting: Internal Medicine

## 2011-02-08 ENCOUNTER — Other Ambulatory Visit: Payer: Self-pay | Admitting: Internal Medicine

## 2011-02-08 ENCOUNTER — Other Ambulatory Visit: Payer: Self-pay | Admitting: Cardiology

## 2011-02-08 LAB — REMOTE ICD DEVICE
AL IMPEDENCE ICD: 380 Ohm
BAMS-0003: 70 {beats}/min
BRDY-0002RA: 60 {beats}/min
BRDY-0003RA: 120 {beats}/min
LV LEAD IMPEDENCE ICD: 380 Ohm
MODE SWITCH EPISODES: 1
RV LEAD AMPLITUDE: 12 mv
TZAT-0001FASTVT: 1
TZAT-0004SLOWVT: 8
TZAT-0012FASTVT: 200 ms
TZAT-0012SLOWVT: 200 ms
TZAT-0013FASTVT: 1
TZAT-0018FASTVT: NEGATIVE
TZAT-0018SLOWVT: NEGATIVE
TZAT-0019FASTVT: 7.5 V
TZAT-0019SLOWVT: 7.5 V
TZAT-0020FASTVT: 1 ms
TZON-0003FASTVT: 280 ms
TZON-0003SLOWVT: 350 ms
TZON-0004FASTVT: 25
TZON-0004SLOWVT: 35
TZON-0005FASTVT: 6
TZST-0001FASTVT: 3
TZST-0001FASTVT: 4
TZST-0001SLOWVT: 3
TZST-0001SLOWVT: 5
TZST-0003FASTVT: 40 J
TZST-0003FASTVT: 40 J
TZST-0003SLOWVT: 36 J
TZST-0003SLOWVT: 40 J

## 2011-02-13 ENCOUNTER — Encounter: Payer: Self-pay | Admitting: *Deleted

## 2011-02-13 NOTE — Progress Notes (Signed)
Remote icd check  

## 2011-02-16 ENCOUNTER — Ambulatory Visit (HOSPITAL_BASED_OUTPATIENT_CLINIC_OR_DEPARTMENT_OTHER): Payer: Medicare Other | Attending: Cardiology | Admitting: Radiology

## 2011-02-16 VITALS — Ht 74.0 in | Wt 200.0 lb

## 2011-02-16 DIAGNOSIS — I4891 Unspecified atrial fibrillation: Secondary | ICD-10-CM

## 2011-02-16 DIAGNOSIS — G4720 Circadian rhythm sleep disorder, unspecified type: Secondary | ICD-10-CM

## 2011-02-16 DIAGNOSIS — G4733 Obstructive sleep apnea (adult) (pediatric): Secondary | ICD-10-CM | POA: Insufficient documentation

## 2011-02-19 ENCOUNTER — Telehealth: Payer: Self-pay | Admitting: Cardiology

## 2011-02-19 ENCOUNTER — Other Ambulatory Visit: Payer: Self-pay | Admitting: Orthopedic Surgery

## 2011-02-19 DIAGNOSIS — M25512 Pain in left shoulder: Secondary | ICD-10-CM

## 2011-02-19 NOTE — Telephone Encounter (Signed)
Pt is on warfarin and they need to stop it for one day for a procedure

## 2011-02-19 NOTE — Telephone Encounter (Signed)
I spoke with Sean [name] and Dr Leslee Home would like the pt to have an arthrogram of right shoulder.  The pt needs approval to hold warfarin 4 days prior to test and will need an INR checked the day of test.  Fax note of approval to [phone], Attn: Cassie.  I will forward this message to Dr [name] for review.

## 2011-02-19 NOTE — Telephone Encounter (Signed)
That should be ok to do.  He does not have a prosthetic valve.  There is a slight increase in risk with interruption of warfarin, as long as the surgeon balances the risks and benefits of performance. TS

## 2011-02-20 ENCOUNTER — Ambulatory Visit (INDEPENDENT_AMBULATORY_CARE_PROVIDER_SITE_OTHER): Payer: Medicare Other | Admitting: *Deleted

## 2011-02-20 DIAGNOSIS — I059 Rheumatic mitral valve disease, unspecified: Secondary | ICD-10-CM

## 2011-02-20 DIAGNOSIS — Z7901 Long term (current) use of anticoagulants: Secondary | ICD-10-CM

## 2011-02-20 DIAGNOSIS — I4891 Unspecified atrial fibrillation: Secondary | ICD-10-CM

## 2011-02-20 NOTE — Telephone Encounter (Signed)
I will fax this note with Dr Rosalyn Charters response.  Once the pt's Arthrogram is scheduled Dr Applington's office will need to contact our [facility] [phone]) to make [place] aware of the date of procedure and INR can be scheduled.

## 2011-02-21 ENCOUNTER — Encounter: Payer: Medicare Other | Admitting: *Deleted

## 2011-02-25 NOTE — Telephone Encounter (Signed)
New problem;  Per Olegario Messier - status of stopping warfarin .   Attached refill request to coumadin clinic from [facility].

## 2011-02-25 NOTE — Telephone Encounter (Signed)
I spoke with Olegario Messier and she needs the note from Dr [name] to say that the pt can hold Warfarin 4 days prior to arthrogram.  We will do an addendum on previous phone note.

## 2011-02-25 NOTE — Telephone Encounter (Signed)
This patient can hold warfarin 4 days prior to arthrogram. Please see other comments below.

## 2011-02-26 DIAGNOSIS — G4733 Obstructive sleep apnea (adult) (pediatric): Secondary | ICD-10-CM

## 2011-02-26 NOTE — Procedures (Signed)
[name], Sean NO.:  [name]  MEDICAL RECORD NO.:  [id number]          PATIENT TYPE:  OUT  LOCATION:  SLEEP CENTER                 FACILITY:  [name]  PHYSICIAN:  Barbaraann Share, MD,FCCPDATE OF BIRTH:  03[birth year]06-1931  DATE OF STUDY:  02/16/2011                           NOCTURNAL POLYSOMNOGRAM  REFERRING PHYSICIAN:  [name]. [name], MD, [facility]  INDICATION FOR STUDY:  Hypersomnia with sleep apnea.  EPWORTH SLEEPINESS SCORE:  11.  MEDICATIONS:  SLEEP ARCHITECTURE:  The patient had a total sleep time of 353 minutes with no slow-wave sleep and very small quantities of REM.  Sleep onset latency was prolonged at 45 minutes and REM onset was very rapid at 36 minutes.  Sleep efficiency was 66% during the diagnostic portion of the study and 83% during the titration portion of the study.  RESPIRATORY DATA:  The patient was found to have 52 obstructive and rare central event in the first 120 minutes of sleep.  This gave him an apnea- hypopnea index of 26 events per hour during the diagnostic portion of the study.  The events occurred in all body positions and there was moderate snoring noted throughout.  By protocol, he was fitted with a medium Quattro FX full face mask, and CPAP titration was initiated.  The patient appeared to have adequate control of his obstructive events at 9 cm of water pressure, and had significant worsening of pressure induced central apneas on 10 cm of water.  The sleep technician changed the patient over to bilevel because of the pressure induced centrals, however, could not obtain control of his central apneas.  OXYGEN DATA:  There was O2 desaturation as low as 82%.  CARDIAC DATA:  Occasional PVC noted, but no clinically significant arrhythmias were seen.  MOVEMENT-PARASOMNIA:  The patient had no leg jerks or other abnormal behaviors noted.  IMPRESSIONS-RECOMMENDATIONS: 1. Split night study reveals moderate obstructive sleep apnea  with an     AHI of 26 events per hour, and O2 desaturation as low as 82% during     the diagnostic portion of the study.  The patient was then fitted     with a medium Quattro FX full face mask, and found to have adequate     control of his obstructive events with a CPAP pressure of 9 cm.  He     had significant worsening of pressure induced central apneas at 10     cm and higher, and therefore I would recommend starting less than     this pressure level.  The patient should also be encouraged to     work on modest weight loss. 2. Occasional PVC noted, but no clinically significant arrhythmias     were seen.     Barbaraann Share, MD,FCCP Diplomate, American Board of Sleep Medicine Electronically Signed    [facility]  D:  02/26/2011 08:24:39  T:  02/26/2011 08:38:12  Job:  [id number]

## 2011-03-04 ENCOUNTER — Ambulatory Visit
Admission: RE | Admit: 2011-03-04 | Discharge: 2011-03-04 | Disposition: A | Discharge status: Home / Self Care | Payer: Medicare Other | Source: Ambulatory Visit | Attending: Orthopedic Surgery | Admitting: Orthopedic Surgery

## 2011-03-04 ENCOUNTER — Ambulatory Visit (INDEPENDENT_AMBULATORY_CARE_PROVIDER_SITE_OTHER): Payer: Medicare Other | Admitting: Pharmacist

## 2011-03-04 DIAGNOSIS — M25512 Pain in left shoulder: Secondary | ICD-10-CM

## 2011-03-04 DIAGNOSIS — I059 Rheumatic mitral valve disease, unspecified: Secondary | ICD-10-CM

## 2011-03-04 DIAGNOSIS — I4891 Unspecified atrial fibrillation: Secondary | ICD-10-CM

## 2011-03-04 LAB — POCT INR: INR: 1.2

## 2011-03-04 MED ORDER — IOHEXOL 180 MG/ML  SOLN
20.0000 mL | Freq: Once | INTRAMUSCULAR | Status: AC | PRN
  Administered 2011-03-04: 20 mL via INTRA_ARTICULAR

## 2011-03-12 ENCOUNTER — Ambulatory Visit (INDEPENDENT_AMBULATORY_CARE_PROVIDER_SITE_OTHER): Payer: Medicare Other | Admitting: *Deleted

## 2011-03-12 ENCOUNTER — Ambulatory Visit (INDEPENDENT_AMBULATORY_CARE_PROVIDER_SITE_OTHER): Payer: Medicare Other | Admitting: Cardiology

## 2011-03-12 ENCOUNTER — Encounter: Payer: Self-pay | Admitting: Cardiology

## 2011-03-12 VITALS — BP 132/60 | HR 76 | Ht 71.0 in | Wt 208.0 lb

## 2011-03-12 DIAGNOSIS — I2589 Other forms of chronic ischemic heart disease: Secondary | ICD-10-CM

## 2011-03-12 DIAGNOSIS — G478 Other sleep disorders: Secondary | ICD-10-CM

## 2011-03-12 DIAGNOSIS — I2510 Atherosclerotic heart disease of native coronary artery without angina pectoris: Secondary | ICD-10-CM

## 2011-03-12 DIAGNOSIS — I4891 Unspecified atrial fibrillation: Secondary | ICD-10-CM

## 2011-03-12 DIAGNOSIS — G479 Sleep disorder, unspecified: Secondary | ICD-10-CM

## 2011-03-12 DIAGNOSIS — N259 Disorder resulting from impaired renal tubular function, unspecified: Secondary | ICD-10-CM

## 2011-03-12 DIAGNOSIS — G4733 Obstructive sleep apnea (adult) (pediatric): Secondary | ICD-10-CM

## 2011-03-12 DIAGNOSIS — I059 Rheumatic mitral valve disease, unspecified: Secondary | ICD-10-CM

## 2011-03-12 DIAGNOSIS — Z7901 Long term (current) use of anticoagulants: Secondary | ICD-10-CM

## 2011-03-12 LAB — POCT INR: INR: 2.4

## 2011-03-12 NOTE — Patient Instructions (Addendum)
You have been referred to Dr Shelle Iron in Pulmonary for abnormal sleep study.   Your physician recommends that you have lab work today: [facility]  Your physician recommends that you schedule a follow-up appointment in: 3 MONTHS with Dr [name]  Your physician recommends that you continue on your current medications as directed. Please refer to the Current Medication list given to you today.

## 2011-03-12 NOTE — Assessment & Plan Note (Signed)
Abnormal study with OSA.  Refer to Dr. Eilene Ghazi.

## 2011-03-12 NOTE — Assessment & Plan Note (Signed)
No overall change in hemodynamics.

## 2011-03-12 NOTE — Assessment & Plan Note (Signed)
Stable but would be at least moderate risk for any type of surgery.  It sounds like this may be necessary.

## 2011-03-12 NOTE — Progress Notes (Signed)
HPI:  From a cardiac perspective he is doing pretty well.  He walked nearly two miles today without incident.  He walked a mile yesterday as well.  No angina, and no major limitation of breathing.  Holding his own.  He did have an arthrogram, and at that time had evidence of a "large tear".  He is to see Dr. Leslee Home relatively soon.  Denies current limiting symptoms.  Also had a sleep study and this was abnormal, consistent with moderate obstructive sleep apnea.    Current Outpatient Prescriptions  Medication Sig Dispense Refill  . aspirin 81 MG tablet Take 1 tablet (81 mg total) by mouth every other day.      . carvedilol (COREG) 6.25 MG tablet Take 1 tablet (6.25 mg total) by mouth 2 (two) times daily with a meal.  60 tablet  6  . diphenhydramine-acetaminophen (TYLENOL PM) 25-500 MG TABS Take 1 tablet by mouth at bedtime as needed.        . docusate sodium (COLACE) 100 MG capsule Take 100 mg by mouth daily.       . fenofibrate 160 MG tablet TAKE 1 TABLET BY MOUTH EVERY DAY  90 tablet  2  . furosemide (LASIX) 40 MG tablet TAKE ONE TABLET TWICE A DAY  60 tablet  0  . isosorbide mononitrate (IMDUR) 60 MG 24 hr tablet TAKE 1 TABLET BY MOUTH EVERY DAY  90 tablet  2  . losartan (COZAAR) 50 MG tablet Take 50 mg by mouth. 1/2 tab daily       . lubiprostone (AMITIZA) 24 MCG capsule Take 24 mcg by mouth 2 (two) times daily with a meal.        . NITROSTAT 0.4 MG SL tablet TAKE 1 TABLET BY MOUTH UNDER THE TONGUE EVERY 5 MINUTES AS NEEDED FOR CHEST PAIN MAY REPEAT 3 TIMES  25 tablet  5  . omeprazole (PRILOSEC) 20 MG capsule TAKE 1 CAPSULE BY MOUTH TWICE DAILY  60 capsule  11  . Potassium Chloride (KLOR-CON 10 PO) Take by mouth.        . simvastatin (ZOCOR) 40 MG tablet TAKE ONE TABLET BY MOUTH EVERY NIGHT AT BEDTIME  30 tablet  3  . Tamsulosin HCl (FLOMAX) 0.4 MG CAPS Take by mouth daily.        Marland Kitchen triamcinolone (NASACORT AQ) 55 MCG/ACT nasal inhaler Place 2 sprays into the nose as needed.        .  vitamin B-12 (CYANOCOBALAMIN) 500 MCG tablet Take 500 mcg by mouth daily.        Marland Kitchen warfarin (COUMADIN) 5 MG tablet TAKE 1 TABLET BY MOUTH AS DIRECTED  30 tablet  3    Allergies  Allergen Reactions  . Carisoprodol     REACTION: rash  . Lisinopril     REACTION: cough  . Penicillins   . Zolpidem Tartrate     Past Medical History  Diagnosis Date  . HYPERLIPIDEMIA 04/07/2008  . HYPERTENSION 04/07/2008  . CAD, ARTERY BYPASS GRAFT 05/22/2008  . CARDIOMYOPATHY, ISCHEMIC 05/22/2008  . MITRAL VALVE PROLAPSE, NON-RHEUMATIC 05/22/2008  . AV BLOCK 05/22/2008  . Chronic systolic heart failure 10/13/2008  . CAROTID ARTERY STENOSIS 11/15/2009  . GERD 04/07/2008  . RENAL INSUFFICIENCY 10/17/2008  . DEGENERATIVE JOINT DISEASE, LEFT KNEE 04/07/2008  . COLONIC POLYPS, HX OF 04/07/2008  . DIVERTICULITIS, HX OF 04/07/2008  . Atrial fibrillation 04/04/2010  . Ischemic heart disease   . Decreased left ventricular function   . Ventricular  tachycardia   . CHF (congestive heart failure)     class 2 to 3    Past Surgical History  Procedure Date  . Cardiac surgery     open heart 1991, 1997, pacemaker insertion St Jude 2010 CD 3231  . Lung surgery 1987  . Hernia repair 1952    2004  . Cholecystectomy   . Appendectomy   . Thoracotomy     secondary to lung mass  . Insert / replace / remove pacemaker     St Jude unify CD 3231/2010    Family History  Problem Relation Age of Onset  . Heart disease Mother     CAD, deceased   . Coronary artery disease Mother   . Heart disease Sister   . Heart disease Brother     History   Social History  . Marital Status: Married    Spouse Name: N/A    Number of Children: N/A  . Years of Education: N/A   Occupational History  . Not on file.   Social History Main Topics  . Smoking status: Former Smoker -- 0.5 packs/day for 30 years    Types: Cigarettes    Quit date: 04/25/1974  . Smokeless tobacco: Not on file  . Alcohol Use: Not on file  . Drug Use: Not on file    . Sexually Active: Not on file   Other Topics Concern  . Not on file   Social History Narrative  . No narrative on file    ROS: Please see the HPI.  All other systems reviewed and negative.  PHYSICAL EXAM:  BP 132/60  Pulse 76  Ht 5\' 11"  (1.803 m)  Wt 208 lb (94.348 kg)  BMI 29.01 kg/m2  General: Well developed, well nourished, in no acute distress. Head:  Normocephalic and atraumatic. Neck: neck veins flat.   Lungs: Clear to auscultation and percussion. Heart: Normal S1 and S2.  PMI mildly displaced.  Holosystolic murmur at the apex.   Abdomen:  Normal bowel sounds; soft; non tender; no organomegaly Pulses: Pulses normal in all 4 extremities. Extremities: No clubbing or cyanosis. No edema. Neurologic: Alert and oriented x 3.  EKG:  Atrial tracking, v pacing with only mild QRS prolongation, consistent with BIV pacing.    ASSESSMENT AND PLAN:

## 2011-03-12 NOTE — Assessment & Plan Note (Signed)
Need to recheck BMET.

## 2011-03-13 LAB — BASIC METABOLIC PANEL
BUN: 25 mg/dL — ABNORMAL HIGH (ref 6–23)
Chloride: 102 mEq/L (ref 96–112)
Creatinine, Ser: 1.9 mg/dL — ABNORMAL HIGH (ref 0.4–1.5)
GFR: 37.31 mL/min — ABNORMAL LOW (ref >60.00)
Potassium: 3.7 mEq/L (ref 3.5–5.1)

## 2011-03-19 ENCOUNTER — Other Ambulatory Visit: Payer: Self-pay

## 2011-03-19 DIAGNOSIS — I2510 Atherosclerotic heart disease of native coronary artery without angina pectoris: Secondary | ICD-10-CM

## 2011-04-01 ENCOUNTER — Encounter: Payer: Self-pay | Admitting: Pulmonary Disease

## 2011-04-01 ENCOUNTER — Ambulatory Visit (INDEPENDENT_AMBULATORY_CARE_PROVIDER_SITE_OTHER): Payer: Medicare Other | Admitting: Pulmonary Disease

## 2011-04-01 VITALS — BP 106/58 | HR 69 | Temp 98.3°F | Ht 73.0 in | Wt 207.6 lb

## 2011-04-01 DIAGNOSIS — G4733 Obstructive sleep apnea (adult) (pediatric): Secondary | ICD-10-CM

## 2011-04-01 NOTE — Patient Instructions (Signed)
Will start on cpap, and see you back in 5 weeks to see how things are going.   Work on modest weight reduction Please call if having issues with cpap tolerance.

## 2011-04-01 NOTE — Assessment & Plan Note (Signed)
The patient has moderate obstructive sleep apnea noted on his recent sleep study.  He is clearly symptomatic during the night and also with his decreased alertness during the day, and also has underlying cardiovascular disease that can be adversely impacted by his sleep disordered breathing.  I've had a long discussion with the patient about his sleep apnea, including the cardiovascular impact.  I have recommended that he start on CPAP, and he is agreeable.  I have also encouraged him to work on modest weight loss.

## 2011-04-01 NOTE — Progress Notes (Signed)
  Subjective:    Patient ID: Sean [name], male    DOB: 1931/07/10, 78 y.o.   MRN: [id number]  HPI The patient is a 78 year old male who I've been asked to see for management of obstructive sleep apnea.  He has recently had a sleep study that showed moderate sleep apnea, with an AHI of 26 events per hour.  He was started on CPAP as part of a split-night protocol, with a good response.  The patient's wife states that she does not hear significant snoring, and has not seen an abnormal breathing pattern during sleep.  However, the patient has frequent awakenings at night, and has not rested in the mornings upon arising.  He notes significant sleep pressure during the day with any period of inactivity, and will fall asleep any time that he reads or watches television.  He denies any sleepiness with driving, but never drives for more than an hour.  The patient states that his weight is neutral over the last 2 years, and his Epworth sleepiness score today is 10.  Sleep Questionnaire: What time do you typically go to bed?( Between what hours) 8pm-9pm How long does it take you to fall asleep? seconds How many times during the night do you wake up? 8 What time do you get out of bed to start your day? 0630 Do you drive or operate heavy machinery in your occupation? No How much has your weight changed (up or down) over the past two years? (In pounds) 0 oz (0 kg) Have you ever had a sleep study before? Yes If yes, location of study? [facility] If yes, date of study? 02/16/2011 Do you currently use CPAP? No Do you wear oxygen at any time? No    Review of Systems  Constitutional: Negative for fever and unexpected weight change.  HENT: Positive for rhinorrhea, sneezing and postnasal drip. Negative for ear pain, nosebleeds, congestion, sore throat, trouble swallowing, dental problem and sinus pressure.   Eyes: Negative for redness and itching.  Respiratory: Positive for cough, chest tightness and shortness  of breath. Negative for wheezing.   Cardiovascular: Positive for chest pain and palpitations. Negative for leg swelling.  Gastrointestinal: Negative for nausea and vomiting.  Genitourinary: Positive for difficulty urinating. Negative for dysuria.  Musculoskeletal: Negative for joint swelling.  Skin: Negative for rash.  Neurological: Positive for headaches.  Hematological: Bruises/bleeds easily.  Psychiatric/Behavioral: Negative for dysphoric mood. The patient is not nervous/anxious.        Objective:   Physical Exam Constitutional:  Well developed, no acute distress  HENT:  Nares patent without discharge, but narrowed bilat.   Oropharynx without exudate, palate and uvula are elongated.  Eyes:  Perrla, eomi, no scleral icterus  Neck:  No JVD, no TMG  Cardiovascular:  Normal rate, regular rhythm, no rubs or gallops.  2/6 sem        Intact distal pulses  Pulmonary :  Normal breath sounds, no stridor or respiratory distress   No rales, rhonchi, or wheezing  Abdominal:  Soft, nondistended, bowel sounds present.  No tenderness noted.   Musculoskeletal:  1+ lower extremity edema noted.  Lymph Nodes:  No cervical lymphadenopathy noted  Skin:  No cyanosis noted  Neurologic:  Alert, appropriate, moves all 4 extremities without obvious deficit.         Assessment & Plan:

## 2011-04-03 ENCOUNTER — Encounter: Payer: Self-pay | Admitting: Family Medicine

## 2011-04-03 ENCOUNTER — Ambulatory Visit (INDEPENDENT_AMBULATORY_CARE_PROVIDER_SITE_OTHER): Payer: Medicare Other | Admitting: Family Medicine

## 2011-04-03 VITALS — BP 120/62 | Temp 98.0°F | Wt 207.0 lb

## 2011-04-03 DIAGNOSIS — R404 Transient alteration of awareness: Secondary | ICD-10-CM

## 2011-04-03 DIAGNOSIS — I459 Conduction disorder, unspecified: Secondary | ICD-10-CM

## 2011-04-03 DIAGNOSIS — I2589 Other forms of chronic ischemic heart disease: Secondary | ICD-10-CM

## 2011-04-03 LAB — CBC WITH DIFFERENTIAL/PLATELET
Basophils Relative: 0.9 % (ref 0.0–3.0)
Hemoglobin: 13.9 g/dL (ref 13.0–17.0)
Lymphocytes Relative: 29.6 % (ref 12.0–46.0)
MCHC: 33.5 g/dL (ref 30.0–36.0)
Monocytes Relative: 14.8 % — ABNORMAL HIGH (ref 3.0–12.0)
Neutro Abs: 3.1 10*3/uL (ref 1.4–7.7)
RBC: 4.7 Mil/uL (ref 4.22–5.81)

## 2011-04-03 LAB — BASIC METABOLIC PANEL
BUN: 23 mg/dL (ref 6–23)
Calcium: 9.6 mg/dL (ref 8.4–10.5)
Chloride: 104 mEq/L (ref 96–112)
Creatinine, Ser: 1.6 mg/dL — ABNORMAL HIGH (ref 0.4–1.5)
GFR: 44.99 mL/min — ABNORMAL LOW (ref >60.00)
Glucose, Bld: 94 mg/dL (ref 70–99)

## 2011-04-03 LAB — POCT URINALYSIS DIPSTICK
Blood, UA: NEGATIVE
Nitrite, UA: NEGATIVE
Urobilinogen, UA: 1
pH, UA: 6

## 2011-04-03 LAB — HEPATIC FUNCTION PANEL
AST: 20 U/L (ref 0–37)
Albumin: 4 g/dL (ref 3.5–5.2)
Alkaline Phosphatase: 31 U/L — ABNORMAL LOW (ref 39–117)
Total Protein: 7.3 g/dL (ref 6.0–8.3)

## 2011-04-03 NOTE — Patient Instructions (Signed)
Follow up immediately for any further episodes of altered consciousness No driving recommended at this time

## 2011-04-03 NOTE — Progress Notes (Signed)
Subjective:    Patient ID: Sean [name], male    DOB: 08-May-1931, 78 y.o.   MRN: [id number]  HPI  Acute visit. Patient accompanied by wife. Was doing well until around 6 PM last night. He had just ascended from basement and sat down in his recliner. Wife noted that he seemed to stiffen up for approximately 4-5 seconds in a tonic type posture. No urine incontinence. She went to arouse him and after a few seconds he loosened up. He was groggy for several minutes afterwards but had some recollection of the event. He denied any chest pain. He has a defibrillator but denied any jolt or pain in the chest region.  No history of seizure activity. Patient felt somewhat weak and clammy afterwards for several minutes. Initially blood pressure 177/126 per wife and 10 minutes later 129/72. No chest pain.  Recent electrolytes normal. Patient denies any headache. No recent head injury. No fever or chills. No appetite or weight changes.  He feels back to baseline at this time.  Past Medical History  Diagnosis Date  . HYPERLIPIDEMIA 04/07/2008  . HYPERTENSION 04/07/2008  . CAD, ARTERY BYPASS GRAFT 05/22/2008  . CARDIOMYOPATHY, ISCHEMIC 05/22/2008  . MITRAL VALVE PROLAPSE, NON-RHEUMATIC 05/22/2008  . AV BLOCK 05/22/2008  . Chronic systolic heart failure 10/13/2008  . CAROTID ARTERY STENOSIS 11/15/2009  . GERD 04/07/2008  . RENAL INSUFFICIENCY 10/17/2008  . DEGENERATIVE JOINT DISEASE, LEFT KNEE 04/07/2008  . COLONIC POLYPS, HX OF 04/07/2008  . DIVERTICULITIS, HX OF 04/07/2008  . Atrial fibrillation 04/04/2010  . Ischemic heart disease   . Decreased left ventricular function   . Ventricular tachycardia   . CHF (congestive heart failure)     class 2 to 3   Past Surgical History  Procedure Date  . Cardiac surgery     open heart 1991, 1997, pacemaker insertion St Jude 2010 CD 3231  . Lung surgery 1987  . Hernia repair 1952    2004  . Cholecystectomy   . Appendectomy   . Thoracotomy     secondary to lung mass  .  Insert / replace / remove pacemaker     St Jude unify CD 3231/2010    reports that he quit smoking about 36 years ago. His smoking use included Cigarettes. He has a 15 pack-year smoking history. He has never used smokeless tobacco. He reports that he does not drink alcohol or use illicit drugs. family history includes Coronary artery disease in his mother and Heart disease in his brother, mother, and sister. Allergies  Allergen Reactions  . Carisoprodol     REACTION: rash  . Lisinopril     REACTION: cough  . Penicillins   . Tape     Cloth Tape tears skin  . Zolpidem Tartrate       Review of Systems  Constitutional: Negative for fever, chills, appetite change and unexpected weight change.  HENT: Negative for trouble swallowing and voice change.   Eyes: Negative for visual disturbance.  Respiratory: Negative for cough and shortness of breath.   Cardiovascular: Negative for chest pain, palpitations and leg swelling.  Gastrointestinal: Negative for nausea, vomiting and abdominal pain.  Genitourinary: Negative for dysuria.  Neurological: Negative for dizziness and headaches.  Psychiatric/Behavioral: Negative for confusion and dysphoric mood.       Objective:   Physical Exam  Constitutional: He is oriented to person, place, and time. He appears well-developed and well-nourished.  HENT:  Mouth/Throat: Oropharynx is clear and moist.  Neck: Neck supple.  No thyromegaly present.  Cardiovascular: Normal rate.   Pulmonary/Chest: Effort normal and breath sounds normal. No respiratory distress. He has no wheezes. He has no rales.  Musculoskeletal: He exhibits no edema.  Lymphadenopathy:    He has no cervical adenopathy.  Neurological: He is alert and oriented to person, place, and time. No cranial nerve deficit.       No focal strength deficits. Normal cerebellar by finger-nose testing. Gait normal. Babinski's downgoing bilaterally  Psychiatric: He has a normal mood and affect. His  behavior is normal.          Assessment & Plan:  Episode of brief altered consciousness. By description questionable seizure. Doubt defibrillation event. Obtain screening labs and neuro imaging. Consider EEG and neurology referral

## 2011-04-03 NOTE — Progress Notes (Signed)
Quick Note:  Pt aware ______ 

## 2011-04-04 ENCOUNTER — Encounter: Payer: Medicare Other | Admitting: *Deleted

## 2011-04-04 ENCOUNTER — Telehealth: Payer: Self-pay | Admitting: *Deleted

## 2011-04-04 NOTE — Telephone Encounter (Signed)
Spoke with wife.  Mr. Becraft was not aware of the shock on 04/02/11.  He did have an episode where he did have a "blank stare" for a few seconds but never passed out.  This did coincide with the time of his episode.  He has felt fine since.  I instructed his wife if he has any issues this weekend she is to call EMS and go to [facility] ED.  I will review this with Dr. Graciela Husbands next week.

## 2011-04-07 ENCOUNTER — Other Ambulatory Visit: Payer: Medicare Other

## 2011-04-07 ENCOUNTER — Telehealth: Payer: Self-pay | Admitting: Family Medicine

## 2011-04-07 NOTE — Telephone Encounter (Signed)
I did call pt back, I do not recall calling him Friday, nothing in phone notes other than the Cardiology office phone note

## 2011-04-07 NOTE — Telephone Encounter (Signed)
Pt called and said that he is returning a call that he rcvd on Friday.

## 2011-04-11 ENCOUNTER — Telehealth: Payer: Self-pay | Admitting: *Deleted

## 2011-04-11 ENCOUNTER — Ambulatory Visit (INDEPENDENT_AMBULATORY_CARE_PROVIDER_SITE_OTHER): Payer: Medicare Other | Admitting: *Deleted

## 2011-04-11 ENCOUNTER — Encounter: Payer: Self-pay | Admitting: Internal Medicine

## 2011-04-11 DIAGNOSIS — E876 Hypokalemia: Secondary | ICD-10-CM

## 2011-04-11 DIAGNOSIS — I059 Rheumatic mitral valve disease, unspecified: Secondary | ICD-10-CM

## 2011-04-11 DIAGNOSIS — I2510 Atherosclerotic heart disease of native coronary artery without angina pectoris: Secondary | ICD-10-CM

## 2011-04-11 DIAGNOSIS — Z7901 Long term (current) use of anticoagulants: Secondary | ICD-10-CM

## 2011-04-11 DIAGNOSIS — I472 Ventricular tachycardia, unspecified: Secondary | ICD-10-CM

## 2011-04-11 DIAGNOSIS — I4891 Unspecified atrial fibrillation: Secondary | ICD-10-CM

## 2011-04-11 DIAGNOSIS — I5022 Chronic systolic (congestive) heart failure: Secondary | ICD-10-CM

## 2011-04-11 DIAGNOSIS — Z95810 Presence of automatic (implantable) cardiac defibrillator: Secondary | ICD-10-CM

## 2011-04-11 LAB — ICD DEVICE OBSERVATION
AL AMPLITUDE: 2.9 mv
AL IMPEDENCE ICD: 387.5 Ohm
BAMS-0001: 150 {beats}/min
BAMS-0003: 70 {beats}/min
LV LEAD IMPEDENCE ICD: 362.5 Ohm
LV LEAD THRESHOLD: 2.75 V
MODE SWITCH EPISODES: 3
RV LEAD THRESHOLD: 0.75 V
TOT-0006: 20100927000000
TOT-0007: 1
TOT-0008: 0
TZAT-0001SLOWVT: 1
TZAT-0004FASTVT: 8
TZAT-0012FASTVT: 200 ms
TZAT-0012SLOWVT: 200 ms
TZAT-0013FASTVT: 1
TZAT-0018FASTVT: NEGATIVE
TZAT-0020SLOWVT: 1 ms
TZON-0003FASTVT: 280 ms
TZON-0005SLOWVT: 6
TZST-0001FASTVT: 2
TZST-0001FASTVT: 3
TZST-0001FASTVT: 5
TZST-0001SLOWVT: 2
TZST-0001SLOWVT: 4
TZST-0003FASTVT: 40 J
TZST-0003FASTVT: 40 J
TZST-0003FASTVT: 40 J
TZST-0003SLOWVT: 36 J
VENTRICULAR PACING ICD: 99 pct
VF: 2

## 2011-04-11 LAB — BASIC METABOLIC PANEL
BUN: 23 mg/dL (ref 6–23)
Calcium: 9.2 mg/dL (ref 8.4–10.5)
GFR: 41.93 mL/min — ABNORMAL LOW (ref >60.00)
Glucose, Bld: 96 mg/dL (ref 70–99)

## 2011-04-11 NOTE — Patient Instructions (Signed)
Entered in error Debbie Delaynee Alred RN  

## 2011-04-11 NOTE — Telephone Encounter (Signed)
BMP reviewed by Dr. Tenny Craw. Recommended pt take an extra of K+ today Increase K+ to daily Repeat bmp in 10 days Pt is aware and will come in on 3/15 for bmp Mylo Red RN

## 2011-04-11 NOTE — Progress Notes (Signed)
ICD check as a walk-in.  Pt received shock last week for VT.  ATP attempted to break rhythm, but accelerated and then was terminated with shock.  Pt also had episode of VT that was terminated by ATP on 03-24-11.  As far as patient knows, this is the first time he has ever received appropriate therapy from his device.  He has been advised not to drive for 6 month per Dr Graciela Husbands.  Appt was made for patient on 3-20 to see Dr Graciela Husbands for further evaluation.  He has had no chest pain or shortness of breath.  Otherwise device function was normal.

## 2011-04-13 ENCOUNTER — Other Ambulatory Visit: Payer: Self-pay | Admitting: Family Medicine

## 2011-04-18 ENCOUNTER — Other Ambulatory Visit (INDEPENDENT_AMBULATORY_CARE_PROVIDER_SITE_OTHER): Payer: Medicare Other

## 2011-04-18 ENCOUNTER — Telehealth: Payer: Self-pay | Admitting: Cardiology

## 2011-04-18 DIAGNOSIS — E876 Hypokalemia: Secondary | ICD-10-CM

## 2011-04-18 LAB — BASIC METABOLIC PANEL
BUN: 20 mg/dL (ref 6–23)
CO2: 27 mEq/L (ref 19–32)
Chloride: 104 mEq/L (ref 96–112)
Creatinine, Ser: 1.5 mg/dL (ref 0.4–1.5)
Glucose, Bld: 105 mg/dL — ABNORMAL HIGH (ref 70–99)

## 2011-04-18 NOTE — Telephone Encounter (Signed)
Pt Dropped Off  Application for Disability Pla-Card, Placed In [place]. 04/18/11.KM

## 2011-04-21 ENCOUNTER — Telehealth: Payer: Self-pay | Admitting: Cardiology

## 2011-04-21 NOTE — Telephone Encounter (Signed)
New Msg: Pt wife calling stating that pt had an episode on Saturday afternoon and wants to know if anything usually was detected on pt device. Pt has appt to see Dr. Graciela Husbands on Wednesday and wants to know if he needs to be seen sooner due to recent episode. Please return pt wife call to discuss further.

## 2011-04-21 NOTE — Telephone Encounter (Signed)
Pt received shock Saturday afternoon. Pt was just riding in car and per wife just went limp and looked like he passed out. Pt has appt on 04-23-11 @ 1515 with Dr Graciela Husbands. Pt has felt fine since Saturday.

## 2011-04-23 ENCOUNTER — Encounter: Payer: Self-pay | Admitting: Internal Medicine

## 2011-04-23 ENCOUNTER — Encounter: Payer: Self-pay | Admitting: *Deleted

## 2011-04-23 ENCOUNTER — Ambulatory Visit (INDEPENDENT_AMBULATORY_CARE_PROVIDER_SITE_OTHER): Payer: Medicare Other | Admitting: Internal Medicine

## 2011-04-23 ENCOUNTER — Other Ambulatory Visit: Payer: Self-pay | Admitting: Internal Medicine

## 2011-04-23 VITALS — BP 131/62 | HR 73 | Ht 73.0 in | Wt 203.0 lb

## 2011-04-23 DIAGNOSIS — R072 Precordial pain: Secondary | ICD-10-CM

## 2011-04-23 DIAGNOSIS — I2510 Atherosclerotic heart disease of native coronary artery without angina pectoris: Secondary | ICD-10-CM

## 2011-04-23 DIAGNOSIS — I4891 Unspecified atrial fibrillation: Secondary | ICD-10-CM

## 2011-04-23 DIAGNOSIS — I5022 Chronic systolic (congestive) heart failure: Secondary | ICD-10-CM

## 2011-04-23 DIAGNOSIS — I472 Ventricular tachycardia, unspecified: Secondary | ICD-10-CM

## 2011-04-23 DIAGNOSIS — Z95810 Presence of automatic (implantable) cardiac defibrillator: Secondary | ICD-10-CM

## 2011-04-23 LAB — ICD DEVICE OBSERVATION
BAMS-0001: 150 {beats}/min
BAMS-0003: 70 {beats}/min
TZAT-0004SLOWVT: 8
TZAT-0012FASTVT: 200 ms
TZAT-0012SLOWVT: 200 ms
TZAT-0013FASTVT: 1
TZAT-0013SLOWVT: 3
TZAT-0018SLOWVT: NEGATIVE
TZAT-0019SLOWVT: 7.5 V
TZON-0003SLOWVT: 350 ms
TZON-0004SLOWVT: 35
TZON-0010SLOWVT: 80 ms
TZST-0001FASTVT: 3
TZST-0001FASTVT: 4
TZST-0001SLOWVT: 3
TZST-0001SLOWVT: 5
TZST-0003FASTVT: 36 J
TZST-0003FASTVT: 40 J
TZST-0003SLOWVT: 40 J
TZST-0003SLOWVT: 40 J

## 2011-04-23 MED ORDER — CARVEDILOL 12.5 MG PO TABS: 12.5000 mg | ORAL_TABLET | Freq: Two times a day (BID) | ORAL | Status: DC

## 2011-04-23 NOTE — Assessment & Plan Note (Signed)
The patient has had recurrent ventricular tachycardia and has been associated with syncope. This occurs contextually in the setting of increasing shortness of breath as well as recent onset chest discomfort. He has known o ischemic heart disease. We'll plan to undertake catheterization.  We'll increase his diuretics in the interim given the suggestions of congestive heart failure increase his carvedilol. Following the catheterization will make a decision regarding antiarrhythmic therapy

## 2011-04-23 NOTE — Assessment & Plan Note (Signed)
The patient's device was interrogated.  The information was reviewed. No changes were made in the programming.    

## 2011-04-23 NOTE — Progress Notes (Signed)
HPI  Sean [name] is a 78 y.o. male followup for ventricular tachycardia in the setting of ischemic heart disease and a prior ICD implantation.  Some tenderness  He underwent CRT upgrade in September 2010   He is seen today because of defibrillator shock therapy. This happened a couple weeks ago. That happened again on weekends. There were associated with loss of consciousness  He has of late noted exertional chest discomfort as well as increasing shortness of breath. There has been no edema. There is an orthopnea or nocturnal dyspnea.  Past week however was checked and had a potassium of 3.4 it was repleted and is now normal.. A magnesium level was not drawn.   Past Medical History  Diagnosis Date  . HYPERLIPIDEMIA 04/07/2008  . HYPERTENSION 04/07/2008  . CAD, ARTERY BYPASS GRAFT 05/22/2008  . CARDIOMYOPATHY, ISCHEMIC 05/22/2008  . MITRAL VALVE PROLAPSE, NON-RHEUMATIC 05/22/2008  . AV BLOCK 05/22/2008  . Chronic systolic heart failure 10/13/2008  . CAROTID ARTERY STENOSIS 11/15/2009  . GERD 04/07/2008  . RENAL INSUFFICIENCY 10/17/2008  . DEGENERATIVE JOINT DISEASE, LEFT KNEE 04/07/2008  . COLONIC POLYPS, HX OF 04/07/2008  . DIVERTICULITIS, HX OF 04/07/2008  . Atrial fibrillation 04/04/2010  . Ischemic heart disease   . Decreased left ventricular function   . Ventricular tachycardia   . CHF (congestive heart failure)     class 2 to 3    Past Surgical History  Procedure Date  . Cardiac surgery     open heart 1991, 1997, pacemaker insertion St Jude 2010 CD 3231  . Lung surgery 1987  . Hernia repair 1952    2004  . Cholecystectomy   . Appendectomy   . Thoracotomy     secondary to lung mass  . Insert / replace / remove pacemaker     St Jude unify CD 3231/2010    Current Outpatient Prescriptions  Medication Sig Dispense Refill  . aspirin 81 MG tablet Take 1 tablet (81 mg total) by mouth every other day.      . carvedilol (COREG) 6.25 MG tablet Take 1 tablet (6.25 mg total) by mouth  2 (two) times daily with a meal.  60 tablet  6  . diphenhydramine-acetaminophen (TYLENOL PM) 25-500 MG TABS Take 1 tablet by mouth at bedtime.       . docusate sodium (COLACE) 100 MG capsule Take 100 mg by mouth daily as needed.       . fenofibrate 160 MG tablet TAKE 1 TABLET BY MOUTH EVERY DAY  90 tablet  3  . furosemide (LASIX) 40 MG tablet TAKE ONE TABLET TWICE A DAY  60 tablet  0  . isosorbide mononitrate (IMDUR) 60 MG 24 hr tablet TAKE 1 TABLET BY MOUTH EVERY DAY  90 tablet  2  . losartan (COZAAR) 50 MG tablet 1/2 tab daily      . lubiprostone (AMITIZA) 24 MCG capsule Take 24 mcg by mouth daily as needed.       . NITROSTAT 0.4 MG SL tablet TAKE 1 TABLET BY MOUTH UNDER THE TONGUE EVERY 5 MINUTES AS NEEDED FOR CHEST PAIN MAY REPEAT 3 TIMES  25 tablet  5  . omeprazole (PRILOSEC) 20 MG capsule TAKE 1 CAPSULE BY MOUTH TWICE DAILY  60 capsule  11  . potassium chloride (K-DUR,KLOR-CON) 10 MEQ tablet 2 (two) times daily.       . simvastatin (ZOCOR) 40 MG tablet TAKE ONE TABLET BY MOUTH EVERY NIGHT AT BEDTIME  30 tablet  3  .   Tamsulosin HCl (FLOMAX) 0.4 MG CAPS Take by mouth daily.        . triamcinolone (NASACORT AQ) 55 MCG/ACT nasal inhaler Place 2 sprays into the nose as needed.        . vitamin B-12 (CYANOCOBALAMIN) 500 MCG tablet Take 500 mcg by mouth daily.        . warfarin (COUMADIN) 5 MG tablet TAKE 1 TABLET BY MOUTH AS DIRECTED  30 tablet  3    Allergies  Allergen Reactions  . Carisoprodol     REACTION: rash  . Lisinopril     REACTION: cough  . Penicillins   . Tape     Cloth Tape tears skin  . Zolpidem Tartrate     Review of Systems negative except from HPI and PMH  Physical Exam BP 131/62  Pulse 73  Ht 6' 1" (1.854 m)  Wt 203 lb (92.08 kg)  BMI 26.78 kg/m2 Well developed and well nourished in no acute distress HENT normal E scleral and icterus clear Neck Supple JVP flat; carotids brisk and full Clear to ausculation Regular rate and rhythm with a 3/6 murmur heard at  the left lower sternal border radiating to the apex. There is also a dyskinetic impulse across the precordium. Soft with active bowel sounds No clubbing cyanosis Trac Edema Alert and oriented, grossly normal motor and sensory function Skin Warm and Dry   Assessment and  Plan  

## 2011-04-23 NOTE — Assessment & Plan Note (Signed)
As above  Will increase lasis 40/40>>80/40

## 2011-04-23 NOTE — Patient Instructions (Addendum)
Your physician has requested that you have an echocardiogram. Echocardiography is a painless test that uses sound waves to create images of your heart. It provides your doctor with information about the size and shape of your heart and how well your heart's chambers and valves are working. This procedure takes approximately one hour. There are no restrictions for this procedure.  Your physician has requested that you have a cardiac catheterization. Cardiac catheterization is used to diagnose and/or treat various heart conditions. Doctors may recommend this procedure for a number of different reasons. The most common reason is to evaluate chest pain. Chest pain can be a symptom of coronary artery disease (CAD), and cardiac catheterization can show whether plaque is narrowing or blocking your heart's arteries. This procedure is also used to evaluate the valves, as well as measure the blood flow and oxygen levels in different parts of your heart. For further information please visit [email or web address] Please follow instruction sheet, as given.  You will need to have a coumadin check at our office the morning of your heart catheterization. This should be scheduled no later than 9:00 am.  Your physician has recommended you make the following change in your medication:  1) Increase coreg (carvedilol) to 12.5 mg one tablet by mouth twice daily. 2) Increase lasix (furosemide) to 80 mg in the morning and 40 mg in the evening until your cath next week.

## 2011-04-24 LAB — CBC WITH DIFFERENTIAL/PLATELET
Basophils Absolute: 0 10*3/uL (ref 0.0–0.1)
Eosinophils Relative: 5.9 % — ABNORMAL HIGH (ref 0.0–5.0)
Hemoglobin: 14.1 g/dL (ref 13.0–17.0)
Lymphocytes Relative: 28.1 % (ref 12.0–46.0)
Monocytes Relative: 12.1 % — ABNORMAL HIGH (ref 3.0–12.0)
Neutro Abs: 3 10*3/uL (ref 1.4–7.7)
RDW: 14.6 % (ref 11.5–14.6)
WBC: 5.7 10*3/uL (ref 4.5–10.5)

## 2011-04-24 LAB — BASIC METABOLIC PANEL
Calcium: 9.5 mg/dL (ref 8.4–10.5)
GFR: 43.1 mL/min — ABNORMAL LOW (ref >60.00)
Glucose, Bld: 126 mg/dL — ABNORMAL HIGH (ref 70–99)
Potassium: 3.6 mEq/L (ref 3.5–5.1)
Sodium: 135 mEq/L (ref 135–145)

## 2011-04-24 LAB — PROTIME-INR: Prothrombin Time: 36.6 s — ABNORMAL HIGH (ref 10.2–12.4)

## 2011-04-29 ENCOUNTER — Encounter (HOSPITAL_COMMUNITY): Payer: Self-pay | Admitting: Pharmacy Technician

## 2011-04-30 ENCOUNTER — Encounter (HOSPITAL_COMMUNITY): Payer: Self-pay | Admitting: Cardiology

## 2011-04-30 ENCOUNTER — Encounter (HOSPITAL_COMMUNITY)
Admission: RE | Disposition: A | Discharge status: Home / Self Care | Payer: Self-pay | Source: Ambulatory Visit | Attending: Cardiology

## 2011-04-30 ENCOUNTER — Ambulatory Visit (HOSPITAL_BASED_OUTPATIENT_CLINIC_OR_DEPARTMENT_OTHER)
Admission: RE | Admit: 2011-04-30 | Discharge: 2011-04-30 | Disposition: A | Discharge status: Home / Self Care | Payer: Medicare Other | Source: Ambulatory Visit | Attending: Cardiology | Admitting: Cardiology

## 2011-04-30 ENCOUNTER — Other Ambulatory Visit: Payer: Self-pay

## 2011-04-30 DIAGNOSIS — I2510 Atherosclerotic heart disease of native coronary artery without angina pectoris: Secondary | ICD-10-CM

## 2011-04-30 DIAGNOSIS — R072 Precordial pain: Secondary | ICD-10-CM

## 2011-04-30 DIAGNOSIS — Z950 Presence of cardiac pacemaker: Secondary | ICD-10-CM | POA: Insufficient documentation

## 2011-04-30 DIAGNOSIS — I2589 Other forms of chronic ischemic heart disease: Secondary | ICD-10-CM | POA: Insufficient documentation

## 2011-04-30 DIAGNOSIS — I10 Essential (primary) hypertension: Secondary | ICD-10-CM | POA: Insufficient documentation

## 2011-04-30 DIAGNOSIS — I059 Rheumatic mitral valve disease, unspecified: Secondary | ICD-10-CM | POA: Insufficient documentation

## 2011-04-30 DIAGNOSIS — I5022 Chronic systolic (congestive) heart failure: Secondary | ICD-10-CM | POA: Insufficient documentation

## 2011-04-30 DIAGNOSIS — I509 Heart failure, unspecified: Secondary | ICD-10-CM | POA: Insufficient documentation

## 2011-04-30 DIAGNOSIS — E785 Hyperlipidemia, unspecified: Secondary | ICD-10-CM | POA: Insufficient documentation

## 2011-04-30 DIAGNOSIS — I25810 Atherosclerosis of coronary artery bypass graft(s) without angina pectoris: Secondary | ICD-10-CM | POA: Insufficient documentation

## 2011-04-30 HISTORY — PX: LEFT HEART CATHETERIZATION WITH CORONARY/GRAFT ANGIOGRAM: SHX5450

## 2011-04-30 LAB — BASIC METABOLIC PANEL
BUN: 19 mg/dL (ref 6–23)
CO2: 29 mEq/L (ref 19–32)
Calcium: 9.4 mg/dL (ref 8.4–10.5)
Creatinine, Ser: 1.49 mg/dL — ABNORMAL HIGH (ref 0.50–1.35)
GFR calc non Af Amer: 43 mL/min — ABNORMAL LOW (ref >90)
Glucose, Bld: 100 mg/dL — ABNORMAL HIGH (ref 70–99)
Sodium: 139 mEq/L (ref 135–145)

## 2011-04-30 SURGERY — LEFT HEART CATHETERIZATION WITH CORONARY/GRAFT ANGIOGRAM
Anesthesia: LOCAL

## 2011-04-30 SURGERY — JV LEFT HEART CATHETERIZATION WITH CORONARY ANGIOGRAM
Anesthesia: Moderate Sedation

## 2011-04-30 MED ORDER — FENTANYL CITRATE 0.05 MG/ML IJ SOLN
INTRAMUSCULAR | Status: AC
  Filled 2011-04-30: qty 2

## 2011-04-30 MED ORDER — MIDAZOLAM HCL 2 MG/2ML IJ SOLN
INTRAMUSCULAR | Status: AC
  Filled 2011-04-30: qty 2

## 2011-04-30 MED ORDER — HEPARIN (PORCINE) IN NACL 2-0.9 UNIT/ML-% IJ SOLN
INTRAMUSCULAR | Status: AC
  Filled 2011-04-30: qty 2000

## 2011-04-30 MED ORDER — SODIUM CHLORIDE 0.9 % IV SOLN: 250.0000 mL | INTRAVENOUS | Status: DC | PRN

## 2011-04-30 MED ORDER — NITROGLYCERIN 0.2 MG/ML ON CALL CATH LAB
INTRAVENOUS | Status: AC
  Filled 2011-04-30: qty 1

## 2011-04-30 MED ORDER — SODIUM CHLORIDE 0.9 % IV SOLN
INTRAVENOUS | Status: DC
  Administered 2011-04-30: 06:00:00 via INTRAVENOUS

## 2011-04-30 MED ORDER — ACETAMINOPHEN 325 MG PO TABS: 650.0000 mg | ORAL_TABLET | ORAL | Status: DC | PRN

## 2011-04-30 MED ORDER — SODIUM CHLORIDE 0.9 % IV SOLN: INTRAVENOUS | Status: DC

## 2011-04-30 MED ORDER — POTASSIUM CHLORIDE CRYS ER 20 MEQ PO TBCR
EXTENDED_RELEASE_TABLET | ORAL | Status: AC
  Administered 2011-04-30: 40 meq via ORAL
  Filled 2011-04-30: qty 2

## 2011-04-30 MED ORDER — ONDANSETRON HCL 4 MG/2ML IJ SOLN: 4.0000 mg | Freq: Four times a day (QID) | INTRAMUSCULAR | Status: DC | PRN

## 2011-04-30 MED ORDER — SODIUM CHLORIDE 0.9 % IJ SOLN: 3.0000 mL | Freq: Two times a day (BID) | INTRAMUSCULAR | Status: DC

## 2011-04-30 MED ORDER — ASPIRIN 81 MG PO CHEW
324.0000 mg | CHEWABLE_TABLET | ORAL | Status: AC
  Administered 2011-04-30: 324 mg via ORAL
  Filled 2011-04-30: qty 4
  Filled 2011-04-30: qty 1

## 2011-04-30 MED ORDER — LIDOCAINE HCL (PF) 1 % IJ SOLN
INTRAMUSCULAR | Status: AC
  Filled 2011-04-30: qty 30

## 2011-04-30 MED ORDER — SODIUM CHLORIDE 0.9 % IJ SOLN: 3.0000 mL | INTRAMUSCULAR | Status: DC | PRN

## 2011-04-30 MED ORDER — POTASSIUM CHLORIDE CRYS ER 20 MEQ PO TBCR
40.0000 meq | EXTENDED_RELEASE_TABLET | Freq: Once | ORAL | Status: AC
  Administered 2011-04-30: 40 meq via ORAL

## 2011-04-30 NOTE — CV Procedure (Signed)
   Cardiac Catheterization Procedure Note  Name: Sean [name] MRN: [id number] DOB: [birth year]  Procedure: Left Heart Cath, Selective Coronary Angiography, SVG angiography, SLIMA angiography  Indication: recurrent ICD shocks, new    Procedural details: The patient was brought in early for hydration.  Repeat Cr was 1.4.  K was replaced.  The right groin was prepped, draped, and anesthetized with 1% lidocaine. Using modified Seldinger technique, a 5 French sheath was introduced into the right femoral artery. Standard Judkins catheters were used for coronary angiography and left ventriculography.  An RCA catheter and LIMA catheter were used for graft angiography.   Catheter exchanges were performed over a guidewire. There were no immediate procedural complications. The patient was transferred to the post catheterization recovery area for further monitoring.    Procedural Findings: Hemodynamics:  AO 123/57 (83) LV 119/11 No major gradient.    Coronary angiography: Coronary dominance: right  Left mainstem: The left coronary is heavily calcified.  There is 80% narrowing of the LMCA eccentric leading to the septal.  The LAD is occluded after the large septal.  The large septal also has about 80% disease in the mid portion.  The circumflex is also essentially occluded as before.   Right coronary artery (RCA): not injected.  Known to be totally occluded.   SVG to OM2 has been previously stented.  The stent is patent and the graft is patent, supplying a large OM2(or PLA).  This collateralizes the RCA which is known to be occluded with an occluded SVG.   The LIMA to the LAD/diag is patent.  Both the LAD and diagonal fill nicely.  The apical LAD has plaque and is unchanged from the prior study.    Left ventriculography: not performed because of renal concerns, and the other SVG known from prior studies to be occluded.    Final Conclusions:   1.  Continued patency of the LIMA to the LAD  diagonal 2.  Continued patency of the stent OM2 graft 3.  Known occlusion of the three remaining SVG 4.  Known occlusion of the RCA 5.  Normal LVEDP on current regimen.  6.  Mild moderate progression of the LMCA lesion----supplying a septal which itself has disease.    Recommendations:  1.  Continue medical therapy. 2.  Replace K 3.  Discuss with Dr. Graciela Husbands when he returns.  Not much change in anatomy.  PCI of LMCA could be considered, but he has had no symptoms and seems unlikely source for recurrent shocks.      [name] 04/30/2011, 3:43 PM

## 2011-04-30 NOTE — Interval H&P Note (Signed)
History and Physical Interval Note:  04/30/2011 2:08 PM  Sean [name]  has presented today for surgery, with the diagnosis of chest pain  The various methods of treatment have been discussed with the patient and family. After consideration of risks, benefits and other options for treatment, the patient has consented to  Procedure(s) (LRB): LEFT HEART CATHETERIZATION WITH CORONARY/GRAFT ANGIOGRAM (N/A) as a surgical intervention .  The patients' history has been reviewed, patient examined, no change in status, stable for surgery.  I have reviewed the patients' chart and labs.  Questions were answered to the patient's satisfaction.     [name]  Patient has onset of recurrent discharges from ICD.  No chest pain.  Cr elevated.  I have hydrated him prior to the procedure.  Recath is warranted and he is agreeable to proceed.    Maisie Fus [name] 2:08 PM 04/30/2011

## 2011-04-30 NOTE — Discharge Instructions (Signed)

## 2011-04-30 NOTE — H&P (View-Only) (Signed)
HPI  Sean [name] is a 78 y.o. male followup for ventricular tachycardia in the setting of ischemic heart disease and a prior ICD implantation.  Some tenderness  He underwent CRT upgrade in September 2010   He is seen today because of defibrillator shock therapy. This happened a couple weeks ago. That happened again on weekends. There were associated with loss of consciousness  He has of late noted exertional chest discomfort as well as increasing shortness of breath. There has been no edema. There is an orthopnea or nocturnal dyspnea.  Past week however was checked and had a potassium of 3.4 it was repleted and is now normal.. A magnesium level was not drawn.   Past Medical History  Diagnosis Date  . HYPERLIPIDEMIA 04/07/2008  . HYPERTENSION 04/07/2008  . CAD, ARTERY BYPASS GRAFT 05/22/2008  . CARDIOMYOPATHY, ISCHEMIC 05/22/2008  . MITRAL VALVE PROLAPSE, NON-RHEUMATIC 05/22/2008  . AV BLOCK 05/22/2008  . Chronic systolic heart failure 10/13/2008  . CAROTID ARTERY STENOSIS 11/15/2009  . GERD 04/07/2008  . RENAL INSUFFICIENCY 10/17/2008  . DEGENERATIVE JOINT DISEASE, LEFT KNEE 04/07/2008  . COLONIC POLYPS, HX OF 04/07/2008  . DIVERTICULITIS, HX OF 04/07/2008  . Atrial fibrillation 04/04/2010  . Ischemic heart disease   . Decreased left ventricular function   . Ventricular tachycardia   . CHF (congestive heart failure)     class 2 to 3    Past Surgical History  Procedure Date  . Cardiac surgery     open heart 1991, 1997, pacemaker insertion St Jude 2010 CD 3231  . Lung surgery 1987  . Hernia repair 1952    2004  . Cholecystectomy   . Appendectomy   . Thoracotomy     secondary to lung mass  . Insert / replace / remove pacemaker     St Jude unify CD 3231/2010    Current Outpatient Prescriptions  Medication Sig Dispense Refill  . aspirin 81 MG tablet Take 1 tablet (81 mg total) by mouth every other day.      . carvedilol (COREG) 6.25 MG tablet Take 1 tablet (6.25 mg total) by mouth  2 (two) times daily with a meal.  60 tablet  6  . diphenhydramine-acetaminophen (TYLENOL PM) 25-500 MG TABS Take 1 tablet by mouth at bedtime.       . docusate sodium (COLACE) 100 MG capsule Take 100 mg by mouth daily as needed.       . fenofibrate 160 MG tablet TAKE 1 TABLET BY MOUTH EVERY DAY  90 tablet  3  . furosemide (LASIX) 40 MG tablet TAKE ONE TABLET TWICE A DAY  60 tablet  0  . isosorbide mononitrate (IMDUR) 60 MG 24 hr tablet TAKE 1 TABLET BY MOUTH EVERY DAY  90 tablet  2  . losartan (COZAAR) 50 MG tablet 1/2 tab daily      . lubiprostone (AMITIZA) 24 MCG capsule Take 24 mcg by mouth daily as needed.       Marland Kitchen NITROSTAT 0.4 MG SL tablet TAKE 1 TABLET BY MOUTH UNDER THE TONGUE EVERY 5 MINUTES AS NEEDED FOR CHEST PAIN MAY REPEAT 3 TIMES  25 tablet  5  . omeprazole (PRILOSEC) 20 MG capsule TAKE 1 CAPSULE BY MOUTH TWICE DAILY  60 capsule  11  . potassium chloride (K-DUR,KLOR-CON) 10 MEQ tablet 2 (two) times daily.       . simvastatin (ZOCOR) 40 MG tablet TAKE ONE TABLET BY MOUTH EVERY NIGHT AT BEDTIME  30 tablet  3  .  Tamsulosin HCl (FLOMAX) 0.4 MG CAPS Take by mouth daily.        Marland Kitchen triamcinolone (NASACORT AQ) 55 MCG/ACT nasal inhaler Place 2 sprays into the nose as needed.        . vitamin B-12 (CYANOCOBALAMIN) 500 MCG tablet Take 500 mcg by mouth daily.        Marland Kitchen warfarin (COUMADIN) 5 MG tablet TAKE 1 TABLET BY MOUTH AS DIRECTED  30 tablet  3    Allergies  Allergen Reactions  . Carisoprodol     REACTION: rash  . Lisinopril     REACTION: cough  . Penicillins   . Tape     Cloth Tape tears skin  . Zolpidem Tartrate     Review of Systems negative except from HPI and PMH  Physical Exam BP 131/62  Pulse 73  Ht 6\' 1"  (1.854 m)  Wt 203 lb (92.08 kg)  BMI 26.78 kg/m2 Well developed and well nourished in no acute distress HENT normal E scleral and icterus clear Neck Supple JVP flat; carotids brisk and full Clear to ausculation Regular rate and rhythm with a 3/6 murmur heard at  the left lower sternal border radiating to the apex. There is also a dyskinetic impulse across the precordium. Soft with active bowel sounds No clubbing cyanosis Trac Edema Alert and oriented, grossly normal motor and sensory function Skin Warm and Dry   Assessment and  Plan

## 2011-05-05 ENCOUNTER — Other Ambulatory Visit: Payer: Self-pay | Admitting: Cardiology

## 2011-05-05 ENCOUNTER — Ambulatory Visit (HOSPITAL_COMMUNITY): Payer: Medicare Other | Attending: Internal Medicine

## 2011-05-05 DIAGNOSIS — I10 Essential (primary) hypertension: Secondary | ICD-10-CM | POA: Insufficient documentation

## 2011-05-05 DIAGNOSIS — I472 Ventricular tachycardia: Secondary | ICD-10-CM

## 2011-05-05 DIAGNOSIS — I2510 Atherosclerotic heart disease of native coronary artery without angina pectoris: Secondary | ICD-10-CM | POA: Insufficient documentation

## 2011-05-05 DIAGNOSIS — I4891 Unspecified atrial fibrillation: Secondary | ICD-10-CM

## 2011-05-05 DIAGNOSIS — I080 Rheumatic disorders of both mitral and aortic valves: Secondary | ICD-10-CM | POA: Insufficient documentation

## 2011-05-05 DIAGNOSIS — I079 Rheumatic tricuspid valve disease, unspecified: Secondary | ICD-10-CM | POA: Insufficient documentation

## 2011-05-08 ENCOUNTER — Ambulatory Visit (INDEPENDENT_AMBULATORY_CARE_PROVIDER_SITE_OTHER): Payer: Medicare Other | Admitting: *Deleted

## 2011-05-08 ENCOUNTER — Ambulatory Visit: Payer: Medicare Other | Admitting: Pulmonary Disease

## 2011-05-08 ENCOUNTER — Encounter: Payer: Self-pay | Admitting: Internal Medicine

## 2011-05-08 DIAGNOSIS — Z95810 Presence of automatic (implantable) cardiac defibrillator: Secondary | ICD-10-CM

## 2011-05-08 DIAGNOSIS — I472 Ventricular tachycardia: Secondary | ICD-10-CM

## 2011-05-08 DIAGNOSIS — I4891 Unspecified atrial fibrillation: Secondary | ICD-10-CM

## 2011-05-09 ENCOUNTER — Ambulatory Visit (INDEPENDENT_AMBULATORY_CARE_PROVIDER_SITE_OTHER): Payer: Medicare Other

## 2011-05-09 DIAGNOSIS — I059 Rheumatic mitral valve disease, unspecified: Secondary | ICD-10-CM

## 2011-05-09 DIAGNOSIS — Z7901 Long term (current) use of anticoagulants: Secondary | ICD-10-CM

## 2011-05-09 DIAGNOSIS — I4891 Unspecified atrial fibrillation: Secondary | ICD-10-CM

## 2011-05-09 LAB — REMOTE ICD DEVICE
AL IMPEDENCE ICD: 400 Ohm
BAMS-0001: 150 {beats}/min
BAMS-0003: 70 {beats}/min
HV IMPEDENCE: 44 Ohm
RV LEAD IMPEDENCE ICD: 510 Ohm
TZAT-0004FASTVT: 8
TZAT-0004SLOWVT: 8
TZAT-0012FASTVT: 200 ms
TZAT-0012SLOWVT: 200 ms
TZAT-0013SLOWVT: 3
TZAT-0018FASTVT: NEGATIVE
TZAT-0018SLOWVT: NEGATIVE
TZAT-0019FASTVT: 7.5 V
TZAT-0020FASTVT: 1 ms
TZON-0003FASTVT: 280 ms
TZON-0003SLOWVT: 350 ms
TZON-0004FASTVT: 25
TZON-0005FASTVT: 6
TZON-0010SLOWVT: 80 ms
TZST-0001FASTVT: 3
TZST-0001FASTVT: 4
TZST-0001SLOWVT: 3
TZST-0001SLOWVT: 5
TZST-0003FASTVT: 40 J
TZST-0003FASTVT: 40 J
TZST-0003SLOWVT: 40 J

## 2011-05-09 LAB — POCT INR: INR: 2.3

## 2011-05-12 ENCOUNTER — Other Ambulatory Visit: Payer: Self-pay | Admitting: Family Medicine

## 2011-05-16 ENCOUNTER — Encounter: Payer: Self-pay | Admitting: *Deleted

## 2011-05-19 ENCOUNTER — Ambulatory Visit: Payer: Medicare Other | Admitting: Pulmonary Disease

## 2011-05-22 NOTE — Progress Notes (Signed)
Remote icd check  

## 2011-05-23 ENCOUNTER — Ambulatory Visit: Payer: Medicare Other | Admitting: Internal Medicine

## 2011-05-30 ENCOUNTER — Encounter: Payer: Self-pay | Admitting: Internal Medicine

## 2011-05-30 ENCOUNTER — Ambulatory Visit: Payer: Medicare Other | Admitting: Internal Medicine

## 2011-05-30 ENCOUNTER — Ambulatory Visit (INDEPENDENT_AMBULATORY_CARE_PROVIDER_SITE_OTHER): Payer: Medicare Other | Admitting: Internal Medicine

## 2011-05-30 VITALS — BP 116/56 | HR 58 | Ht 73.0 in | Wt 202.0 lb

## 2011-05-30 DIAGNOSIS — I472 Ventricular tachycardia: Secondary | ICD-10-CM

## 2011-05-30 DIAGNOSIS — I5022 Chronic systolic (congestive) heart failure: Secondary | ICD-10-CM

## 2011-05-30 DIAGNOSIS — I2589 Other forms of chronic ischemic heart disease: Secondary | ICD-10-CM

## 2011-05-30 DIAGNOSIS — I4891 Unspecified atrial fibrillation: Secondary | ICD-10-CM

## 2011-05-30 DIAGNOSIS — Z95810 Presence of automatic (implantable) cardiac defibrillator: Secondary | ICD-10-CM

## 2011-05-30 LAB — ICD DEVICE OBSERVATION
ATRIAL PACING ICD: 37 pct
BAMS-0003: 70 {beats}/min
DEVICE MODEL ICD: 726856
LV LEAD IMPEDENCE ICD: 350 Ohm
LV LEAD THRESHOLD: 2.75 V
RV LEAD IMPEDENCE ICD: 450 Ohm
RV LEAD THRESHOLD: 1 V
TZAT-0001FASTVT: 1
TZAT-0001SLOWVT: 1
TZAT-0004FASTVT: 8
TZAT-0012FASTVT: 200 ms
TZAT-0012SLOWVT: 200 ms
TZAT-0013FASTVT: 1
TZAT-0018FASTVT: NEGATIVE
TZAT-0018SLOWVT: NEGATIVE
TZAT-0019SLOWVT: 7.5 V
TZAT-0020SLOWVT: 1 ms
TZON-0003FASTVT: 280 ms
TZON-0005FASTVT: 6
TZON-0005SLOWVT: 6
TZST-0001FASTVT: 2
TZST-0001FASTVT: 5
TZST-0001SLOWVT: 4
TZST-0003FASTVT: 36 J
TZST-0003FASTVT: 40 J
TZST-0003FASTVT: 40 J
TZST-0003SLOWVT: 36 J
TZST-0003SLOWVT: 40 J

## 2011-05-30 NOTE — Assessment & Plan Note (Signed)
The patient's device was interrogated.  The information was reviewed. No changes were made in the programming.    

## 2011-05-30 NOTE — Patient Instructions (Signed)
Remote monitoring is used to monitor your Pacemaker of ICD from home. This monitoring reduces the number of office visits required to check your device to one time per year. It allows [place] to keep an eye on the functioning of your device to ensure it is working properly. You are scheduled for a device check from home on September 04, 2011. You may send your transmission at any time that day. If you have a wireless device, the transmission will be sent automatically. After your physician reviews your transmission, you will receive a postcard with your next transmission date.

## 2011-05-30 NOTE — Progress Notes (Signed)
HPI  Sean [name] is a 78 y.o. male followup for ventricular tachycardia in the setting of ischemic heart disease and a prior ICD implantation.  Some tenderness  He underwent CRT upgrade in September 2010   He is seen today because of defibrillator shock therapy. This happened a couple weeks ago. That happened again on weekends. There were associated with loss of consciousness  He has of late noted exertional chest discomfort as well as increasing shortness of breath. There has been no edema. There is an orthopnea or nocturnal dyspnea.    Because of the above he underwent catheterization that demonstrated>>> Continued patency of the LIMA to the LAD diagonal  2. Continued patency of the stent OM2 graft  3. Known occlusion of the three remaining SVG  4. Known occlusion of the RCA  5. Normal LVEDP on current regimen.  6. Mild moderate progression of the LMCA lesion----supplying a septal which itself has disease. ed no significant progression of his coronary disease.   He has lived in fear of the next episode. He has not walking. He is quite depressed. He is driving his lawnmower.    Allergies  Allergen Reactions  . Carisoprodol     REACTION: rash  . Lisinopril     REACTION: cough  . Tape     Cloth Tape tears skin  . Zolpidem Tartrate Other (See Comments)    Altered mental status  . Penicillins Rash    Review of Systems negative except from HPI and PMH  Physical Exam Well developed and well nourished in no acute distress HENT normal E scleral and icterus clear Neck Supple JVP flat; carotids brisk and full Clear to ausculation Regular rate and rhythm, no murmurs gallops or rub Soft with active bowel sounds No clubbing cyanosis and edema Alert and oriented, grossly normal motor and sensory function Skin Warm and Dry    Assessment and  Plan   Past Medical History  Diagnosis Date  . HYPERLIPIDEMIA 04/07/2008  . HYPERTENSION 04/07/2008  . CAD, ARTERY BYPASS GRAFT  05/22/2008  . CARDIOMYOPATHY, ISCHEMIC 05/22/2008  . MITRAL VALVE PROLAPSE, NON-RHEUMATIC 05/22/2008  . AV BLOCK 05/22/2008  . Chronic systolic heart failure 10/13/2008  . CAROTID ARTERY STENOSIS 11/15/2009  . GERD 04/07/2008  . RENAL INSUFFICIENCY 10/17/2008  . DEGENERATIVE JOINT DISEASE, LEFT KNEE 04/07/2008  . COLONIC POLYPS, HX OF 04/07/2008  . DIVERTICULITIS, HX OF 04/07/2008  . Atrial fibrillation 04/04/2010  . Ischemic heart disease   . Decreased left ventricular function   . Ventricular tachycardia   . CHF (congestive heart failure)     class 2 to 3    Past Surgical History  Procedure Date  . Cardiac surgery     open heart 1991, 1997, pacemaker insertion St Jude 2010 CD 3231  . Lung surgery 1987  . Hernia repair 1952    2004  . Cholecystectomy   . Appendectomy   . Thoracotomy     secondary to lung mass  . Insert / replace / remove pacemaker     St Jude unify CD 3231/2010    Current Outpatient Prescriptions  Medication Sig Dispense Refill  . aspirin 81 MG tablet Take 1 tablet (81 mg total) by mouth every other day.      . carvedilol (COREG) 12.5 MG tablet Take 12.5 mg by mouth 2 (two) times daily with a meal.      . diphenhydramine-acetaminophen (TYLENOL PM) 25-500 MG TABS Take 1 tablet by mouth at bedtime.       Marland Kitchen  docusate sodium (COLACE) 100 MG capsule Take 100 mg by mouth daily as needed. For constipation      . fenofibrate 160 MG tablet Take 160 mg by mouth daily.      . furosemide (LASIX) 40 MG tablet Take 40-80 mg by mouth 2 (two) times daily. 80 mg every morning and 40 mg every afternoon      . isosorbide mononitrate (IMDUR) 60 MG 24 hr tablet Take 60 mg by mouth daily.      Marland Kitchen losartan (COZAAR) 50 MG tablet Take 25 mg by mouth daily.       Marland Kitchen lubiprostone (AMITIZA) 24 MCG capsule Take 24 mcg by mouth daily as needed. For constipation      . nitroGLYCERIN (NITROSTAT) 0.4 MG SL tablet Place 0.4 mg under the tongue every 5 (five) minutes as needed. For chest pain      .  omeprazole (PRILOSEC) 20 MG capsule TAKE 1 CAPSULE BY MOUTH TWICE DAILY  60 capsule  11  . potassium chloride (K-DUR,KLOR-CON) 10 MEQ tablet Take 1 tablet (10 mEq total) by mouth 2 (two) times daily.  60 tablet  3  . simvastatin (ZOCOR) 40 MG tablet TAKE ONE TABLET BY MOUTH EVERY NIGHT AT BEDTIME  30 tablet  11  . Tamsulosin HCl (FLOMAX) 0.4 MG CAPS Take 0.4 mg by mouth daily.       . vitamin B-12 (CYANOCOBALAMIN) 500 MCG tablet Take 500 mcg by mouth daily.        Marland Kitchen warfarin (COUMADIN) 5 MG tablet Take 2.5-5 mg by mouth daily. 2.5 mg sun tue and thur, 5mg  all other days      . DISCONTD: omeprazole (PRILOSEC) 20 MG capsule Take 20 mg by mouth 2 (two) times daily.      Marland Kitchen DISCONTD: simvastatin (ZOCOR) 40 MG tablet Take 40 mg by mouth every evening.      Marland Kitchen DISCONTD: triamcinolone (NASACORT AQ) 55 MCG/ACT nasal inhaler Place 2 sprays into the nose as needed.          Allergies  Allergen Reactions  . Carisoprodol     REACTION: rash  . Lisinopril     REACTION: cough  . Tape     Cloth Tape tears skin  . Zolpidem Tartrate Other (See Comments)    Altered mental status  . Penicillins Rash    Review of Systems negative except from HPI and PMH  Physical Exam BP 116/56  Pulse 58  Ht 6\' 1"  (1.854 m)  Wt 202 lb (91.627 kg)  BMI 26.65 kg/m2 Well developed and well nourished in no acute distress HENT normal E scleral and icterus clear Neck Supple JVP flat; carotids brisk and full Clear to ausculation Regular rate and rhythm, PMI is dyskinetic and displaced and a 3/6 murmur heard at the apex Soft with active bowel sounds No clubbing cyanosis none Edema Alert and oriented, grossly normal motor and sensory function Skin Warm and Dry   Assessment and  Plan

## 2011-05-30 NOTE — Assessment & Plan Note (Signed)
Stable on current meds 

## 2011-05-30 NOTE — Assessment & Plan Note (Signed)
Continued medical therapy. Recent catheterization demonstrated patent grafts

## 2011-05-30 NOTE — Assessment & Plan Note (Signed)
The patient has had recurrent symptomatic/syncopal monomorphic ventricular tachycardia treated by his device. No clear trigger is evident. We had a long long discussion today regarding treatment options including ongoing use of beta blockers, antiarrhythmic drug therapy with her potential for proarrhythmia and other side effects, as well as catheter ablation. The definitive albeit limited results associated with catheter ablation are quite appealing to the family. We'll discuss this with Dr. Johney Frame

## 2011-05-30 NOTE — Assessment & Plan Note (Signed)
No intercurrent ventricular tachycardia or atrial fibrillation

## 2011-06-03 ENCOUNTER — Telehealth: Payer: Self-pay | Admitting: Internal Medicine

## 2011-06-03 NOTE — Telephone Encounter (Signed)
Pt's wife calling re procedure to be set up for pt, hasn't heard from [place] re the date , has it been set up yet?

## 2011-06-03 NOTE — Telephone Encounter (Signed)
Pt's wife calling wanting to know if date set up to check wiring on defibriltor--advised i did not see anything scheduled,but kelly and dr allred back tomorrow 5/1 and i will have kelly give her a call--pt agrees--nt

## 2011-06-04 NOTE — Telephone Encounter (Signed)
lmom for pt that this is the only day Dr Johney Frame can do procedure

## 2011-06-04 NOTE — Telephone Encounter (Signed)
Pt's wife calling re procedure date of 5-23, she said she was told it would be 5-6, pls call [phone]

## 2011-06-04 NOTE — Telephone Encounter (Signed)
lmom for pt that I had scheduled procedure on 06/26/11  Labs 06/19/11

## 2011-06-05 NOTE — Telephone Encounter (Signed)
Fu call °Patient returning your call °

## 2011-06-10 ENCOUNTER — Ambulatory Visit (INDEPENDENT_AMBULATORY_CARE_PROVIDER_SITE_OTHER): Payer: Medicare Other | Admitting: Cardiology

## 2011-06-10 ENCOUNTER — Encounter: Payer: Self-pay | Admitting: Cardiology

## 2011-06-10 VITALS — BP 118/52 | HR 70 | Ht 73.0 in | Wt 203.8 lb

## 2011-06-10 DIAGNOSIS — I059 Rheumatic mitral valve disease, unspecified: Secondary | ICD-10-CM

## 2011-06-10 DIAGNOSIS — I4891 Unspecified atrial fibrillation: Secondary | ICD-10-CM

## 2011-06-10 DIAGNOSIS — I2589 Other forms of chronic ischemic heart disease: Secondary | ICD-10-CM

## 2011-06-10 DIAGNOSIS — I25810 Atherosclerosis of coronary artery bypass graft(s) without angina pectoris: Secondary | ICD-10-CM

## 2011-06-10 DIAGNOSIS — I472 Ventricular tachycardia: Secondary | ICD-10-CM

## 2011-06-10 LAB — BASIC METABOLIC PANEL
BUN: 20 mg/dL (ref 6–23)
CO2: 29 mEq/L (ref 19–32)
Chloride: 103 mEq/L (ref 96–112)
Creatinine, Ser: 1.8 mg/dL — ABNORMAL HIGH (ref 0.4–1.5)
Glucose, Bld: 113 mg/dL — ABNORMAL HIGH (ref 70–99)

## 2011-06-10 NOTE — Assessment & Plan Note (Signed)
See my recent cath report.  There are several potential sources of ischemia, but non easily approachable.  Ischemic symptoms have not preceded his discharges.  Continued medical therapy.

## 2011-06-10 NOTE — Assessment & Plan Note (Signed)
See under VT.  Continue current regimen.  Will check BMET today.

## 2011-06-10 NOTE — Assessment & Plan Note (Signed)
See pressures from cath.  Currently on the appopriate regimen.

## 2011-06-10 NOTE — Assessment & Plan Note (Signed)
For consideration of VT ablation.

## 2011-06-10 NOTE — Patient Instructions (Signed)
Your physician recommends that you have lab work today: [facility]  Your physician recommends that you schedule a follow-up appointment in: 6 WEEKS with Dr [name]  Your physician recommends that you continue on your current medications as directed. Please refer to the Current Medication list given to you today.

## 2011-06-10 NOTE — Progress Notes (Signed)
HPI:  Sean [name]  returns for followup. He has seen Dr. Graciela Husbands since I last saw him. They have tentatively set him up for a ventricular tachycardia ablation.  Since he underwent cardiac catheterization he has not had any evidence of recurrent ventricular tachycardia by history.  We reviewed the films today with the patient and his wife.  He does get some angina, and this is explained in part by the films.  See report below.     Current Outpatient Prescriptions  Medication Sig Dispense Refill  . aspirin 81 MG tablet Take 1 tablet (81 mg total) by mouth every other day.      . carvedilol (COREG) 12.5 MG tablet Take 12.5 mg by mouth 2 (two) times daily with a meal.      . diphenhydramine-acetaminophen (TYLENOL PM) 25-500 MG TABS Take 1 tablet by mouth at bedtime.       . docusate sodium (COLACE) 100 MG capsule Take 100 mg by mouth daily as needed. For constipation      . fenofibrate 160 MG tablet Take 160 mg by mouth daily.      . furosemide (LASIX) 40 MG tablet Take 40-80 mg by mouth 2 (two) times daily. 80 mg every morning and 40 mg every afternoon      . isosorbide mononitrate (IMDUR) 60 MG 24 hr tablet Take 60 mg by mouth daily.      Marland Kitchen losartan (COZAAR) 50 MG tablet Take 25 mg by mouth daily.       Marland Kitchen lubiprostone (AMITIZA) 24 MCG capsule Take 24 mcg by mouth daily as needed. For constipation      . nitroGLYCERIN (NITROSTAT) 0.4 MG SL tablet Place 0.4 mg under the tongue every 5 (five) minutes as needed. For chest pain      . omeprazole (PRILOSEC) 20 MG capsule TAKE 1 CAPSULE BY MOUTH TWICE DAILY  60 capsule  11  . potassium chloride (K-DUR,KLOR-CON) 10 MEQ tablet Take 1 tablet (10 mEq total) by mouth 2 (two) times daily.  60 tablet  3  . simvastatin (ZOCOR) 40 MG tablet TAKE ONE TABLET BY MOUTH EVERY NIGHT AT BEDTIME  30 tablet  11  . Tamsulosin HCl (FLOMAX) 0.4 MG CAPS Take 0.4 mg by mouth daily.       . vitamin B-12 (CYANOCOBALAMIN) 500 MCG tablet Take 500 mcg by mouth daily.        Marland Kitchen  warfarin (COUMADIN) 5 MG tablet Take 2.5-5 mg by mouth daily. 2.5 mg sun tue and thur, 5mg  all other days      . DISCONTD: triamcinolone (NASACORT AQ) 55 MCG/ACT nasal inhaler Place 2 sprays into the nose as needed.          Allergies  Allergen Reactions  . Carisoprodol     REACTION: rash  . Lisinopril     REACTION: cough  . Tape     Cloth Tape tears skin  . Zolpidem Tartrate Other (See Comments)    Altered mental status  . Penicillins Rash    Past Medical History  Diagnosis Date  . HYPERLIPIDEMIA 04/07/2008  . HYPERTENSION 04/07/2008  . CAD, ARTERY BYPASS GRAFT 05/22/2008  . CARDIOMYOPATHY, ISCHEMIC 05/22/2008  . MITRAL VALVE PROLAPSE, NON-RHEUMATIC 05/22/2008  . AV BLOCK 05/22/2008  . Chronic systolic heart failure 10/13/2008  . CAROTID ARTERY STENOSIS 11/15/2009  . GERD 04/07/2008  . RENAL INSUFFICIENCY 10/17/2008  . DEGENERATIVE JOINT DISEASE, LEFT KNEE 04/07/2008  . COLONIC POLYPS, HX OF 04/07/2008  . DIVERTICULITIS, HX OF  04/07/2008  . Atrial fibrillation 04/04/2010  . Ischemic heart disease   . Decreased left ventricular function   . Ventricular tachycardia   . CHF (congestive heart failure)     class 2 to 3    Past Surgical History  Procedure Date  . Cardiac surgery     open heart 1991, 1997, pacemaker insertion St Jude 2010 CD 3231  . Lung surgery 1987  . Hernia repair 1952    2004  . Cholecystectomy   . Appendectomy   . Thoracotomy     secondary to lung mass  . Insert / replace / remove pacemaker     St Jude unify CD 3231/2010    Family History  Problem Relation Age of Onset  . Heart disease Mother     CAD, deceased   . Coronary artery disease Mother   . Heart disease Sister   . Heart disease Brother     History   Social History  . Marital Status: Married    Spouse Name: N/A    Number of Children: 2  . Years of Education: N/A   Occupational History  .     Social History Main Topics  . Smoking status: Former Smoker -- 0.5 packs/day for 30 years     Types: Cigarettes    Quit date: 04/25/1974  . Smokeless tobacco: Never Used  . Alcohol Use: No  . Drug Use: No  . Sexually Active: Not on file   Other Topics Concern  . Not on file   Social History Narrative  . No narrative on file    ROS: Please see the HPI.  All other systems reviewed and negative.  PHYSICAL EXAM:  BP 118/52  Pulse 70  Ht 6\' 1"  (1.854 m)  Wt 203 lb 12.8 oz (92.443 kg)  BMI 26.89 kg/m2  General: Well developed, well nourished, in no acute distress. Head:  Normocephalic and atraumatic. Neck: no JVD Lungs: Clear to auscultation and percussion. Heart: Normal S1 and S2.  Apical holosystolic murmur.   Abdomen:  Normal bowel sounds; soft; non tender; no organomegaly Pulses: Pulses normal in all 4 extremities. Extremities: No clubbing or cyanosis. Trace  edema. Neurologic: Alert and oriented x 3.  EKG:  NSR.  V pacing.    CATH REPORT:  Procedural Findings:  Hemodynamics:  AO 123/57 (83)  LV 119/11  No major gradient.  Coronary angiography:  Coronary dominance: right  Left mainstem: The left coronary is heavily calcified. There is 80% narrowing of the LMCA eccentric leading to the septal. The LAD is occluded after the large septal. The large septal also has about 80% disease in the mid portion. The circumflex is also essentially occluded as before.  Right coronary artery (RCA): not injected. Known to be totally occluded.  SVG to OM2 has been previously stented. The stent is patent and the graft is patent, supplying a large OM2(or PLA). This collateralizes the RCA which is known to be occluded with an occluded SVG.  The LIMA to the LAD/diag is patent. Both the LAD and diagonal fill nicely. The apical LAD has plaque and is unchanged from the prior study.  Left ventriculography: not performed because of renal concerns, and the other SVG known from prior studies to be occluded.  Final Conclusions:  1. Continued patency of the LIMA to the LAD diagonal  2.  Continued patency of the stent OM2 graft  3. Known occlusion of the three remaining SVG  4. Known occlusion of the RCA  5. Normal LVEDP on current regimen.  6. Mild moderate progression of the LMCA lesion----supplying a septal which itself has disease.  Recommendations:  1. Continue medical therapy.  2. Replace K  3. Discuss with Dr. Graciela Husbands when he returns. Not much change in anatomy. PCI of LMCA could be considered, but he has had no symptoms and seems unlikely source for recurrent shocks.      ASSESSMENT AND PLAN:

## 2011-06-11 ENCOUNTER — Other Ambulatory Visit: Payer: Self-pay | Admitting: *Deleted

## 2011-06-11 ENCOUNTER — Encounter: Payer: Self-pay | Admitting: *Deleted

## 2011-06-11 DIAGNOSIS — I472 Ventricular tachycardia: Secondary | ICD-10-CM

## 2011-06-11 NOTE — Telephone Encounter (Signed)
Patient aware of time for labs and ablation.  Will pick up instruction sheet on 06/19/11

## 2011-06-13 ENCOUNTER — Other Ambulatory Visit: Payer: Self-pay | Admitting: *Deleted

## 2011-06-16 ENCOUNTER — Encounter (HOSPITAL_COMMUNITY): Payer: Self-pay | Admitting: Respiratory Therapy

## 2011-06-16 ENCOUNTER — Other Ambulatory Visit: Payer: Self-pay | Admitting: Cardiology

## 2011-06-17 ENCOUNTER — Other Ambulatory Visit: Payer: Self-pay | Admitting: Cardiology

## 2011-06-19 ENCOUNTER — Ambulatory Visit (INDEPENDENT_AMBULATORY_CARE_PROVIDER_SITE_OTHER): Payer: Medicare Other

## 2011-06-19 ENCOUNTER — Other Ambulatory Visit (INDEPENDENT_AMBULATORY_CARE_PROVIDER_SITE_OTHER): Payer: Medicare Other

## 2011-06-19 ENCOUNTER — Other Ambulatory Visit: Payer: Self-pay | Admitting: *Deleted

## 2011-06-19 ENCOUNTER — Other Ambulatory Visit: Payer: Self-pay | Admitting: Cardiology

## 2011-06-19 DIAGNOSIS — Z7901 Long term (current) use of anticoagulants: Secondary | ICD-10-CM

## 2011-06-19 DIAGNOSIS — E876 Hypokalemia: Secondary | ICD-10-CM

## 2011-06-19 DIAGNOSIS — I472 Ventricular tachycardia, unspecified: Secondary | ICD-10-CM

## 2011-06-19 DIAGNOSIS — I4891 Unspecified atrial fibrillation: Secondary | ICD-10-CM

## 2011-06-19 DIAGNOSIS — I059 Rheumatic mitral valve disease, unspecified: Secondary | ICD-10-CM

## 2011-06-19 LAB — CBC WITH DIFFERENTIAL/PLATELET
Basophils Relative: 0.8 % (ref 0.0–3.0)
Eosinophils Relative: 5.5 % — ABNORMAL HIGH (ref 0.0–5.0)
Lymphocytes Relative: 29.2 % (ref 12.0–46.0)
MCV: 88 fl (ref 78.0–100.0)
Monocytes Relative: 14.3 % — ABNORMAL HIGH (ref 3.0–12.0)
Neutrophils Relative %: 50.2 % (ref 43.0–77.0)
Platelets: 140 10*3/uL — ABNORMAL LOW (ref 150.0–400.0)
RBC: 4.6 Mil/uL (ref 4.22–5.81)
WBC: 5.5 10*3/uL (ref 4.5–10.5)

## 2011-06-19 LAB — BASIC METABOLIC PANEL
BUN: 23 mg/dL (ref 6–23)
CO2: 29 mEq/L (ref 19–32)
Chloride: 102 mEq/L (ref 96–112)
GFR: 40.79 mL/min — ABNORMAL LOW (ref >60.00)
Glucose, Bld: 91 mg/dL (ref 70–99)
Potassium: 3.2 mEq/L — ABNORMAL LOW (ref 3.5–5.1)
Sodium: 140 mEq/L (ref 135–145)

## 2011-06-19 MED ORDER — POTASSIUM CHLORIDE CRYS ER 20 MEQ PO TBCR: 20.0000 meq | EXTENDED_RELEASE_TABLET | Freq: Two times a day (BID) | ORAL | Status: DC

## 2011-06-19 MED ORDER — FUROSEMIDE 40 MG PO TABS: 40.0000 mg | ORAL_TABLET | ORAL | Status: DC

## 2011-06-19 MED ORDER — FUROSEMIDE 40 MG PO TABS: ORAL_TABLET | ORAL | Status: DC

## 2011-06-20 ENCOUNTER — Other Ambulatory Visit: Payer: Self-pay | Admitting: *Deleted

## 2011-06-20 MED ORDER — FUROSEMIDE 40 MG PO TABS: ORAL_TABLET | ORAL | Status: DC

## 2011-06-25 ENCOUNTER — Other Ambulatory Visit (INDEPENDENT_AMBULATORY_CARE_PROVIDER_SITE_OTHER): Payer: Medicare Other

## 2011-06-25 DIAGNOSIS — E876 Hypokalemia: Secondary | ICD-10-CM

## 2011-06-25 LAB — BASIC METABOLIC PANEL
CO2: 27 mEq/L (ref 19–32)
Calcium: 9.4 mg/dL (ref 8.4–10.5)
Chloride: 102 mEq/L (ref 96–112)
Creatinine, Ser: 1.9 mg/dL — ABNORMAL HIGH (ref 0.4–1.5)
Glucose, Bld: 132 mg/dL — ABNORMAL HIGH (ref 70–99)

## 2011-06-26 ENCOUNTER — Encounter (HOSPITAL_COMMUNITY): Payer: Self-pay | Admitting: *Deleted

## 2011-06-26 ENCOUNTER — Ambulatory Visit (HOSPITAL_COMMUNITY)
Admission: RE | Admit: 2011-06-26 | Discharge: 2011-06-27 | Disposition: A | Discharge status: Home / Self Care | Payer: Medicare Other | Source: Ambulatory Visit | Attending: Internal Medicine | Admitting: Internal Medicine

## 2011-06-26 ENCOUNTER — Encounter (HOSPITAL_COMMUNITY): Payer: Self-pay | Admitting: Anesthesiology

## 2011-06-26 ENCOUNTER — Encounter (HOSPITAL_COMMUNITY)
Admission: RE | Disposition: A | Discharge status: Home / Self Care | Payer: Self-pay | Source: Ambulatory Visit | Attending: Internal Medicine

## 2011-06-26 ENCOUNTER — Ambulatory Visit (HOSPITAL_COMMUNITY): Payer: Medicare Other | Admitting: Anesthesiology

## 2011-06-26 DIAGNOSIS — I2510 Atherosclerotic heart disease of native coronary artery without angina pectoris: Secondary | ICD-10-CM | POA: Insufficient documentation

## 2011-06-26 DIAGNOSIS — I442 Atrioventricular block, complete: Secondary | ICD-10-CM | POA: Insufficient documentation

## 2011-06-26 DIAGNOSIS — I25700 Atherosclerosis of coronary artery bypass graft(s), unspecified, with unstable angina pectoris: Secondary | ICD-10-CM | POA: Insufficient documentation

## 2011-06-26 DIAGNOSIS — I4891 Unspecified atrial fibrillation: Secondary | ICD-10-CM

## 2011-06-26 DIAGNOSIS — Z95810 Presence of automatic (implantable) cardiac defibrillator: Secondary | ICD-10-CM | POA: Insufficient documentation

## 2011-06-26 DIAGNOSIS — I472 Ventricular tachycardia, unspecified: Secondary | ICD-10-CM | POA: Insufficient documentation

## 2011-06-26 DIAGNOSIS — E785 Hyperlipidemia, unspecified: Secondary | ICD-10-CM | POA: Insufficient documentation

## 2011-06-26 DIAGNOSIS — I5022 Chronic systolic (congestive) heart failure: Secondary | ICD-10-CM | POA: Insufficient documentation

## 2011-06-26 DIAGNOSIS — I10 Essential (primary) hypertension: Secondary | ICD-10-CM | POA: Insufficient documentation

## 2011-06-26 DIAGNOSIS — I4729 Other ventricular tachycardia: Secondary | ICD-10-CM | POA: Insufficient documentation

## 2011-06-26 DIAGNOSIS — I5043 Acute on chronic combined systolic (congestive) and diastolic (congestive) heart failure: Secondary | ICD-10-CM | POA: Insufficient documentation

## 2011-06-26 DIAGNOSIS — N289 Disorder of kidney and ureter, unspecified: Secondary | ICD-10-CM | POA: Insufficient documentation

## 2011-06-26 DIAGNOSIS — I2589 Other forms of chronic ischemic heart disease: Secondary | ICD-10-CM | POA: Insufficient documentation

## 2011-06-26 DIAGNOSIS — I509 Heart failure, unspecified: Secondary | ICD-10-CM | POA: Insufficient documentation

## 2011-06-26 HISTORY — PX: V-TACH ABLATION: SHX5498

## 2011-06-26 LAB — POCT ACTIVATED CLOTTING TIME
Activated Clotting Time: 199 seconds
Activated Clotting Time: 199 seconds

## 2011-06-26 LAB — PROTIME-INR: INR: 2.09 — ABNORMAL HIGH (ref 0.00–1.49)

## 2011-06-26 SURGERY — V-TACH ABLATION
Anesthesia: Monitor Anesthesia Care

## 2011-06-26 MED ORDER — WARFARIN SODIUM 5 MG PO TABS
5.0000 mg | ORAL_TABLET | ORAL | Status: DC
  Filled 2011-06-26: qty 1

## 2011-06-26 MED ORDER — WARFARIN - PHYSICIAN DOSING INPATIENT: Freq: Every day | Status: DC

## 2011-06-26 MED ORDER — CARVEDILOL 12.5 MG PO TABS
12.5000 mg | ORAL_TABLET | Freq: Two times a day (BID) | ORAL | Status: DC
  Administered 2011-06-26 – 2011-06-27 (×2): 12.5 mg via ORAL
  Filled 2011-06-26 (×4): qty 1

## 2011-06-26 MED ORDER — ACETAMINOPHEN 500 MG PO TABS
500.0000 mg | ORAL_TABLET | Freq: Every day | ORAL | Status: DC
  Administered 2011-06-26: 500 mg via ORAL
  Filled 2011-06-26 (×2): qty 1

## 2011-06-26 MED ORDER — PHENYLEPHRINE HCL 10 MG/ML IJ SOLN
10.0000 mg | INTRAVENOUS | Status: DC | PRN
  Administered 2011-06-26: 10 ug/min via INTRAVENOUS

## 2011-06-26 MED ORDER — LOSARTAN POTASSIUM 25 MG PO TABS
25.0000 mg | ORAL_TABLET | Freq: Every day | ORAL | Status: DC
  Administered 2011-06-27: 25 mg via ORAL
  Filled 2011-06-26: qty 1

## 2011-06-26 MED ORDER — SODIUM CHLORIDE 0.9 % IV SOLN
INTRAVENOUS | Status: DC
  Administered 2011-06-26: 06:00:00 via INTRAVENOUS

## 2011-06-26 MED ORDER — SODIUM CHLORIDE 0.9 % IJ SOLN: 3.0000 mL | INTRAMUSCULAR | Status: DC | PRN

## 2011-06-26 MED ORDER — ACETAMINOPHEN 325 MG PO TABS: 650.0000 mg | ORAL_TABLET | ORAL | Status: DC | PRN

## 2011-06-26 MED ORDER — FENOFIBRATE 160 MG PO TABS
160.0000 mg | ORAL_TABLET | Freq: Every day | ORAL | Status: DC
  Administered 2011-06-26 – 2011-06-27 (×2): 160 mg via ORAL
  Filled 2011-06-26 (×2): qty 1

## 2011-06-26 MED ORDER — LIDOCAINE-EPINEPHRINE 1 %-1:100000 IJ SOLN
INTRAMUSCULAR | Status: AC
  Filled 2011-06-26: qty 1

## 2011-06-26 MED ORDER — POTASSIUM CHLORIDE CRYS ER 20 MEQ PO TBCR
20.0000 meq | EXTENDED_RELEASE_TABLET | Freq: Two times a day (BID) | ORAL | Status: DC
  Administered 2011-06-26 – 2011-06-27 (×2): 20 meq via ORAL
  Filled 2011-06-26 (×3): qty 1

## 2011-06-26 MED ORDER — HEPARIN SODIUM (PORCINE) 1000 UNIT/ML IJ SOLN
INTRAMUSCULAR | Status: DC | PRN
  Administered 2011-06-26: 4 mL via INTRAVENOUS
  Administered 2011-06-26: 3 mL via INTRAVENOUS
  Administered 2011-06-26: 5 mL via INTRAVENOUS

## 2011-06-26 MED ORDER — FUROSEMIDE 40 MG PO TABS
40.0000 mg | ORAL_TABLET | Freq: Every day | ORAL | Status: DC
  Administered 2011-06-26 – 2011-06-27 (×2): 40 mg via ORAL
  Filled 2011-06-26 (×3): qty 1

## 2011-06-26 MED ORDER — WARFARIN SODIUM 2.5 MG PO TABS: 2.5000 mg | ORAL_TABLET | Freq: Every day | ORAL | Status: DC

## 2011-06-26 MED ORDER — PHENYLEPHRINE HCL 10 MG/ML IJ SOLN
INTRAMUSCULAR | Status: DC | PRN
  Administered 2011-06-26: 120 ug via INTRAVENOUS
  Administered 2011-06-26: 160 ug via INTRAVENOUS
  Administered 2011-06-26: 320 ug via INTRAVENOUS

## 2011-06-26 MED ORDER — FENTANYL CITRATE 0.05 MG/ML IJ SOLN
INTRAMUSCULAR | Status: DC | PRN
  Administered 2011-06-26: 25 ug via INTRAVENOUS
  Administered 2011-06-26: 50 ug via INTRAVENOUS

## 2011-06-26 MED ORDER — SODIUM CHLORIDE 0.9 % IJ SOLN
3.0000 mL | Freq: Two times a day (BID) | INTRAMUSCULAR | Status: DC
  Administered 2011-06-26 – 2011-06-27 (×2): 3 mL via INTRAVENOUS

## 2011-06-26 MED ORDER — LIDOCAINE HCL 1 % IJ SOLN
3.0000 mL | Freq: Once | INTRAMUSCULAR | Status: AC
  Administered 2011-06-26: 3 mL

## 2011-06-26 MED ORDER — LUBIPROSTONE 24 MCG PO CAPS
24.0000 ug | ORAL_CAPSULE | Freq: Every day | ORAL | Status: DC | PRN
  Filled 2011-06-26: qty 1

## 2011-06-26 MED ORDER — WARFARIN SODIUM 2.5 MG PO TABS
2.5000 mg | ORAL_TABLET | ORAL | Status: DC
  Administered 2011-06-26: 2.5 mg via ORAL
  Filled 2011-06-26: qty 1

## 2011-06-26 MED ORDER — BUPIVACAINE HCL (PF) 0.25 % IJ SOLN
INTRAMUSCULAR | Status: AC
  Filled 2011-06-26: qty 60

## 2011-06-26 MED ORDER — ISOSORBIDE MONONITRATE ER 60 MG PO TB24
60.0000 mg | ORAL_TABLET | Freq: Every day | ORAL | Status: DC
  Administered 2011-06-27: 60 mg via ORAL
  Filled 2011-06-26: qty 1

## 2011-06-26 MED ORDER — SIMVASTATIN 40 MG PO TABS
40.0000 mg | ORAL_TABLET | Freq: Every evening | ORAL | Status: DC
Start: 2011-06-26 — End: 2011-06-27
  Administered 2011-06-26: 40 mg via ORAL
  Filled 2011-06-26 (×2): qty 1

## 2011-06-26 MED ORDER — DIPHENHYDRAMINE HCL 25 MG PO CAPS
25.0000 mg | ORAL_CAPSULE | Freq: Every day | ORAL | Status: DC
  Administered 2011-06-26: 25 mg via ORAL
  Filled 2011-06-26 (×2): qty 1

## 2011-06-26 MED ORDER — ONDANSETRON HCL 4 MG/2ML IJ SOLN: 4.0000 mg | Freq: Four times a day (QID) | INTRAMUSCULAR | Status: DC | PRN

## 2011-06-26 MED ORDER — SODIUM CHLORIDE 0.9 % IV SOLN
INTRAVENOUS | Status: DC | PRN
  Administered 2011-06-26: 07:00:00 via INTRAVENOUS

## 2011-06-26 MED ORDER — TAMSULOSIN HCL 0.4 MG PO CAPS
0.4000 mg | ORAL_CAPSULE | Freq: Every day | ORAL | Status: DC
  Administered 2011-06-26 – 2011-06-27 (×2): 0.4 mg via ORAL
  Filled 2011-06-26 (×2): qty 1

## 2011-06-26 MED ORDER — PANTOPRAZOLE SODIUM 40 MG PO TBEC
40.0000 mg | DELAYED_RELEASE_TABLET | Freq: Every day | ORAL | Status: DC
  Administered 2011-06-27: 40 mg via ORAL
  Filled 2011-06-26: qty 1

## 2011-06-26 MED ORDER — PROPOFOL 10 MG/ML IV EMUL
INTRAVENOUS | Status: DC | PRN
  Administered 2011-06-26: 25 ug/kg/min via INTRAVENOUS

## 2011-06-26 MED ORDER — SODIUM CHLORIDE 0.9 % IV SOLN: 250.0000 mL | INTRAVENOUS | Status: DC | PRN

## 2011-06-26 MED ORDER — DIPHENHYDRAMINE-APAP (SLEEP) 25-500 MG PO TABS
1.0000 | ORAL_TABLET | Freq: Every day | ORAL | Status: DC
Start: 2011-06-26 — End: 2011-06-26

## 2011-06-26 MED ORDER — MIDAZOLAM HCL 5 MG/5ML IJ SOLN
INTRAMUSCULAR | Status: DC | PRN
  Administered 2011-06-26: 0.5 mg via INTRAVENOUS
  Administered 2011-06-26 (×2): 1 mg via INTRAVENOUS
  Administered 2011-06-26: .5 mg via INTRAVENOUS

## 2011-06-26 NOTE — Anesthesia Postprocedure Evaluation (Signed)
  Anesthesia Post-op Note  Patient: Sean [name]  Procedure(s) Performed: Procedure(s) (LRB): V-TACH ABLATION (N/A)  Patient Location: PACU and Cath Lab  Anesthesia Type: MAC  Level of Consciousness: awake  Airway and Oxygen Therapy: Patient Spontanous Breathing  Post-op Pain: mild  Post-op Assessment: Post-op Vital signs reviewed, Patient's Cardiovascular Status Stable, Respiratory Function Stable, Patent Airway, No signs of Nausea or vomiting and Pain level controlled  Post-op Vital Signs: stable  Complications: No apparent anesthesia complications

## 2011-06-26 NOTE — H&P (View-Only) (Signed)
HPI  Sean [name] is a 78 y.o. male followup for ventricular tachycardia in the setting of ischemic heart disease and a prior ICD implantation.  Some tenderness  He underwent CRT upgrade in September 2010   He is seen today because of defibrillator shock therapy. This happened a couple weeks ago. That happened again on weekends. There were associated with loss of consciousness  He has of late noted exertional chest discomfort as well as increasing shortness of breath. There has been no edema. There is an orthopnea or nocturnal dyspnea.    Because of the above he underwent catheterization that demonstrated>>> Continued patency of the LIMA to the LAD diagonal  2. Continued patency of the stent OM2 graft  3. Known occlusion of the three remaining SVG  4. Known occlusion of the RCA  5. Normal LVEDP on current regimen.  6. Mild moderate progression of the LMCA lesion----supplying a septal which itself has disease. ed no significant progression of his coronary disease.   He has lived in fear of the next episode. He has not walking. He is quite depressed. He is driving his lawnmower.    Allergies  Allergen Reactions  . Carisoprodol     REACTION: rash  . Lisinopril     REACTION: cough  . Tape     Cloth Tape tears skin  . Zolpidem Tartrate Other (See Comments)    Altered mental status  . Penicillins Rash    Review of Systems negative except from HPI and PMH  Physical Exam Well developed and well nourished in no acute distress HENT normal E scleral and icterus clear Neck Supple JVP flat; carotids brisk and full Clear to ausculation Regular rate and rhythm, no murmurs gallops or rub Soft with active bowel sounds No clubbing cyanosis and edema Alert and oriented, grossly normal motor and sensory function Skin Warm and Dry    Assessment and  Plan   Past Medical History  Diagnosis Date  . HYPERLIPIDEMIA 04/07/2008  . HYPERTENSION 04/07/2008  . CAD, ARTERY BYPASS GRAFT  05/22/2008  . CARDIOMYOPATHY, ISCHEMIC 05/22/2008  . MITRAL VALVE PROLAPSE, NON-RHEUMATIC 05/22/2008  . AV BLOCK 05/22/2008  . Chronic systolic heart failure 10/13/2008  . CAROTID ARTERY STENOSIS 11/15/2009  . GERD 04/07/2008  . RENAL INSUFFICIENCY 10/17/2008  . DEGENERATIVE JOINT DISEASE, LEFT KNEE 04/07/2008  . COLONIC POLYPS, HX OF 04/07/2008  . DIVERTICULITIS, HX OF 04/07/2008  . Atrial fibrillation 04/04/2010  . Ischemic heart disease   . Decreased left ventricular function   . Ventricular tachycardia   . CHF (congestive heart failure)     class 2 to 3    Past Surgical History  Procedure Date  . Cardiac surgery     open heart 1991, 1997, pacemaker insertion St Jude 2010 CD 3231  . Lung surgery 1987  . Hernia repair 1952    2004  . Cholecystectomy   . Appendectomy   . Thoracotomy     secondary to lung mass  . Insert / replace / remove pacemaker     St Jude unify CD 3231/2010    Current Outpatient Prescriptions  Medication Sig Dispense Refill  . aspirin 81 MG tablet Take 1 tablet (81 mg total) by mouth every other day.      . carvedilol (COREG) 12.5 MG tablet Take 12.5 mg by mouth 2 (two) times daily with a meal.      . diphenhydramine-acetaminophen (TYLENOL PM) 25-500 MG TABS Take 1 tablet by mouth at bedtime.       .   docusate sodium (COLACE) 100 MG capsule Take 100 mg by mouth daily as needed. For constipation      . fenofibrate 160 MG tablet Take 160 mg by mouth daily.      . furosemide (LASIX) 40 MG tablet Take 40-80 mg by mouth 2 (two) times daily. 80 mg every morning and 40 mg every afternoon      . isosorbide mononitrate (IMDUR) 60 MG 24 hr tablet Take 60 mg by mouth daily.      . losartan (COZAAR) 50 MG tablet Take 25 mg by mouth daily.       . lubiprostone (AMITIZA) 24 MCG capsule Take 24 mcg by mouth daily as needed. For constipation      . nitroGLYCERIN (NITROSTAT) 0.4 MG SL tablet Place 0.4 mg under the tongue every 5 (five) minutes as needed. For chest pain      .  omeprazole (PRILOSEC) 20 MG capsule TAKE 1 CAPSULE BY MOUTH TWICE DAILY  60 capsule  11  . potassium chloride (K-DUR,KLOR-CON) 10 MEQ tablet Take 1 tablet (10 mEq total) by mouth 2 (two) times daily.  60 tablet  3  . simvastatin (ZOCOR) 40 MG tablet TAKE ONE TABLET BY MOUTH EVERY NIGHT AT BEDTIME  30 tablet  11  . Tamsulosin HCl (FLOMAX) 0.4 MG CAPS Take 0.4 mg by mouth daily.       . vitamin B-12 (CYANOCOBALAMIN) 500 MCG tablet Take 500 mcg by mouth daily.        . warfarin (COUMADIN) 5 MG tablet Take 2.5-5 mg by mouth daily. 2.5 mg sun tue and thur, 5mg all other days      . DISCONTD: omeprazole (PRILOSEC) 20 MG capsule Take 20 mg by mouth 2 (two) times daily.      . DISCONTD: simvastatin (ZOCOR) 40 MG tablet Take 40 mg by mouth every evening.      . DISCONTD: triamcinolone (NASACORT AQ) 55 MCG/ACT nasal inhaler Place 2 sprays into the nose as needed.          Allergies  Allergen Reactions  . Carisoprodol     REACTION: rash  . Lisinopril     REACTION: cough  . Tape     Cloth Tape tears skin  . Zolpidem Tartrate Other (See Comments)    Altered mental status  . Penicillins Rash    Review of Systems negative except from HPI and PMH  Physical Exam BP 116/56  Pulse 58  Ht 6' 1" (1.854 m)  Wt 202 lb (91.627 kg)  BMI 26.65 kg/m2 Well developed and well nourished in no acute distress HENT normal E scleral and icterus clear Neck Supple JVP flat; carotids brisk and full Clear to ausculation Regular rate and rhythm, PMI is dyskinetic and displaced and a 3/6 murmur heard at the apex Soft with active bowel sounds No clubbing cyanosis none Edema Alert and oriented, grossly normal motor and sensory function Skin Warm and Dry   Assessment and  Plan  

## 2011-06-26 NOTE — Transfer of Care (Signed)
Immediate Anesthesia Transfer of Care Note  Patient: Sean [name]  Procedure(s) Performed: Procedure(s) (LRB): V-TACH ABLATION (N/A)  Patient Location: PACU  Anesthesia Type: MAC  Level of Consciousness: awake and alert   Airway & Oxygen Therapy: Patient Spontanous Breathing  Post-op Assessment: Report given to PACU RN and Post -op Vital signs reviewed and stable  Post vital signs: Reviewed and stable  Complications: No apparent anesthesia complications

## 2011-06-26 NOTE — Interval H&P Note (Signed)
History and Physical Interval Note:  06/26/2011 7:17 AM  Sean [name]  has presented today for surgery, with the diagnosis of vt  The various methods of treatment have been discussed with the patient and family. After consideration of risks, benefits and other options for treatment, the patient has consented to  Procedure(s) (LRB): V-TACH ABLATION (N/A) as a surgical intervention .  The patients' history has been reviewed, patient examined, no change in status, stable for surgery.  I have reviewed the patients' chart and labs.  Questions were answered to the patient's satisfaction.     Hillis Range

## 2011-06-26 NOTE — Op Note (Signed)
EPS/RFA of VT performed without immediate complication. H#[id number].

## 2011-06-26 NOTE — Anesthesia Preprocedure Evaluation (Addendum)
Anesthesia Evaluation  Patient identified by MRN, date of birth, ID band Patient awake    Reviewed: Allergy & Precautions, H&P , NPO status , Patient's Chart, lab work & pertinent test results  Airway Mallampati: I TM Distance: >3 FB Neck ROM: Full    Dental   Pulmonary sleep apnea ,  breath sounds clear to auscultation        Cardiovascular hypertension, + CAD and +CHF + dysrhythmias Ventricular Tachycardia + Valvular Problems/Murmurs MVP Rhythm:Regular Rate:Normal     Neuro/Psych    GI/Hepatic GERD-  ,  Endo/Other    Renal/GU      Musculoskeletal   Abdominal   Peds  Hematology   Anesthesia Other Findings   Reproductive/Obstetrics                          Anesthesia Physical Anesthesia Plan  ASA: III  Anesthesia Plan: MAC   Post-op Pain Management:    Induction: Intravenous  Airway Management Planned: Simple Face Mask  Additional Equipment:   Intra-op Plan:   Post-operative Plan:   Informed Consent: I have reviewed the patients History and Physical, chart, labs and discussed the procedure including the risks, benefits and alternatives for the proposed anesthesia with the patient or authorized representative who has indicated his/her understanding and acceptance.     Plan Discussed with: CRNA and Surgeon  Anesthesia Plan Comments:         Anesthesia Quick Evaluation

## 2011-06-26 NOTE — Preoperative (Signed)
Beta Blockers   Reason not to administer Beta Blockers:Not Applicable 

## 2011-06-27 DIAGNOSIS — I472 Ventricular tachycardia: Secondary | ICD-10-CM

## 2011-06-27 LAB — PROTIME-INR: INR: 2.46 — ABNORMAL HIGH (ref 0.00–1.49)

## 2011-06-27 MED ORDER — TRAZODONE 25 MG HALF TABLET: 25.0000 mg | ORAL_TABLET | Freq: Every day | ORAL | Status: DC

## 2011-06-27 MED ORDER — TRAZODONE 25 MG HALF TABLET
25.0000 mg | ORAL_TABLET | Freq: Every evening | ORAL | Status: DC | PRN
  Filled 2011-06-27: qty 1

## 2011-06-27 NOTE — Op Note (Signed)
NAMETORETTO, TINGLER NO.:  [id number]  MEDICAL RECORD NO.:  [id number]  LOCATION:  2925                         FACILITY:  [facility]  PHYSICIAN:  Doylene Canning. Ladona Ridgel, MD    DATE OF BIRTH:  24-Dec-1931  DATE OF PROCEDURE:  06/26/2011 DATE OF DISCHARGE:                              OPERATIVE REPORT   SURGEON:  Doylene Canning. Ladona Ridgel, MD  PROCEDURE PERFORMED:  Electrophysiologic study and RF catheter ablation of ventricular tachycardia utilizing 3D electroanatomic mapping and transseptal catheterization.  INTRODUCTION:  The patient is a 78 year old male with ischemic cardiomyopathy, complete heart block, status post BiV ICD implantation. He has had recurrent ventricular tachycardia with both antitachycardic pacing and ICD shocks.  The patient was offered antiarrhythmic drug therapy, but declined and wished to proceed with catheter ablation.  He is now referred for additional evaluation and treatment.  PROCEDURE:  After informed was obtained, the patient was taken to the Diagnostic EP Lab in the fasting state.  He was sedated with fentanyl and Versed, and propofol under the direction of the Anesthesia Service. A 6-French quadripolar catheter was inserted percutaneously into the right femoral vein and advanced to the right ventricle.  A 6-French quadripolar catheter was inserted percutaneously in the right femoral vein and advanced to His bundle region.  A 7-French quadripolar ablation catheter was inserted percutaneously into the right femoral artery and advanced retrograde across the aortic valve into the left ventricle.  It should be noted that there was significant tortuosity in the aorta and femoral artery making manipulation of the catheter more difficult.  A voltage map was subsequently made in the left ventricle.  This demonstrated an extensive scar in the septal region of the left ventricle extending from near the base to approximately two-thirds out towards the apex  and from the anteroseptum to the inferoseptum.  The manipulation of the catheter resulted in ventricular tachycardia. Mapping demonstrated hemodynamic instability.  The patient's blood pressure was approximately 50.  Fortunately, he could be pace terminated from his VT.  At this point because of manipulation of the catheters in the left ventricle was so difficult, transseptal catheterization was carried out and an Agilis flexible sheath was utilized.  The 7-French quadripolar ablation catheter was then maneuvered by way of the right femoral vein and crossed into the left atrium and then into the left ventricle.  A total of 12 RF energy applications were delivered with the irrigated navigation utilizing 3D electroanatomic mapping.  Prior to ablation, programmed ventricular stimulation was carried out, which demonstrated inducible ventricular tachycardia utilizing S1, S2, S3, S4 stimuli.  Again, the ventricular tachycardia that was induced was not mappable secondary to the patient's hemodynamic instability.  It should be noted that all of the VTs were left bundle morphology.  One of the VT was inferiorly directed axis, the other two were superiorly directed axis VTs.  During RF energy application, RF energy was applied to the regions where there was isthmus of alive, but not healthy tissue and the scar tissue was connected to the healthy tissue utilizing RF energy application.  Following catheter ablation, programmed ventricular stimulation could not induce any additional ventricular tachycardia.  At this point, the catheters  were removed, hemostasis was assured, and the patient was returned to his room in satisfactory condition.  COMPLICATIONS:  There were no immediate procedure complications.  RESULTS:  A.  Baseline ECG:  Baseline ECG demonstrates normal sinus rhythm with biventricular pacing. B.  Baseline intervals:  The QRS duration was 156 milliseconds.  There was underlying complete  heart block. C.  Rapid ventricular pacing:  The rapid ventricular pacing was carried out from the right ventricle demonstrating VA Wenckebach being greater than 600 milliseconds.  The ventricles dissociated from the atrium. D.  Programmed ventricular stimulation:  Programmed ventricular stimulation was carried out from the right ventricle as well as from the left ventricle at base drive cycle length of 540 milliseconds.  S1, S2; S1, S2, S3; and S1, S2, S3, S4 stimuli were delivered.  At an S1, S2, S3, S4 coupling interval of 600/280/260/360, there was inducible ventricular tachycardia.  This tachycardia was hemodynamically unstable. E.  Rapid atrial pacing:  The rapid atrial pacing was carried out from the right atrium demonstrating AV dissociation at 600 milliseconds. F.  Programmed atrial stimulation:  Programmed atrial stimulation was carried out from the right atrium at base drive cycle length of 981 milliseconds demonstrating AV Wenckebach. G.  Arrhythmias observed. 1. Ventricular tachycardia.  Initiation was spontaneous with catheter     manipulation.  Duration was sustained.  Cycle length was 330     milliseconds.  The morphology was left bundle-branch block,     inferior axis.  The duration was sustained, termination was with     antitachycardic pacing. 2. Ventricular tachycardia.  Initiation was spontaneous, duration was     sustained, the cycle length was 387 milliseconds.  Morphology was     left bundle with a left superior axis.  The tachycardia was     terminated with antitachycardic pacing. 3. Also a left bundle VT, also with a superior left axis.  All were     terminated with antitachycardic pacing.     a.     Mapping:  Mapping of the patient's tachycardia all exited      from the scar on the septal wall of the left ventricle.  These      appeared to be exiting either high in the case of the inferior      axis VT or low in the atrial septum.     b.     RF energy  application:  A total of 12 RF energy applications      were delivered to multiple sites along the left ventricular septum      creating ablation lines between good healthy survivor myocardium      and scar tissue from the base of the septum down to the      diaphragmatic wall of the septum down to the anteroseptal wall and      anterior wall.  CONCLUSION:  This study demonstrates apparent successful catheter ablation of multiple ventricular foci originating from the septum of the left ventricle.  Following catheter ablation, the patient was rendered noninducible despite an aggressive programmed stimulation protocol.     Doylene Canning. Ladona Ridgel, MD     GWT/MEDQ  D:  06/26/2011  T:  06/27/2011  Job:  [id number]  cc:   Duke Salvia, MD, [facility]

## 2011-06-27 NOTE — Discharge Summary (Signed)
ELECTROPHYSIOLOGY DISCHARGE SUMMARY    Patient ID: Sean [name],  MRN: [id number], DOB/AGE: [id number] 76 y.o.  Admit date: 06/26/2011 Discharge date: 06/27/2011  Primary Care Physician: Kristian Covey, MD, MD Primary Cardiologist: Dr. [name] Primary Electrophysiologist: Dr. Sherryl Manges   Primary Discharge Diagnosis:  1.  Ventricular tachycardia s/p RF ablation  Secondary Discharge Diagnoses:  1.  Ischemic cardiomyopathy s/p Bi-V ICD implant 2.  CAD s/p CABG 3.  Chronic systolic CHF 4.  Atrial fibrillation 5.  HTN 6.  Dyslipidemia 7.  Renal insufficiency  Procedures This Admission: 1.  EP study and RF catheter ablation of ventricular tachycardia utilizing 3D electroanatomic mapping and transseptal catheterization with conclusions as follows -  This study demonstrates apparent successful catheter ablation of multiple ventricular foci originating from the septum of the left ventricle. Following catheter ablation, the patient was rendered noninducible despite an aggressive programmed stimulation protocol (VT #1 - cycle length 331 ms, spontaneous method of induction, terminated by pacing HIS 300 ms; VT #2 - cycle length 387 ms, spontaneous method of induction, terminated spontaneously; VT #3 - 329 ms, induced by programmed stimulation 600-280-260-360, terminated by V pacing 300 ms; all VTs poorly tolerated with hemodynamic compromise  History and Hospital Course:  Please see admission H&P for full details. Briefly, Sean [name] is a 79 year old man with an ischemic cardiomyopathy s/p Bi-V ICD implant and CAD who was recently evaluated as an outpatient for ICD shocks (early April 2013). His shocks were appropriate for VT. He then underwent elective LHC on 06/10/2011 which revealed stable/unchanged CAD and medical management was recommended. As discussed with Dr. Graciela Husbands, he then elected to proceed with an EP study and RF ablation of VT which was performed yesterday by Dr. Ladona Ridgel  and Dr. Johney Frame, with results/conclusions as outlined above. There were no immediate complications and Sean [name] remains hemodynamically stable. His procedure site is intact with no evidence of bleeding or hematoma. His telemetry shows AV pacing without any arrhythmias. He was counseled regarding wound care and follow-up. He will continue all of his usual home medications without changes. He has been seen, examined and deemed stable for discharge by Dr. Hillis Range.    Discharge Vitals: Blood pressure 105/35, pulse 60, temperature 98.2 F (36.8 C), temperature source Oral, resp. rate 20, height 6\' 1"  (1.854 m), weight 196 lb (88.905 kg), SpO2 95.00%.   Labs:  [facility] 06/27/11 0450  INR 2.46*    Disposition:  The patient is being discharged in stable condition.  Follow-up: Follow-up Information    Follow up with Sherryl Manges, MD on 07/21/2011. (At 12:00 noon)    Contact information:   [address],  [address] [place] [address] [phone]   Discharge Medications:   TAKE these medications     aspirin 81 MG tablet   Take 1 tablet (81 mg total) by mouth every other day.      carvedilol 12.5 MG tablet   Commonly known as: COREG   Take 12.5 mg by mouth 2 (two) times daily with a meal.      diphenhydramine-acetaminophen 25-500 MG Tabs   Commonly known as: TYLENOL PM   Take 1 tablet by mouth at bedtime.      docusate sodium 100 MG capsule   Commonly known as: COLACE   Take 100 mg by mouth daily as needed. For constipation      fenofibrate 160 MG tablet   Take 160 mg by mouth daily.  FLOMAX 0.4 MG Caps   Generic drug: Tamsulosin HCl   Take 0.4 mg by mouth daily.      furosemide 40 MG tablet   Commonly known as: LASIX   80 mg every morning and 40 mg every afternoon      isosorbide mononitrate 60 MG 24 hr tablet   Commonly known as: IMDUR   Take 60 mg by mouth daily.      losartan 50 MG tablet   Commonly known as: COZAAR   Take 25 mg by  mouth daily.      lubiprostone 24 MCG capsule   Commonly known as: AMITIZA   Take 24 mcg by mouth daily as needed. For constipation      nitroGLYCERIN 0.4 MG SL tablet   Commonly known as: NITROSTAT   Place 0.4 mg under the tongue every 5 (five) minutes as needed. For chest pain      omeprazole 20 MG capsule   Commonly known as: PRILOSEC   Take 20 mg by mouth 2 (two) times daily.      potassium chloride SA 20 MEQ tablet   Commonly known as: K-DUR,KLOR-CON   Take 1 tablet (20 mEq total) by mouth 2 (two) times daily.      simvastatin 40 MG tablet   Commonly known as: ZOCOR   Take 40 mg by mouth every evening.      vitamin B-12 500 MCG tablet   Commonly known as: CYANOCOBALAMIN   Take 500 mcg by mouth daily.      warfarin 5 MG tablet   Commonly known as: COUMADIN   Take 2.5-5 mg by mouth daily. 2.5 mg sun tue and thur, 5mg  all other days      Duration of Discharge Encounter: Greater than 30 minutes including physician time.  Signed, Rick Duff, PA-C 06/27/2011, 8:20 AM   I have seen, examined the patient, and reviewed the above assessment and plan.   Co Sign: Hillis Range, MD 06/27/2011 8:44 AM

## 2011-06-27 NOTE — Progress Notes (Signed)
SUBJECTIVE: The patient is doing well today.  At this time, he denies chest pain, shortness of breath, or any new concerns.     Marland Kitchen acetaminophen  500 mg Oral QHS   And  . diphenhydrAMINE  25 mg Oral QHS  . carvedilol  12.5 mg Oral BID WC  . fenofibrate  160 mg Oral Daily  . furosemide  40 mg Oral Daily  . isosorbide mononitrate  60 mg Oral Daily  . lidocaine  3 mL Infiltration Once  . losartan  25 mg Oral Daily  . pantoprazole  40 mg Oral Q1200  . potassium chloride SA  20 mEq Oral BID  . simvastatin  40 mg Oral QPM  . sodium chloride  3 mL Intravenous Q12H  . Tamsulosin HCl  0.4 mg Oral Daily  . warfarin  2.5 mg Oral Custom  . warfarin  5 mg Oral Custom  . Warfarin - Physician Dosing Inpatient   Does not apply q1800  . DISCONTD: diphenhydramine-acetaminophen  1 tablet Oral QHS  . DISCONTD: traZODone  25 mg Oral QHS  . DISCONTD: warfarin  2.5-5 mg Oral Daily      . DISCONTD: sodium chloride 50 mL/hr at 06/26/11 0604    OBJECTIVE: Physical Exam: Filed Vitals:   06/27/11 0200 06/27/11 0300 06/27/11 0400 06/27/11 0802  BP: 100/33 95/61 105/35   Pulse: 60 59 60   Temp:   98 F (36.7 C) 98.2 F (36.8 C)  TempSrc:   Oral Oral  Resp: 21 25 20    Height:      Weight:      SpO2: 94% 95% 95%     Intake/Output Summary (Last 24 hours) at 06/27/11 0845 Last data filed at 06/27/11 0500  Gross per 24 hour  Intake   1000 ml  Output   3075 ml  Net  -2075 ml    Telemetry reveals sinus rhythm  GEN- The patient is well appearing, alert and oriented x 3 today.   Head- normocephalic, atraumatic Eyes-  Sclera clear, conjunctiva pink Ears- hearing intact Oropharynx- clear Neck- supple, no JVP Lymph- no cervical lymphadenopathy Lungs- Clear to ausculation bilaterally, normal work of breathing Heart- Regular rate and rhythm, no murmurs, rubs or gallops, PMI not laterally displaced GI- soft, NT, ND, + BS Extremities- no clubbing, cyanosis, or edema, no hematoma or  bruit Skin- no rash or lesion Psych- euthymic mood, full affect Neuro- strength and sensation are intact  LABS: Basic Metabolic Panel:  Basename 06/25/11 1352  NA 139  K 3.6  CL 102  CO2 27  GLUCOSE 132*  BUN 30*  CREATININE 1.9*  CALCIUM 9.4  MG --  PHOS --   Liver Function Tests: No results found for this basename: AST:2,ALT:2,ALKPHOS:2,BILITOT:2,PROT:2,ALBUMIN:2 in the last 72 hours No results found for this basename: LIPASE:2,AMYLASE:2 in the last 72 hours CBC: No results found for this basename: WBC:2,NEUTROABS:2,HGB:2,HCT:2,MCV:2,PLT:2 in the last 72 hours Cardiac Enzymes: No results found for this basename: CKTOTAL:3,CKMB:3,CKMBINDEX:3,TROPONINI:3 in the last 72 hours BNP: No components found with this basename: POCBNP:3 D-Dimer: No results found for this basename: DDIMER:2 in the last 72 hours Hemoglobin A1C: No results found for this basename: HGBA1C in the last 72 hours Fasting Lipid Panel: No results found for this basename: CHOL,HDL,LDLCALC,TRIG,CHOLHDL,LDLDIRECT in the last 72 hours Thyroid Function Tests: No results found for this basename: TSH,T4TOTAL,FREET3,T3FREE,THYROIDAB in the last 72 hours Anemia Panel: No results found for this basename: VITAMINB12,FOLATE,FERRITIN,TIBC,IRON,RETICCTPCT in the last 72 hours  RADIOLOGY: No results found.  ASSESSMENT AND PLAN:  Active Problems:  CAD, ARTERY BYPASS GRAFT  CARDIOMYOPATHY, ISCHEMIC  Chronic systolic heart failure  Ventricular tachycardia  Doing well s/p VT ablation without early recurrence or complications. DC to home on previous home medicines. Follow-up with Dr Graciela Husbands in 4 weeks No driving for 6 months from last VT episode (pt aware)   Hillis Range, MD 06/27/2011 8:45 AM

## 2011-07-01 ENCOUNTER — Ambulatory Visit: Payer: Medicare Other | Admitting: Internal Medicine

## 2011-07-01 ENCOUNTER — Ambulatory Visit (INDEPENDENT_AMBULATORY_CARE_PROVIDER_SITE_OTHER): Payer: Medicare Other

## 2011-07-01 DIAGNOSIS — Z7901 Long term (current) use of anticoagulants: Secondary | ICD-10-CM

## 2011-07-01 DIAGNOSIS — I059 Rheumatic mitral valve disease, unspecified: Secondary | ICD-10-CM

## 2011-07-01 DIAGNOSIS — I4891 Unspecified atrial fibrillation: Secondary | ICD-10-CM

## 2011-07-01 LAB — POCT INR: INR: 3.2

## 2011-07-21 ENCOUNTER — Encounter: Payer: Self-pay | Admitting: Internal Medicine

## 2011-07-21 ENCOUNTER — Ambulatory Visit (INDEPENDENT_AMBULATORY_CARE_PROVIDER_SITE_OTHER): Payer: Medicare Other

## 2011-07-21 ENCOUNTER — Ambulatory Visit (INDEPENDENT_AMBULATORY_CARE_PROVIDER_SITE_OTHER): Payer: Medicare Other | Admitting: Internal Medicine

## 2011-07-21 VITALS — BP 110/58 | HR 73 | Ht 73.0 in | Wt 199.8 lb

## 2011-07-21 DIAGNOSIS — I472 Ventricular tachycardia: Secondary | ICD-10-CM

## 2011-07-21 DIAGNOSIS — Z95810 Presence of automatic (implantable) cardiac defibrillator: Secondary | ICD-10-CM

## 2011-07-21 DIAGNOSIS — I4891 Unspecified atrial fibrillation: Secondary | ICD-10-CM

## 2011-07-21 DIAGNOSIS — Z7901 Long term (current) use of anticoagulants: Secondary | ICD-10-CM

## 2011-07-21 DIAGNOSIS — I5022 Chronic systolic (congestive) heart failure: Secondary | ICD-10-CM

## 2011-07-21 DIAGNOSIS — I059 Rheumatic mitral valve disease, unspecified: Secondary | ICD-10-CM

## 2011-07-21 LAB — ICD DEVICE OBSERVATION
AL AMPLITUDE: 3.7 mv
AL IMPEDENCE ICD: 375 Ohm
AL THRESHOLD: 0.75 V
ATRIAL PACING ICD: 30 pct
BAMS-0001: 150 {beats}/min
BAMS-0003: 70 {beats}/min
CHARGE TIME: 9.8 s
DEVICE MODEL ICD: 726856
FVT: 0
HV IMPEDENCE: 45 Ohm
LV LEAD IMPEDENCE ICD: 375 Ohm
MODE SWITCH EPISODES: 33
PACEART VT: 0
RV LEAD AMPLITUDE: 12 mv
RV LEAD IMPEDENCE ICD: 450 Ohm
RV LEAD THRESHOLD: 0.75 V
RV LEAD THRESHOLD: 2.75 V
TOT-0006: 20100927000000
TOT-0007: 1
TOT-0008: 0
TOT-0009: 0
TOT-0010: 10
TZAT-0001FASTVT: 1
TZAT-0001SLOWVT: 1
TZAT-0004FASTVT: 8
TZAT-0004SLOWVT: 8
TZAT-0012FASTVT: 200 ms
TZAT-0012SLOWVT: 200 ms
TZAT-0013FASTVT: 1
TZAT-0013SLOWVT: 3
TZAT-0018FASTVT: NEGATIVE
TZAT-0018SLOWVT: NEGATIVE
TZAT-0019FASTVT: 7.5 V
TZAT-0019SLOWVT: 7.5 V
TZAT-0020FASTVT: 1 ms
TZAT-0020SLOWVT: 1 ms
TZON-0003FASTVT: 280 ms
TZON-0003SLOWVT: 350 ms
TZON-0004FASTVT: 25
TZON-0004SLOWVT: 35
TZON-0005FASTVT: 6
TZON-0005SLOWVT: 6
TZON-0010FASTVT: 80 ms
TZON-0010SLOWVT: 80 ms
TZST-0001FASTVT: 2
TZST-0001FASTVT: 3
TZST-0001FASTVT: 4
TZST-0001FASTVT: 5
TZST-0001SLOWVT: 2
TZST-0001SLOWVT: 3
TZST-0001SLOWVT: 4
TZST-0001SLOWVT: 5
TZST-0003FASTVT: 36 J
TZST-0003FASTVT: 40 J
TZST-0003FASTVT: 40 J
TZST-0003FASTVT: 40 J
TZST-0003SLOWVT: 36 J
TZST-0003SLOWVT: 40 J
TZST-0003SLOWVT: 40 J
VENTRICULAR PACING ICD: 99 pct
VF: 0

## 2011-07-21 LAB — POCT INR: INR: 2.2

## 2011-07-21 NOTE — Progress Notes (Signed)
HPI  Sean [name] is a 78 y.o. male followup for ventricular tachycardia in the setting of ischemic heart disease and a prior ICD implantation. Some tenderness  He underwent CRT upgrade in September 2010   In the early and mid spring he developed ventricular tachycardia for which he was then ultimately submitted to catheter ablation by Dr. Ladona Ridgel. May 2013. Targeted areas were no left ventricular septum. He was rendered noninducible. He has done well without significant chest pain or shortness of breath. He actually had a get into a fight between his dog and another dog and had no problems with that.  Past Medical History  Diagnosis Date  . HYPERLIPIDEMIA 04/07/2008  . HYPERTENSION 04/07/2008  . CAD, ARTERY BYPASS GRAFT 05/22/2008  . CARDIOMYOPATHY, ISCHEMIC 05/22/2008  . MITRAL VALVE PROLAPSE, NON-RHEUMATIC 05/22/2008  . AV BLOCK 05/22/2008  . Chronic systolic heart failure 10/13/2008  . CAROTID ARTERY STENOSIS 11/15/2009  . GERD 04/07/2008  . RENAL INSUFFICIENCY 10/17/2008  . DEGENERATIVE JOINT DISEASE, LEFT KNEE 04/07/2008  . COLONIC POLYPS, HX OF 04/07/2008  . DIVERTICULITIS, HX OF 04/07/2008  . Atrial fibrillation 04/04/2010  . Ischemic heart disease   . Decreased left ventricular function   . Ventricular tachycardia   . CHF (congestive heart failure)     class 2 to 3    Past Surgical History  Procedure Date  . Cardiac surgery     open heart 1991, 1997, pacemaker insertion St Jude 2010 CD 3231  . Lung surgery 1987  . Hernia repair 1952    2004  . Cholecystectomy   . Appendectomy   . Thoracotomy     secondary to lung mass  . Insert / replace / remove pacemaker     St Jude unify CD 3231/2010    Current Outpatient Prescriptions  Medication Sig Dispense Refill  . aspirin 81 MG tablet Take 1 tablet (81 mg total) by mouth every other day.      . carvedilol (COREG) 12.5 MG tablet Take 12.5 mg by mouth 2 (two) times daily with a meal.      . diphenhydramine-acetaminophen (TYLENOL  PM) 25-500 MG TABS Take 1 tablet by mouth at bedtime.       . docusate sodium (COLACE) 100 MG capsule Take 100 mg by mouth daily as needed. For constipation      . fenofibrate 160 MG tablet Take 160 mg by mouth daily.      . furosemide (LASIX) 40 MG tablet 80 mg every morning and 40 mg every afternoon  90 tablet  6  . isosorbide mononitrate (IMDUR) 60 MG 24 hr tablet Take 60 mg by mouth daily.      Marland Kitchen losartan (COZAAR) 50 MG tablet Take 25 mg by mouth daily.       Marland Kitchen lubiprostone (AMITIZA) 24 MCG capsule Take 24 mcg by mouth daily as needed. For constipation      . nitroGLYCERIN (NITROSTAT) 0.4 MG SL tablet Place 0.4 mg under the tongue every 5 (five) minutes as needed. For chest pain      . omeprazole (PRILOSEC) 20 MG capsule Take 20 mg by mouth 2 (two) times daily.      . potassium chloride SA (KLOR-CON M20) 20 MEQ tablet Take 1 tablet (20 mEq total) by mouth 2 (two) times daily.  60 tablet  6  . simvastatin (ZOCOR) 40 MG tablet Take 40 mg by mouth every evening.      . Tamsulosin HCl (FLOMAX) 0.4 MG CAPS Take 0.4 mg  by mouth daily.       . vitamin B-12 (CYANOCOBALAMIN) 500 MCG tablet Take 500 mcg by mouth daily.        Marland Kitchen warfarin (COUMADIN) 5 MG tablet Take 2.5-5 mg by mouth daily. 2.5 mg sun tue and thur, 5mg  all other days      . DISCONTD: triamcinolone (NASACORT AQ) 55 MCG/ACT nasal inhaler Place 2 sprays into the nose as needed.          Allergies  Allergen Reactions  . Carisoprodol     REACTION: rash  . Lisinopril     REACTION: cough  . Tape     Cloth Tape tears skin  . Zolpidem Tartrate Other (See Comments)    Altered mental status  . Penicillins Rash    Review of Systems negative except from HPI and PMH  Physical Exam BP 110/58  Pulse 73  Ht 6\' 1"  (1.854 m)  Wt 199 lb 12.8 oz (90.629 kg)  BMI 26.36 kg/m2 Well developed and well nourished in no acute distress HENT normal E scleral and icterus clear Neck Supple JVP flat; carotids brisk and full Clear to  ausculation Regular rate and rhythm, no murmurs gallops or rub Soft with active bowel sounds No clubbing cyanosis no  Edema Alert and oriented, grossly normal motor and sensory function Skin Warm and Dry  Electrocardiogram demonstrates p-synchronous pacing at 75 beats per minute   Assessment and  Plan

## 2011-07-21 NOTE — Assessment & Plan Note (Signed)
No intercurrent ventricular tachycardia following catheter ablation. He is advised that he can drive 3 months following his episode of VT

## 2011-07-21 NOTE — Assessment & Plan Note (Signed)
Stable on current medications 

## 2011-07-21 NOTE — Assessment & Plan Note (Signed)
The patient's device was interrogated.  The information was reviewed. No changes were made in the programming.    

## 2011-07-21 NOTE — Assessment & Plan Note (Signed)
No intercurrent atrial fibrillation 

## 2011-07-21 NOTE — Patient Instructions (Signed)

## 2011-07-31 ENCOUNTER — Ambulatory Visit: Payer: Medicare Other | Admitting: Cardiology

## 2011-08-06 ENCOUNTER — Other Ambulatory Visit: Payer: Self-pay | Admitting: Cardiology

## 2011-08-14 ENCOUNTER — Ambulatory Visit (INDEPENDENT_AMBULATORY_CARE_PROVIDER_SITE_OTHER): Payer: Medicare Other | Admitting: Cardiology

## 2011-08-14 ENCOUNTER — Encounter: Payer: Self-pay | Admitting: Cardiology

## 2011-08-14 ENCOUNTER — Ambulatory Visit (INDEPENDENT_AMBULATORY_CARE_PROVIDER_SITE_OTHER): Payer: Medicare Other | Admitting: *Deleted

## 2011-08-14 VITALS — BP 128/60 | HR 66 | Resp 18 | Ht 73.0 in | Wt 199.0 lb

## 2011-08-14 DIAGNOSIS — I059 Rheumatic mitral valve disease, unspecified: Secondary | ICD-10-CM

## 2011-08-14 DIAGNOSIS — I4891 Unspecified atrial fibrillation: Secondary | ICD-10-CM

## 2011-08-14 DIAGNOSIS — I10 Essential (primary) hypertension: Secondary | ICD-10-CM

## 2011-08-14 DIAGNOSIS — I2589 Other forms of chronic ischemic heart disease: Secondary | ICD-10-CM

## 2011-08-14 DIAGNOSIS — R0989 Other specified symptoms and signs involving the circulatory and respiratory systems: Secondary | ICD-10-CM

## 2011-08-14 DIAGNOSIS — Z7901 Long term (current) use of anticoagulants: Secondary | ICD-10-CM

## 2011-08-14 DIAGNOSIS — E785 Hyperlipidemia, unspecified: Secondary | ICD-10-CM

## 2011-08-14 NOTE — Assessment & Plan Note (Signed)
He is doing overall well.  No shortness of breath.  Continue current meds.  He may return to work.

## 2011-08-14 NOTE — Assessment & Plan Note (Signed)
Stable at present.  Recheck llpid and liver.

## 2011-08-14 NOTE — Progress Notes (Signed)
HPI:  The patient is in for followup. Overall he is doing quite well. He would like to return to work. Is not been significantly short of breath. He has been tolerating all of his medicines well.  Current Outpatient Prescriptions  Medication Sig Dispense Refill  . aspirin 81 MG tablet Take 1 tablet (81 mg total) by mouth every other day.      . carvedilol (COREG) 12.5 MG tablet Take 12.5 mg by mouth 2 (two) times daily with a meal.      . diphenhydramine-acetaminophen (TYLENOL PM) 25-500 MG TABS Take 1 tablet by mouth at bedtime.       . docusate sodium (COLACE) 100 MG capsule Take 100 mg by mouth daily as needed. For constipation      . fenofibrate 160 MG tablet Take 160 mg by mouth daily.      . furosemide (LASIX) 40 MG tablet 80 mg every morning and 40 mg every afternoon  90 tablet  6  . isosorbide mononitrate (IMDUR) 60 MG 24 hr tablet Take 60 mg by mouth daily.      Marland Kitchen losartan (COZAAR) 50 MG tablet Take 25 mg by mouth daily.       Marland Kitchen lubiprostone (AMITIZA) 24 MCG capsule Take 24 mcg by mouth daily as needed. For constipation      . nitroGLYCERIN (NITROSTAT) 0.4 MG SL tablet Place 0.4 mg under the tongue every 5 (five) minutes as needed. For chest pain      . omeprazole (PRILOSEC) 20 MG capsule Take 20 mg by mouth 2 (two) times daily.      . potassium chloride SA (KLOR-CON M20) 20 MEQ tablet Take 1 tablet (20 mEq total) by mouth 2 (two) times daily.  60 tablet  6  . simvastatin (ZOCOR) 40 MG tablet Take 40 mg by mouth every evening.      . Tamsulosin HCl (FLOMAX) 0.4 MG CAPS Take 0.4 mg by mouth daily.       . vitamin B-12 (CYANOCOBALAMIN) 500 MCG tablet Take 500 mcg by mouth daily.        Marland Kitchen warfarin (COUMADIN) 5 MG tablet Take 2.5-5 mg by mouth daily. 2.5 mg sun tue and thur, 5mg  all other days      . warfarin (COUMADIN) 5 MG tablet TAKE 1 TABLET BY MOUTH AS DIRECTED  30 tablet  3  . DISCONTD: triamcinolone (NASACORT AQ) 55 MCG/ACT nasal inhaler Place 2 sprays into the nose as needed.           Allergies  Allergen Reactions  . Carisoprodol     REACTION: rash  . Lisinopril     REACTION: cough  . Tape     Cloth Tape tears skin  . Zolpidem Tartrate Other (See Comments)    Altered mental status  . Penicillins Rash    Past Medical History  Diagnosis Date  . HYPERLIPIDEMIA 04/07/2008  . HYPERTENSION 04/07/2008  . CAD, ARTERY BYPASS GRAFT 05/22/2008  . CARDIOMYOPATHY, ISCHEMIC 05/22/2008  . MITRAL VALVE PROLAPSE, NON-RHEUMATIC 05/22/2008  . AV BLOCK 05/22/2008  . Chronic systolic heart failure 10/13/2008  . CAROTID ARTERY STENOSIS 11/15/2009  . GERD 04/07/2008  . RENAL INSUFFICIENCY 10/17/2008  . DEGENERATIVE JOINT DISEASE, LEFT KNEE 04/07/2008  . COLONIC POLYPS, HX OF 04/07/2008  . DIVERTICULITIS, HX OF 04/07/2008  . Atrial fibrillation 04/04/2010  . Decreased left ventricular function   . Ventricular tachycardia     Status post catheter ablation may 2013  . CHF (congestive heart failure)  class 2 to 3    Past Surgical History  Procedure Date  . Cardiac surgery     open heart 1991, 1997, pacemaker insertion St Jude 2010 CD 3231  . Lung surgery 1987  . Hernia repair 1952    2004  . Cholecystectomy   . Appendectomy   . Thoracotomy     secondary to lung mass  . Insert / replace / remove pacemaker     St Jude unify CD 3231/2010    Family History  Problem Relation Age of Onset  . Heart disease Mother     CAD, deceased   . Coronary artery disease Mother   . Heart disease Sister   . Heart disease Brother     History   Social History  . Marital Status: Married    Spouse Name: N/A    Number of Children: 2  . Years of Education: N/A   Occupational History  .     Social History Main Topics  . Smoking status: Former Smoker -- 0.5 packs/day for 30 years    Types: Cigarettes    Quit date: 04/24/1972  . Smokeless tobacco: Never Used  . Alcohol Use: No  . Drug Use: No  . Sexually Active: Not on file   Other Topics Concern  . Not on file   Social History  Narrative  . No narrative on file    ROS: Please see the HPI.  All other systems reviewed and negative.  PHYSICAL EXAM:  BP 128/60  Pulse 66  Resp 18  Ht 6\' 1"  (1.854 m)  Wt 199 lb (90.266 kg)  BMI 26.25 kg/m2  General: Well developed, well nourished, in no acute distress. Head:  Normocephalic and atraumatic. Neck: no JVD Lungs: Clear to auscultation and percussion. Heart: Normal S1 and wide split.  Apical murmur of MR, unchanged from prior tracing.   Abdomen:  Normal bowel sounds; soft; non tender; no organomegaly Pulses: Pulses normal in all 4 extremities. Extremities: No clubbing or cyanosis. No edema. Neurologic: Alert and oriented x 3.  EKG:  Atrioventricular pacing.   ASSESSMENT AND PLAN:

## 2011-08-14 NOTE — Patient Instructions (Addendum)
Your physician recommends that you return for a FASTING LIPID, LIVER and BMP--nothing to eat or drink after midnight, lab opens at 8:30 (08/22/11)  Your physician recommends that you schedule a follow-up appointment in: 3 MONTHS with Dr [name]  Your physician recommends that you continue on your current medications as directed. Please refer to the Current Medication list given to you today.

## 2011-08-14 NOTE — Assessment & Plan Note (Signed)
Controlled.  

## 2011-08-14 NOTE — Assessment & Plan Note (Signed)
Murmur evident, but unchanged.

## 2011-08-14 NOTE — Assessment & Plan Note (Signed)
Recheck bmet.  

## 2011-08-22 ENCOUNTER — Other Ambulatory Visit (INDEPENDENT_AMBULATORY_CARE_PROVIDER_SITE_OTHER): Payer: Medicare Other

## 2011-08-22 DIAGNOSIS — I10 Essential (primary) hypertension: Secondary | ICD-10-CM

## 2011-08-22 DIAGNOSIS — I059 Rheumatic mitral valve disease, unspecified: Secondary | ICD-10-CM

## 2011-08-22 DIAGNOSIS — N183 Chronic kidney disease, stage 3 unspecified: Secondary | ICD-10-CM

## 2011-08-22 DIAGNOSIS — E785 Hyperlipidemia, unspecified: Secondary | ICD-10-CM

## 2011-08-22 DIAGNOSIS — I2589 Other forms of chronic ischemic heart disease: Secondary | ICD-10-CM

## 2011-08-22 LAB — BASIC METABOLIC PANEL
BUN: 19 mg/dL (ref 6–23)
Chloride: 103 mEq/L (ref 96–112)
Creatinine, Ser: 1.7 mg/dL — ABNORMAL HIGH (ref 0.4–1.5)
Glucose, Bld: 106 mg/dL — ABNORMAL HIGH (ref 70–99)
Potassium: 4.2 mEq/L (ref 3.5–5.1)

## 2011-08-22 LAB — HEPATIC FUNCTION PANEL
ALT: 14 U/L (ref 0–53)
AST: 23 U/L (ref 0–37)
Albumin: 4.1 g/dL (ref 3.5–5.2)
Alkaline Phosphatase: 27 U/L — ABNORMAL LOW (ref 39–117)
Bilirubin, Direct: 0.1 mg/dL (ref 0.0–0.3)
Total Bilirubin: 0.9 mg/dL (ref 0.3–1.2)
Total Protein: 7.2 g/dL (ref 6.0–8.3)

## 2011-08-22 LAB — LIPID PANEL
Cholesterol: 148 mg/dL (ref 0–200)
Triglycerides: 184 mg/dL — ABNORMAL HIGH (ref 0.0–149.0)

## 2011-09-10 ENCOUNTER — Other Ambulatory Visit: Payer: Self-pay | Admitting: Cardiology

## 2011-09-11 ENCOUNTER — Ambulatory Visit (INDEPENDENT_AMBULATORY_CARE_PROVIDER_SITE_OTHER): Payer: Medicare Other | Admitting: *Deleted

## 2011-09-11 DIAGNOSIS — I4891 Unspecified atrial fibrillation: Secondary | ICD-10-CM

## 2011-09-11 DIAGNOSIS — Z7901 Long term (current) use of anticoagulants: Secondary | ICD-10-CM

## 2011-09-11 DIAGNOSIS — I059 Rheumatic mitral valve disease, unspecified: Secondary | ICD-10-CM

## 2011-10-10 NOTE — Addendum Note (Signed)
Addended by: Lacie Scotts on: 10/10/2011 04:05 PM   Modules accepted: Orders

## 2011-10-23 ENCOUNTER — Ambulatory Visit (INDEPENDENT_AMBULATORY_CARE_PROVIDER_SITE_OTHER): Payer: Medicare Other

## 2011-10-23 DIAGNOSIS — I4891 Unspecified atrial fibrillation: Secondary | ICD-10-CM

## 2011-10-23 DIAGNOSIS — Z7901 Long term (current) use of anticoagulants: Secondary | ICD-10-CM

## 2011-10-23 DIAGNOSIS — I059 Rheumatic mitral valve disease, unspecified: Secondary | ICD-10-CM

## 2011-10-23 LAB — POCT INR: INR: 2.3

## 2011-10-27 ENCOUNTER — Ambulatory Visit (INDEPENDENT_AMBULATORY_CARE_PROVIDER_SITE_OTHER): Payer: Medicare Other | Admitting: *Deleted

## 2011-10-27 ENCOUNTER — Encounter: Payer: Self-pay | Admitting: Internal Medicine

## 2011-10-27 DIAGNOSIS — I4891 Unspecified atrial fibrillation: Secondary | ICD-10-CM

## 2011-10-27 DIAGNOSIS — I5022 Chronic systolic (congestive) heart failure: Secondary | ICD-10-CM

## 2011-10-27 LAB — REMOTE ICD DEVICE
AL AMPLITUDE: 3.6 mv
AL IMPEDENCE ICD: 360 Ohm
ATRIAL PACING ICD: 33 pct
BAMS-0001: 150 {beats}/min
BRDY-0002RA: 60 {beats}/min
DEVICE MODEL ICD: 726856
HV IMPEDENCE: 41 Ohm
MODE SWITCH EPISODES: 1
RV LEAD AMPLITUDE: 12 mv
TZAT-0001FASTVT: 1
TZAT-0004FASTVT: 8
TZAT-0018SLOWVT: NEGATIVE
TZAT-0019FASTVT: 7.5 V
TZAT-0019SLOWVT: 7.5 V
TZAT-0020FASTVT: 1 ms
TZAT-0020SLOWVT: 1 ms
TZON-0003SLOWVT: 350 ms
TZON-0004FASTVT: 25
TZON-0004SLOWVT: 35
TZON-0010FASTVT: 80 ms
TZON-0010SLOWVT: 80 ms
TZST-0001FASTVT: 3
TZST-0001FASTVT: 4
TZST-0001FASTVT: 5
TZST-0001SLOWVT: 2
TZST-0001SLOWVT: 4
TZST-0003FASTVT: 36 J
TZST-0003SLOWVT: 40 J
VENTRICULAR PACING ICD: 98 pct

## 2011-11-04 NOTE — Progress Notes (Signed)
Remote defib check w/icm  

## 2011-11-06 ENCOUNTER — Other Ambulatory Visit: Payer: Self-pay | Admitting: Internal Medicine

## 2011-11-18 ENCOUNTER — Ambulatory Visit (INDEPENDENT_AMBULATORY_CARE_PROVIDER_SITE_OTHER): Payer: Medicare Other

## 2011-11-18 ENCOUNTER — Encounter: Payer: Self-pay | Admitting: *Deleted

## 2011-11-18 ENCOUNTER — Ambulatory Visit (INDEPENDENT_AMBULATORY_CARE_PROVIDER_SITE_OTHER): Payer: Medicare Other | Admitting: Cardiology

## 2011-11-18 ENCOUNTER — Encounter: Payer: Self-pay | Admitting: Cardiology

## 2011-11-18 VITALS — BP 116/58 | HR 69 | Ht 73.0 in | Wt 203.0 lb

## 2011-11-18 DIAGNOSIS — I6529 Occlusion and stenosis of unspecified carotid artery: Secondary | ICD-10-CM

## 2011-11-18 DIAGNOSIS — E785 Hyperlipidemia, unspecified: Secondary | ICD-10-CM

## 2011-11-18 DIAGNOSIS — I10 Essential (primary) hypertension: Secondary | ICD-10-CM

## 2011-11-18 DIAGNOSIS — I25810 Atherosclerosis of coronary artery bypass graft(s) without angina pectoris: Secondary | ICD-10-CM

## 2011-11-18 DIAGNOSIS — Z23 Encounter for immunization: Secondary | ICD-10-CM

## 2011-11-18 DIAGNOSIS — I2589 Other forms of chronic ischemic heart disease: Secondary | ICD-10-CM

## 2011-11-18 DIAGNOSIS — I4891 Unspecified atrial fibrillation: Secondary | ICD-10-CM

## 2011-11-18 LAB — CBC WITH DIFFERENTIAL/PLATELET
Basophils Absolute: 0.1 10*3/uL (ref 0.0–0.1)
Eosinophils Relative: 4.8 % (ref 0.0–5.0)
HCT: 40.5 % (ref 39.0–52.0)
Hemoglobin: 13.1 g/dL (ref 13.0–17.0)
Lymphs Abs: 1.5 10*3/uL (ref 0.7–4.0)
Monocytes Relative: 16.5 % — ABNORMAL HIGH (ref 3.0–12.0)
Neutro Abs: 2.6 10*3/uL (ref 1.4–7.7)
RDW: 14.6 % (ref 11.5–14.6)

## 2011-11-18 LAB — BASIC METABOLIC PANEL
GFR: 37.25 mL/min — ABNORMAL LOW (ref >60.00)
Glucose, Bld: 101 mg/dL — ABNORMAL HIGH (ref 70–99)
Potassium: 3.6 mEq/L (ref 3.5–5.1)
Sodium: 137 mEq/L (ref 135–145)

## 2011-11-18 NOTE — Assessment & Plan Note (Signed)
BP is well controlled 

## 2011-11-18 NOTE — Patient Instructions (Addendum)
Your physician recommends that you have lab work today: BMP and CBC  Your physician recommends that you schedule a follow-up appointment in: 3 MONTHS with Dr [name]  Your physician recommends that you continue on your current medications as directed. Please refer to the Current Medication list given to you today.  Your physician has requested that you have a carotid duplex. This test is an ultrasound of the carotid arteries in your neck. It looks at blood flow through these arteries that supply the brain with blood. Allow one hour for this exam. There are no restrictions or special instructions.

## 2011-11-18 NOTE — Assessment & Plan Note (Signed)
Near target on simvastatin.

## 2011-11-18 NOTE — Assessment & Plan Note (Signed)
He continues to get along well. The ventricular tachycardia ablation was quite helpful. He appears to be euvolemic at the present time. He checks his weights on a regular basis. We will recheck his basic metabolic profile today as he is on a number of medicines which affect renal and K homeostasis.

## 2011-11-18 NOTE — Progress Notes (Signed)
HPI: The patient is seen in follow up.  He is doing pretty well.  He does not have a lot of stamina.  He is not particularly short of breath.  No chest pain.  VT ablation was very helpful.  Denies other symptoms.     Current Outpatient Prescriptions  Medication Sig Dispense Refill  . aspirin 81 MG tablet Take 1 tablet (81 mg total) by mouth every other day.      . carvedilol (COREG) 12.5 MG tablet TAKE 1 TABLET BY MOUTH TWICE DAILY WITH A MEAL  60 tablet  5  . diphenhydramine-acetaminophen (TYLENOL PM) 25-500 MG TABS Take 1 tablet by mouth at bedtime.       . docusate sodium (COLACE) 100 MG capsule Take 100 mg by mouth daily as needed. For constipation      . fenofibrate 160 MG tablet Take 160 mg by mouth daily.      . furosemide (LASIX) 40 MG tablet 80 mg every morning and 40 mg every afternoon  90 tablet  6  . isosorbide mononitrate (IMDUR) 60 MG 24 hr tablet TAKE 1 TABLET BY MOUTH EVERY DAY  90 tablet  3  . losartan (COZAAR) 50 MG tablet Take 25 mg by mouth daily.       Marland Kitchen lubiprostone (AMITIZA) 24 MCG capsule Take 24 mcg by mouth daily as needed. For constipation      . nitroGLYCERIN (NITROSTAT) 0.4 MG SL tablet Place 0.4 mg under the tongue every 5 (five) minutes as needed. For chest pain      . omeprazole (PRILOSEC) 20 MG capsule Take 20 mg by mouth daily.       . potassium chloride SA (KLOR-CON M20) 20 MEQ tablet Take 1 tablet (20 mEq total) by mouth 2 (two) times daily.  60 tablet  6  . simvastatin (ZOCOR) 40 MG tablet Take 40 mg by mouth every evening.      . Tamsulosin HCl (FLOMAX) 0.4 MG CAPS Take 0.4 mg by mouth daily.       . vitamin B-12 (CYANOCOBALAMIN) 500 MCG tablet Take 500 mcg by mouth daily.        Marland Kitchen warfarin (COUMADIN) 5 MG tablet TAKE 1 TABLET BY MOUTH AS DIRECTED  30 tablet  3  . DISCONTD: carvedilol (COREG) 12.5 MG tablet Take 12.5 mg by mouth 2 (two) times daily with a meal.      . DISCONTD: isosorbide mononitrate (IMDUR) 60 MG 24 hr tablet Take 60 mg by mouth daily.       Marland Kitchen DISCONTD: triamcinolone (NASACORT AQ) 55 MCG/ACT nasal inhaler Place 2 sprays into the nose as needed.        Marland Kitchen DISCONTD: warfarin (COUMADIN) 5 MG tablet Take 2.5-5 mg by mouth daily. 2.5 mg sun tue and thur, 5mg  all other days        Allergies  Allergen Reactions  . Carisoprodol     REACTION: rash  . Lisinopril     REACTION: cough  . Tape     Cloth Tape tears skin  . Zolpidem Tartrate Other (See Comments)    Altered mental status  . Penicillins Rash    Past Medical History  Diagnosis Date  . HYPERLIPIDEMIA 04/07/2008  . HYPERTENSION 04/07/2008  . CAD, ARTERY BYPASS GRAFT 05/22/2008  . CARDIOMYOPATHY, ISCHEMIC 05/22/2008  . MITRAL VALVE PROLAPSE, NON-RHEUMATIC 05/22/2008  . AV BLOCK 05/22/2008  . Chronic systolic heart failure 10/13/2008  . CAROTID ARTERY STENOSIS 11/15/2009  . GERD 04/07/2008  .  RENAL INSUFFICIENCY 10/17/2008  . DEGENERATIVE JOINT DISEASE, LEFT KNEE 04/07/2008  . COLONIC POLYPS, HX OF 04/07/2008  . DIVERTICULITIS, HX OF 04/07/2008  . Atrial fibrillation 04/04/2010  . Decreased left ventricular function   . Ventricular tachycardia     Status post catheter ablation may 2013  . CHF (congestive heart failure)     class 2 to 3    Past Surgical History  Procedure Date  . Cardiac surgery     open heart 1991, 1997, pacemaker insertion St Jude 2010 CD 3231  . Lung surgery 1987  . Hernia repair 1952    2004  . Cholecystectomy   . Appendectomy   . Thoracotomy     secondary to lung mass  . Insert / replace / remove pacemaker     St Jude unify CD 3231/2010    Family History  Problem Relation Age of Onset  . Heart disease Mother     CAD, deceased   . Coronary artery disease Mother   . Heart disease Sister   . Heart disease Brother     History   Social History  . Marital Status: Married    Spouse Name: N/A    Number of Children: 2  . Years of Education: N/A   Occupational History  .     Social History Main Topics  . Smoking status: Former Smoker --  0.5 packs/day for 30 years    Types: Cigarettes    Quit date: 04/24/1972  . Smokeless tobacco: Never Used  . Alcohol Use: No  . Drug Use: No  . Sexually Active: Not on file   Other Topics Concern  . Not on file   Social History Narrative  . No narrative on file    ROS: Please see the HPI.  All other systems reviewed and negative.  PHYSICAL EXAM:  BP 116/58  Pulse 69  Ht 6\' 1"  (1.854 m)  Wt 203 lb (92.08 kg)  BMI 26.78 kg/m2  General: Well developed, well nourished, in no acute distress. Head:  Normocephalic and atraumatic. Neck: no JVD Lungs: Clear to auscultation and percussion. Heart: Normal S1 and S2.  LSE holosystolic murmur radiating to axilla  2/6 Abdomen:  Normal bowel sounds; soft; non tender; no organomegaly Pulses: Pulses normal in all 4 extremities. Extremities: No clubbing or cyanosis. No edema. Neurologic: Alert and oriented x 3.  EKG:  Atrial tracking with ventricular tracking  ASSESSMENT AND PLAN:

## 2011-11-26 ENCOUNTER — Other Ambulatory Visit: Payer: Self-pay | Admitting: Cardiology

## 2011-12-05 ENCOUNTER — Encounter (INDEPENDENT_AMBULATORY_CARE_PROVIDER_SITE_OTHER): Payer: Medicare Other

## 2011-12-05 ENCOUNTER — Ambulatory Visit (INDEPENDENT_AMBULATORY_CARE_PROVIDER_SITE_OTHER): Payer: Medicare Other | Admitting: *Deleted

## 2011-12-05 DIAGNOSIS — I059 Rheumatic mitral valve disease, unspecified: Secondary | ICD-10-CM

## 2011-12-05 DIAGNOSIS — I4891 Unspecified atrial fibrillation: Secondary | ICD-10-CM

## 2011-12-05 DIAGNOSIS — I6529 Occlusion and stenosis of unspecified carotid artery: Secondary | ICD-10-CM

## 2011-12-05 DIAGNOSIS — Z7901 Long term (current) use of anticoagulants: Secondary | ICD-10-CM

## 2011-12-23 ENCOUNTER — Telehealth: Payer: Self-pay | Admitting: Cardiology

## 2011-12-23 MED ORDER — NITROGLYCERIN 0.4 MG SL SUBL: 0.4000 mg | SUBLINGUAL_TABLET | SUBLINGUAL | Status: DC | PRN

## 2011-12-23 NOTE — Telephone Encounter (Signed)
I spoke with the pt's wife and made her aware of the pt's carotid results. She said the pt also needs a refill on NTG.  Rx sent to pharmacy.

## 2011-12-23 NOTE — Telephone Encounter (Signed)
New problem:    Returning call from yesterday.

## 2011-12-25 ENCOUNTER — Ambulatory Visit (INDEPENDENT_AMBULATORY_CARE_PROVIDER_SITE_OTHER): Payer: Medicare Other | Admitting: Family Medicine

## 2011-12-25 ENCOUNTER — Encounter: Payer: Self-pay | Admitting: Family Medicine

## 2011-12-25 VITALS — BP 120/70 | HR 72 | Temp 97.5°F | Resp 12 | Ht 71.0 in | Wt 201.0 lb

## 2011-12-25 DIAGNOSIS — E785 Hyperlipidemia, unspecified: Secondary | ICD-10-CM

## 2011-12-25 DIAGNOSIS — I10 Essential (primary) hypertension: Secondary | ICD-10-CM

## 2011-12-25 DIAGNOSIS — I5022 Chronic systolic (congestive) heart failure: Secondary | ICD-10-CM

## 2011-12-25 DIAGNOSIS — Z0000 Encounter for general adult medical examination without abnormal findings: Secondary | ICD-10-CM

## 2011-12-25 DIAGNOSIS — L989 Disorder of the skin and subcutaneous tissue, unspecified: Secondary | ICD-10-CM

## 2011-12-25 DIAGNOSIS — I25810 Atherosclerosis of coronary artery bypass graft(s) without angina pectoris: Secondary | ICD-10-CM

## 2011-12-25 DIAGNOSIS — N259 Disorder resulting from impaired renal tubular function, unspecified: Secondary | ICD-10-CM

## 2011-12-25 NOTE — Progress Notes (Signed)
Subjective:    Patient ID: Sean [name], male    DOB: 1931-02-25, 78 y.o.   MRN: [id number]  HPI  Patient here for Medicare wellness exam and medical followup. He has multiple chronic problems including history of CAD with previous bypass surgery, peripheral vascular disease, systolic heart failure, hypertension, atrial fibrillation, chronic kidney disease, chronic Coumadin therapy, GERD, obstructive sleep apnea, osteoarthritis, and some chronic constipation. Medications reviewed.  History of ventricular tachycardia with previous catheter ablation. He's not any recent dyspnea or chest pains except for some mild dyspnea with walking. This is unchanged from his baseline. He tries to walk about 1 mile per day for exercise. Immunizations up to date. Recent colonoscopy last year. He has history of BPH symptomatically stable on Flomax. Coumadin followed in the Coumadin clinic. No recent bleeding complications. Regular eye exams per optometry. No recent falls. No mood disorder.  He complains of some problems with erectile dysfunction for several years. He knows he cannot take medications like Viagra because of his nitroglycerin therapy. He is not interested in pursuing other urologic interventions at this time  Patient has scabbed scaly area posterior to left ear. He does frequently scratch at this area. No history of skin cancer. Never biopsied or treated.  Past Medical History  Diagnosis Date  . HYPERLIPIDEMIA 04/07/2008  . HYPERTENSION 04/07/2008  . CAD, ARTERY BYPASS GRAFT 05/22/2008  . CARDIOMYOPATHY, ISCHEMIC 05/22/2008  . MITRAL VALVE PROLAPSE, NON-RHEUMATIC 05/22/2008  . AV BLOCK 05/22/2008  . Chronic systolic heart failure 10/13/2008  . CAROTID ARTERY STENOSIS 11/15/2009  . GERD 04/07/2008  . RENAL INSUFFICIENCY 10/17/2008  . DEGENERATIVE JOINT DISEASE, LEFT KNEE 04/07/2008  . COLONIC POLYPS, HX OF 04/07/2008  . DIVERTICULITIS, HX OF 04/07/2008  . Atrial fibrillation 04/04/2010  . Decreased left  ventricular function   . Ventricular tachycardia     Status post catheter ablation may 2013  . CHF (congestive heart failure)     class 2 to 3   Past Surgical History  Procedure Date  . Cardiac surgery     open heart 1991, 1997, pacemaker insertion St Jude 2010 CD 3231  . Lung surgery 1987  . Hernia repair 1952    2004  . Cholecystectomy   . Appendectomy   . Thoracotomy     secondary to lung mass  . Insert / replace / remove pacemaker     St Jude unify CD 3231/2010    reports that he quit smoking about 39 years ago. His smoking use included Cigarettes. He has a 15 pack-year smoking history. He has never used smokeless tobacco. He reports that he does not drink alcohol or use illicit drugs. family history includes Coronary artery disease in his mother and Heart disease in his brother, mother, and sister. Allergies  Allergen Reactions  . Carisoprodol     REACTION: rash  . Lisinopril     REACTION: cough  . Tape     Cloth Tape tears skin  . Zolpidem Tartrate Other (See Comments)    Altered mental status  . Penicillins Rash   1.  Risk factors based on Past Medical , Social, and Family history reviewed and as indicated above 2.  Limitations in physical activities no recent falls. Low risk for fall 3.  Depression/mood no depression 4.  Hearing mild subjective impairment left ear. Not limiting in any way 5.  ADLs independent in all 6.  Cognitive function (orientation to time and place, language, writing, speech,memory) no memory impairment. Judgment intact 7.  Home Safety no issues 8.  Height, weight, and visual acuity. Height and weight stable. Gets regular eye exams. Daily weights 9.  Counseling discussed exercise. 10. Recommendation of preventive services. Patient questions regarding PSA screening. We've not recommended further screening because of his age and comorbidities and he is in agreement. Immunizations up-to-date 11. Labs based on risk factors recent labs per  cardiology stable, so no lab work obtained today 12. Care Plan as above.    Review of Systems  Constitutional: Negative for fever, activity change, appetite change and unexpected weight change.  HENT: Negative for ear pain, congestion and trouble swallowing.   Eyes: Negative for pain and visual disturbance.  Respiratory: Negative for cough, shortness of breath and wheezing.   Cardiovascular: Negative for chest pain and palpitations.  Gastrointestinal: Positive for constipation (chronic and unchanged). Negative for nausea, vomiting, abdominal pain, diarrhea, blood in stool, abdominal distention and rectal pain.  Genitourinary: Negative for dysuria, hematuria and testicular pain.  Musculoskeletal: Negative for joint swelling.  Skin: Negative for rash.  Neurological: Negative for dizziness, syncope and headaches.  Hematological: Negative for adenopathy.  Psychiatric/Behavioral: Negative for confusion and dysphoric mood.       Objective:   Physical Exam  Constitutional: He is oriented to person, place, and time. He appears well-developed and well-nourished. No distress.  HENT:  Head: Normocephalic and atraumatic.  Right Ear: External ear normal.  Left Ear: External ear normal.  Mouth/Throat: Oropharynx is clear and moist.  Eyes: Conjunctivae normal and EOM are normal. Pupils are equal, round, and reactive to light.  Neck: Normal range of motion. Neck supple. No thyromegaly present.  Cardiovascular: Normal rate, regular rhythm and normal heart sounds.   No murmur heard. Pulmonary/Chest: No respiratory distress. He has no wheezes. He has no rales.  Abdominal: Soft. Bowel sounds are normal. He exhibits no distension and no mass. There is no tenderness. There is no rebound and no guarding.  Musculoskeletal: He exhibits no edema.  Lymphadenopathy:    He has no cervical adenopathy.  Neurological: He is alert and oriented to person, place, and time. He displays normal reflexes. No cranial  nerve deficit.  Skin: No rash noted.       Posterior to left ear he has nonspecific area approximately 5 mm of thickened scale is slightly scabbed center. No nodular base. No pigment change  Psychiatric: He has a normal mood and affect.          Assessment & Plan:  #1 health maintenance. Immunizations up-to-date. Discussed PSA screening and we've recommended against with his age and other comorbidities  #2 skin lesion posterior to left ear. Question nonspecific irritation versus actinic keratosis versus early squamous cell. Discussed options including referral to dermatology for biopsy versus liquid nitrogen and close followup in 3 weeks and he prefers the latter. Treated with liquid nitrogen. Reassess 3 weeks time and derm refer at that time if not resolving #3 hypertension adequately controlled  #4 history of ventricular tachycardia with previous ablation with no recurrent symptoms recently

## 2012-01-15 ENCOUNTER — Ambulatory Visit (INDEPENDENT_AMBULATORY_CARE_PROVIDER_SITE_OTHER): Payer: Medicare Other | Admitting: Family Medicine

## 2012-01-15 ENCOUNTER — Encounter: Payer: Self-pay | Admitting: Family Medicine

## 2012-01-15 VITALS — BP 110/50 | Temp 97.5°F | Wt 204.0 lb

## 2012-01-15 DIAGNOSIS — L989 Disorder of the skin and subcutaneous tissue, unspecified: Secondary | ICD-10-CM

## 2012-01-15 NOTE — Progress Notes (Signed)
  Subjective:    Patient ID: Sean [name], male    DOB: 1931/07/19, 78 y.o.   MRN: [id number]  HPI  Here for recheck lesion behind left ear. Treat with liquid nitrogen. He thinks is somewhat smoother. Nonpainful. No drainage. No history of skin cancer.   Review of Systems  Constitutional: Negative for appetite change and unexpected weight change.  Cardiovascular: Negative for chest pain.  Hematological: Negative for adenopathy.       Objective:   Physical Exam  Constitutional: He appears well-developed and well-nourished.  Cardiovascular: Normal rate and regular rhythm.   Pulmonary/Chest: Effort normal and breath sounds normal. No respiratory distress. He has no wheezes. He has no rales.  Skin:       Posterior to left ear. Small scabbed area approximately 5 mm diameter. Appears much smoother the last visit. No drainage          Assessment & Plan:  Skin lesion posterior left ear. Uncertain behavior. Improved following liquid nitrogen. Observe for one more month. If not resolving in that time referral skin surgery center for biopsy and further evaluation

## 2012-01-15 NOTE — Patient Instructions (Addendum)
Touch base in one month if left ear lesion not continuing to heal.

## 2012-01-16 ENCOUNTER — Ambulatory Visit (INDEPENDENT_AMBULATORY_CARE_PROVIDER_SITE_OTHER): Payer: Medicare Other | Admitting: *Deleted

## 2012-01-16 DIAGNOSIS — I059 Rheumatic mitral valve disease, unspecified: Secondary | ICD-10-CM

## 2012-01-16 DIAGNOSIS — I4891 Unspecified atrial fibrillation: Secondary | ICD-10-CM

## 2012-01-16 DIAGNOSIS — Z7901 Long term (current) use of anticoagulants: Secondary | ICD-10-CM

## 2012-01-19 ENCOUNTER — Other Ambulatory Visit: Payer: Self-pay | Admitting: *Deleted

## 2012-01-19 MED ORDER — POTASSIUM CHLORIDE CRYS ER 20 MEQ PO TBCR: 20.0000 meq | EXTENDED_RELEASE_TABLET | Freq: Two times a day (BID) | ORAL | Status: DC

## 2012-02-02 ENCOUNTER — Ambulatory Visit (INDEPENDENT_AMBULATORY_CARE_PROVIDER_SITE_OTHER): Payer: Medicare Other | Admitting: *Deleted

## 2012-02-02 DIAGNOSIS — Z95810 Presence of automatic (implantable) cardiac defibrillator: Secondary | ICD-10-CM

## 2012-02-02 DIAGNOSIS — I472 Ventricular tachycardia, unspecified: Secondary | ICD-10-CM

## 2012-02-02 DIAGNOSIS — I5022 Chronic systolic (congestive) heart failure: Secondary | ICD-10-CM

## 2012-02-04 LAB — REMOTE ICD DEVICE
AL AMPLITUDE: 3.3 mv
AL THRESHOLD: 0.875 V
ATRIAL PACING ICD: 37 pct
BAMS-0001: 150 {beats}/min
DEVICE MODEL ICD: 726856
RV LEAD AMPLITUDE: 12 mv
TZAT-0001SLOWVT: 1
TZAT-0004FASTVT: 8
TZAT-0013FASTVT: 1
TZAT-0013SLOWVT: 3
TZAT-0018FASTVT: NEGATIVE
TZAT-0020SLOWVT: 1 ms
TZON-0003FASTVT: 280 ms
TZON-0005SLOWVT: 6
TZON-0010SLOWVT: 80 ms
TZST-0001FASTVT: 3
TZST-0001FASTVT: 5
TZST-0001SLOWVT: 2
TZST-0001SLOWVT: 4
TZST-0001SLOWVT: 5
TZST-0003FASTVT: 40 J
TZST-0003FASTVT: 40 J
TZST-0003SLOWVT: 36 J
TZST-0003SLOWVT: 40 J

## 2012-02-24 ENCOUNTER — Encounter: Payer: Self-pay | Admitting: Cardiology

## 2012-02-24 ENCOUNTER — Ambulatory Visit (INDEPENDENT_AMBULATORY_CARE_PROVIDER_SITE_OTHER): Payer: Medicare Other | Admitting: Cardiology

## 2012-02-24 ENCOUNTER — Ambulatory Visit (INDEPENDENT_AMBULATORY_CARE_PROVIDER_SITE_OTHER): Payer: Medicare Other

## 2012-02-24 VITALS — BP 93/49 | HR 63 | Ht 73.0 in | Wt 199.0 lb

## 2012-02-24 DIAGNOSIS — I4891 Unspecified atrial fibrillation: Secondary | ICD-10-CM

## 2012-02-24 DIAGNOSIS — Z7901 Long term (current) use of anticoagulants: Secondary | ICD-10-CM

## 2012-02-24 DIAGNOSIS — I10 Essential (primary) hypertension: Secondary | ICD-10-CM

## 2012-02-24 DIAGNOSIS — I2589 Other forms of chronic ischemic heart disease: Secondary | ICD-10-CM

## 2012-02-24 DIAGNOSIS — I6529 Occlusion and stenosis of unspecified carotid artery: Secondary | ICD-10-CM

## 2012-02-24 DIAGNOSIS — I059 Rheumatic mitral valve disease, unspecified: Secondary | ICD-10-CM

## 2012-02-24 DIAGNOSIS — I5022 Chronic systolic (congestive) heart failure: Secondary | ICD-10-CM

## 2012-02-24 DIAGNOSIS — I25810 Atherosclerosis of coronary artery bypass graft(s) without angina pectoris: Secondary | ICD-10-CM

## 2012-02-24 LAB — CBC WITH DIFFERENTIAL/PLATELET
Basophils Absolute: 0 10*3/uL (ref 0.0–0.1)
Basophils Relative: 0.7 % (ref 0.0–3.0)
Eosinophils Absolute: 0.3 10*3/uL (ref 0.0–0.7)
HCT: 40.8 % (ref 39.0–52.0)
Hemoglobin: 13.8 g/dL (ref 13.0–17.0)
Lymphocytes Relative: 26.8 % (ref 12.0–46.0)
Lymphs Abs: 1.6 10*3/uL (ref 0.7–4.0)
MCHC: 33.8 g/dL (ref 30.0–36.0)
Neutro Abs: 3.3 10*3/uL (ref 1.4–7.7)
RBC: 4.7 Mil/uL (ref 4.22–5.81)
RDW: 14.3 % (ref 11.5–14.6)

## 2012-02-24 LAB — BASIC METABOLIC PANEL
BUN: 27 mg/dL — ABNORMAL HIGH (ref 6–23)
CO2: 29 mEq/L (ref 19–32)
Calcium: 9.7 mg/dL (ref 8.4–10.5)
Chloride: 101 mEq/L (ref 96–112)
Creatinine, Ser: 2 mg/dL — ABNORMAL HIGH (ref 0.4–1.5)

## 2012-02-24 NOTE — Assessment & Plan Note (Signed)
Hemodynamically stable 

## 2012-02-24 NOTE — Assessment & Plan Note (Signed)
Recheck BUN and Cr

## 2012-02-24 NOTE — Progress Notes (Signed)
HPI:  The patient returns in a followup visit. He actually is doing quite well. He denies chest pain. He's not have a lot of stamina. We discussed his carotid findings, and I recommended that he have a followup in May for his carotid. He has no cerebral symptoms.  Current Outpatient Prescriptions  Medication Sig Dispense Refill  . aspirin 81 MG tablet Take 1 tablet (81 mg total) by mouth every other day.      . carvedilol (COREG) 12.5 MG tablet TAKE 1 TABLET BY MOUTH TWICE DAILY WITH A MEAL  60 tablet  5  . diphenhydramine-acetaminophen (TYLENOL PM) 25-500 MG TABS Take 1 tablet by mouth at bedtime.       . docusate sodium (COLACE) 100 MG capsule Take 100 mg by mouth daily as needed. For constipation      . fenofibrate 160 MG tablet Take 160 mg by mouth daily.      . furosemide (LASIX) 40 MG tablet 80 mg every morning and 40 mg every afternoon  90 tablet  6  . isosorbide mononitrate (IMDUR) 60 MG 24 hr tablet TAKE 1 TABLET BY MOUTH EVERY DAY  90 tablet  3  . losartan (COZAAR) 50 MG tablet Take 25 mg by mouth daily.       Marland Kitchen lubiprostone (AMITIZA) 24 MCG capsule Take 24 mcg by mouth daily as needed. For constipation      . nitroGLYCERIN (NITROSTAT) 0.4 MG SL tablet Place 1 tablet (0.4 mg total) under the tongue every 5 (five) minutes as needed. For chest pain  25 tablet  2  . omeprazole (PRILOSEC) 20 MG capsule Take 20 mg by mouth daily.       . potassium chloride SA (KLOR-CON M20) 20 MEQ tablet Take 1 tablet (20 mEq total) by mouth 2 (two) times daily.  60 tablet  10  . simvastatin (ZOCOR) 40 MG tablet Take 40 mg by mouth every evening.      . Tamsulosin HCl (FLOMAX) 0.4 MG CAPS Take 0.4 mg by mouth daily.       . vitamin B-12 (CYANOCOBALAMIN) 500 MCG tablet Take 500 mcg by mouth daily.        Marland Kitchen warfarin (COUMADIN) 5 MG tablet TAKE 1 TABLET BY MOUTH AS DIRECTED  30 tablet  3  . [DISCONTINUED] triamcinolone (NASACORT AQ) 55 MCG/ACT nasal inhaler Place 2 sprays into the nose as needed.           Allergies  Allergen Reactions  . Carisoprodol     REACTION: rash  . Lisinopril     REACTION: cough  . Tape     Cloth Tape tears skin  . Zolpidem Tartrate Other (See Comments)    Altered mental status  . Penicillins Rash    Past Medical History  Diagnosis Date  . HYPERLIPIDEMIA 04/07/2008  . HYPERTENSION 04/07/2008  . CAD, ARTERY BYPASS GRAFT 05/22/2008  . CARDIOMYOPATHY, ISCHEMIC 05/22/2008  . MITRAL VALVE PROLAPSE, NON-RHEUMATIC 05/22/2008  . AV BLOCK 05/22/2008  . Chronic systolic heart failure 10/13/2008  . CAROTID ARTERY STENOSIS 11/15/2009  . GERD 04/07/2008  . RENAL INSUFFICIENCY 10/17/2008  . DEGENERATIVE JOINT DISEASE, LEFT KNEE 04/07/2008  . COLONIC POLYPS, HX OF 04/07/2008  . DIVERTICULITIS, HX OF 04/07/2008  . Atrial fibrillation 04/04/2010  . Decreased left ventricular function   . Ventricular tachycardia     Status post catheter ablation may 2013  . CHF (congestive heart failure)     class 2 to 3    Past Surgical  History  Procedure Date  . Cardiac surgery     open heart 1991, 1997, pacemaker insertion St Jude 2010 CD 3231  . Lung surgery 1987  . Hernia repair 1952    2004  . Cholecystectomy   . Appendectomy   . Thoracotomy     secondary to lung mass  . Insert / replace / remove pacemaker     St Jude unify CD 3231/2010    Family History  Problem Relation Age of Onset  . Heart disease Mother     CAD, deceased   . Coronary artery disease Mother   . Heart disease Sister   . Heart disease Brother     History   Social History  . Marital Status: Married    Spouse Name: N/A    Number of Children: 2  . Years of Education: N/A   Occupational History  .     Social History Main Topics  . Smoking status: Former Smoker -- 0.5 packs/day for 30 years    Types: Cigarettes    Quit date: 04/24/1972  . Smokeless tobacco: Never Used  . Alcohol Use: No  . Drug Use: No  . Sexually Active: Not on file   Other Topics Concern  . Not on file   Social History  Narrative  . No narrative on file    ROS: Please see the HPI.  All other systems reviewed and negative.  PHYSICAL EXAM:  BP 93/49  Pulse 63  Ht 6\' 1"  (1.854 m)  Wt 199 lb (90.266 kg)  BMI 26.25 kg/m2  PF 98 L/min  General: Well developed, well nourished, in no acute distress. Head:  Normocephalic and atraumatic. Neck: no JVD Lungs: Clear to auscultation and percussion. Heart: Normal S1 and S2.  LLSE and apical MR of MR and TR.   Abdomen:  Normal bowel sounds; soft; non tender; no organomegaly Pulses: Pulses normal in all 4 extremities. Extremities: No clubbing or cyanosis. No edema. Neurologic: Alert and oriented x 3.  EKG:  Atrial tracking and ventricular pacing.    ASSESSMENT AND PLAN:

## 2012-02-24 NOTE — Patient Instructions (Addendum)
LABS TODAY:  CBC and BMET.  Your physician recommends that you schedule a follow-up appointment at the end of March 2014 with Dr. [name].

## 2012-02-24 NOTE — Assessment & Plan Note (Signed)
This will need to repeated in May.

## 2012-02-24 NOTE — Assessment & Plan Note (Signed)
Patient is on medical therapy. His blood pressures soft, so he is on maximum drug dosing for his particular condition. We are watching his renal function carefully. We plan to have him return to see me in followup approximately 2 months. We will check BUN and creatinine

## 2012-02-26 ENCOUNTER — Telehealth: Payer: Self-pay | Admitting: *Deleted

## 2012-02-26 DIAGNOSIS — I25810 Atherosclerosis of coronary artery bypass graft(s) without angina pectoris: Secondary | ICD-10-CM

## 2012-02-26 NOTE — Telephone Encounter (Signed)
Message copied by Barrie Folk on Thu Feb 26, 2012 12:51 PM ------      Message from: [name] D      Created: Wed Feb 25, 2012  7:14 PM       Creatinine is slightly higher.  Ask patient to make sure he remains well hydrated.  Have him return for repeat BMET in one to two weeks because of mildly elevated creatinine.  TS

## 2012-02-26 NOTE — Telephone Encounter (Signed)
Pt aware of results and will return for BMP on 03/11/12 Mylo Red RN

## 2012-03-11 ENCOUNTER — Other Ambulatory Visit (INDEPENDENT_AMBULATORY_CARE_PROVIDER_SITE_OTHER): Payer: Medicare Other

## 2012-03-11 DIAGNOSIS — I25810 Atherosclerosis of coronary artery bypass graft(s) without angina pectoris: Secondary | ICD-10-CM

## 2012-03-11 LAB — BASIC METABOLIC PANEL
BUN: 22 mg/dL (ref 6–23)
Calcium: 9.5 mg/dL (ref 8.4–10.5)
GFR: 40.71 mL/min — ABNORMAL LOW (ref >60.00)
Glucose, Bld: 98 mg/dL (ref 70–99)
Potassium: 4 mEq/L (ref 3.5–5.1)
Sodium: 140 mEq/L (ref 135–145)

## 2012-03-23 ENCOUNTER — Telehealth: Payer: Self-pay | Admitting: Cardiology

## 2012-03-23 NOTE — Telephone Encounter (Signed)
New problem    Has some questions regarding a bill. Wants to speak with Leotis Shames

## 2012-03-23 NOTE — Telephone Encounter (Signed)
No answer at home number.

## 2012-03-24 NOTE — Telephone Encounter (Signed)
I spoke with Lawanna Kobus and she will contact Sean [name] in our billing department to review this pt's account.

## 2012-03-24 NOTE — Telephone Encounter (Signed)
I spoke with the pt and he has questions about his bills from [facility].  The pt said he has been sent statements with duplicate charges from our office (Dr [name] and Dr Ladona Ridgel).  The pt said his hospital procedures have not been covered by these providers and he would like to know why this has occurred.  I will have someone in our billing department contact this pt to see if they can answer his questions.

## 2012-03-30 ENCOUNTER — Other Ambulatory Visit: Payer: Self-pay | Admitting: Family Medicine

## 2012-04-08 ENCOUNTER — Ambulatory Visit (INDEPENDENT_AMBULATORY_CARE_PROVIDER_SITE_OTHER): Payer: Medicare Other

## 2012-04-08 DIAGNOSIS — Z7901 Long term (current) use of anticoagulants: Secondary | ICD-10-CM

## 2012-04-08 DIAGNOSIS — I4891 Unspecified atrial fibrillation: Secondary | ICD-10-CM

## 2012-04-08 DIAGNOSIS — I059 Rheumatic mitral valve disease, unspecified: Secondary | ICD-10-CM

## 2012-04-08 LAB — POCT INR: INR: 2.4

## 2012-04-15 ENCOUNTER — Other Ambulatory Visit: Payer: Self-pay | Admitting: Family Medicine

## 2012-04-30 ENCOUNTER — Ambulatory Visit: Payer: Medicare Other | Admitting: Cardiology

## 2012-05-03 ENCOUNTER — Encounter: Payer: Self-pay | Admitting: Internal Medicine

## 2012-05-03 ENCOUNTER — Ambulatory Visit (INDEPENDENT_AMBULATORY_CARE_PROVIDER_SITE_OTHER): Payer: Medicare Other | Admitting: *Deleted

## 2012-05-03 ENCOUNTER — Other Ambulatory Visit: Payer: Self-pay

## 2012-05-03 DIAGNOSIS — I2589 Other forms of chronic ischemic heart disease: Secondary | ICD-10-CM

## 2012-05-03 DIAGNOSIS — I5022 Chronic systolic (congestive) heart failure: Secondary | ICD-10-CM

## 2012-05-03 DIAGNOSIS — Z95810 Presence of automatic (implantable) cardiac defibrillator: Secondary | ICD-10-CM

## 2012-05-04 ENCOUNTER — Ambulatory Visit (INDEPENDENT_AMBULATORY_CARE_PROVIDER_SITE_OTHER): Payer: Medicare Other | Admitting: Cardiology

## 2012-05-04 ENCOUNTER — Encounter: Payer: Self-pay | Admitting: Cardiology

## 2012-05-04 VITALS — BP 110/60 | HR 65 | Ht 73.0 in | Wt 198.0 lb

## 2012-05-04 DIAGNOSIS — I472 Ventricular tachycardia, unspecified: Secondary | ICD-10-CM

## 2012-05-04 DIAGNOSIS — I059 Rheumatic mitral valve disease, unspecified: Secondary | ICD-10-CM

## 2012-05-04 DIAGNOSIS — I2589 Other forms of chronic ischemic heart disease: Secondary | ICD-10-CM

## 2012-05-04 DIAGNOSIS — I4891 Unspecified atrial fibrillation: Secondary | ICD-10-CM

## 2012-05-04 DIAGNOSIS — I25810 Atherosclerosis of coronary artery bypass graft(s) without angina pectoris: Secondary | ICD-10-CM

## 2012-05-04 DIAGNOSIS — I5022 Chronic systolic (congestive) heart failure: Secondary | ICD-10-CM

## 2012-05-04 DIAGNOSIS — N183 Chronic kidney disease, stage 3 unspecified: Secondary | ICD-10-CM

## 2012-05-04 DIAGNOSIS — I340 Nonrheumatic mitral (valve) insufficiency: Secondary | ICD-10-CM

## 2012-05-04 DIAGNOSIS — I6529 Occlusion and stenosis of unspecified carotid artery: Secondary | ICD-10-CM

## 2012-05-04 LAB — REMOTE ICD DEVICE
AL THRESHOLD: 0.875 V
ATRIAL PACING ICD: 38 pct
BRDY-0004RA: 120 {beats}/min
DEVICE MODEL ICD: 726856
HV IMPEDENCE: 41 Ohm
LV LEAD IMPEDENCE ICD: 340 Ohm
TZAT-0001FASTVT: 1
TZAT-0001SLOWVT: 1
TZAT-0012SLOWVT: 200 ms
TZAT-0013FASTVT: 1
TZAT-0013SLOWVT: 3
TZAT-0019SLOWVT: 7.5 V
TZAT-0020FASTVT: 1 ms
TZAT-0020SLOWVT: 1 ms
TZON-0004SLOWVT: 35
TZON-0005SLOWVT: 6
TZON-0010FASTVT: 80 ms
TZST-0001FASTVT: 2
TZST-0001FASTVT: 5
TZST-0001SLOWVT: 2
TZST-0001SLOWVT: 4
TZST-0003FASTVT: 36 J
TZST-0003FASTVT: 40 J
TZST-0003SLOWVT: 36 J
TZST-0003SLOWVT: 40 J
VENTRICULAR PACING ICD: 99 pct

## 2012-05-04 LAB — BASIC METABOLIC PANEL
BUN: 27 mg/dL — ABNORMAL HIGH (ref 6–23)
Calcium: 9.6 mg/dL (ref 8.4–10.5)
Creatinine, Ser: 2 mg/dL — ABNORMAL HIGH (ref 0.4–1.5)
GFR: 35.03 mL/min — ABNORMAL LOW (ref >60.00)

## 2012-05-04 NOTE — Progress Notes (Signed)
/   HPI:  This nice patient is in today for followup. From a cardiac standpoint he is about the same. He has fairly limited exercise tolerance, but there's been no change in his overall functional status over the past several months.  Current Outpatient Prescriptions  Medication Sig Dispense Refill  . aspirin 81 MG tablet Take 1 tablet (81 mg total) by mouth every other day.      . carvedilol (COREG) 12.5 MG tablet TAKE 1 TABLET BY MOUTH TWICE DAILY WITH A MEAL  60 tablet  5  . diphenhydramine-acetaminophen (TYLENOL PM) 25-500 MG TABS Take 1 tablet by mouth at bedtime.       . docusate sodium (COLACE) 100 MG capsule Take 100 mg by mouth daily as needed. For constipation      . fenofibrate 160 MG tablet Take 160 mg by mouth daily.      . furosemide (LASIX) 40 MG tablet 80 mg every morning and 40 mg every afternoon  90 tablet  6  . isosorbide mononitrate (IMDUR) 60 MG 24 hr tablet TAKE 1 TABLET BY MOUTH EVERY DAY  90 tablet  3  . losartan (COZAAR) 50 MG tablet Take 25 mg by mouth daily.       Marland Kitchen lubiprostone (AMITIZA) 24 MCG capsule Take 24 mcg by mouth daily as needed. For constipation      . nitroGLYCERIN (NITROSTAT) 0.4 MG SL tablet Place 1 tablet (0.4 mg total) under the tongue every 5 (five) minutes as needed. For chest pain  25 tablet  2  . omeprazole (PRILOSEC) 20 MG capsule Take 20 mg by mouth daily.       . potassium chloride SA (KLOR-CON M20) 20 MEQ tablet Take 1 tablet (20 mEq total) by mouth 2 (two) times daily.  60 tablet  10  . simvastatin (ZOCOR) 40 MG tablet Take 40 mg by mouth every evening.      . Tamsulosin HCl (FLOMAX) 0.4 MG CAPS Take 0.4 mg by mouth daily.       . vitamin B-12 (CYANOCOBALAMIN) 500 MCG tablet Take 500 mcg by mouth daily.        Marland Kitchen warfarin (COUMADIN) 5 MG tablet TAKE 1 TABLET BY MOUTH AS DIRECTED  30 tablet  3  . [DISCONTINUED] triamcinolone (NASACORT AQ) 55 MCG/ACT nasal inhaler Place 2 sprays into the nose as needed.         No current  facility-administered medications for this visit.    Allergies  Allergen Reactions  . Carisoprodol     REACTION: rash  . Lisinopril     REACTION: cough  . Tape     Cloth Tape tears skin  . Zolpidem Tartrate Other (See Comments)    Altered mental status  . Penicillins Rash    Past Medical History  Diagnosis Date  . HYPERLIPIDEMIA 04/07/2008  . HYPERTENSION 04/07/2008  . CAD, ARTERY BYPASS GRAFT 05/22/2008  . CARDIOMYOPATHY, ISCHEMIC 05/22/2008  . MITRAL VALVE PROLAPSE, NON-RHEUMATIC 05/22/2008  . AV BLOCK 05/22/2008  . Chronic systolic heart failure 10/13/2008  . CAROTID ARTERY STENOSIS 11/15/2009  . GERD 04/07/2008  . RENAL INSUFFICIENCY 10/17/2008  . DEGENERATIVE JOINT DISEASE, LEFT KNEE 04/07/2008  . COLONIC POLYPS, HX OF 04/07/2008  . DIVERTICULITIS, HX OF 04/07/2008  . Atrial fibrillation 04/04/2010  . Decreased left ventricular function   . Ventricular tachycardia     Status post catheter ablation may 2013  . CHF (congestive heart failure)     class 2 to 3  Past Surgical History  Procedure Laterality Date  . Cardiac surgery      open heart 1991, 1997, pacemaker insertion St Jude 2010 CD 3231  . Lung surgery  1987  . Hernia repair  1952    2004  . Cholecystectomy    . Appendectomy    . Thoracotomy      secondary to lung mass  . Insert / replace / remove pacemaker      St Jude unify CD 3231/2010    Family History  Problem Relation Age of Onset  . Heart disease Mother     CAD, deceased   . Coronary artery disease Mother   . Heart disease Sister   . Heart disease Brother     History   Social History  . Marital Status: Married    Spouse Name: N/A    Number of Children: 2  . Years of Education: N/A   Occupational History  .     Social History Main Topics  . Smoking status: Former Smoker -- 0.50 packs/day for 30 years    Types: Cigarettes    Quit date: 04/24/1972  . Smokeless tobacco: Never Used  . Alcohol Use: No  . Drug Use: No  . Sexually Active: Not on  file   Other Topics Concern  . Not on file   Social History Narrative  . No narrative on file    ROS: Please see the HPI.  All other systems reviewed and negative.  PHYSICAL EXAM:  BP 110/60  Pulse 65  Ht 6\' 1"  (1.854 m)  Wt 198 lb (89.812 kg)  BMI 26.13 kg/m2  SpO2 94%  General: Well developed, well nourished, in no acute distress. Head:  Normocephalic and atraumatic. Neck: no JVD.  Bilateral carotid bruits Lungs: Clear to auscultation and percussion. Heart: Normal S1 and S2.  Prominent apical murmur, holosystolic.  Lift in left parasternal area.   Abdomen:  Normal bowel sounds; soft; non tender; no organomegaly Pulses: Pulses normal in all 4 extremities. Extremities: No clubbing or cyanosis. No edema. Neurologic: Alert and oriented x 3.  EKG:  Atrial tracking and ventricular pacing.  BIV paced.    ASSESSMENT AND PLAN:  1.  Ischemic CM w/MR  -continue meds. 2.  CKD  Stage 2 -- continue the same.

## 2012-05-04 NOTE — Patient Instructions (Signed)
Your physician recommends that you have lab work today: [facility]  Your physician recommends that you schedule a follow-up appointment in: 2 MONTHS with Dr Shirlee Latch (previous pt of Dr [name])  Your physician recommends that you continue on your current medications as directed. Please refer to the Current Medication list given to you today.

## 2012-05-09 DIAGNOSIS — I340 Nonrheumatic mitral (valve) insufficiency: Secondary | ICD-10-CM | POA: Insufficient documentation

## 2012-05-09 NOTE — Assessment & Plan Note (Signed)
See above

## 2012-05-09 NOTE — Assessment & Plan Note (Signed)
Chronic warfarin treatment.

## 2012-05-09 NOTE — Assessment & Plan Note (Signed)
Continue to monitor function

## 2012-05-09 NOTE — Assessment & Plan Note (Signed)
See report. Should have a repeat carotid late spring with 60-79% RICA.

## 2012-05-09 NOTE — Assessment & Plan Note (Signed)
Followed by EP ---antitach pacing.

## 2012-05-09 NOTE — Assessment & Plan Note (Addendum)
Seems to be maintaining at the present time.  He will need regular follow up.  Arranging fu with Dr. Shirlee Latch.  Has borderline renal function so Cr has to be watched.  We have not added an aldosterone anatagonist.

## 2012-05-09 NOTE — Assessment & Plan Note (Signed)
No obvious structural etiology  ? PMD  Has moderate to severe MR. Would be high risk surgical candidate.

## 2012-05-21 ENCOUNTER — Ambulatory Visit (INDEPENDENT_AMBULATORY_CARE_PROVIDER_SITE_OTHER): Payer: Medicare Other | Admitting: *Deleted

## 2012-05-21 DIAGNOSIS — I059 Rheumatic mitral valve disease, unspecified: Secondary | ICD-10-CM

## 2012-05-21 DIAGNOSIS — I4891 Unspecified atrial fibrillation: Secondary | ICD-10-CM

## 2012-05-21 DIAGNOSIS — Z7901 Long term (current) use of anticoagulants: Secondary | ICD-10-CM

## 2012-05-24 ENCOUNTER — Other Ambulatory Visit: Payer: Self-pay | Admitting: *Deleted

## 2012-05-24 MED ORDER — CARVEDILOL 12.5 MG PO TABS: 12.5000 mg | ORAL_TABLET | Freq: Two times a day (BID) | ORAL | Status: DC

## 2012-05-27 ENCOUNTER — Other Ambulatory Visit: Payer: Self-pay

## 2012-05-27 DIAGNOSIS — I5022 Chronic systolic (congestive) heart failure: Secondary | ICD-10-CM

## 2012-06-03 ENCOUNTER — Encounter: Payer: Self-pay | Admitting: *Deleted

## 2012-06-03 ENCOUNTER — Encounter (INDEPENDENT_AMBULATORY_CARE_PROVIDER_SITE_OTHER): Payer: Medicare Other

## 2012-06-03 DIAGNOSIS — I5022 Chronic systolic (congestive) heart failure: Secondary | ICD-10-CM

## 2012-06-03 DIAGNOSIS — I701 Atherosclerosis of renal artery: Secondary | ICD-10-CM

## 2012-06-08 ENCOUNTER — Other Ambulatory Visit: Payer: Self-pay | Admitting: *Deleted

## 2012-06-08 MED ORDER — WARFARIN SODIUM 5 MG PO TABS: ORAL_TABLET | ORAL | Status: DC

## 2012-06-23 ENCOUNTER — Other Ambulatory Visit: Payer: Self-pay | Admitting: *Deleted

## 2012-06-23 MED ORDER — FUROSEMIDE 40 MG PO TABS: ORAL_TABLET | ORAL | Status: DC

## 2012-06-24 ENCOUNTER — Encounter (INDEPENDENT_AMBULATORY_CARE_PROVIDER_SITE_OTHER): Payer: Medicare Other

## 2012-06-24 ENCOUNTER — Encounter: Payer: Self-pay | Admitting: Cardiology

## 2012-06-24 DIAGNOSIS — I6529 Occlusion and stenosis of unspecified carotid artery: Secondary | ICD-10-CM

## 2012-06-24 NOTE — Telephone Encounter (Signed)
This encounter was created in error - please disregard.

## 2012-06-24 NOTE — Telephone Encounter (Signed)
Sean [name], wife, wanted to know the name of the renal specialist that Dr.Stuckey wanted [name] to see.

## 2012-06-24 NOTE — Telephone Encounter (Signed)
Left message on machine for pt to contact the office.   

## 2012-07-02 ENCOUNTER — Ambulatory Visit (INDEPENDENT_AMBULATORY_CARE_PROVIDER_SITE_OTHER): Payer: Medicare Other

## 2012-07-02 DIAGNOSIS — I4891 Unspecified atrial fibrillation: Secondary | ICD-10-CM

## 2012-07-02 DIAGNOSIS — Z7901 Long term (current) use of anticoagulants: Secondary | ICD-10-CM

## 2012-07-02 DIAGNOSIS — I059 Rheumatic mitral valve disease, unspecified: Secondary | ICD-10-CM

## 2012-07-08 ENCOUNTER — Ambulatory Visit (INDEPENDENT_AMBULATORY_CARE_PROVIDER_SITE_OTHER): Payer: Medicare Other | Admitting: Cardiology

## 2012-07-08 ENCOUNTER — Encounter: Payer: Self-pay | Admitting: Cardiology

## 2012-07-08 VITALS — BP 126/52 | HR 64 | Ht 73.0 in | Wt 201.0 lb

## 2012-07-08 DIAGNOSIS — N259 Disorder resulting from impaired renal tubular function, unspecified: Secondary | ICD-10-CM

## 2012-07-08 DIAGNOSIS — E785 Hyperlipidemia, unspecified: Secondary | ICD-10-CM

## 2012-07-08 DIAGNOSIS — I25810 Atherosclerosis of coronary artery bypass graft(s) without angina pectoris: Secondary | ICD-10-CM

## 2012-07-08 DIAGNOSIS — I5022 Chronic systolic (congestive) heart failure: Secondary | ICD-10-CM

## 2012-07-08 DIAGNOSIS — I059 Rheumatic mitral valve disease, unspecified: Secondary | ICD-10-CM

## 2012-07-08 DIAGNOSIS — I10 Essential (primary) hypertension: Secondary | ICD-10-CM

## 2012-07-08 DIAGNOSIS — I4891 Unspecified atrial fibrillation: Secondary | ICD-10-CM

## 2012-07-08 DIAGNOSIS — I340 Nonrheumatic mitral (valve) insufficiency: Secondary | ICD-10-CM

## 2012-07-08 LAB — LIPID PANEL
Cholesterol: 144 mg/dL (ref 0–200)
Total CHOL/HDL Ratio: 5
VLDL: 41.8 mg/dL — ABNORMAL HIGH (ref 0.0–40.0)

## 2012-07-08 LAB — LDL CHOLESTEROL, DIRECT: Direct LDL: 82.7 mg/dL

## 2012-07-08 MED ORDER — CARVEDILOL 12.5 MG PO TABS: ORAL_TABLET | ORAL | Status: DC

## 2012-07-08 NOTE — Patient Instructions (Addendum)
Increase coreg (carvedilol) to 18.75mg  two times a day. This will be 1 and 1/2 12.5mg  tablets two times a day.  Your physician recommends that you have a  lipid profile today.   Your physician recommends that you schedule a follow-up appointment in: 3 months with Dr Shirlee Latch.

## 2012-07-08 NOTE — Progress Notes (Addendum)
Patient ID: Sean [name], male   DOB: 12/02/1931, 79 y.o.   MRN: [id number] PCP: Dr. Caryl Never  79 yo with history of CAD s/p CABG, ischemic cardiomyopathy, CKD, and paroxysmal atrial fibrillation presents for cardiology followup.  He was seen by Dr. [name] in the past and is seen by me for the first time today.  I have reviewed all his old records.  Since last appointment, he has been stable.  He tires easily but this has been the case for years.  She is short of breath after walking 125 feet up hill to his mailbox.  He is short of breath walking up a flight of steps.  He does walk 1 mile/day with his wife but stops after about 1/2 mile to rest.  No tachypalpitations.  He has chronic stable angina with substernal mild chest pain that often occurs during his daily walk.  He will rest and it will resolve.  Sometimes he will take a NTG with relief.  He has had this pattern stably for a number of years.  Weight is up 2 lbs compared to last appointment.   ECG: NSR, BiV paced  Labs (7/13): LDL 77, HDL 34 Labs (4/14): K 4.3, creatinine 2.0  PMH; 1. History of DVT 2. HTN: ACEI cough. 3. Hyperlipidemia 4. CAD s/p CABG in 1991 with redo in 1997.  Chronic stable angina.  5. Ischemic cardiomyopathy: Echo in 4/13 with dilated LV, EF 30%, mildly dilated RV with mildly decreased RV function, moderate MR, mild AI.  - St Jude ICD with CRT upgrade in 9/10.  6. Carotid stenosis: 5/14 carotid dopplers with 40-59% bilateral ICA stenosis and occluded right vertebral.   7. GERD 8. CKD 9. OA 10. Paroxysmal atrial fibrillation 11. Mitral regurgitation: Moderate on last echo. 12. VT: s/p ablation in 5/13.  13. H/o diverticulitis 14. H/o cholecystectomy 15. H/o appendectomy 16. Renal artery stenosis: Renal artery dopplers (5/14) with 60-99% proximal right renal artery stenosis as well as severe celiac artery stenosis.   SH: Married, lives in [place], 2 kids, quit smoking in 1974.   FH: Mother, sister,  and brother with CAD.  ROS: All systems reviewed and negative except as per HPI.   Current Outpatient Prescriptions  Medication Sig Dispense Refill  . aspirin 81 MG tablet Take 1 tablet (81 mg total) by mouth every other day.      . diphenhydramine-acetaminophen (TYLENOL PM) 25-500 MG TABS Take 1 tablet by mouth at bedtime.       . docusate sodium (COLACE) 100 MG capsule Take 100 mg by mouth daily as needed. For constipation      . fenofibrate 160 MG tablet Take 160 mg by mouth daily.      . furosemide (LASIX) 40 MG tablet 80 mg every morning and 40 mg every afternoon  90 tablet  6  . isosorbide mononitrate (IMDUR) 60 MG 24 hr tablet TAKE 1 TABLET BY MOUTH EVERY DAY  90 tablet  3  . losartan (COZAAR) 50 MG tablet Take 25 mg by mouth daily.       . nitroGLYCERIN (NITROSTAT) 0.4 MG SL tablet Place 1 tablet (0.4 mg total) under the tongue every 5 (five) minutes as needed. For chest pain  25 tablet  2  . omeprazole (PRILOSEC) 20 MG capsule Take 20 mg by mouth daily.       . potassium chloride SA (KLOR-CON M20) 20 MEQ tablet Take 1 tablet (20 mEq total) by mouth 2 (two) times daily.  60  tablet  10  . simvastatin (ZOCOR) 40 MG tablet Take 40 mg by mouth every evening.      . Tamsulosin HCl (FLOMAX) 0.4 MG CAPS Take 0.4 mg by mouth daily.       . vitamin B-12 (CYANOCOBALAMIN) 500 MCG tablet Take 500 mcg by mouth daily.        Marland Kitchen warfarin (COUMADIN) 5 MG tablet Take as directed by coumadin clinic  30 tablet  3  . carvedilol (COREG) 12.5 MG tablet 1 and 1/2 tablets (total 18.75mg  ) two times a day  270 tablet  3  . [DISCONTINUED] triamcinolone (NASACORT AQ) 55 MCG/ACT nasal inhaler Place 2 sprays into the nose as needed.         No current facility-administered medications for this visit.    BP 126/52  Pulse 64  Ht 6\' 1"  (1.854 m)  Wt 201 lb (91.173 kg)  BMI 26.52 kg/m2  SpO2 99% General: NAD Neck: No JVD, no thyromegaly or thyroid nodule.  Lungs: Clear to auscultation bilaterally with normal  respiratory effort. CV: Nondisplaced PMI.  Heart regular S1/S2, no S3/S4, 2/6 HSM apex.  No peripheral edema.  No carotid bruit.  Normal pedal pulses.  Abdomen: Soft, nontender, no hepatosplenomegaly, no distention.   Neurologic: Alert and oriented x 3.  Psych: Normal affect. Extremities: No clubbing or cyanosis.   Assessment/Plan:  1. CAD: s/p CABG.  Patient has chronic stable angina.  No change to pattern for a number of years.  He does take a nitroglycerin tab during many of his mile walks with his wife. No chest pain at rest.  - Increase Coreg to 18.75 mg bid.  This should help with angina control.  - Continue ASA 81, statin, Imdur 60, ARB.   2. Chronic systolic CHF: EF 21% on last echo.  He does not appear volume overloaded on exam.  Continue current Lasix dosing (80 qam, 40 qpm).  He had BMET recently at Dr. Scherrie Gerlach office.  I will ask for a copy.  Continue losartan at low dose and increase Coreg to 18.75 mg bid.  3. Hyperlipidemia: Check lipids.   4. Mitral regurgitation: Moderate on last echo.  5. Carotid stenosis: Repeat carotid dopplers in 5/15.   6. Renal artery stenosis: Unilateral renal artery stenosis.  BP is controlled.  Dr. Rosalyn Charters plan had been to forward study to Dr. Caryn Section for evaluation.  I am not sure there is a lot of utility to opening this artery in the absence of uncontrolled HTN or bilateral stenosis.   7. Celiac artery: severe celiac stenosis on abdominal dopplers.  He does not describe abdominal angina. Continue to follow.  8. Paroxysmal atrial fibrillation: Patient is on warfarin.  He is in NSR today.  9. CKD: Last creatinine in 4/14 was 2.0.   Marca Ancona 07/08/2012

## 2012-07-13 ENCOUNTER — Encounter: Payer: Self-pay | Admitting: Internal Medicine

## 2012-07-13 ENCOUNTER — Ambulatory Visit (INDEPENDENT_AMBULATORY_CARE_PROVIDER_SITE_OTHER): Payer: Medicare Other | Admitting: Internal Medicine

## 2012-07-13 ENCOUNTER — Telehealth: Payer: Self-pay | Admitting: Cardiology

## 2012-07-13 VITALS — BP 113/54 | HR 59 | Ht 73.0 in | Wt 199.6 lb

## 2012-07-13 DIAGNOSIS — I2589 Other forms of chronic ischemic heart disease: Secondary | ICD-10-CM

## 2012-07-13 DIAGNOSIS — Z95810 Presence of automatic (implantable) cardiac defibrillator: Secondary | ICD-10-CM

## 2012-07-13 DIAGNOSIS — I472 Ventricular tachycardia: Secondary | ICD-10-CM

## 2012-07-13 DIAGNOSIS — I5022 Chronic systolic (congestive) heart failure: Secondary | ICD-10-CM

## 2012-07-13 DIAGNOSIS — I4891 Unspecified atrial fibrillation: Secondary | ICD-10-CM

## 2012-07-13 LAB — ICD DEVICE OBSERVATION
AL THRESHOLD: 0.75 V
BAMS-0003: 70 {beats}/min
DEVICE MODEL ICD: 726856
HV IMPEDENCE: 44 Ohm
PACEART VT: 0
RV LEAD IMPEDENCE ICD: 475 Ohm
TOT-0006: 20100927000000
TOT-0007: 1
TOT-0009: 0
TOT-0010: 13
TZAT-0001SLOWVT: 1
TZAT-0004SLOWVT: 8
TZAT-0012FASTVT: 200 ms
TZAT-0012SLOWVT: 200 ms
TZAT-0013FASTVT: 1
TZAT-0019FASTVT: 7.5 V
TZAT-0019SLOWVT: 7.5 V
TZAT-0020FASTVT: 1 ms
TZON-0003SLOWVT: 350 ms
TZON-0004FASTVT: 25
TZON-0004SLOWVT: 35
TZON-0005FASTVT: 6
TZST-0001FASTVT: 4
TZST-0001SLOWVT: 3
TZST-0001SLOWVT: 5
TZST-0003FASTVT: 40 J
TZST-0003SLOWVT: 36 J
VF: 0

## 2012-07-13 NOTE — Patient Instructions (Signed)

## 2012-07-13 NOTE — Telephone Encounter (Signed)
Spoke with pt about recent lab

## 2012-07-13 NOTE — Assessment & Plan Note (Signed)
Euvolemic. He may benefit from further up titration of his carvedilol in either hydralazine to his imdur or consideration of Aldactone

## 2012-07-13 NOTE — Assessment & Plan Note (Signed)
No intercurrent Atrial fibrillation or flutter  Would consider the discontinuation of his aspirin as he is on Coumadin and further consider the alternative use of NOACs. We'll discuss this with Dr. Gust Rung

## 2012-07-13 NOTE — Assessment & Plan Note (Signed)
The patient's device was interrogated.  The information was reviewed. No changes were made in the programming.    

## 2012-07-13 NOTE — Assessment & Plan Note (Signed)
No intercurrent Ventricular tachycardia  

## 2012-07-13 NOTE — Telephone Encounter (Signed)
New problem    Patient calling stating Dr. Shirlee Latch nurse called him today . Returning call

## 2012-07-13 NOTE — Progress Notes (Signed)
Patient Care Team: Kristian Covey, MD as PCP - General   HPI  Sean [name] is a 79 y.o. male  followup for ventricular tachycardia in the setting of ischemic heart disease with prior CABG and a prior ICD implantation.   He underwent CRT upgrade in September 2010  He  developed ventricular tachycardia for which he was then ultimately submitted to catheter ablation by Dr. Ladona Ridgel. May 2013. Targeted areas were   left ventricular septum. He was rendered noninducible.   He is doing somewhat better from when he saw Dr. DM last week which was up titration of his carvedilol.  No edema or chest pain    Past Medical History  Diagnosis Date  . HYPERLIPIDEMIA 04/07/2008  . HYPERTENSION 04/07/2008  . CAD, ARTERY BYPASS GRAFT 05/22/2008  . CARDIOMYOPATHY, ISCHEMIC 05/22/2008  . MITRAL VALVE PROLAPSE, NON-RHEUMATIC 05/22/2008  . AV BLOCK 05/22/2008  . Chronic systolic heart failure 10/13/2008  . CAROTID ARTERY STENOSIS 11/15/2009  . GERD 04/07/2008  . RENAL INSUFFICIENCY 10/17/2008  . DEGENERATIVE JOINT DISEASE, LEFT KNEE 04/07/2008  . COLONIC POLYPS, HX OF 04/07/2008  . DIVERTICULITIS, HX OF 04/07/2008  . Atrial fibrillation 04/04/2010  . Decreased left ventricular function   . Ventricular tachycardia     Status post catheter ablation may 2013  . CHF (congestive heart failure)     class 2 to 3    Past Surgical History  Procedure Laterality Date  . Cardiac surgery      open heart 1991, 1997, pacemaker insertion St Jude 2010 CD 3231  . Lung surgery  1987  . Hernia repair  1952    2004  . Cholecystectomy    . Appendectomy    . Thoracotomy      secondary to lung mass  . Insert / replace / remove pacemaker      St Jude unify CD 3231/2010    Current Outpatient Prescriptions  Medication Sig Dispense Refill  . aspirin 81 MG tablet Take 1 tablet (81 mg total) by mouth every other day.      . carvedilol (COREG) 12.5 MG tablet 1 and 1/2 tablets (total 18.75mg  ) two times a day  270 tablet  3  .  diphenhydramine-acetaminophen (TYLENOL PM) 25-500 MG TABS Take 1 tablet by mouth at bedtime.       . docusate sodium (COLACE) 100 MG capsule Take 100 mg by mouth daily as needed. For constipation      . fenofibrate 160 MG tablet Take 160 mg by mouth daily.      . furosemide (LASIX) 40 MG tablet 80 mg every morning and 40 mg every afternoon  90 tablet  6  . isosorbide mononitrate (IMDUR) 60 MG 24 hr tablet TAKE 1 TABLET BY MOUTH EVERY DAY  90 tablet  3  . losartan (COZAAR) 50 MG tablet Take 25 mg by mouth daily.       . nitroGLYCERIN (NITROSTAT) 0.4 MG SL tablet Place 1 tablet (0.4 mg total) under the tongue every 5 (five) minutes as needed. For chest pain  25 tablet  2  . omeprazole (PRILOSEC) 20 MG capsule Take 20 mg by mouth daily.       . potassium chloride SA (KLOR-CON M20) 20 MEQ tablet Take 1 tablet (20 mEq total) by mouth 2 (two) times daily.  60 tablet  10  . simvastatin (ZOCOR) 40 MG tablet Take 40 mg by mouth every evening.      . Tamsulosin HCl (FLOMAX) 0.4 MG CAPS  Take 0.4 mg by mouth daily.       . vitamin B-12 (CYANOCOBALAMIN) 500 MCG tablet Take 500 mcg by mouth daily.        Marland Kitchen warfarin (COUMADIN) 5 MG tablet Take as directed by coumadin clinic  30 tablet  3  . [DISCONTINUED] triamcinolone (NASACORT AQ) 55 MCG/ACT nasal inhaler Place 2 sprays into the nose as needed.         No current facility-administered medications for this visit.    Allergies  Allergen Reactions  . Carisoprodol     REACTION: rash  . Lisinopril     REACTION: cough  . Tape     Cloth Tape tears skin  . Zolpidem Tartrate Other (See Comments)    Altered mental status  . Penicillins Rash    Review of Systems negative except from HPI and PMH  Physical Exam BP 113/54  Pulse 59  Ht 6\' 1"  (1.854 m)  Wt 199 lb 9.6 oz (90.538 kg)  BMI 26.34 kg/m2 Well developed and well nourished in no acute distress HENT normal E scleral and icterus clear Neck Supple JVP flat; carotids brisk and full Clear to  ausculation  Regular rate and rhythm, 2/6  Murmur RUSB  Soft   No clubbing cyanosis none Edema Alert and oriented, grossly normal motor and sensory function Skin Warm and Dry  ECG demonstrates P. Synchronous pacing with a negative QRS in lead V1  Assessment and  Plan

## 2012-08-13 ENCOUNTER — Ambulatory Visit (INDEPENDENT_AMBULATORY_CARE_PROVIDER_SITE_OTHER): Payer: Medicare Other | Admitting: *Deleted

## 2012-08-13 DIAGNOSIS — Z7901 Long term (current) use of anticoagulants: Secondary | ICD-10-CM

## 2012-08-13 DIAGNOSIS — I4891 Unspecified atrial fibrillation: Secondary | ICD-10-CM

## 2012-08-13 DIAGNOSIS — I059 Rheumatic mitral valve disease, unspecified: Secondary | ICD-10-CM

## 2012-08-24 ENCOUNTER — Other Ambulatory Visit: Payer: Self-pay | Admitting: Cardiology

## 2012-08-31 ENCOUNTER — Other Ambulatory Visit: Payer: Self-pay | Admitting: Cardiovascular Disease

## 2012-09-17 ENCOUNTER — Encounter: Payer: Self-pay | Admitting: Internal Medicine

## 2012-09-24 ENCOUNTER — Ambulatory Visit (INDEPENDENT_AMBULATORY_CARE_PROVIDER_SITE_OTHER): Payer: Medicare Other | Admitting: *Deleted

## 2012-09-24 DIAGNOSIS — I059 Rheumatic mitral valve disease, unspecified: Secondary | ICD-10-CM

## 2012-09-24 DIAGNOSIS — I4891 Unspecified atrial fibrillation: Secondary | ICD-10-CM

## 2012-09-24 DIAGNOSIS — Z7901 Long term (current) use of anticoagulants: Secondary | ICD-10-CM

## 2012-09-25 ENCOUNTER — Other Ambulatory Visit: Payer: Self-pay | Admitting: Family Medicine

## 2012-10-15 ENCOUNTER — Encounter: Payer: Self-pay | Admitting: Cardiology

## 2012-10-15 ENCOUNTER — Ambulatory Visit (INDEPENDENT_AMBULATORY_CARE_PROVIDER_SITE_OTHER): Payer: Medicare Other | Admitting: Cardiology

## 2012-10-15 VITALS — BP 124/58 | HR 74 | Ht 73.0 in | Wt 200.0 lb

## 2012-10-15 DIAGNOSIS — I6529 Occlusion and stenosis of unspecified carotid artery: Secondary | ICD-10-CM

## 2012-10-15 DIAGNOSIS — I10 Essential (primary) hypertension: Secondary | ICD-10-CM

## 2012-10-15 DIAGNOSIS — I5022 Chronic systolic (congestive) heart failure: Secondary | ICD-10-CM

## 2012-10-15 DIAGNOSIS — I472 Ventricular tachycardia: Secondary | ICD-10-CM

## 2012-10-15 DIAGNOSIS — I25810 Atherosclerosis of coronary artery bypass graft(s) without angina pectoris: Secondary | ICD-10-CM

## 2012-10-15 DIAGNOSIS — I4891 Unspecified atrial fibrillation: Secondary | ICD-10-CM

## 2012-10-15 DIAGNOSIS — E785 Hyperlipidemia, unspecified: Secondary | ICD-10-CM

## 2012-10-15 DIAGNOSIS — R0602 Shortness of breath: Secondary | ICD-10-CM

## 2012-10-15 MED ORDER — LOSARTAN POTASSIUM 50 MG PO TABS: 50.0000 mg | ORAL_TABLET | Freq: Every day | ORAL | Status: DC

## 2012-10-15 NOTE — Patient Instructions (Addendum)
Increase losartan to 50mg  daily   Your physician recommends that you return for lab work in: about 10 days--BMEt/BNP.  Your physician recommends that you schedule a follow-up appointment in: 3 months with Dr Shirlee Latch.

## 2012-10-15 NOTE — Progress Notes (Signed)
Patient ID: Sean [name], male   DOB: 1931/02/26, 79 y.o.   MRN: [id number] PCP: Dr. Caryl Never  79 yo with history of CAD s/p CABG, ischemic cardiomyopathy, CKD, and paroxysmal atrial fibrillation presents for cardiology followup.  Since last appointment, he has been stable.  He tires easily but this has been the case for years.  He is short of breath after walking 125 feet up hill to his mailbox.  He is short of breath walking up a flight of steps.  He does walk 1 mile/day with his wife but stops after about 1/2 mile to rest.  No tachypalpitations.  He has chronic stable angina with substernal mild chest pain that often occurs during his daily walk.  He will rest and it will resolve.  Sometimes he will take a NTG with relief.  He has had this pattern stably for a number of years.  Weight is stable.  No significant post-prandial abdominal pain (has mesenteric artery stenosis).    ECG: A-V sequentially paced  Labs (7/13): LDL 77, HDL 34 Labs (4/14): K 4.3, creatinine 2.0 Labs (5/14): K 4.2, creatinine 1.78 Labs (6/14): LDL 26, HDL 83  PMH; 1. History of DVT 2. HTN: ACEI cough. 3. Hyperlipidemia 4. CAD s/p CABG in 1991 with redo in 1997.  Chronic stable angina.  5. Ischemic cardiomyopathy: Echo in 4/13 with dilated LV, EF 30%, mildly dilated RV with mildly decreased RV function, moderate MR, mild AI.  - St Jude ICD with CRT upgrade in 9/10.  6. Carotid stenosis: 5/14 carotid dopplers with 40-59% bilateral ICA stenosis and occluded right vertebral.   7. GERD 8. CKD 9. OA 10. Paroxysmal atrial fibrillation 11. Mitral regurgitation: Moderate on last echo. 12. VT: s/p ablation in 5/13.  13. H/o diverticulitis 14. H/o cholecystectomy 15. H/o appendectomy 16. Renal artery stenosis: Renal artery dopplers (5/14) with 60-99% proximal right renal artery stenosis as well as severe celiac artery stenosis.   SH: Married, lives in [place], 2 kids, quit smoking in 1974.   FH: Mother, sister,  and brother with CAD.  ROS: All systems reviewed and negative except as per HPI.   Current Outpatient Prescriptions  Medication Sig Dispense Refill  . aspirin 81 MG tablet Take 1 tablet (81 mg total) by mouth every other day.      . carvedilol (COREG) 12.5 MG tablet 1 and 1/2 tablets (total 18.75mg  ) two times a day  270 tablet  3  . diphenhydramine-acetaminophen (TYLENOL PM) 25-500 MG TABS Take 1 tablet by mouth at bedtime.       . docusate sodium (COLACE) 100 MG capsule Take 100 mg by mouth daily as needed. For constipation      . fenofibrate 160 MG tablet TAKE 1 TABLET BY MOUTH EVERY DAY  90 tablet  0  . furosemide (LASIX) 40 MG tablet 80 mg every morning and 40 mg every afternoon  90 tablet  6  . isosorbide mononitrate (IMDUR) 60 MG 24 hr tablet TAKE 1 TABLET BY MOUTH EVERY DAY  90 tablet  0  . NITROSTAT 0.4 MG SL tablet PLACE 1 TABLET UNDER THE TONGUE EVERY 5 MINUTES AS NEEDED FOR CHEST PAIN  25 tablet  0  . omeprazole (PRILOSEC) 20 MG capsule Take 20 mg by mouth daily.       . potassium chloride SA (KLOR-CON M20) 20 MEQ tablet Take 1 tablet (20 mEq total) by mouth 2 (two) times daily.  60 tablet  10  . simvastatin (ZOCOR) 40 MG  tablet Take 40 mg by mouth every evening.      . Tamsulosin HCl (FLOMAX) 0.4 MG CAPS Take 0.4 mg by mouth daily.       . vitamin B-12 (CYANOCOBALAMIN) 500 MCG tablet Take 500 mcg by mouth daily.        Marland Kitchen warfarin (COUMADIN) 5 MG tablet Take as directed by coumadin clinic  30 tablet  3  . losartan (COZAAR) 50 MG tablet Take 1 tablet (50 mg total) by mouth daily.  30 tablet  6  . [DISCONTINUED] triamcinolone (NASACORT AQ) 55 MCG/ACT nasal inhaler Place 2 sprays into the nose as needed.         No current facility-administered medications for this visit.   BP 124/58  Pulse 74  Ht 6\' 1"  (1.854 m)  Wt 90.719 kg (200 lb)  BMI 26.39 kg/m2 General: NAD Neck: No JVD, no thyromegaly or thyroid nodule.  Lungs: Clear to auscultation bilaterally with normal  respiratory effort. CV: Nondisplaced PMI.  Heart regular S1/S2, no S3/S4, 2/6 HSM apex.  No peripheral edema.  R carotid bruit.  Normal pedal pulses.  Abdomen: Soft, nontender, no hepatosplenomegaly, no distention.   Neurologic: Alert and oriented x 3.  Psych: Normal affect. Extremities: No clubbing or cyanosis.   Assessment/Plan:  1. CAD: s/p CABG.  Patient has chronic stable angina.  No change to pattern for a number of years.  He does take a nitroglycerin tab during some of his mile walks with his wife. No chest pain at rest.  - Continue ASA 81, statin, Imdur 60, ARB, and Coreg.   2. Chronic systolic CHF: EF 14% on last echo.  He does not appear volume overloaded on exam.  Continue current Lasix dosing (80 qam, 40 qpm).  Creatinine improved most recently to 1.78.  I will increase losartan to 50 mg daily with BMET/BNP check in 10 days.   3. Hyperlipidemia: Lipids in 6/14 ok.    4. Mitral regurgitation: Moderate on last echo.  5. Carotid stenosis: Repeat carotid dopplers in 5/15.   6. Renal artery stenosis: Unilateral renal artery stenosis.  BP is controlled.  Dr. Rosalyn Charters plan had been to forward study to nephrology for evaluation.  I am not sure there is a lot of utility to opening this artery in the absence of uncontrolled HTN or bilateral stenosis.   7. Celiac artery stenosis: severe celiac stenosis on abdominal dopplers.  He does not describe abdominal angina. Continue to follow.  8. Paroxysmal atrial fibrillation: Patient is on warfarin.  He is in NSR today.  9. CKD: Last creatinine was 1.78.   Marca Ancona 10/16/2012

## 2012-10-18 ENCOUNTER — Ambulatory Visit (INDEPENDENT_AMBULATORY_CARE_PROVIDER_SITE_OTHER): Payer: Medicare Other | Admitting: *Deleted

## 2012-10-18 ENCOUNTER — Encounter: Payer: Self-pay | Admitting: Internal Medicine

## 2012-10-18 ENCOUNTER — Other Ambulatory Visit: Payer: Self-pay | Admitting: Cardiology

## 2012-10-18 DIAGNOSIS — I472 Ventricular tachycardia: Secondary | ICD-10-CM

## 2012-10-18 DIAGNOSIS — Z95810 Presence of automatic (implantable) cardiac defibrillator: Secondary | ICD-10-CM

## 2012-10-18 DIAGNOSIS — I5022 Chronic systolic (congestive) heart failure: Secondary | ICD-10-CM

## 2012-10-19 LAB — REMOTE ICD DEVICE
AL AMPLITUDE: 3.8 mv
AL THRESHOLD: 1 V
BAMS-0001: 150 {beats}/min
BAMS-0003: 70 {beats}/min
BRDY-0002RA: 60 {beats}/min
RV LEAD AMPLITUDE: 12 mv
TZAT-0001FASTVT: 1
TZAT-0001SLOWVT: 1
TZAT-0004FASTVT: 8
TZAT-0004SLOWVT: 8
TZAT-0012FASTVT: 200 ms
TZAT-0019FASTVT: 7.5 V
TZON-0003FASTVT: 280 ms
TZON-0003SLOWVT: 350 ms
TZON-0004FASTVT: 25
TZON-0010SLOWVT: 80 ms
TZST-0001FASTVT: 3
TZST-0001FASTVT: 4
TZST-0001FASTVT: 5
TZST-0001SLOWVT: 3
TZST-0001SLOWVT: 5
TZST-0003FASTVT: 36 J
TZST-0003FASTVT: 40 J
TZST-0003SLOWVT: 36 J
TZST-0003SLOWVT: 40 J

## 2012-10-25 ENCOUNTER — Other Ambulatory Visit (INDEPENDENT_AMBULATORY_CARE_PROVIDER_SITE_OTHER): Payer: Medicare Other

## 2012-10-25 DIAGNOSIS — I10 Essential (primary) hypertension: Secondary | ICD-10-CM

## 2012-10-25 DIAGNOSIS — I4891 Unspecified atrial fibrillation: Secondary | ICD-10-CM

## 2012-10-25 DIAGNOSIS — I5022 Chronic systolic (congestive) heart failure: Secondary | ICD-10-CM

## 2012-10-25 DIAGNOSIS — R0602 Shortness of breath: Secondary | ICD-10-CM

## 2012-10-25 LAB — BASIC METABOLIC PANEL
BUN: 27 mg/dL — ABNORMAL HIGH (ref 6–23)
CO2: 28 mEq/L (ref 19–32)
Calcium: 9.6 mg/dL (ref 8.4–10.5)
GFR: 34.99 mL/min — ABNORMAL LOW (ref >60.00)
Glucose, Bld: 109 mg/dL — ABNORMAL HIGH (ref 70–99)

## 2012-11-05 ENCOUNTER — Ambulatory Visit (INDEPENDENT_AMBULATORY_CARE_PROVIDER_SITE_OTHER): Payer: Medicare Other | Admitting: *Deleted

## 2012-11-05 DIAGNOSIS — Z7901 Long term (current) use of anticoagulants: Secondary | ICD-10-CM

## 2012-11-05 DIAGNOSIS — I4891 Unspecified atrial fibrillation: Secondary | ICD-10-CM

## 2012-11-05 DIAGNOSIS — I059 Rheumatic mitral valve disease, unspecified: Secondary | ICD-10-CM

## 2012-11-08 ENCOUNTER — Encounter: Payer: Self-pay | Admitting: *Deleted

## 2012-11-25 ENCOUNTER — Other Ambulatory Visit: Payer: Self-pay | Admitting: Dermatology

## 2012-11-26 ENCOUNTER — Other Ambulatory Visit: Payer: Self-pay | Admitting: Internal Medicine

## 2012-12-10 ENCOUNTER — Other Ambulatory Visit: Payer: Self-pay | Admitting: Cardiology

## 2012-12-16 ENCOUNTER — Other Ambulatory Visit: Payer: Self-pay | Admitting: Cardiology

## 2012-12-17 ENCOUNTER — Ambulatory Visit (INDEPENDENT_AMBULATORY_CARE_PROVIDER_SITE_OTHER): Payer: Medicare Other | Admitting: *Deleted

## 2012-12-17 DIAGNOSIS — I4891 Unspecified atrial fibrillation: Secondary | ICD-10-CM

## 2012-12-17 DIAGNOSIS — Z7901 Long term (current) use of anticoagulants: Secondary | ICD-10-CM

## 2012-12-17 DIAGNOSIS — I059 Rheumatic mitral valve disease, unspecified: Secondary | ICD-10-CM

## 2012-12-23 ENCOUNTER — Other Ambulatory Visit: Payer: Self-pay | Admitting: Family Medicine

## 2012-12-23 ENCOUNTER — Other Ambulatory Visit: Payer: Self-pay | Admitting: Cardiology

## 2013-01-06 ENCOUNTER — Other Ambulatory Visit: Payer: Self-pay | Admitting: Cardiology

## 2013-01-17 ENCOUNTER — Ambulatory Visit (INDEPENDENT_AMBULATORY_CARE_PROVIDER_SITE_OTHER): Payer: Medicare Other | Admitting: Cardiology

## 2013-01-17 ENCOUNTER — Encounter: Payer: Self-pay | Admitting: Cardiology

## 2013-01-17 ENCOUNTER — Ambulatory Visit (INDEPENDENT_AMBULATORY_CARE_PROVIDER_SITE_OTHER): Payer: Medicare Other | Admitting: *Deleted

## 2013-01-17 ENCOUNTER — Encounter: Payer: Self-pay | Admitting: Internal Medicine

## 2013-01-17 ENCOUNTER — Encounter: Payer: Self-pay | Admitting: *Deleted

## 2013-01-17 VITALS — BP 126/58 | HR 64 | Ht 73.5 in | Wt 199.0 lb

## 2013-01-17 DIAGNOSIS — I4891 Unspecified atrial fibrillation: Secondary | ICD-10-CM

## 2013-01-17 DIAGNOSIS — Z7901 Long term (current) use of anticoagulants: Secondary | ICD-10-CM

## 2013-01-17 DIAGNOSIS — I10 Essential (primary) hypertension: Secondary | ICD-10-CM

## 2013-01-17 DIAGNOSIS — I25810 Atherosclerosis of coronary artery bypass graft(s) without angina pectoris: Secondary | ICD-10-CM

## 2013-01-17 DIAGNOSIS — I5022 Chronic systolic (congestive) heart failure: Secondary | ICD-10-CM

## 2013-01-17 DIAGNOSIS — I739 Peripheral vascular disease, unspecified: Secondary | ICD-10-CM

## 2013-01-17 DIAGNOSIS — M79609 Pain in unspecified limb: Secondary | ICD-10-CM

## 2013-01-17 DIAGNOSIS — E785 Hyperlipidemia, unspecified: Secondary | ICD-10-CM

## 2013-01-17 DIAGNOSIS — I059 Rheumatic mitral valve disease, unspecified: Secondary | ICD-10-CM

## 2013-01-17 LAB — POCT INR: INR: 2.6

## 2013-01-17 MED ORDER — CARVEDILOL 25 MG PO TABS: 25.0000 mg | ORAL_TABLET | Freq: Two times a day (BID) | ORAL | Status: DC

## 2013-01-17 NOTE — Patient Instructions (Signed)
Increase coreg(carvedilol) to 25mg  two times a day.   Your physician has requested that you have a lower extremity arterial duplex. This test is an ultrasound of the arteries in the legs. It looks at arterial blood flow in the legs. Allow one hour for Lower Arterial scans. There are no restrictions or special instructions  Your physician recommends that you schedule a follow-up appointment in: 3 months with Dr Shirlee Latch in the [facility] at [facility].   Your physician recommends that you return for lab work in: 3 months--BMET.

## 2013-01-18 DIAGNOSIS — M79606 Pain in leg, unspecified: Secondary | ICD-10-CM | POA: Insufficient documentation

## 2013-01-18 NOTE — Progress Notes (Signed)
Patient ID: Sean [name], male   DOB: 07-Dec-1931, 79 y.o.   MRN: [id number] PCP: Dr. Caryl Never  79 yo with history of CAD s/p CABG, ischemic cardiomyopathy, CKD, and paroxysmal atrial fibrillation presents for cardiology followup.  Since last appointment, he has been stable.  He tires easily but this has been the case for years.  He is short of breath after walking 125 feet up hill to his mailbox.  He is short of breath walking up a flight of steps.  No tachypalpitations.  He has chronic stable angina with substernal mild chest pain that occasionally occurs when he walks longer distances.  He will rest and it will resolve.  Sometimes he will take a NTG with relief.  He has had this pattern stably for a number of years.  Weight is stable.  No significant post-prandial abdominal pain (has mesenteric artery stenosis). Main problem over the last few weeks has been bilateral hip pain that radiates down his legs.  He seems to get this when he sits for a long time, but he also gets the pain with walking.  He has stopped doing his 1 mile walk with his wife because of the hip and leg pain.   Labs (7/13): LDL 77, HDL 34 Labs (4/14): K 4.3, creatinine 2.0 Labs (5/14): K 4.2, creatinine 1.78 Labs (6/14): LDL 26, HDL 83 Labs (9/14): K 3.6, creatinine 2.0, BNP 308  PMH; 1. History of DVT 2. HTN: ACEI cough. 3. Hyperlipidemia 4. CAD s/p CABG in 1991 with redo in 1997.  Chronic stable angina.  5. Ischemic cardiomyopathy: Echo in 4/13 with dilated LV, EF 30%, mildly dilated RV with mildly decreased RV function, moderate MR, mild AI.  - St Jude ICD with CRT upgrade in 9/10.  6. Carotid stenosis: 5/14 carotid dopplers with 40-59% bilateral ICA stenosis and occluded right vertebral.   7. GERD 8. CKD 9. OA 10. Paroxysmal atrial fibrillation 11. Mitral regurgitation: Moderate on last echo. 12. VT: s/p ablation in 5/13.  13. H/o diverticulitis 14. H/o cholecystectomy 15. H/o appendectomy 16. Renal artery  stenosis: Renal artery dopplers (5/14) with 60-99% proximal right renal artery stenosis as well as severe celiac artery stenosis.   SH: Married, lives in [place], 2 kids, quit smoking in 1974.   FH: Mother, sister, and brother with CAD.  ROS: All systems reviewed and negative except as per HPI.   Current Outpatient Prescriptions  Medication Sig Dispense Refill  . aspirin 81 MG tablet Take 1 tablet (81 mg total) by mouth every other day.      . diphenhydramine-acetaminophen (TYLENOL PM) 25-500 MG TABS Take 1 tablet by mouth at bedtime.       . docusate sodium (COLACE) 100 MG capsule Take 100 mg by mouth daily as needed. For constipation      . fenofibrate 160 MG tablet TAKE 1 TABLET BY MOUTH EVERY DAY  60 tablet  0  . furosemide (LASIX) 40 MG tablet 80 mg every morning and 40 mg every afternoon  90 tablet  6  . isosorbide mononitrate (IMDUR) 60 MG 24 hr tablet TAKE 1 TABLET BY MOUTH EVERY DAY  90 tablet  0  . losartan (COZAAR) 50 MG tablet Take 1 tablet (50 mg total) by mouth daily.  30 tablet  6  . NITROSTAT 0.4 MG SL tablet PLACE 1 TABLET UNDER THE TONGUE EVERY 5 MINUTES AS NEEDED FOR CHEST PAIN  25 tablet  0  . omeprazole (PRILOSEC) 20 MG capsule Take 20 mg  by mouth daily.       . potassium chloride SA (K-DUR,KLOR-CON) 20 MEQ tablet TAKE 1 TABLET BY MOUTH TWICE DAILY  60 tablet  0  . simvastatin (ZOCOR) 40 MG tablet Take 40 mg by mouth every evening.      . Tamsulosin HCl (FLOMAX) 0.4 MG CAPS Take 0.4 mg by mouth daily.       . vitamin B-12 (CYANOCOBALAMIN) 500 MCG tablet Take 500 mcg by mouth daily.        Marland Kitchen warfarin (COUMADIN) 5 MG tablet TAKE AS DIRECTED BY COUMADIN CLINIC  30 tablet  3  . carvedilol (COREG) 25 MG tablet Take 1 tablet (25 mg total) by mouth 2 (two) times daily.  180 tablet  3  . [DISCONTINUED] triamcinolone (NASACORT AQ) 55 MCG/ACT nasal inhaler Place 2 sprays into the nose as needed.         No current facility-administered medications for this visit.   BP  126/58  Pulse 64  Ht 6' 1.5" (1.867 m)  Wt 90.266 kg (199 lb)  BMI 25.90 kg/m2 General: NAD Neck: No JVD, no thyromegaly or thyroid nodule.  Lungs: Clear to auscultation bilaterally with normal respiratory effort. CV: Nondisplaced PMI.  Heart regular S1/S2, no S3/S4, 2/6 HSM apex.  No peripheral edema.  R carotid bruit.  2+ left PT, unable to palpate right PT.  Abdomen: Soft, nontender, no hepatosplenomegaly, no distention.   Neurologic: Alert and oriented x 3.  Psych: Normal affect. Extremities: No clubbing or cyanosis.   Assessment/Plan:  1. CAD: s/p CABG.  Patient has chronic stable angina.  No change to pattern for a number of years.  He does take a nitroglycerin tab during some of his mile walks with his wife. No chest pain at rest.  - Continue ASA 81, statin, Imdur 60, ARB, and Coreg.   2. Chronic systolic CHF: EF 16% on last echo.  He does not appear volume overloaded on exam.  Continue current Lasix dosing (80 qam, 40 qpm).  Creatinine is 2.0, will not titrate losartan at this time.  I will increase Coreg to 25 mg bid.  3. Hyperlipidemia: Lipids in 6/14 ok.    4. Mitral regurgitation: Moderate on last echo.  5. Carotid stenosis: Repeat carotid dopplers in 5/15.   6. Renal artery stenosis: Unilateral renal artery stenosis.  BP is controlled.  Dr. Rosalyn Charters plan prior had been to forward study to nephrology for evaluation.  I am not sure there is a lot of utility to opening this artery in the absence of uncontrolled HTN or bilateral stenosis.   7. Celiac artery stenosis: severe celiac stenosis on abdominal dopplers.  He does not describe abdominal angina. Continue to follow.  8. Paroxysmal atrial fibrillation: Patient is on warfarin.  He is in NSR today.  9. CKD: Last creatinine was 2.0.  Patient has followup with nephrology. 10.  Hip and leg pain: This may be radiating from his lumbar spine.  It occurs with prolonged sitting and with exertion.  I am unable to feel the pulses in his  right foot so will get ABIs to determine if PAD might play a role.    Marca Ancona 01/18/2013

## 2013-01-23 LAB — MDC_IDC_ENUM_SESS_TYPE_REMOTE
Battery Remaining Longevity: 5 mo
Battery Remaining Percentage: 11 %
Brady Statistic AP VS Percent: 1 %
Brady Statistic AS VS Percent: 1 %
Brady Statistic RA Percent Paced: 45 %
HighPow Impedance: 43 Ohm
Lead Channel Impedance Value: 380 Ohm
Lead Channel Pacing Threshold Amplitude: 3 V
Lead Channel Pacing Threshold Pulse Width: 0.5 ms
Lead Channel Pacing Threshold Pulse Width: 1.5 ms
Lead Channel Sensing Intrinsic Amplitude: 3.2 mV
Lead Channel Setting Pacing Amplitude: 2 V
Lead Channel Setting Pacing Pulse Width: 0.5 ms
Zone Setting Detection Interval: 250 ms
Zone Setting Detection Interval: 350 ms

## 2013-01-25 ENCOUNTER — Ambulatory Visit (INDEPENDENT_AMBULATORY_CARE_PROVIDER_SITE_OTHER): Payer: Medicare Other | Admitting: Family Medicine

## 2013-01-25 ENCOUNTER — Encounter: Payer: Self-pay | Admitting: Family Medicine

## 2013-01-25 ENCOUNTER — Encounter: Payer: Self-pay | Admitting: Cardiology

## 2013-01-25 ENCOUNTER — Ambulatory Visit (HOSPITAL_COMMUNITY): Payer: Medicare Other | Attending: Cardiology

## 2013-01-25 ENCOUNTER — Ambulatory Visit (INDEPENDENT_AMBULATORY_CARE_PROVIDER_SITE_OTHER)
Admission: RE | Admit: 2013-01-25 | Discharge: 2013-01-25 | Disposition: A | Discharge status: Home / Self Care | Payer: Medicare Other | Source: Ambulatory Visit | Attending: Family Medicine | Admitting: Family Medicine

## 2013-01-25 VITALS — BP 120/64 | HR 121 | Temp 97.9°F | Wt 203.0 lb

## 2013-01-25 DIAGNOSIS — I2510 Atherosclerotic heart disease of native coronary artery without angina pectoris: Secondary | ICD-10-CM | POA: Insufficient documentation

## 2013-01-25 DIAGNOSIS — M545 Low back pain, unspecified: Secondary | ICD-10-CM

## 2013-01-25 DIAGNOSIS — E785 Hyperlipidemia, unspecified: Secondary | ICD-10-CM | POA: Insufficient documentation

## 2013-01-25 DIAGNOSIS — I10 Essential (primary) hypertension: Secondary | ICD-10-CM | POA: Insufficient documentation

## 2013-01-25 DIAGNOSIS — M79604 Pain in right leg: Secondary | ICD-10-CM

## 2013-01-25 DIAGNOSIS — I4891 Unspecified atrial fibrillation: Secondary | ICD-10-CM

## 2013-01-25 DIAGNOSIS — R0989 Other specified symptoms and signs involving the circulatory and respiratory systems: Secondary | ICD-10-CM | POA: Insufficient documentation

## 2013-01-25 DIAGNOSIS — I739 Peripheral vascular disease, unspecified: Secondary | ICD-10-CM

## 2013-01-25 DIAGNOSIS — M79609 Pain in unspecified limb: Secondary | ICD-10-CM

## 2013-01-25 DIAGNOSIS — Z87891 Personal history of nicotine dependence: Secondary | ICD-10-CM | POA: Insufficient documentation

## 2013-01-25 DIAGNOSIS — Z951 Presence of aortocoronary bypass graft: Secondary | ICD-10-CM | POA: Insufficient documentation

## 2013-01-25 DIAGNOSIS — I5022 Chronic systolic (congestive) heart failure: Secondary | ICD-10-CM

## 2013-01-25 DIAGNOSIS — I25810 Atherosclerosis of coronary artery bypass graft(s) without angina pectoris: Secondary | ICD-10-CM

## 2013-01-25 NOTE — Progress Notes (Signed)
Subjective:    Patient ID: Sean [name], male    DOB: 1931/08/16, 79 y.o.   MRN: [id number]  HPI Patient seen with one month history of progressive bilateral lower extremity pain. He recently saw his cardiologist who noted somewhat diminished pulses right foot compared to left. Arterial Dopplers this morning revealed normal bilateral ABIs.  He relates symptoms both at rest and with activity. His pain is mostly posterior thigh region but radiates down all the way toward the foot. No weakness. No loss of urine or stool control. He denies any upper extremity pains. He is describing pain that is fairly diffuse lower extremities and not confined in joints. Has taken Tylenol without much relief. Pain is sometimes worse with walking but occasionally with sitting. No numbness. No history of any known lumbar stenosis. No recent x-rays. Denies any recent injury. Denies any muscle cramps. He does take statin with simvastatin has been on this for several years with no prior history of myalgias.  He has multiple chronic problems including history of CAD, systolic heart failure, hypertension, atrial fibrillation, chronic kidney disease, GERD.  Past Medical History  Diagnosis Date  . HYPERLIPIDEMIA 04/07/2008  . HYPERTENSION 04/07/2008  . CAD, ARTERY BYPASS GRAFT 05/22/2008  . CARDIOMYOPATHY, ISCHEMIC 05/22/2008  . MITRAL VALVE PROLAPSE, NON-RHEUMATIC 05/22/2008  . AV BLOCK 05/22/2008  . Chronic systolic heart failure 10/13/2008  . CAROTID ARTERY STENOSIS 11/15/2009  . GERD 04/07/2008  . RENAL INSUFFICIENCY 10/17/2008  . DEGENERATIVE JOINT DISEASE, LEFT KNEE 04/07/2008  . COLONIC POLYPS, HX OF 04/07/2008  . DIVERTICULITIS, HX OF 04/07/2008  . Atrial fibrillation 04/04/2010  . Decreased left ventricular function   . Ventricular tachycardia     Status post catheter ablation may 2013  . CHF (congestive heart failure)     class 2 to 3   Past Surgical History  Procedure Laterality Date  . Cardiac surgery     open heart 1991, 1997, pacemaker insertion St Jude 2010 CD 3231  . Lung surgery  1987  . Hernia repair  1952    2004  . Cholecystectomy    . Appendectomy    . Thoracotomy      secondary to lung mass  . Insert / replace / remove pacemaker      St Jude unify CD 3231/2010    reports that he quit smoking about 40 years ago. His smoking use included Cigarettes. He has a 15 pack-year smoking history. He has never used smokeless tobacco. He reports that he does not drink alcohol or use illicit drugs. family history includes Coronary artery disease in his mother; Heart disease in his brother, mother, and sister. Allergies  Allergen Reactions  . Carisoprodol     REACTION: rash  . Lisinopril     REACTION: cough  . Tape     Cloth Tape tears skin  . Zolpidem Tartrate Other (See Comments)    Altered mental status  . Penicillins Rash       Review of Systems  Constitutional: Negative for fever, chills, appetite change and unexpected weight change.  Respiratory: Negative for cough and shortness of breath.   Cardiovascular: Negative for chest pain.  Gastrointestinal: Negative for abdominal pain.  Musculoskeletal: Positive for myalgias. Negative for gait problem.  Neurological: Negative for weakness and numbness.  Hematological: Negative for adenopathy.       Objective:   Physical Exam  Constitutional: He appears well-developed and well-nourished.  Cardiovascular: Normal rate.   Pulmonary/Chest: Effort normal and breath sounds normal.  No respiratory distress. He has no wheezes. He has no rales.  Musculoskeletal: He exhibits no edema.  Straight leg raises are negative bilaterally. Feet are warm to touch. Good capillary refill. Slightly diminished right dorsalis pedis pulse compared to left. Good range of motion both hips  Neurological:  Full-strength lower extremities with plantar flexion, dorsiflexion, and knee extension. Deep tendon reflexes are 2+ and symmetric knee and 1+ symmetric  ankle bilaterally          Assessment & Plan:  Patient presents with bilateral lower extremity pain with symptoms occurring both at rest and with activity. No evidence for claudication with recent ABIs normal. Symptoms do not fit for polymyalgia rheumatica. He's not describing any muscle cramps. Question lumbar stenosis, though would not be classic presentation as he has symptoms frequently at rest. Given duration and progressive nature of pain lumbar spine films.

## 2013-01-25 NOTE — Progress Notes (Signed)
Pre visit review using our clinic review tool, if applicable. No additional management support is needed unless otherwise documented below in the visit note. 

## 2013-01-26 ENCOUNTER — Other Ambulatory Visit: Payer: Self-pay | Admitting: Family Medicine

## 2013-01-26 DIAGNOSIS — M545 Low back pain: Secondary | ICD-10-CM

## 2013-02-04 ENCOUNTER — Other Ambulatory Visit: Payer: Self-pay | Admitting: Cardiology

## 2013-02-05 ENCOUNTER — Other Ambulatory Visit: Payer: Self-pay | Admitting: Internal Medicine

## 2013-02-16 ENCOUNTER — Encounter: Payer: Self-pay | Admitting: *Deleted

## 2013-02-20 ENCOUNTER — Other Ambulatory Visit: Payer: Self-pay | Admitting: Family Medicine

## 2013-02-28 ENCOUNTER — Ambulatory Visit (INDEPENDENT_AMBULATORY_CARE_PROVIDER_SITE_OTHER): Payer: Medicare Other

## 2013-02-28 DIAGNOSIS — Z7901 Long term (current) use of anticoagulants: Secondary | ICD-10-CM

## 2013-02-28 DIAGNOSIS — I4891 Unspecified atrial fibrillation: Secondary | ICD-10-CM

## 2013-02-28 DIAGNOSIS — Z5181 Encounter for therapeutic drug level monitoring: Secondary | ICD-10-CM | POA: Insufficient documentation

## 2013-02-28 DIAGNOSIS — I059 Rheumatic mitral valve disease, unspecified: Secondary | ICD-10-CM

## 2013-02-28 LAB — POCT INR: INR: 2.2

## 2013-03-04 ENCOUNTER — Other Ambulatory Visit: Payer: Self-pay | Admitting: Cardiology

## 2013-03-06 ENCOUNTER — Other Ambulatory Visit: Payer: Self-pay | Admitting: Cardiology

## 2013-03-21 ENCOUNTER — Other Ambulatory Visit: Payer: Self-pay | Admitting: Cardiology

## 2013-04-03 ENCOUNTER — Other Ambulatory Visit: Payer: Self-pay | Admitting: Cardiology

## 2013-04-03 ENCOUNTER — Other Ambulatory Visit: Payer: Self-pay | Admitting: Family Medicine

## 2013-04-10 ENCOUNTER — Other Ambulatory Visit: Payer: Self-pay | Admitting: Family Medicine

## 2013-04-11 ENCOUNTER — Ambulatory Visit (INDEPENDENT_AMBULATORY_CARE_PROVIDER_SITE_OTHER): Payer: Medicare Other | Admitting: *Deleted

## 2013-04-11 DIAGNOSIS — I059 Rheumatic mitral valve disease, unspecified: Secondary | ICD-10-CM

## 2013-04-11 DIAGNOSIS — Z7901 Long term (current) use of anticoagulants: Secondary | ICD-10-CM

## 2013-04-11 DIAGNOSIS — I4891 Unspecified atrial fibrillation: Secondary | ICD-10-CM

## 2013-04-11 DIAGNOSIS — Z5181 Encounter for therapeutic drug level monitoring: Secondary | ICD-10-CM

## 2013-04-11 LAB — POCT INR: INR: 2.4

## 2013-04-18 ENCOUNTER — Encounter: Payer: Self-pay | Admitting: Cardiology

## 2013-04-18 ENCOUNTER — Ambulatory Visit: Payer: Medicare Other | Admitting: Cardiology

## 2013-04-18 ENCOUNTER — Ambulatory Visit (INDEPENDENT_AMBULATORY_CARE_PROVIDER_SITE_OTHER): Payer: Medicare Other | Admitting: Cardiology

## 2013-04-18 ENCOUNTER — Encounter: Payer: Self-pay | Admitting: *Deleted

## 2013-04-18 VITALS — BP 118/68 | HR 60 | Ht 73.5 in | Wt 203.0 lb

## 2013-04-18 DIAGNOSIS — N183 Chronic kidney disease, stage 3 unspecified: Secondary | ICD-10-CM

## 2013-04-18 DIAGNOSIS — E785 Hyperlipidemia, unspecified: Secondary | ICD-10-CM

## 2013-04-18 DIAGNOSIS — I059 Rheumatic mitral valve disease, unspecified: Secondary | ICD-10-CM

## 2013-04-18 DIAGNOSIS — I4891 Unspecified atrial fibrillation: Secondary | ICD-10-CM

## 2013-04-18 DIAGNOSIS — M79606 Pain in leg, unspecified: Secondary | ICD-10-CM

## 2013-04-18 DIAGNOSIS — I25810 Atherosclerosis of coronary artery bypass graft(s) without angina pectoris: Secondary | ICD-10-CM

## 2013-04-18 DIAGNOSIS — I340 Nonrheumatic mitral (valve) insufficiency: Secondary | ICD-10-CM

## 2013-04-18 DIAGNOSIS — I6529 Occlusion and stenosis of unspecified carotid artery: Secondary | ICD-10-CM

## 2013-04-18 DIAGNOSIS — M79609 Pain in unspecified limb: Secondary | ICD-10-CM

## 2013-04-18 DIAGNOSIS — I5022 Chronic systolic (congestive) heart failure: Secondary | ICD-10-CM

## 2013-04-18 MED ORDER — ISOSORBIDE MONONITRATE ER 30 MG PO TB24: 30.0000 mg | ORAL_TABLET | Freq: Every day | ORAL | Status: DC

## 2013-04-18 NOTE — Patient Instructions (Addendum)
Increase Imdur to 90mg  daily. This will be 1 of your 60mg  tablets and 1 of your 30mg  tablets daily at the same time.   Your physician recommends that you return for a FASTING lipid profile /BMET in the next week or so.  Your physician has requested that you have a carotid duplex. This test is an ultrasound of the carotid arteries in your neck. It looks at blood flow through these arteries that supply the brain with blood. Allow one hour for this exam. There are no restrictions or special instructions. MAY 2015  Your physician recommends that you schedule a follow-up appointment in: 4 months with Dr Aundra Dubin.

## 2013-04-18 NOTE — Progress Notes (Signed)
Patient ID: Sean [name], male   DOB: February 15, 1931, 80 y.o.   MRN: [id number] PCP: Dr. Elease Hashimoto  80 yo with history of CAD s/p CABG, ischemic cardiomyopathy, CKD, and paroxysmal atrial fibrillation presents for cardiology followup.  Since last appointment, he has been stable.  He tires easily but this has been the case for years.  He is short of breath after walking 125 feet up hill to his mailbox.  He is short of breath walking up a flight of steps.  No tachypalpitations.  He has chronic stable angina with substernal mild chest pain that occasionally occurs when he walks longer distances.  He will rest and it will resolve.  Sometimes he will take a NTG with relief.  He has had this pattern stably for a number of years.  Weight is up about 4 lbs.  No significant post-prandial abdominal pain (has mesenteric artery stenosis). Main problem over the last few weeks has been bilateral hip pain that radiates down his legs.  ABIs done recently were normal and this pain is thought to be sciatica.   Labs (7/13): LDL 77, HDL 34 Labs (4/14): K 4.3, creatinine 2.0 Labs (5/14): K 4.2, creatinine 1.78 Labs (6/14): LDL 26, HDL 83 Labs (9/14): K 3.6, creatinine 2.0, BNP 308  PMH; 1. History of DVT 2. HTN: ACEI cough. 3. Hyperlipidemia 4. CAD s/p CABG in 1991 with redo in 1997.  Chronic stable angina.  5. Ischemic cardiomyopathy: Echo in 4/13 with dilated LV, EF 30%, mildly dilated RV with mildly decreased RV function, moderate MR, mild AI.  - St Jude ICD with CRT upgrade in 9/10.  6. Carotid stenosis: 5/14 carotid dopplers with 40-59% bilateral ICA stenosis and occluded right vertebral.   7. GERD 8. CKD 9. OA 10. Paroxysmal atrial fibrillation 11. Mitral regurgitation: Moderate on last echo. 12. VT: s/p ablation in 5/13.  13. H/o diverticulitis 14. H/o cholecystectomy 15. H/o appendectomy 16. Renal artery stenosis: Renal artery dopplers (5/14) with 60-99% proximal right renal artery stenosis as well as  severe celiac artery stenosis.  17. ABIs (12/14) were normal. 18. Low back pain with sciatica  SH: Married, lives in [place], 2 kids, quit smoking in 1974.   FH: Mother, sister, and brother with CAD.  ROS: All systems reviewed and negative except as per HPI.   Current Outpatient Prescriptions  Medication Sig Dispense Refill  . aspirin 81 MG tablet Take 1 tablet (81 mg total) by mouth every other day.      . carvedilol (COREG) 25 MG tablet Take 1 tablet (25 mg total) by mouth 2 (two) times daily.  180 tablet  3  . diphenhydramine-acetaminophen (TYLENOL PM) 25-500 MG TABS Take 1 tablet by mouth at bedtime.       . docusate sodium (COLACE) 100 MG capsule Take 100 mg by mouth daily as needed. For constipation      . fenofibrate 160 MG tablet TAKE 1 TABLET BY MOUTH EVERY DAY  60 tablet  3  . furosemide (LASIX) 40 MG tablet TAKE 2 TABLETS BY MOUTH EVERY MORNING AND 1 TABLET BY MOUTH EVERY AFTERNOON  90 tablet  3  . isosorbide mononitrate (IMDUR) 60 MG 24 hr tablet TAKE 1 TABLET BY MOUTH EVERY DAY  90 tablet  0  . losartan (COZAAR) 50 MG tablet Take 1 tablet (50 mg total) by mouth daily.  30 tablet  6  . NITROSTAT 0.4 MG SL tablet PLACE 1 TABLET UNDER THE TONGUE EVERY 5 MINUTES AS NEEDED FOR  CHEST PAIN  25 tablet  0  . omeprazole (PRILOSEC) 20 MG capsule TAKE 1 CAPSULE BY MOUTH TWICE DAILY  180 capsule  2  . potassium chloride SA (K-DUR,KLOR-CON) 20 MEQ tablet TAKE 1 TABLET BY MOUTH TWICE DAILY  60 tablet  0  . simvastatin (ZOCOR) 40 MG tablet TAKE 1 TABLET BY MOUTH EVERY NIGHT AT BEDTIME  90 tablet  3  . Tamsulosin HCl (FLOMAX) 0.4 MG CAPS Take 0.4 mg by mouth daily.       . vitamin B-12 (CYANOCOBALAMIN) 500 MCG tablet Take 500 mcg by mouth daily.        Marland Kitchen warfarin (COUMADIN) 5 MG tablet TAKE AS DIRECTED BY COUMADIN CLINIC  30 tablet  3  . isosorbide mononitrate (IMDUR) 30 MG 24 hr tablet Take 1 tablet (30 mg total) by mouth daily.  30 tablet  6  . [DISCONTINUED] triamcinolone (NASACORT AQ)  55 MCG/ACT nasal inhaler Place 2 sprays into the nose as needed.         No current facility-administered medications for this visit.   BP 118/68  Pulse 60  Ht 6' 1.5" (1.867 m)  Wt 92.08 kg (203 lb)  BMI 26.42 kg/m2 General: NAD Neck: No JVD, no thyromegaly or thyroid nodule.  Lungs: Clear to auscultation bilaterally with normal respiratory effort. CV: Nondisplaced PMI.  Heart regular S1/S2, no S3/S4, 1/6 HSM apex.  No peripheral edema.  R carotid bruit.  2+ left PT, unable to palpate right PT.  Abdomen: Soft, nontender, no hepatosplenomegaly, no distention.   Neurologic: Alert and oriented x 3.  Psych: Normal affect. Extremities: No clubbing or cyanosis.   Assessment/Plan:  1. CAD: s/p CABG.  Patient has chronic stable angina.  No change to pattern for a number of years.  He does take a nitroglycerin tab rarely, always with exertion. No chest pain at rest.  - Continue ASA 81, statin, ARB, and Coreg.   - I will have him increase Imdur to 90 mg daily to see if this raises anginal threshold.  2. Chronic systolic CHF: EF 10% on last echo.  He does not appear volume overloaded on exam.  Continue current Lasix dosing (80 qam, 40 qpm).  Given CKD, will not titrate losartan at this time.  Continue current Coreg (at goal dose).  He has a [employer] CRT-D device. I will get a BMET today. 3. Hyperlipidemia: Lipids in 6/14 ok.  Repeat lipids today.  4. Mitral regurgitation: Moderate on last echo.  5. Carotid stenosis: Repeat carotid dopplers in 5/15.   6. Renal artery stenosis: Unilateral renal artery stenosis.  BP is controlled.  Dr. Maren Beach plan prior had been to forward study to nephrology for evaluation.  I am not sure there is a lot of utility to opening this artery in the absence of uncontrolled HTN or bilateral stenosis.   7. Celiac artery stenosis: Severe celiac stenosis on abdominal dopplers.  He does not describe abdominal angina. Continue to follow.  8. Paroxysmal atrial fibrillation:  Patient is on warfarin.  He is in NSR today.  9. CKD: Last creatinine was 2.0.  Patient has followup with nephrology.  As above, BMET today.  10.  Hip and leg pain: ABIs normal.  I suspect that this is sciatica.  Sean [name] 04/18/2013

## 2013-04-20 ENCOUNTER — Ambulatory Visit (INDEPENDENT_AMBULATORY_CARE_PROVIDER_SITE_OTHER): Payer: Medicare Other | Admitting: *Deleted

## 2013-04-20 DIAGNOSIS — I2589 Other forms of chronic ischemic heart disease: Secondary | ICD-10-CM

## 2013-04-20 DIAGNOSIS — I5022 Chronic systolic (congestive) heart failure: Secondary | ICD-10-CM

## 2013-04-21 ENCOUNTER — Encounter: Payer: Self-pay | Admitting: Internal Medicine

## 2013-04-21 LAB — MDC_IDC_ENUM_SESS_TYPE_REMOTE
Battery Remaining Longevity: 1 mo
Battery Voltage: 2.63 V
Brady Statistic AP VP Percent: 50 %
Brady Statistic AS VS Percent: 1 %
Date Time Interrogation Session: 20150319122853
HighPow Impedance: 42 Ohm
Implantable Pulse Generator Serial Number: 726856
Lead Channel Impedance Value: 380 Ohm
Lead Channel Impedance Value: 530 Ohm
Lead Channel Pacing Threshold Amplitude: 3 V
Lead Channel Pacing Threshold Pulse Width: 0.5 ms
Lead Channel Pacing Threshold Pulse Width: 1.5 ms
Lead Channel Sensing Intrinsic Amplitude: 3 mV
Lead Channel Setting Pacing Amplitude: 2 V
Lead Channel Setting Pacing Amplitude: 2.5 V
Lead Channel Setting Pacing Pulse Width: 0.5 ms
Lead Channel Setting Pacing Pulse Width: 1.5 ms
MDC IDC MSMT BATTERY REMAINING PERCENTAGE: 5 %
MDC IDC MSMT LEADCHNL LV IMPEDANCE VALUE: 380 Ohm
MDC IDC MSMT LEADCHNL RA PACING THRESHOLD AMPLITUDE: 1 V
MDC IDC MSMT LEADCHNL RA PACING THRESHOLD PULSEWIDTH: 0.5 ms
MDC IDC MSMT LEADCHNL RV PACING THRESHOLD AMPLITUDE: 0.75 V
MDC IDC MSMT LEADCHNL RV SENSING INTR AMPL: 12 mV
MDC IDC SET LEADCHNL LV PACING AMPLITUDE: 3.75 V
MDC IDC SET LEADCHNL RV SENSING SENSITIVITY: 0.5 mV
MDC IDC SET ZONE DETECTION INTERVAL: 350 ms
MDC IDC STAT BRADY AP VS PERCENT: 1 %
MDC IDC STAT BRADY AS VP PERCENT: 49 %
MDC IDC STAT BRADY RA PERCENT PACED: 49 %
Zone Setting Detection Interval: 250 ms
Zone Setting Detection Interval: 280 ms

## 2013-04-22 ENCOUNTER — Other Ambulatory Visit (INDEPENDENT_AMBULATORY_CARE_PROVIDER_SITE_OTHER): Payer: Medicare Other

## 2013-04-22 DIAGNOSIS — I4891 Unspecified atrial fibrillation: Secondary | ICD-10-CM

## 2013-04-22 DIAGNOSIS — I5022 Chronic systolic (congestive) heart failure: Secondary | ICD-10-CM

## 2013-04-22 DIAGNOSIS — I6529 Occlusion and stenosis of unspecified carotid artery: Secondary | ICD-10-CM

## 2013-04-22 LAB — BASIC METABOLIC PANEL
BUN: 30 mg/dL — AB (ref 6–23)
CHLORIDE: 101 meq/L (ref 96–112)
CO2: 33 mEq/L — ABNORMAL HIGH (ref 19–32)
Calcium: 9.6 mg/dL (ref 8.4–10.5)
Creatinine, Ser: 2 mg/dL — ABNORMAL HIGH (ref 0.4–1.5)
GFR: 35.15 mL/min — ABNORMAL LOW (ref >60.00)
Glucose, Bld: 101 mg/dL — ABNORMAL HIGH (ref 70–99)
Potassium: 4 mEq/L (ref 3.5–5.1)
Sodium: 139 mEq/L (ref 135–145)

## 2013-04-22 LAB — LIPID PANEL
CHOLESTEROL: 146 mg/dL (ref 0–200)
HDL: 31 mg/dL — ABNORMAL LOW (ref >39.00)
LDL Cholesterol: 74 mg/dL (ref 0–99)
TRIGLYCERIDES: 206 mg/dL — AB (ref 0.0–149.0)
Total CHOL/HDL Ratio: 5
VLDL: 41.2 mg/dL — AB (ref 0.0–40.0)

## 2013-05-02 ENCOUNTER — Other Ambulatory Visit: Payer: Self-pay | Admitting: Cardiology

## 2013-05-02 NOTE — Progress Notes (Signed)
ICD remote with ICM 

## 2013-05-04 ENCOUNTER — Encounter: Payer: Self-pay | Admitting: *Deleted

## 2013-05-09 ENCOUNTER — Other Ambulatory Visit: Payer: Self-pay | Admitting: Cardiology

## 2013-05-14 ENCOUNTER — Other Ambulatory Visit: Payer: Self-pay | Admitting: Cardiology

## 2013-05-23 ENCOUNTER — Ambulatory Visit (INDEPENDENT_AMBULATORY_CARE_PROVIDER_SITE_OTHER): Payer: Medicare Other | Admitting: *Deleted

## 2013-05-23 DIAGNOSIS — I059 Rheumatic mitral valve disease, unspecified: Secondary | ICD-10-CM

## 2013-05-23 DIAGNOSIS — I4891 Unspecified atrial fibrillation: Secondary | ICD-10-CM

## 2013-05-23 DIAGNOSIS — Z7901 Long term (current) use of anticoagulants: Secondary | ICD-10-CM

## 2013-05-23 DIAGNOSIS — Z5181 Encounter for therapeutic drug level monitoring: Secondary | ICD-10-CM

## 2013-05-23 LAB — POCT INR: INR: 2.7

## 2013-05-30 ENCOUNTER — Other Ambulatory Visit: Payer: Self-pay | Admitting: Internal Medicine

## 2013-06-03 ENCOUNTER — Other Ambulatory Visit: Payer: Self-pay | Admitting: Dermatology

## 2013-06-03 ENCOUNTER — Ambulatory Visit (HOSPITAL_COMMUNITY): Payer: Medicare Other | Attending: Internal Medicine | Admitting: Cardiology

## 2013-06-03 DIAGNOSIS — I658 Occlusion and stenosis of other precerebral arteries: Secondary | ICD-10-CM | POA: Insufficient documentation

## 2013-06-03 DIAGNOSIS — Z951 Presence of aortocoronary bypass graft: Secondary | ICD-10-CM | POA: Insufficient documentation

## 2013-06-03 DIAGNOSIS — I6529 Occlusion and stenosis of unspecified carotid artery: Secondary | ICD-10-CM | POA: Insufficient documentation

## 2013-06-03 DIAGNOSIS — I5022 Chronic systolic (congestive) heart failure: Secondary | ICD-10-CM

## 2013-06-03 DIAGNOSIS — E785 Hyperlipidemia, unspecified: Secondary | ICD-10-CM | POA: Insufficient documentation

## 2013-06-03 DIAGNOSIS — I2510 Atherosclerotic heart disease of native coronary artery without angina pectoris: Secondary | ICD-10-CM | POA: Insufficient documentation

## 2013-06-03 DIAGNOSIS — I10 Essential (primary) hypertension: Secondary | ICD-10-CM | POA: Insufficient documentation

## 2013-06-03 DIAGNOSIS — I4891 Unspecified atrial fibrillation: Secondary | ICD-10-CM

## 2013-06-03 DIAGNOSIS — Z87891 Personal history of nicotine dependence: Secondary | ICD-10-CM | POA: Insufficient documentation

## 2013-06-03 NOTE — Progress Notes (Signed)
Carotid duplex completed 

## 2013-06-07 ENCOUNTER — Other Ambulatory Visit: Payer: Self-pay | Admitting: Cardiology

## 2013-06-12 ENCOUNTER — Other Ambulatory Visit: Payer: Self-pay | Admitting: Cardiology

## 2013-06-12 ENCOUNTER — Encounter: Payer: Self-pay | Admitting: Internal Medicine

## 2013-06-12 LAB — MDC_IDC_ENUM_SESS_TYPE_REMOTE
Battery Remaining Longevity: 0 mo
Brady Statistic AP VP Percent: 52 %
Brady Statistic AP VS Percent: 1 %
Brady Statistic AS VS Percent: 1 %
Brady Statistic RA Percent Paced: 51 %
Date Time Interrogation Session: 20150510065236
HighPow Impedance: 41 Ohm
Lead Channel Impedance Value: 380 Ohm
Lead Channel Impedance Value: 510 Ohm
Lead Channel Pacing Threshold Amplitude: 1 V
Lead Channel Sensing Intrinsic Amplitude: 12 mV
Lead Channel Sensing Intrinsic Amplitude: 2.8 mV
Lead Channel Setting Pacing Amplitude: 2 V
Lead Channel Setting Pacing Amplitude: 2.5 V
Lead Channel Setting Pacing Amplitude: 3.75 V
Lead Channel Setting Pacing Pulse Width: 0.5 ms
MDC IDC MSMT BATTERY VOLTAGE: 2.59 V
MDC IDC MSMT LEADCHNL RA IMPEDANCE VALUE: 380 Ohm
MDC IDC MSMT LEADCHNL RA PACING THRESHOLD PULSEWIDTH: 0.5 ms
MDC IDC PG SERIAL: 726856
MDC IDC SET LEADCHNL LV PACING PULSEWIDTH: 1.5 ms
MDC IDC SET LEADCHNL RV SENSING SENSITIVITY: 0.5 mV
MDC IDC SET ZONE DETECTION INTERVAL: 250 ms
MDC IDC SET ZONE DETECTION INTERVAL: 280 ms
MDC IDC SET ZONE DETECTION INTERVAL: 350 ms
MDC IDC STAT BRADY AS VP PERCENT: 47 %

## 2013-06-13 ENCOUNTER — Ambulatory Visit (INDEPENDENT_AMBULATORY_CARE_PROVIDER_SITE_OTHER): Payer: Medicare Other | Admitting: *Deleted

## 2013-06-13 DIAGNOSIS — I509 Heart failure, unspecified: Secondary | ICD-10-CM

## 2013-06-13 DIAGNOSIS — I428 Other cardiomyopathies: Secondary | ICD-10-CM

## 2013-06-16 ENCOUNTER — Other Ambulatory Visit: Payer: Self-pay | Admitting: Cardiology

## 2013-06-17 NOTE — Progress Notes (Signed)
Remote ICD transmission.   

## 2013-06-28 ENCOUNTER — Ambulatory Visit (INDEPENDENT_AMBULATORY_CARE_PROVIDER_SITE_OTHER): Payer: Medicare Other | Admitting: Internal Medicine

## 2013-06-28 ENCOUNTER — Encounter: Payer: Self-pay | Admitting: Internal Medicine

## 2013-06-28 ENCOUNTER — Other Ambulatory Visit: Payer: Self-pay | Admitting: Cardiology

## 2013-06-28 ENCOUNTER — Encounter: Payer: Self-pay | Admitting: *Deleted

## 2013-06-28 VITALS — BP 122/60 | HR 63 | Ht 73.0 in | Wt 204.0 lb

## 2013-06-28 DIAGNOSIS — Z01812 Encounter for preprocedural laboratory examination: Secondary | ICD-10-CM

## 2013-06-28 DIAGNOSIS — I5022 Chronic systolic (congestive) heart failure: Secondary | ICD-10-CM

## 2013-06-28 DIAGNOSIS — I472 Ventricular tachycardia, unspecified: Secondary | ICD-10-CM

## 2013-06-28 DIAGNOSIS — I4891 Unspecified atrial fibrillation: Secondary | ICD-10-CM

## 2013-06-28 DIAGNOSIS — I4729 Other ventricular tachycardia: Secondary | ICD-10-CM

## 2013-06-28 DIAGNOSIS — I2589 Other forms of chronic ischemic heart disease: Secondary | ICD-10-CM

## 2013-06-28 DIAGNOSIS — Z95810 Presence of automatic (implantable) cardiac defibrillator: Secondary | ICD-10-CM

## 2013-06-28 DIAGNOSIS — I255 Ischemic cardiomyopathy: Secondary | ICD-10-CM

## 2013-06-28 DIAGNOSIS — I25810 Atherosclerosis of coronary artery bypass graft(s) without angina pectoris: Secondary | ICD-10-CM

## 2013-06-28 LAB — MDC_IDC_ENUM_SESS_TYPE_INCLINIC
Brady Statistic RA Percent Paced: 51 %
Brady Statistic RV Percent Paced: 99 %
HighPow Impedance: 43 Ohm
Lead Channel Impedance Value: 360 Ohm
Lead Channel Impedance Value: 360 Ohm
Lead Channel Impedance Value: 510 Ohm
Lead Channel Pacing Threshold Amplitude: 0.5 V
Lead Channel Pacing Threshold Amplitude: 1.125 V
Lead Channel Pacing Threshold Pulse Width: 0.5 ms
Lead Channel Sensing Intrinsic Amplitude: 2.6 mV
Lead Channel Setting Pacing Amplitude: 2 V
Lead Channel Setting Pacing Amplitude: 2.5 V
Lead Channel Setting Pacing Amplitude: 3.75 V
Lead Channel Setting Pacing Pulse Width: 0.5 ms
MDC IDC MSMT LEADCHNL LV PACING THRESHOLD AMPLITUDE: 2.75 V
MDC IDC MSMT LEADCHNL LV PACING THRESHOLD PULSEWIDTH: 1.5 ms
MDC IDC MSMT LEADCHNL RV PACING THRESHOLD PULSEWIDTH: 0.5 ms
MDC IDC PG SERIAL: 726856
MDC IDC SET LEADCHNL LV PACING PULSEWIDTH: 1.5 ms
MDC IDC SET LEADCHNL RV SENSING SENSITIVITY: 0.5 mV
MDC IDC SET ZONE DETECTION INTERVAL: 250 ms
Zone Setting Detection Interval: 280 ms
Zone Setting Detection Interval: 350 ms

## 2013-06-28 NOTE — Patient Instructions (Signed)
Your physician has recommended you make the following change in your medication:  1) STOP Aspirin  Your physician has recommended that you have a defibrillator battery change  You are scheduled for 07/15/13  Your physician recommends that you return for pre-procedure lab work on: 07/04/13  Your wound check is scheduled for: 07/25/13 at 2:30 pm

## 2013-06-28 NOTE — Progress Notes (Addendum)
Patient Care Team: Bruce W Burchette, MD as PCP - General   HPI  Sean [name] is a 80 y.o. male  followup for ventricular tachycardia in the setting of ischemic heart disease with prior CABG and a prior ICD implantation.   He underwent CRT upgrade in September 2010  He  developed ventricular tachycardia for which he was then ultimately submitted to catheter ablation by Dr. Taylor. May 2013. Targeted areas were   left ventricular septum. He was rendered noninducible.   Echo EF 2013  30%  Cath Final Conclusions 2013 :  1. Continued patency of the LIMA to the LAD diagonal  2. Continued patency of the stent OM2 graft  3. Known occlusion of the three remaining SVG  4. Known occlusion of the RCA   Functional status remains quite stable; he is limited at about 150 feet. He has not had chest pain. Efforts to increase his nitroglycerin from 60-90 were associated with lightheadedness. Sean [name] started to get into her husband twice daily.   He he has some mild edema. Past Medical History  Diagnosis Date  . HYPERLIPIDEMIA 04/07/2008  . HYPERTENSION 04/07/2008  . CAD, ARTERY BYPASS GRAFT 05/22/2008  . CARDIOMYOPATHY, ISCHEMIC 05/22/2008  . MITRAL VALVE PROLAPSE, NON-RHEUMATIC 05/22/2008  . AV BLOCK 05/22/2008  . Chronic systolic heart failure 10/13/2008  . CAROTID ARTERY STENOSIS 11/15/2009  . GERD 04/07/2008  . RENAL INSUFFICIENCY 10/17/2008  . DEGENERATIVE JOINT DISEASE, LEFT KNEE 04/07/2008  . COLONIC POLYPS, HX OF 04/07/2008  . DIVERTICULITIS, HX OF 04/07/2008  . Atrial fibrillation 04/04/2010  . Decreased left ventricular function   . Ventricular tachycardia     Status post catheter ablation may 2013  . CHF (congestive heart failure)     class 2 to 3    Past Surgical History  Procedure Laterality Date  . Cardiac surgery      open heart 1991, 1997, pacemaker insertion St Jude 2010 CD 3231  . Lung surgery  1987  . Hernia repair  1952    2004  . Cholecystectomy    . Appendectomy    .  Thoracotomy      secondary to lung mass  . Insert / replace / remove pacemaker      St Jude unify CD 3231/2010    Current Outpatient Prescriptions  Medication Sig Dispense Refill  . aspirin 81 MG tablet Take 1 tablet (81 mg total) by mouth every other day.      . carvedilol (COREG) 25 MG tablet Take 1 tablet (25 mg total) by mouth 2 (two) times daily.  180 tablet  3  . diphenhydramine-acetaminophen (TYLENOL PM) 25-500 MG TABS Take 1 tablet by mouth at bedtime.       . docusate sodium (COLACE) 100 MG capsule Take 100 mg by mouth daily as needed. For constipation      . fenofibrate 160 MG tablet TAKE 1 TABLET BY MOUTH EVERY DAY  60 tablet  3  . furosemide (LASIX) 40 MG tablet TAKE 2 TABLETS BY MOUTH EVERY MORNING AND 1 TABLET EVERY AFTERNOON  90 tablet  0  . isosorbide mononitrate (IMDUR) 30 MG 24 hr tablet Take 1 tablet (30 mg total) by mouth daily.  30 tablet  6  . losartan (COZAAR) 50 MG tablet TAKE 1 TABLET BY MOUTH EVERY DAY  30 tablet  3  . NITROSTAT 0.4 MG SL tablet PLACE 1 TABLET UNDER THE TONGUE EVERY 5 MINUTES AS NEEDED FOR CHEST PAIN  25 tablet  0  .   omeprazole (PRILOSEC) 20 MG capsule TAKE 1 CAPSULE BY MOUTH TWICE DAILY  180 capsule  2  . potassium chloride SA (K-DUR,KLOR-CON) 20 MEQ tablet TAKE 1 TABLET BY MOUTH TWICE DAILY  60 tablet  0  . simvastatin (ZOCOR) 40 MG tablet TAKE 1 TABLET BY MOUTH EVERY NIGHT AT BEDTIME  90 tablet  3  . Tamsulosin HCl (FLOMAX) 0.4 MG CAPS Take 0.4 mg by mouth daily.       . vitamin B-12 (CYANOCOBALAMIN) 500 MCG tablet Take 500 mcg by mouth daily.        . warfarin (COUMADIN) 5 MG tablet 1/2 tablet daily except 1 tablet on Mondays, Wednesdays, and Fridays or as directed by coumadin clinic  30 tablet  3  . [DISCONTINUED] triamcinolone (NASACORT AQ) 55 MCG/ACT nasal inhaler Place 2 sprays into the nose as needed.         No current facility-administered medications for this visit.    Allergies  Allergen Reactions  . Carisoprodol     REACTION:  rash  . Lisinopril     REACTION: cough  . Tape     Cloth Tape tears skin  . Zolpidem Tartrate Other (See Comments)    Altered mental status  . Penicillins Rash    Review of Systems negative except from HPI and PMH  Physical Exam BP 122/60  Pulse 63  Ht 6' 1" (1.854 m)  Wt 204 lb (92.534 kg)  BMI 26.92 kg/m2 Well developed and well nourished in no acute distress HENT normal E scleral and icterus clear Neck Supple JVP flat; carotids brisk and full Clear to ausculation  Regular rate and rhythm, 2/6  Murmur RUSB  Soft   No clubbing cyanosis none Edema Alert and oriented, grossly normal motor and sensory function Skin Warm and Dry  ECG demonstrates P. Synchronous pacing with a negative QRS in lead V1  Assessment and  Plan  Ischemic cardiomyopathy  Complete heart block  Sleep apnea  Congestive heart failure-chronic-systolic  CRT-D   The patient's device has reached ERI. There is on high LV threshold; I would not anticipate revision of the LV lead.  We have reviewed the benefits and risks of generator replacement.  These include but are not limited to lead fracture and infection.  The patient understands, agrees and is willing to proceed.     He was not tolerant of his CPAP. There is increasing fatigue and daytime somnolence. I wonder whether he may be a candidate for this new orthodontic contraption. I will have him see Dr. Young  

## 2013-07-04 ENCOUNTER — Ambulatory Visit (INDEPENDENT_AMBULATORY_CARE_PROVIDER_SITE_OTHER): Payer: Medicare Other | Admitting: *Deleted

## 2013-07-04 ENCOUNTER — Other Ambulatory Visit (INDEPENDENT_AMBULATORY_CARE_PROVIDER_SITE_OTHER): Payer: Medicare Other

## 2013-07-04 ENCOUNTER — Encounter (HOSPITAL_COMMUNITY): Payer: Self-pay

## 2013-07-04 DIAGNOSIS — I255 Ischemic cardiomyopathy: Secondary | ICD-10-CM

## 2013-07-04 DIAGNOSIS — I059 Rheumatic mitral valve disease, unspecified: Secondary | ICD-10-CM

## 2013-07-04 DIAGNOSIS — Z95810 Presence of automatic (implantable) cardiac defibrillator: Secondary | ICD-10-CM

## 2013-07-04 DIAGNOSIS — Z7901 Long term (current) use of anticoagulants: Secondary | ICD-10-CM

## 2013-07-04 DIAGNOSIS — I5022 Chronic systolic (congestive) heart failure: Secondary | ICD-10-CM

## 2013-07-04 DIAGNOSIS — Z01812 Encounter for preprocedural laboratory examination: Secondary | ICD-10-CM

## 2013-07-04 DIAGNOSIS — I2589 Other forms of chronic ischemic heart disease: Secondary | ICD-10-CM

## 2013-07-04 DIAGNOSIS — Z5181 Encounter for therapeutic drug level monitoring: Secondary | ICD-10-CM

## 2013-07-04 DIAGNOSIS — I4891 Unspecified atrial fibrillation: Secondary | ICD-10-CM

## 2013-07-04 DIAGNOSIS — E785 Hyperlipidemia, unspecified: Secondary | ICD-10-CM

## 2013-07-04 LAB — BASIC METABOLIC PANEL
BUN: 33 mg/dL — ABNORMAL HIGH (ref 6–23)
CO2: 29 mEq/L (ref 19–32)
CREATININE: 2.2 mg/dL — AB (ref 0.4–1.5)
Calcium: 9.5 mg/dL (ref 8.4–10.5)
Chloride: 102 mEq/L (ref 96–112)
GFR: 31.41 mL/min — AB (ref >60.00)
Glucose, Bld: 99 mg/dL (ref 70–99)
Potassium: 3.9 mEq/L (ref 3.5–5.1)
SODIUM: 139 meq/L (ref 135–145)

## 2013-07-04 LAB — CBC WITH DIFFERENTIAL/PLATELET
Basophils Absolute: 0 10*3/uL (ref 0.0–0.1)
Basophils Relative: 0.5 % (ref 0.0–3.0)
EOS ABS: 0.3 10*3/uL (ref 0.0–0.7)
Eosinophils Relative: 4.3 % (ref 0.0–5.0)
HCT: 38.2 % — ABNORMAL LOW (ref 39.0–52.0)
HEMOGLOBIN: 12.8 g/dL — AB (ref 13.0–17.0)
LYMPHS PCT: 26.1 % (ref 12.0–46.0)
Lymphs Abs: 1.6 10*3/uL (ref 0.7–4.0)
MCHC: 33.5 g/dL (ref 30.0–36.0)
MCV: 88.5 fl (ref 78.0–100.0)
MONOS PCT: 14.9 % — AB (ref 3.0–12.0)
Monocytes Absolute: 0.9 10*3/uL (ref 0.1–1.0)
NEUTROS ABS: 3.3 10*3/uL (ref 1.4–7.7)
NEUTROS PCT: 54.2 % (ref 43.0–77.0)
PLATELETS: 153 10*3/uL (ref 150.0–400.0)
RBC: 4.31 Mil/uL (ref 4.22–5.81)
RDW: 14.6 % (ref 11.5–15.5)
WBC: 6.2 10*3/uL (ref 4.0–10.5)

## 2013-07-04 LAB — PROTIME-INR
INR: 3.3 ratio — AB (ref 0.8–1.0)
PROTHROMBIN TIME: 35.1 s — AB (ref 9.6–13.1)

## 2013-07-04 LAB — POCT INR: INR: 3.2

## 2013-07-10 ENCOUNTER — Other Ambulatory Visit: Payer: Self-pay | Admitting: Cardiology

## 2013-07-11 ENCOUNTER — Ambulatory Visit (INDEPENDENT_AMBULATORY_CARE_PROVIDER_SITE_OTHER): Payer: Medicare Other

## 2013-07-11 ENCOUNTER — Other Ambulatory Visit: Payer: Self-pay | Admitting: Gastroenterology

## 2013-07-11 DIAGNOSIS — I4891 Unspecified atrial fibrillation: Secondary | ICD-10-CM

## 2013-07-11 DIAGNOSIS — I059 Rheumatic mitral valve disease, unspecified: Secondary | ICD-10-CM

## 2013-07-11 DIAGNOSIS — Z5181 Encounter for therapeutic drug level monitoring: Secondary | ICD-10-CM

## 2013-07-11 DIAGNOSIS — Z7901 Long term (current) use of anticoagulants: Secondary | ICD-10-CM

## 2013-07-11 LAB — POCT INR: INR: 2.8

## 2013-07-14 ENCOUNTER — Encounter: Payer: Medicare Other | Admitting: Internal Medicine

## 2013-07-14 MED ORDER — SODIUM CHLORIDE 0.9 % IR SOLN
80.0000 mg | Status: DC
  Filled 2013-07-14: qty 2

## 2013-07-14 MED ORDER — VANCOMYCIN HCL IN DEXTROSE 1-5 GM/200ML-% IV SOLN
1000.0000 mg | INTRAVENOUS | Status: DC
  Filled 2013-07-14: qty 200

## 2013-07-14 MED ORDER — VANCOMYCIN HCL IN DEXTROSE 1-5 GM/200ML-% IV SOLN
1000.0000 mg | INTRAVENOUS | Status: DC
Start: 2013-07-14 — End: 2013-07-15
  Filled 2013-07-14 (×2): qty 200

## 2013-07-15 ENCOUNTER — Encounter (HOSPITAL_COMMUNITY)
Admission: RE | Disposition: A | Discharge status: Home / Self Care | Payer: Medicare Other | Source: Ambulatory Visit | Attending: Internal Medicine

## 2013-07-15 ENCOUNTER — Ambulatory Visit (HOSPITAL_COMMUNITY)
Admission: RE | Admit: 2013-07-15 | Discharge: 2013-07-15 | Disposition: A | Discharge status: Home / Self Care | Payer: Medicare Other | Source: Ambulatory Visit | Attending: Internal Medicine | Admitting: Internal Medicine

## 2013-07-15 DIAGNOSIS — I5022 Chronic systolic (congestive) heart failure: Secondary | ICD-10-CM

## 2013-07-15 DIAGNOSIS — I472 Ventricular tachycardia, unspecified: Secondary | ICD-10-CM | POA: Insufficient documentation

## 2013-07-15 DIAGNOSIS — I4729 Other ventricular tachycardia: Secondary | ICD-10-CM | POA: Insufficient documentation

## 2013-07-15 DIAGNOSIS — Z01812 Encounter for preprocedural laboratory examination: Secondary | ICD-10-CM

## 2013-07-15 DIAGNOSIS — I442 Atrioventricular block, complete: Secondary | ICD-10-CM | POA: Insufficient documentation

## 2013-07-15 DIAGNOSIS — Z4502 Encounter for adjustment and management of automatic implantable cardiac defibrillator: Secondary | ICD-10-CM | POA: Insufficient documentation

## 2013-07-15 DIAGNOSIS — Z95810 Presence of automatic (implantable) cardiac defibrillator: Secondary | ICD-10-CM

## 2013-07-15 DIAGNOSIS — M1710 Unilateral primary osteoarthritis, unspecified knee: Secondary | ICD-10-CM | POA: Insufficient documentation

## 2013-07-15 DIAGNOSIS — N289 Disorder of kidney and ureter, unspecified: Secondary | ICD-10-CM | POA: Insufficient documentation

## 2013-07-15 DIAGNOSIS — Z9861 Coronary angioplasty status: Secondary | ICD-10-CM | POA: Insufficient documentation

## 2013-07-15 DIAGNOSIS — I255 Ischemic cardiomyopathy: Secondary | ICD-10-CM

## 2013-07-15 DIAGNOSIS — I4891 Unspecified atrial fibrillation: Secondary | ICD-10-CM | POA: Insufficient documentation

## 2013-07-15 DIAGNOSIS — K219 Gastro-esophageal reflux disease without esophagitis: Secondary | ICD-10-CM | POA: Insufficient documentation

## 2013-07-15 DIAGNOSIS — I059 Rheumatic mitral valve disease, unspecified: Secondary | ICD-10-CM | POA: Insufficient documentation

## 2013-07-15 DIAGNOSIS — I10 Essential (primary) hypertension: Secondary | ICD-10-CM | POA: Insufficient documentation

## 2013-07-15 DIAGNOSIS — Z951 Presence of aortocoronary bypass graft: Secondary | ICD-10-CM | POA: Insufficient documentation

## 2013-07-15 DIAGNOSIS — I2589 Other forms of chronic ischemic heart disease: Secondary | ICD-10-CM | POA: Insufficient documentation

## 2013-07-15 DIAGNOSIS — Z7901 Long term (current) use of anticoagulants: Secondary | ICD-10-CM | POA: Insufficient documentation

## 2013-07-15 DIAGNOSIS — Z7982 Long term (current) use of aspirin: Secondary | ICD-10-CM | POA: Insufficient documentation

## 2013-07-15 DIAGNOSIS — I509 Heart failure, unspecified: Secondary | ICD-10-CM | POA: Insufficient documentation

## 2013-07-15 DIAGNOSIS — G4730 Sleep apnea, unspecified: Secondary | ICD-10-CM | POA: Insufficient documentation

## 2013-07-15 DIAGNOSIS — E785 Hyperlipidemia, unspecified: Secondary | ICD-10-CM | POA: Insufficient documentation

## 2013-07-15 DIAGNOSIS — I6529 Occlusion and stenosis of unspecified carotid artery: Secondary | ICD-10-CM | POA: Insufficient documentation

## 2013-07-15 HISTORY — PX: IMPLANTABLE CARDIOVERTER DEFIBRILLATOR GENERATOR CHANGE: SHX5474

## 2013-07-15 LAB — SURGICAL PCR SCREEN
MRSA, PCR: NEGATIVE
STAPHYLOCOCCUS AUREUS: NEGATIVE

## 2013-07-15 LAB — PROTIME-INR
INR: 2.5 — ABNORMAL HIGH (ref 0.00–1.49)
Prothrombin Time: 26.2 seconds — ABNORMAL HIGH (ref 11.6–15.2)

## 2013-07-15 SURGERY — IMPLANTABLE CARDIOVERTER DEFIBRILLATOR GENERATOR CHANGE
Anesthesia: LOCAL

## 2013-07-15 MED ORDER — CHLORHEXIDINE GLUCONATE 4 % EX LIQD
60.0000 mL | Freq: Once | CUTANEOUS | Status: DC
  Filled 2013-07-15: qty 60

## 2013-07-15 MED ORDER — SODIUM CHLORIDE 0.9 % IV SOLN
INTRAVENOUS | Status: DC
  Administered 2013-07-15: 06:00:00 via INTRAVENOUS

## 2013-07-15 MED ORDER — ONDANSETRON HCL 4 MG/2ML IJ SOLN: 4.0000 mg | Freq: Four times a day (QID) | INTRAMUSCULAR | Status: DC | PRN

## 2013-07-15 MED ORDER — FENTANYL CITRATE 0.05 MG/ML IJ SOLN
INTRAMUSCULAR | Status: AC
  Filled 2013-07-15: qty 2

## 2013-07-15 MED ORDER — MIDAZOLAM HCL 5 MG/5ML IJ SOLN
INTRAMUSCULAR | Status: AC
  Filled 2013-07-15: qty 5

## 2013-07-15 MED ORDER — SODIUM CHLORIDE 0.9 % IV SOLN: INTRAVENOUS | Status: DC

## 2013-07-15 MED ORDER — MUPIROCIN 2 % EX OINT
TOPICAL_OINTMENT | Freq: Two times a day (BID) | CUTANEOUS | Status: DC
  Administered 2013-07-15: 1 via NASAL
  Filled 2013-07-15 (×2): qty 22

## 2013-07-15 MED ORDER — LIDOCAINE HCL (PF) 1 % IJ SOLN
INTRAMUSCULAR | Status: AC
  Filled 2013-07-15: qty 60

## 2013-07-15 MED ORDER — ACETAMINOPHEN 325 MG PO TABS
325.0000 mg | ORAL_TABLET | ORAL | Status: DC | PRN
  Filled 2013-07-15: qty 2

## 2013-07-15 NOTE — H&P (View-Only) (Signed)
Patient Care Team: Eulas Post, MD as PCP - General   HPI  Sean [name] is a 80 y.o. male  followup for ventricular tachycardia in the setting of ischemic heart disease with prior CABG and a prior ICD implantation.   He underwent CRT upgrade in September 2010  He  developed ventricular tachycardia for which he was then ultimately submitted to catheter ablation by Dr. Lovena Le. May 2013. Targeted areas were   left ventricular septum. He was rendered noninducible.   Echo EF 2013  30%  Cath Final Conclusions 2013 :  1. Continued patency of the LIMA to the LAD diagonal  2. Continued patency of the stent OM2 graft  3. Known occlusion of the three remaining SVG  4. Known occlusion of the RCA   Functional status remains quite stable; he is limited at about 150 feet. He has not had chest pain. Efforts to increase his nitroglycerin from 60-90 were associated with lightheadedness. She started to get into her husband twice daily.   He he has some mild edema. Past Medical History  Diagnosis Date  . HYPERLIPIDEMIA 04/07/2008  . HYPERTENSION 04/07/2008  . CAD, ARTERY BYPASS GRAFT 05/22/2008  . CARDIOMYOPATHY, ISCHEMIC 05/22/2008  . MITRAL VALVE PROLAPSE, NON-RHEUMATIC 05/22/2008  . AV BLOCK 05/22/2008  . Chronic systolic heart failure 3/41/9379  . CAROTID ARTERY STENOSIS 11/15/2009  . GERD 04/07/2008  . RENAL INSUFFICIENCY 10/17/2008  . DEGENERATIVE JOINT DISEASE, LEFT KNEE 04/07/2008  . COLONIC POLYPS, HX OF 04/07/2008  . DIVERTICULITIS, HX OF 04/07/2008  . Atrial fibrillation 04/04/2010  . Decreased left ventricular function   . Ventricular tachycardia     Status post catheter ablation may 2013  . CHF (congestive heart failure)     class 2 to 3    Past Surgical History  Procedure Laterality Date  . Cardiac surgery      open heart 1991, 1997, pacemaker insertion [place] 2010 CD 3231  . Lung surgery  1987  . Hernia repair  1952    2004  . Cholecystectomy    . Appendectomy    .  Thoracotomy      secondary to lung mass  . Insert / replace / remove pacemaker      St Jude unify CD 3231/2010    Current Outpatient Prescriptions  Medication Sig Dispense Refill  . aspirin 81 MG tablet Take 1 tablet (81 mg total) by mouth every other day.      . carvedilol (COREG) 25 MG tablet Take 1 tablet (25 mg total) by mouth 2 (two) times daily.  180 tablet  3  . diphenhydramine-acetaminophen (TYLENOL PM) 25-500 MG TABS Take 1 tablet by mouth at bedtime.       . docusate sodium (COLACE) 100 MG capsule Take 100 mg by mouth daily as needed. For constipation      . fenofibrate 160 MG tablet TAKE 1 TABLET BY MOUTH EVERY DAY  60 tablet  3  . furosemide (LASIX) 40 MG tablet TAKE 2 TABLETS BY MOUTH EVERY MORNING AND 1 TABLET EVERY AFTERNOON  90 tablet  0  . isosorbide mononitrate (IMDUR) 30 MG 24 hr tablet Take 1 tablet (30 mg total) by mouth daily.  30 tablet  6  . losartan (COZAAR) 50 MG tablet TAKE 1 TABLET BY MOUTH EVERY DAY  30 tablet  3  . NITROSTAT 0.4 MG SL tablet PLACE 1 TABLET UNDER THE TONGUE EVERY 5 MINUTES AS NEEDED FOR CHEST PAIN  25 tablet  0  .  omeprazole (PRILOSEC) 20 MG capsule TAKE 1 CAPSULE BY MOUTH TWICE DAILY  180 capsule  2  . potassium chloride SA (K-DUR,KLOR-CON) 20 MEQ tablet TAKE 1 TABLET BY MOUTH TWICE DAILY  60 tablet  0  . simvastatin (ZOCOR) 40 MG tablet TAKE 1 TABLET BY MOUTH EVERY NIGHT AT BEDTIME  90 tablet  3  . Tamsulosin HCl (FLOMAX) 0.4 MG CAPS Take 0.4 mg by mouth daily.       . vitamin B-12 (CYANOCOBALAMIN) 500 MCG tablet Take 500 mcg by mouth daily.        Marland Kitchen warfarin (COUMADIN) 5 MG tablet 1/2 tablet daily except 1 tablet on Mondays, Wednesdays, and Fridays or as directed by coumadin clinic  30 tablet  3  . [DISCONTINUED] triamcinolone (NASACORT AQ) 55 MCG/ACT nasal inhaler Place 2 sprays into the nose as needed.         No current facility-administered medications for this visit.    Allergies  Allergen Reactions  . Carisoprodol     REACTION:  rash  . Lisinopril     REACTION: cough  . Tape     Cloth Tape tears skin  . Zolpidem Tartrate Other (See Comments)    Altered mental status  . Penicillins Rash    Review of Systems negative except from HPI and PMH  Physical Exam BP 122/60  Pulse 63  Ht 6\' 1"  (1.854 m)  Wt 204 lb (92.534 kg)  BMI 26.92 kg/m2 Well developed and well nourished in no acute distress HENT normal E scleral and icterus clear Neck Supple JVP flat; carotids brisk and full Clear to ausculation  Regular rate and rhythm, 2/6  Murmur RUSB  Soft   No clubbing cyanosis none Edema Alert and oriented, grossly normal motor and sensory function Skin Warm and Dry  ECG demonstrates P. Synchronous pacing with a negative QRS in lead V1  Assessment and  Plan  Ischemic cardiomyopathy  Complete heart block  Sleep apnea  Congestive heart failure-chronic-systolic  CRT-D   The patient's device has reached ERI. There is on high LV threshold; I would not anticipate revision of the LV lead.  We have reviewed the benefits and risks of generator replacement.  These include but are not limited to lead fracture and infection.  The patient understands, agrees and is willing to proceed.     He was not tolerant of his CPAP. There is increasing fatigue and daytime somnolence. I wonder whether he may be a candidate for this new orthodontic contraption. I will have him see Dr. Annamaria Boots

## 2013-07-15 NOTE — CV Procedure (Signed)
Preoperative diagnosis CHB ischemic Cardiomyopathy CHF  CRT-D at [facility] Postoperative diagnosis same/   Procedure: Generator replacement     Following informed consent the patient was brought to the electrophysiology laboratory in place of the fluoroscopic table in the supine position after routine prep and drape lidocaine was infiltrated in the region of the previous incision and carried down to later the device pocket using sharp dissection and electrocautery. The pocket was opened the device was freed up and was explanted.  Interrogation of the previously implanted ICD ventricular lead [employer] done through the device   demonstrated an R wave of NONE  millivolts., and impedance of 490 ohms, and a pacing threshold of 0.5 volts at 0.5 msec.     Interrogation of the previously implanted Left ventricular lead St Jude   demonstrated an R wave of NONE  millivolts., and impedance of 610 ohms, and a pacing threshold of 2.0 volts at 0.1.2 msec, threshold testing done through the device  The previously implanted atrial lead St Jude  demonstrated a P-wave amplitude of 2.6 milllivolts  and impedance of  395 ohms, and a pacing threshold of 1.3 volts at  @ 0.52milliseconds.  The leads were inspected. Repair was not  needed. The leads were then attached to a St Jude  pulse generator, serial number [id number].    Through the device the P-wave amplitude  Was  3.2 milllivolts and impedance of  410 ohms, and a pacing threshold of 1.0 volts at @ 0.78milliseconds;  High voltage impedances were 44 ohms  The pocket was irrigated with antibiotic containing saline solution hemostasis was assured and the leads and the device were placed in the pocket. The wound was then closed in 3 layers in normal fashion.  The patient tolerated the procedure without apparent complication.  DFT testing was not performed  Virl Axe   \

## 2013-07-15 NOTE — Interval H&P Note (Signed)
ICD Criteria  Current LVEF:30% ;Obtained > 6 months ago.   NYHA Functional Classification: Class II  Heart Failure History:  Yes, Duration of heart failure since onset is > 9 months  Non-Ischemic Dilated Cardiomyopathy History:  No.  Atrial Fibrillation/Atrial Flutter:  No.  Ventricular Tachycardia History:  Yes, Hemodynamic instability present, VT Type:  SVT - Monomorphic.  Cardiac Arrest History:  No  History of Syndromes with Risk of Sudden Death:  No.  Previous ICD:  Yes, ICD Type:  CRT-D, Reason for ICD:  Primary prevention.  LVEF is not available  Electrophysiology Study: No.  Prior MI: Yes, Most recent MI timeframe is > 40 days.  PPM: No.  OSA:  No  Patient Life Expectancy of >=1 year: Yes.  Anticoagulation Therapy:  Patient is on anticoagulation therapy, anticoagulation was held prior to procedure. But INR 2.5  Beta Blocker Therapy:  Yes.   Ace Inhibitor/ARB Therapy:  Yes.History and Physical Interval Note:  07/15/2013 6:56 AM  [name]  has presented today for surgery, with the diagnosis of eri/eol  The various methods of treatment have been discussed with the patient and family. After consideration of risks, benefits and other options for treatment, the patient has consented to  Procedure(s): IMPLANTABLE CARDIOVERTER DEFIBRILLATOR GENERATOR CHANGE (N/A) as a surgical intervention .  The patient's history has been reviewed, patient examined, no change in status, stable for surgery.  I have reviewed the patient's chart and labs.  Questions were answered to the patient's satisfaction.     Sean [name]

## 2013-07-15 NOTE — Discharge Instructions (Signed)
Pacemaker Battery Change, Care After  Refer to this sheet in the next few weeks. These instructions provide you with information on caring for yourself after your procedure. Your health care provider may also give you more specific instructions. Your treatment has been planned according to current medical practices, but problems sometimes occur. Call your health care provider if you have any problems or questions after your procedure. WHAT TO EXPECT AFTER THE PROCEDURE After your procedure, it is typical to have the following sensations:  Soreness at the pacemaker site. HOME CARE INSTRUCTIONS   Keep dressing on until tomorrow morning.  Unless advised otherwise, you may shower tomorrow evening.  Do not drive for 4 days.  For the first week after the replacement, avoid stretching motions that pull at the incision site and avoid heavy exercise with the arm on the same side as the incision.  Only take over-the-counter or prescription medicines for pain, discomfort, or fever as directed by your health care provider.  Your health care provider will tell you when you will need to next test your pacemaker by telephone or when to return to the office for follow up for removal of stitches. SEEK MEDICAL CARE IF:   You have pain at the incision site that is not relieved by over-the-counter or prescription medicine.  There is drainage or pus from the incision site.  There is swelling larger than a lime at the incision site.  You develop red streaking that extends above or below the incision site.  You feel brief, intermittent palpitations, lightheadedness, or any symptoms that you feel might be related to your heart. SEEK IMMEDIATE MEDICAL CARE IF:   You experience chest pain that is different than the pain at the pacemaker site.  Shortness of breath.  Palpitations or irregular heart beat.  Lightheadedness that does not go away quickly.  Fainting.  You have pain that gets worse and is  not relieved by medicine. MAKE SURE YOU:   Understand these instructions.  Will watch your condition.  Will get help right away if you are not doing well or get worse. Document Released: 11/10/2012 Document Reviewed: 08/04/2012 [facility] Patient Information 2014 [place], [place].

## 2013-07-25 ENCOUNTER — Encounter: Payer: Self-pay | Admitting: *Deleted

## 2013-07-25 ENCOUNTER — Ambulatory Visit (INDEPENDENT_AMBULATORY_CARE_PROVIDER_SITE_OTHER): Payer: Medicare Other | Admitting: *Deleted

## 2013-07-25 DIAGNOSIS — I059 Rheumatic mitral valve disease, unspecified: Secondary | ICD-10-CM

## 2013-07-25 DIAGNOSIS — Z7901 Long term (current) use of anticoagulants: Secondary | ICD-10-CM

## 2013-07-25 DIAGNOSIS — I2589 Other forms of chronic ischemic heart disease: Secondary | ICD-10-CM

## 2013-07-25 DIAGNOSIS — I4891 Unspecified atrial fibrillation: Secondary | ICD-10-CM

## 2013-07-25 DIAGNOSIS — I5022 Chronic systolic (congestive) heart failure: Secondary | ICD-10-CM

## 2013-07-25 DIAGNOSIS — Z5181 Encounter for therapeutic drug level monitoring: Secondary | ICD-10-CM

## 2013-07-25 DIAGNOSIS — I472 Ventricular tachycardia, unspecified: Secondary | ICD-10-CM

## 2013-07-25 DIAGNOSIS — I4729 Other ventricular tachycardia: Secondary | ICD-10-CM

## 2013-07-25 LAB — MDC_IDC_ENUM_SESS_TYPE_INCLINIC
Brady Statistic RA Percent Paced: 63 %
Date Time Interrogation Session: 20150622153954
HighPow Impedance: 40.1538
Lead Channel Impedance Value: 312.5 Ohm
Lead Channel Pacing Threshold Amplitude: 0.5 V
Lead Channel Pacing Threshold Amplitude: 1 V
Lead Channel Pacing Threshold Amplitude: 1 V
Lead Channel Pacing Threshold Amplitude: 1.5 V
Lead Channel Pacing Threshold Pulse Width: 0.5 ms
Lead Channel Pacing Threshold Pulse Width: 1.2 ms
Lead Channel Pacing Threshold Pulse Width: 1.2 ms
Lead Channel Sensing Intrinsic Amplitude: 2.1 mV
Lead Channel Setting Pacing Amplitude: 2.5 V
Lead Channel Setting Pacing Amplitude: 2.5 V
Lead Channel Setting Pacing Pulse Width: 0.5 ms
MDC IDC MSMT BATTERY REMAINING LONGEVITY: 63.6 mo
MDC IDC MSMT LEADCHNL LV PACING THRESHOLD AMPLITUDE: 1.5 V
MDC IDC MSMT LEADCHNL RA IMPEDANCE VALUE: 400 Ohm
MDC IDC MSMT LEADCHNL RA PACING THRESHOLD PULSEWIDTH: 0.5 ms
MDC IDC MSMT LEADCHNL RV IMPEDANCE VALUE: 450 Ohm
MDC IDC MSMT LEADCHNL RV PACING THRESHOLD AMPLITUDE: 0.5 V
MDC IDC MSMT LEADCHNL RV PACING THRESHOLD PULSEWIDTH: 0.5 ms
MDC IDC MSMT LEADCHNL RV PACING THRESHOLD PULSEWIDTH: 0.5 ms
MDC IDC MSMT LEADCHNL RV SENSING INTR AMPL: 11.5 mV
MDC IDC PG SERIAL: 7183458
MDC IDC SET LEADCHNL LV PACING PULSEWIDTH: 1.2 ms
MDC IDC SET LEADCHNL RA PACING AMPLITUDE: 2 V
MDC IDC SET LEADCHNL RV SENSING SENSITIVITY: 2 mV
MDC IDC SET ZONE DETECTION INTERVAL: 250 ms
MDC IDC SET ZONE DETECTION INTERVAL: 350 ms
MDC IDC STAT BRADY RV PERCENT PACED: 99.09 %
Zone Setting Detection Interval: 280 ms

## 2013-07-25 LAB — POCT INR: INR: 3.5

## 2013-07-25 NOTE — Progress Notes (Addendum)
Wound check appointment. Wound without redness or edema. Incision edges approximated, wound well healed. Normal device function. Thresholds, sensing, and impedances consistent with implant measurements. Device programmed at appropriate safety margins. Histogram distribution appropriate for patient and level of activity. No mode switches or ventricular arrhythmias noted. Patient educated about wound care, arm mobility, lifting restrictions, shock plan. ROV in 3 months with SK. 

## 2013-08-09 ENCOUNTER — Encounter: Payer: Self-pay | Admitting: Family Medicine

## 2013-08-09 ENCOUNTER — Ambulatory Visit (INDEPENDENT_AMBULATORY_CARE_PROVIDER_SITE_OTHER): Payer: Medicare Other | Admitting: Family Medicine

## 2013-08-09 VITALS — BP 120/68 | HR 60 | Temp 97.7°F | Wt 202.0 lb

## 2013-08-09 DIAGNOSIS — R21 Rash and other nonspecific skin eruption: Secondary | ICD-10-CM

## 2013-08-09 DIAGNOSIS — I25810 Atherosclerosis of coronary artery bypass graft(s) without angina pectoris: Secondary | ICD-10-CM

## 2013-08-09 MED ORDER — TRIAMCINOLONE ACETONIDE 0.1 % EX CREA: 1.0000 "application " | TOPICAL_CREAM | Freq: Two times a day (BID) | CUTANEOUS | Status: DC

## 2013-08-09 NOTE — Progress Notes (Signed)
Pre visit review using our clinic review tool, if applicable. No additional management support is needed unless otherwise documented below in the visit note. 

## 2013-08-09 NOTE — Progress Notes (Signed)
   Subjective:    Patient ID: Sean [name], male    DOB: January 30, 1932, 80 y.o.   MRN: [id number]  Rash Pertinent negatives include no fever.   Evaluation for skin rash. Pruritic rash left thoracic area. Onset about 5-6 days ago. Itching and nonpainful. Has tried calamine which does relieve the itch. Exacerbated by heat. Denies any other rashes. No history of similar rash.  Past Medical History  Diagnosis Date  . HYPERLIPIDEMIA 04/07/2008  . HYPERTENSION 04/07/2008  . CAD, ARTERY BYPASS GRAFT 05/22/2008  . CARDIOMYOPATHY, ISCHEMIC 05/22/2008  . MITRAL VALVE PROLAPSE, NON-RHEUMATIC 05/22/2008  . AV BLOCK 05/22/2008  . Chronic systolic heart failure 3/76/2831  . CAROTID ARTERY STENOSIS 11/15/2009  . GERD 04/07/2008  . RENAL INSUFFICIENCY 10/17/2008  . DEGENERATIVE JOINT DISEASE, LEFT KNEE 04/07/2008  . COLONIC POLYPS, HX OF 04/07/2008  . DIVERTICULITIS, HX OF 04/07/2008  . Atrial fibrillation 04/04/2010  . Decreased left ventricular function   . Ventricular tachycardia     Status post catheter ablation may 2013  . CHF (congestive heart failure)     class 2 to 3   Past Surgical History  Procedure Laterality Date  . Cardiac surgery      open heart 1991, 1997, pacemaker insertion [place] 2010 CD 3231  . Lung surgery  1987  . Hernia repair  1952    2004  . Cholecystectomy    . Appendectomy    . Thoracotomy      secondary to lung mass  . Insert / replace / remove pacemaker      St Jude unify CD 3231/2010    reports that he quit smoking about 41 years ago. His smoking use included Cigarettes. He has a 15 pack-year smoking history. He has never used smokeless tobacco. He reports that he does not drink alcohol or use illicit drugs. family history includes Coronary artery disease in his mother; Heart disease in his brother, mother, and sister. Allergies  Allergen Reactions  . Carisoprodol     REACTION: rash  . Lisinopril     REACTION: cough  . Tape     Cloth Tape tears skin  . Zolpidem  Tartrate Other (See Comments)    Altered mental status  . Penicillins Rash      Review of Systems  Constitutional: Negative for fever and chills.  Skin: Positive for rash.       Objective:   Physical Exam  Constitutional: He appears well-developed and well-nourished.  Cardiovascular: Normal rate and regular rhythm.   Pulmonary/Chest: Effort normal and breath sounds normal. No respiratory distress. He has no wheezes. He has no rales.  Skin: Rash noted.  Left thoracic area reveals 8 x 6 cm area of rash which is erythematous and slightly raised and blanches with pressure. No pustules. No vesicles. Nontender.          Assessment & Plan:  Skin rash. This is not typical for shingles though distribution did raise our suspicion. He does not have any associated pain or vesicles to suggest likely shingles though this does conform to somewhat of a dermatomal type distribution. Suspect this is contact allergy. Avoid systemic steroids if possible with his chronic cardiac issues. Topical triamcinolone 0.1% cream twice a day as needed

## 2013-08-12 ENCOUNTER — Ambulatory Visit (INDEPENDENT_AMBULATORY_CARE_PROVIDER_SITE_OTHER): Payer: Medicare Other

## 2013-08-12 DIAGNOSIS — Z7901 Long term (current) use of anticoagulants: Secondary | ICD-10-CM

## 2013-08-12 DIAGNOSIS — Z5181 Encounter for therapeutic drug level monitoring: Secondary | ICD-10-CM

## 2013-08-12 DIAGNOSIS — I4891 Unspecified atrial fibrillation: Secondary | ICD-10-CM

## 2013-08-12 DIAGNOSIS — I059 Rheumatic mitral valve disease, unspecified: Secondary | ICD-10-CM

## 2013-08-12 LAB — POCT INR: INR: 3.7

## 2013-08-15 ENCOUNTER — Telehealth: Payer: Self-pay | Admitting: *Deleted

## 2013-08-15 NOTE — Telephone Encounter (Signed)
Received call earlier that Sean [name] is scheduled for colonoscopy on July 24th.This nurse called and spoke with Sean [name] at [facility] and she states they will send cardiac clearance to Dr Aundra Dubin. Spoke with his nurse Desiree Lucy and she states they do have the clearance and Dr Aundra Dubin with be in office tomorrow and will address at that time Pt called and given above information

## 2013-08-17 ENCOUNTER — Encounter: Payer: Self-pay | Admitting: Internal Medicine

## 2013-08-18 ENCOUNTER — Telehealth: Payer: Self-pay | Admitting: *Deleted

## 2013-08-18 NOTE — Telephone Encounter (Signed)
Spoke with pt as have received Cardiac Clearance from Dr Aundra Dubin and instructed that last day to take his coumadin is July 18th and is to hold coumadin from July 19th to day of procedure on July 24th and instructed to ask Md doing procedure when to restart coumadin and when restart coumadin start at same dose except take extra 1/2 tablet for 2 days and made appt for return visit to clinic July 31st and pt states understanding Faxed cardiac clearance to [facility] at [facility]

## 2013-08-23 ENCOUNTER — Other Ambulatory Visit: Payer: Self-pay | Admitting: Cardiology

## 2013-08-26 ENCOUNTER — Encounter (HOSPITAL_COMMUNITY)
Admission: RE | Disposition: A | Discharge status: Home / Self Care | Payer: Self-pay | Source: Ambulatory Visit | Attending: Gastroenterology

## 2013-08-26 ENCOUNTER — Encounter (HOSPITAL_COMMUNITY): Payer: Self-pay | Admitting: *Deleted

## 2013-08-26 ENCOUNTER — Ambulatory Visit (HOSPITAL_COMMUNITY)
Admission: RE | Admit: 2013-08-26 | Discharge: 2013-08-26 | Disposition: A | Discharge status: Home / Self Care | Payer: Medicare Other | Source: Ambulatory Visit | Attending: Gastroenterology | Admitting: Gastroenterology

## 2013-08-26 DIAGNOSIS — I10 Essential (primary) hypertension: Secondary | ICD-10-CM | POA: Diagnosis not present

## 2013-08-26 DIAGNOSIS — K219 Gastro-esophageal reflux disease without esophagitis: Secondary | ICD-10-CM | POA: Insufficient documentation

## 2013-08-26 DIAGNOSIS — I2510 Atherosclerotic heart disease of native coronary artery without angina pectoris: Secondary | ICD-10-CM | POA: Diagnosis not present

## 2013-08-26 DIAGNOSIS — I428 Other cardiomyopathies: Secondary | ICD-10-CM | POA: Insufficient documentation

## 2013-08-26 DIAGNOSIS — Z951 Presence of aortocoronary bypass graft: Secondary | ICD-10-CM | POA: Insufficient documentation

## 2013-08-26 DIAGNOSIS — K5730 Diverticulosis of large intestine without perforation or abscess without bleeding: Secondary | ICD-10-CM | POA: Diagnosis not present

## 2013-08-26 DIAGNOSIS — Z880 Allergy status to penicillin: Secondary | ICD-10-CM | POA: Insufficient documentation

## 2013-08-26 DIAGNOSIS — Z9089 Acquired absence of other organs: Secondary | ICD-10-CM | POA: Diagnosis not present

## 2013-08-26 DIAGNOSIS — E785 Hyperlipidemia, unspecified: Secondary | ICD-10-CM | POA: Diagnosis not present

## 2013-08-26 DIAGNOSIS — Z950 Presence of cardiac pacemaker: Secondary | ICD-10-CM | POA: Diagnosis not present

## 2013-08-26 DIAGNOSIS — I6529 Occlusion and stenosis of unspecified carotid artery: Secondary | ICD-10-CM | POA: Diagnosis not present

## 2013-08-26 DIAGNOSIS — D126 Benign neoplasm of colon, unspecified: Secondary | ICD-10-CM | POA: Insufficient documentation

## 2013-08-26 DIAGNOSIS — Z87891 Personal history of nicotine dependence: Secondary | ICD-10-CM | POA: Insufficient documentation

## 2013-08-26 DIAGNOSIS — Z09 Encounter for follow-up examination after completed treatment for conditions other than malignant neoplasm: Secondary | ICD-10-CM | POA: Diagnosis present

## 2013-08-26 HISTORY — PX: COLONOSCOPY: SHX5424

## 2013-08-26 SURGERY — COLONOSCOPY
Anesthesia: Moderate Sedation

## 2013-08-26 MED ORDER — FENTANYL CITRATE 0.05 MG/ML IJ SOLN
INTRAMUSCULAR | Status: AC
  Filled 2013-08-26: qty 2

## 2013-08-26 MED ORDER — FENTANYL CITRATE 0.05 MG/ML IJ SOLN
INTRAMUSCULAR | Status: DC | PRN
  Administered 2013-08-26: 25 ug via INTRAVENOUS
  Administered 2013-08-26: 10 ug via INTRAVENOUS
  Administered 2013-08-26: 25 ug via INTRAVENOUS

## 2013-08-26 MED ORDER — MIDAZOLAM HCL 5 MG/5ML IJ SOLN
INTRAMUSCULAR | Status: DC | PRN
  Administered 2013-08-26 (×3): 1 mg via INTRAVENOUS

## 2013-08-26 MED ORDER — MIDAZOLAM HCL 10 MG/2ML IJ SOLN
INTRAMUSCULAR | Status: AC
  Filled 2013-08-26: qty 2

## 2013-08-26 MED ORDER — SODIUM CHLORIDE 0.9 % IV SOLN
INTRAVENOUS | Status: DC
  Administered 2013-08-26: 500 mL via INTRAVENOUS

## 2013-08-26 NOTE — H&P (Signed)
  Sacha E Raborn HPI: This is an 80 year old male with a personal history of adenomas.  His last colonoscopy in 2012 was significant for 5 adenomas.  No GI issues during the interval time period.  Past Medical History  Diagnosis Date  . HYPERLIPIDEMIA 04/07/2008  . HYPERTENSION 04/07/2008  . CAD, ARTERY BYPASS GRAFT 05/22/2008  . CARDIOMYOPATHY, ISCHEMIC 05/22/2008  . MITRAL VALVE PROLAPSE, NON-RHEUMATIC 05/22/2008  . AV BLOCK 05/22/2008  . Chronic systolic heart failure 4/31/5400  . CAROTID ARTERY STENOSIS 11/15/2009  . GERD 04/07/2008  . RENAL INSUFFICIENCY 10/17/2008  . DEGENERATIVE JOINT DISEASE, LEFT KNEE 04/07/2008  . COLONIC POLYPS, HX OF 04/07/2008  . DIVERTICULITIS, HX OF 04/07/2008  . Atrial fibrillation 04/04/2010  . Decreased left ventricular function   . Ventricular tachycardia     Status post catheter ablation may 2013  . CHF (congestive heart failure)     class 2 to 3    Past Surgical History  Procedure Laterality Date  . Cardiac surgery      open heart 1991, 1997, pacemaker insertion [place] 2010 CD 3231  . Lung surgery  1987  . Hernia repair  1952    2004  . Cholecystectomy    . Appendectomy    . Thoracotomy      secondary to lung mass  . Insert / replace / remove pacemaker      St Jude unify CD 3231/2010    Family History  Problem Relation Age of Onset  . Heart disease Mother     CAD, deceased   . Coronary artery disease Mother   . Heart disease Sister   . Heart disease Brother     Social History:  reports that he quit smoking about 41 years ago. His smoking use included Cigarettes. He has a 15 pack-year smoking history. He has never used smokeless tobacco. He reports that he does not drink alcohol or use illicit drugs.  Allergies:  Allergies  Allergen Reactions  . Carisoprodol     REACTION: rash  . Lisinopril     REACTION: cough  . Tape     Cloth Tape tears skin  . Zolpidem Tartrate Other (See Comments)    Altered mental status  . Penicillins Rash     Medications:  Scheduled:  Continuous: . sodium chloride 500 mL (08/26/13 0849)    No results found for this or any previous visit (from the past 24 hour(s)).   No results found.  ROS:  As stated above in the HPI otherwise negative.  Blood pressure 135/55, temperature 97.9 F (36.6 C), temperature source Oral, resp. rate 15, height 6\' 1"  (1.854 m), weight 194 lb (87.998 kg), SpO2 97.00%.    PE: Gen: NAD, Alert and Oriented HEENT:  [place]/AT, EOMI Neck: Supple, no LAD Lungs: CTA Bilaterally CV: RRR without M/G/R ABM: Soft, NTND, +BS Ext: No C/C/E  Assessment/Plan: 1) Personal history of polyps.  Plan: 1) Colonoscopy today.  Shariyah Eland D 08/26/2013, 9:43 AM

## 2013-08-26 NOTE — Op Note (Signed)
[facility] [place], [address]   OPERATIVE PROCEDURE REPORT  PATIENT: Sean [name], Sean [name]  MR#: [id number] BIRTHDATE: 17-Apr-1931  GENDER: Male ENDOSCOPIST: Carol Ada, MD ASSISTANT:   Tedra Coupe, Cleda Daub, RN CGRN PROCEDURE DATE: 08/26/2013 PROCEDURE:   Colonoscopy with snare polypectomy ASA CLASS:   Class III INDICATIONS: Personal history of polyps MEDICATIONS: Versed 3 mg IV and Fentanyl 60 mcg IV  DESCRIPTION OF PROCEDURE:   After the risks benefits and alternatives of the procedure were thoroughly explained, informed consent was obtained.  A digital rectal exam revealed no abnormalities of the rectum.    The Pentax Ped Colon [id number] endoscope was introduced through the anus  and advanced to the cecum, which was identified by both the appendix and ileocecal valve , No adverse events experienced.    The quality of the prep was good. .  The instrument was then slowly withdrawn as the colon was fully examined.     FINDINGS: In the transverse colon a 3 mm sessile polyp was removed with a cold snare.  Scattered left sided diverticula were found. No evidence of any inflammation, ulcerations, erosions, or vascular abnormalities.          The scope was then withdrawn from the patient and the procedure terminated.  COMPLICATIONS: There were no complications.  IMPRESSION: 1) Colonic polyp. 2) Diverticula.  RECOMMENDATIONS: 1) Await biopsy results. 2) Given the patient's age, cardiac comorbidities, and current findings, I do not feel a repeat colonoscopy is necessary. However, I will have him follow up in the office in 5 years to determine his clinical statues.  _______________________________ eSignedCarol Ada, MD 08/26/2013 10:25 AM

## 2013-08-29 ENCOUNTER — Encounter (HOSPITAL_COMMUNITY): Payer: Self-pay | Admitting: Gastroenterology

## 2013-09-02 ENCOUNTER — Ambulatory Visit (INDEPENDENT_AMBULATORY_CARE_PROVIDER_SITE_OTHER): Payer: Medicare Other | Admitting: Cardiology

## 2013-09-02 ENCOUNTER — Encounter: Payer: Self-pay | Admitting: Cardiology

## 2013-09-02 ENCOUNTER — Ambulatory Visit (INDEPENDENT_AMBULATORY_CARE_PROVIDER_SITE_OTHER): Payer: Medicare Other | Admitting: Pharmacist

## 2013-09-02 VITALS — BP 110/48 | HR 62 | Ht 73.0 in | Wt 201.0 lb

## 2013-09-02 DIAGNOSIS — I340 Nonrheumatic mitral (valve) insufficiency: Secondary | ICD-10-CM

## 2013-09-02 DIAGNOSIS — N183 Chronic kidney disease, stage 3 unspecified: Secondary | ICD-10-CM

## 2013-09-02 DIAGNOSIS — E785 Hyperlipidemia, unspecified: Secondary | ICD-10-CM

## 2013-09-02 DIAGNOSIS — I5022 Chronic systolic (congestive) heart failure: Secondary | ICD-10-CM

## 2013-09-02 DIAGNOSIS — I059 Rheumatic mitral valve disease, unspecified: Secondary | ICD-10-CM

## 2013-09-02 DIAGNOSIS — I4891 Unspecified atrial fibrillation: Secondary | ICD-10-CM

## 2013-09-02 DIAGNOSIS — Z5181 Encounter for therapeutic drug level monitoring: Secondary | ICD-10-CM

## 2013-09-02 DIAGNOSIS — I6529 Occlusion and stenosis of unspecified carotid artery: Secondary | ICD-10-CM

## 2013-09-02 DIAGNOSIS — Z7901 Long term (current) use of anticoagulants: Secondary | ICD-10-CM

## 2013-09-02 DIAGNOSIS — I25810 Atherosclerosis of coronary artery bypass graft(s) without angina pectoris: Secondary | ICD-10-CM

## 2013-09-02 LAB — POCT INR: INR: 2.5

## 2013-09-02 NOTE — Patient Instructions (Signed)
Dr Aundra Dubin recommends that you walk regularly 5-6 times a week. Once you start walking regularly call if you get chest pain regularly. Dr Aundra Dubin can prescribe a medication, Ranexa, for you to help with chest pain.   Your physician recommends that you schedule a follow-up appointment in: 4 months with Dr Aundra Dubin. (November 2015).

## 2013-09-04 NOTE — Progress Notes (Signed)
Patient ID: Sean [name], male   DOB: [birth year], 80 y.o.   MRN: [id number] PCP: Dr. Elease Hashimoto  80 yo with history of CAD s/p CABG, ischemic cardiomyopathy, CKD, and paroxysmal atrial fibrillation presents for cardiology followup.  Since last appointment, he has been stable.  He tires easily but this has been the case for years.  He is short of breath after walking 125 feet up hill to his mailbox.  He is short of breath walking up a flight of steps or an incline.  He will occasionally walk for about 10 minutes on his treadmill with no problems.  No tachypalpitations.  He has chronic stable angina with substernal mild chest pain that occasionally occurs when he walks longer distances.  He will rest and it will resolve.  Sometimes he will take a NTG with relief.  He has had this pattern stably for a number of years.  He has not taken NTG recently at all as he really has not been walking much.  Weight is up about 4 lbs.  No significant post-prandial abdominal pain (has mesenteric artery stenosis). He still has bilateral hip pain that radiates down the back of his legs.  ABIs were normal and this pain is thought to be sciatica. Weight is down 3 lbs.   Labs (7/13): LDL 77, HDL 34 Labs (4/14): K 4.3, creatinine 2.0 Labs (5/14): K 4.2, creatinine 1.78 Labs (6/14): LDL 26, HDL 83 Labs (9/14): K 3.6, creatinine 2.0, BNP 308 Labs (6/15): K 3.9, creatinine 2.2, HCT 38.2, LDL 74, HDL 31  PMH; 1. History of DVT 2. HTN: ACEI cough. 3. Hyperlipidemia 4. CAD s/p CABG in 1991 with redo in 1997.  Chronic stable angina.  5. Ischemic cardiomyopathy: Echo in 4/13 with dilated LV, EF 30%, mildly dilated RV with mildly decreased RV function, moderate MR, mild AI.  - St Jude ICD with CRT upgrade in 9/10.  6. Carotid stenosis: 5/14 carotid dopplers with 40-59% bilateral ICA stenosis and occluded right vertebral.  5/15 carotid dopplers with 40-59% bilateral ICA stenosis.  7. GERD 8. CKD 9. OA 10. Paroxysmal atrial  fibrillation 11. Mitral regurgitation: Moderate on last echo. 12. VT: s/p ablation in 5/13.  13. H/o diverticulitis 14. H/o cholecystectomy 15. H/o appendectomy 16. Renal artery stenosis: Renal artery dopplers (5/14) with 60-99% proximal right renal artery stenosis as well as severe celiac artery stenosis.  17. ABIs (12/14) were normal. 18. Low back pain with sciatica  SH: Married, lives in [place], 2 kids, quit smoking in 1974.   FH: Mother, sister, and brother with CAD.  ROS: All systems reviewed and negative except as per HPI.   Current Outpatient Prescriptions  Medication Sig Dispense Refill  . carvedilol (COREG) 25 MG tablet Take 1 tablet (25 mg total) by mouth 2 (two) times daily.  180 tablet  3  . diphenhydramine-acetaminophen (TYLENOL PM) 25-500 MG TABS Take 1 tablet by mouth at bedtime as needed (sleep).       . docusate sodium (COLACE) 100 MG capsule Take 100 mg by mouth daily as needed. For constipation      . fenofibrate 160 MG tablet Take 160 mg by mouth daily.      . furosemide (LASIX) 40 MG tablet TAKE 2 TABLETS BY MOUTH EVERY MORNING AND 1 TABLET BY MOUTH EVERY AFTERNOON  90 tablet  6  . isosorbide mononitrate (IMDUR) 60 MG 24 hr tablet Take 60 mg by mouth daily.      Marland Kitchen losartan (COZAAR) 50 MG tablet  TAKE 1 TABLET BY MOUTH EVERY DAY  30 tablet  6  . nitroGLYCERIN (NITROSTAT) 0.4 MG SL tablet Place 0.4 mg under the tongue every 5 (five) minutes as needed for chest pain.      Marland Kitchen omeprazole (PRILOSEC) 20 MG capsule Take 20 mg by mouth 2 (two) times daily before a meal.      . potassium chloride SA (K-DUR,KLOR-CON) 20 MEQ tablet TAKE 1 TABLET BY MOUTH TWICE DAILY  60 tablet  6  . simvastatin (ZOCOR) 40 MG tablet Take 40 mg by mouth at bedtime.      . Tamsulosin HCl (FLOMAX) 0.4 MG CAPS Take 0.4 mg by mouth daily.       Marland Kitchen triamcinolone cream (KENALOG) 0.1 % Apply 1 application topically 2 (two) times daily.  30 g  1  . vitamin B-12 (CYANOCOBALAMIN) 500 MCG tablet Take 500  mcg by mouth daily.        Marland Kitchen warfarin (COUMADIN) 5 MG tablet Take 2.5-5 mg by mouth See admin instructions. Take 5 mg every Monday, Wednesday, Friday then take 2.5 mg the rest of the days      . [DISCONTINUED] triamcinolone (NASACORT AQ) 55 MCG/ACT nasal inhaler Place 2 sprays into the nose as needed.         No current facility-administered medications for this visit.   BP 110/48  Pulse 62  Ht 6\' 1"  (1.854 m)  Wt 201 lb (91.173 kg)  BMI 26.52 kg/m2 General: NAD Neck: No JVD, no thyromegaly or thyroid nodule.  Lungs: Clear to auscultation bilaterally with normal respiratory effort. CV: Nondisplaced PMI.  Heart regular S1/S2, no S3/S4, 2/6 HSM apex.  No peripheral edema.  R carotid bruit.  2+ left PT, unable to palpate right PT.  Abdomen: Soft, nontender, no hepatosplenomegaly, no distention.   Neurologic: Alert and oriented x 3.  Psych: Normal affect. Extremities: No clubbing or cyanosis.   Assessment/Plan:  1. CAD: s/p CABG.  Patient has chronic stable angina.  No change to pattern for a number of years.  He does take a nitroglycerin tab rarely, always with exertion. No chest pain at rest.  He has had less chest pain recently but also has become less active.  - Continue ASA 81, statin, ARB, Imdur, and Coreg.   - I would like to see him walk more for exercise, he has really become rather sedentary.  He will walk with his wife and will call me if he gets a significant amount of anginal pain doing this.  I can start him on ranolazine in that case.  2. Chronic systolic CHF: EF 23% on last echo.  He does not appear volume overloaded on exam.  Continue current Lasix dosing (80 qam, 40 qpm).  Given CKD, will not titrate losartan at this time or add spironolactone.  Continue current Coreg (at goal dose).  He has a [employer] CRT-D device. He had BMET in 6/15. 3. Hyperlipidemia: Lipids in 6/15 ok.   4. Mitral regurgitation: Moderate on last echo.  5. Carotid stenosis: Repeat carotid dopplers in 5/16.    6. Renal artery stenosis: Unilateral renal artery stenosis.  BP is controlled.  Dr. Maren Beach plan prior had been to forward study to nephrology for evaluation.  I am not sure there is a lot of utility to opening this artery in the absence of uncontrolled HTN or bilateral stenosis.   7. Celiac artery stenosis: Severe celiac stenosis on abdominal dopplers.  He does not describe abdominal angina. Continue to  follow.  8. Paroxysmal atrial fibrillation: Patient is on warfarin.  He is in NSR today.  9. CKD: Last creatinine was 2.2.  This has been trending up slowly.  Patient has followup with nephrology (Dr. Joelyn Oms).   10.  Hip and leg pain: ABIs normal.  I suspect that this is sciatica.  [name] 09/04/2013

## 2013-09-13 ENCOUNTER — Other Ambulatory Visit: Payer: Self-pay | Admitting: Cardiology

## 2013-09-23 ENCOUNTER — Ambulatory Visit (INDEPENDENT_AMBULATORY_CARE_PROVIDER_SITE_OTHER): Payer: Medicare Other

## 2013-09-23 DIAGNOSIS — Z7901 Long term (current) use of anticoagulants: Secondary | ICD-10-CM

## 2013-09-23 DIAGNOSIS — I4891 Unspecified atrial fibrillation: Secondary | ICD-10-CM

## 2013-09-23 DIAGNOSIS — Z5181 Encounter for therapeutic drug level monitoring: Secondary | ICD-10-CM

## 2013-09-23 DIAGNOSIS — I059 Rheumatic mitral valve disease, unspecified: Secondary | ICD-10-CM

## 2013-09-23 LAB — POCT INR: INR: 2.9

## 2013-10-13 ENCOUNTER — Ambulatory Visit (INDEPENDENT_AMBULATORY_CARE_PROVIDER_SITE_OTHER): Payer: Medicare Other | Admitting: Internal Medicine

## 2013-10-13 ENCOUNTER — Encounter: Payer: Self-pay | Admitting: Internal Medicine

## 2013-10-13 VITALS — BP 118/50 | HR 60 | Ht 73.0 in | Wt 204.2 lb

## 2013-10-13 DIAGNOSIS — I4729 Other ventricular tachycardia: Secondary | ICD-10-CM

## 2013-10-13 DIAGNOSIS — I5022 Chronic systolic (congestive) heart failure: Secondary | ICD-10-CM

## 2013-10-13 DIAGNOSIS — I472 Ventricular tachycardia, unspecified: Secondary | ICD-10-CM

## 2013-10-13 DIAGNOSIS — I255 Ischemic cardiomyopathy: Secondary | ICD-10-CM

## 2013-10-13 DIAGNOSIS — I2589 Other forms of chronic ischemic heart disease: Secondary | ICD-10-CM

## 2013-10-13 DIAGNOSIS — I480 Paroxysmal atrial fibrillation: Secondary | ICD-10-CM

## 2013-10-13 DIAGNOSIS — I25810 Atherosclerosis of coronary artery bypass graft(s) without angina pectoris: Secondary | ICD-10-CM

## 2013-10-13 DIAGNOSIS — I4891 Unspecified atrial fibrillation: Secondary | ICD-10-CM

## 2013-10-13 DIAGNOSIS — Z95810 Presence of automatic (implantable) cardiac defibrillator: Secondary | ICD-10-CM

## 2013-10-13 LAB — MDC_IDC_ENUM_SESS_TYPE_INCLINIC
Battery Remaining Longevity: 48 mo
Brady Statistic RA Percent Paced: 48 %
Brady Statistic RV Percent Paced: 99.22 %
Date Time Interrogation Session: 20150910100948
HighPow Impedance: 43.2794
Implantable Pulse Generator Serial Number: 7183458
Lead Channel Impedance Value: 375 Ohm
Lead Channel Pacing Threshold Amplitude: 0.5 V
Lead Channel Pacing Threshold Amplitude: 1.75 V
Lead Channel Pacing Threshold Pulse Width: 0.5 ms
Lead Channel Pacing Threshold Pulse Width: 0.5 ms
Lead Channel Pacing Threshold Pulse Width: 0.5 ms
Lead Channel Pacing Threshold Pulse Width: 1.2 ms
Lead Channel Sensing Intrinsic Amplitude: 2.1 mV
Lead Channel Setting Pacing Amplitude: 2 V
Lead Channel Setting Pacing Pulse Width: 0.5 ms
Lead Channel Setting Sensing Sensitivity: 2 mV
MDC IDC MSMT LEADCHNL LV IMPEDANCE VALUE: 337.5 Ohm
MDC IDC MSMT LEADCHNL LV PACING THRESHOLD AMPLITUDE: 1.75 V
MDC IDC MSMT LEADCHNL LV PACING THRESHOLD PULSEWIDTH: 1.2 ms
MDC IDC MSMT LEADCHNL RA PACING THRESHOLD AMPLITUDE: 0.75 V
MDC IDC MSMT LEADCHNL RA PACING THRESHOLD AMPLITUDE: 0.75 V
MDC IDC MSMT LEADCHNL RV IMPEDANCE VALUE: 450 Ohm
MDC IDC MSMT LEADCHNL RV PACING THRESHOLD AMPLITUDE: 0.5 V
MDC IDC MSMT LEADCHNL RV PACING THRESHOLD PULSEWIDTH: 0.5 ms
MDC IDC MSMT LEADCHNL RV SENSING INTR AMPL: 11.3 mV
MDC IDC SET LEADCHNL LV PACING AMPLITUDE: 2.75 V
MDC IDC SET LEADCHNL LV PACING PULSEWIDTH: 1.2 ms
MDC IDC SET LEADCHNL RV PACING AMPLITUDE: 2.5 V
MDC IDC SET ZONE DETECTION INTERVAL: 250 ms
Zone Setting Detection Interval: 280 ms
Zone Setting Detection Interval: 350 ms

## 2013-10-13 MED ORDER — RANOLAZINE ER 500 MG PO TB12: 500.0000 mg | ORAL_TABLET | Freq: Two times a day (BID) | ORAL | Status: DC

## 2013-10-13 NOTE — Patient Instructions (Addendum)
Remote monitoring is used to monitor your ICD from home. This monitoring reduces the number of office visits required to check your device to one time per year. It allows [place] to keep an eye on the functioning of your device to ensure it is working properly. You are scheduled for a device check from home on 01-16-2014. You may send your transmission at any time that day. If you have a wireless device, the transmission will be sent automatically. After your physician reviews your transmission, you will receive a postcard with your next transmission date.  Your physician recommends that you schedule a follow-up appointment in: 12 months with [place]  Your physician has recommended you make the following change in your medication:  1) STOP SIMVASTATIN 2) START RANEXA on December 04, 2013  Your physician has requested that you have an AV optimization echocardiogram. Echocardiography is a painless test that uses sound waves to create images of your heart. It provides your doctor with information about the size and shape of your heart and how well your heart's chambers and valves are working. This procedure takes approximately one hour. There are no restrictions for this procedure.

## 2013-10-13 NOTE — Progress Notes (Signed)
Patient Care Team: Eulas Post, MD as PCP - General   HPI  Sean [name] is a 80 y.o. male  followup for ventricular tachycardia in the setting of ischemic heart disease with prior CABG and a prior ICD implantation.   He underwent CRT upgrade in September 2010  He  developed ventricular tachycardia for which he was then ultimately submitted to catheter ablation by Dr. Lovena Le. May 2013. Targeted areas were   left ventricular septum. He was rendered noninducible.   Echo EF 2013  30%  Cath Final Conclusions 2013 :  1. Continued patency of the LIMA to the LAD diagonal  2. Continued patency of the stent OM2 graft  3. Known occlusion of the three remaining SVG  4. Known occlusion of the RCA       He he has some mild edema. He has complaints of exercise intolerance manifested both by chest discomfort leg weakness and shortness of breath.  Efforts in the past increase his nitrates were complicated by hypotension Past Medical History  Diagnosis Date  . HYPERLIPIDEMIA 04/07/2008  . HYPERTENSION 04/07/2008  . CAD, ARTERY BYPASS GRAFT 05/22/2008  . CARDIOMYOPATHY, ISCHEMIC 05/22/2008  . MITRAL VALVE PROLAPSE, NON-RHEUMATIC 05/22/2008  . AV BLOCK 05/22/2008  . Chronic systolic heart failure 0/93/2671  . CAROTID ARTERY STENOSIS 11/15/2009  . GERD 04/07/2008  . RENAL INSUFFICIENCY 10/17/2008  . DEGENERATIVE JOINT DISEASE, LEFT KNEE 04/07/2008  . COLONIC POLYPS, HX OF 04/07/2008  . DIVERTICULITIS, HX OF 04/07/2008  . Atrial fibrillation 04/04/2010  . Decreased left ventricular function   . Ventricular tachycardia     Status post catheter ablation may 2013  . CHF (congestive heart failure)     class 2 to 3    Past Surgical History  Procedure Laterality Date  . Cardiac surgery      open heart 1991, 1997, pacemaker insertion [place] 2010 CD 3231  . Lung surgery  1987  . Hernia repair  1952    2004  . Cholecystectomy    . Appendectomy    . Thoracotomy      secondary to lung mass  .  Insert / replace / remove pacemaker      St Jude unify CD 3231/2010  . Colonoscopy N/A 08/26/2013    Procedure: COLONOSCOPY;  Surgeon: Beryle Beams, MD;  Location: [facility];  Service: Endoscopy;  Laterality: N/A;    Current Outpatient Prescriptions  Medication Sig Dispense Refill  . carvedilol (COREG) 25 MG tablet Take 1 tablet (25 mg total) by mouth 2 (two) times daily.  180 tablet  3  . diphenhydramine-acetaminophen (TYLENOL PM) 25-500 MG TABS Take 1 tablet by mouth at bedtime as needed (sleep).       . docusate sodium (COLACE) 100 MG capsule Take 100 mg by mouth daily as needed. For constipation      . fenofibrate 160 MG tablet Take 160 mg by mouth daily.      . furosemide (LASIX) 40 MG tablet TAKE 2 TABLETS BY MOUTH EVERY MORNING AND 1 TABLET BY MOUTH EVERY AFTERNOON  90 tablet  6  . isosorbide mononitrate (IMDUR) 60 MG 24 hr tablet Take 60 mg by mouth daily.      Marland Kitchen losartan (COZAAR) 50 MG tablet TAKE 1 TABLET BY MOUTH EVERY DAY  30 tablet  6  . nitroGLYCERIN (NITROSTAT) 0.4 MG SL tablet Place 0.4 mg under the tongue every 5 (five) minutes as needed for chest pain.      Marland Kitchen omeprazole (  PRILOSEC) 20 MG capsule Take 20 mg by mouth 2 (two) times daily before a meal.      . potassium chloride SA (K-DUR,KLOR-CON) 20 MEQ tablet TAKE 1 TABLET BY MOUTH TWICE DAILY  60 tablet  6  . simvastatin (ZOCOR) 40 MG tablet Take 40 mg by mouth at bedtime.      . Tamsulosin HCl (FLOMAX) 0.4 MG CAPS Take 0.4 mg by mouth daily.       Marland Kitchen triamcinolone cream (KENALOG) 0.1 % Apply 1 application topically as needed.      . vitamin B-12 (CYANOCOBALAMIN) 500 MCG tablet Take 500 mcg by mouth daily.        Marland Kitchen warfarin (COUMADIN) 5 MG tablet Take 2.5-5 mg by mouth See admin instructions. Take 5 mg every Monday, Wednesday, Friday then take 2.5 mg the rest of the days      . [DISCONTINUED] triamcinolone (NASACORT AQ) 55 MCG/ACT nasal inhaler Place 2 sprays into the nose as needed.         No current  facility-administered medications for this visit.    Allergies  Allergen Reactions  . Carisoprodol     REACTION: rash  . Lisinopril     REACTION: cough  . Tape     Cloth Tape tears skin  . Zolpidem Tartrate Other (See Comments)    Altered mental status  . Penicillins Rash    Review of Systems negative except from HPI and PMH  Physical Exam BP 118/50  Pulse 60  Ht 6\' 1"  (1.854 m)  Wt 204 lb 3.2 oz (92.625 kg)  BMI 26.95 kg/m2 Well developed and well nourished in no acute distress HENT normal E scleral and icterus clear Neck Supple JVP flat; carotids brisk and full Clear to ausculation  Regular rate and rhythm, 2/6  Murmur apex  Soft   No clubbing cyanosis none Edema Alert and oriented, grossly normal motor and sensory function Skin Warm and Dry  ECG demonstrates P. Synchronous pacing with a negative QRS in lead V1  Assessment and  Plan  Ischemic cardiomyopathy  Complete heart block  Exertional chest discomfort    Congestive heart failure-chronic-systolic  CRT-D St Jude The patient's device was interrogated.  The information was reviewed. No changes were made in the programming.     Lower extremity myalgias   He underwent generator replacement without difficulties   he is having recurrent exertional chest discomfort. He is non-revascularizable coronary disease. we will initiate Ranexa prior to his visit with Dr. DM. He would be able to assess its effectiveness.    in addition, plan to undertake AV optimization echo to see if we can improve functional capacity.  With his exertional leg symptoms, we will hold his statins. Arterial Dopplers 12/14 were normal

## 2013-10-17 ENCOUNTER — Encounter: Payer: Self-pay | Admitting: Internal Medicine

## 2013-10-21 ENCOUNTER — Ambulatory Visit (INDEPENDENT_AMBULATORY_CARE_PROVIDER_SITE_OTHER): Payer: Medicare Other

## 2013-10-21 DIAGNOSIS — Z7901 Long term (current) use of anticoagulants: Secondary | ICD-10-CM

## 2013-10-21 DIAGNOSIS — I059 Rheumatic mitral valve disease, unspecified: Secondary | ICD-10-CM

## 2013-10-21 DIAGNOSIS — I4891 Unspecified atrial fibrillation: Secondary | ICD-10-CM

## 2013-10-21 DIAGNOSIS — Z5181 Encounter for therapeutic drug level monitoring: Secondary | ICD-10-CM

## 2013-10-21 LAB — POCT INR: INR: 2.9

## 2013-10-24 ENCOUNTER — Other Ambulatory Visit: Payer: Self-pay | Admitting: Family Medicine

## 2013-10-25 ENCOUNTER — Ambulatory Visit (HOSPITAL_COMMUNITY): Payer: Medicare Other | Attending: Family Medicine | Admitting: Radiology

## 2013-10-25 DIAGNOSIS — I10 Essential (primary) hypertension: Secondary | ICD-10-CM | POA: Insufficient documentation

## 2013-10-25 DIAGNOSIS — E785 Hyperlipidemia, unspecified: Secondary | ICD-10-CM | POA: Diagnosis not present

## 2013-10-25 DIAGNOSIS — I059 Rheumatic mitral valve disease, unspecified: Secondary | ICD-10-CM | POA: Diagnosis not present

## 2013-10-25 DIAGNOSIS — I480 Paroxysmal atrial fibrillation: Secondary | ICD-10-CM

## 2013-10-25 DIAGNOSIS — I509 Heart failure, unspecified: Secondary | ICD-10-CM | POA: Insufficient documentation

## 2013-10-25 DIAGNOSIS — I255 Ischemic cardiomyopathy: Secondary | ICD-10-CM

## 2013-10-25 DIAGNOSIS — I4891 Unspecified atrial fibrillation: Secondary | ICD-10-CM | POA: Insufficient documentation

## 2013-10-25 DIAGNOSIS — I5022 Chronic systolic (congestive) heart failure: Secondary | ICD-10-CM

## 2013-10-25 DIAGNOSIS — I079 Rheumatic tricuspid valve disease, unspecified: Secondary | ICD-10-CM | POA: Diagnosis not present

## 2013-10-25 DIAGNOSIS — I2510 Atherosclerotic heart disease of native coronary artery without angina pectoris: Secondary | ICD-10-CM | POA: Insufficient documentation

## 2013-10-25 DIAGNOSIS — I517 Cardiomegaly: Secondary | ICD-10-CM | POA: Insufficient documentation

## 2013-10-25 DIAGNOSIS — I359 Nonrheumatic aortic valve disorder, unspecified: Secondary | ICD-10-CM | POA: Diagnosis not present

## 2013-10-25 DIAGNOSIS — I2589 Other forms of chronic ischemic heart disease: Secondary | ICD-10-CM | POA: Diagnosis not present

## 2013-10-25 NOTE — Progress Notes (Signed)
AV Optimization Echocardiogram performed with Dr Caryl Comes.

## 2013-10-30 ENCOUNTER — Other Ambulatory Visit: Payer: Self-pay | Admitting: Cardiology

## 2013-10-31 ENCOUNTER — Telehealth: Payer: Self-pay | Admitting: Internal Medicine

## 2013-10-31 NOTE — Telephone Encounter (Signed)
Appt made for 9/29 @ 2:00pm while SK is in ofc to see if enabling RR on 9/22 is root of  CP. I encouraged wife to send pt to ER if CP persists. Wife voiced understanding.

## 2013-10-31 NOTE — Telephone Encounter (Signed)
New message     Pt had pacemaker adjusted on 10-25-13.  Ever since then he has had chest pain and arm pain when he tries to walk.  He has to use nitro to get to the mailbox and back.  Prior to pacemaker adjustment, pt was walking a mile around the block.  Wife want to talk to someone from the device clinic

## 2013-11-01 ENCOUNTER — Encounter: Payer: Self-pay | Admitting: Internal Medicine

## 2013-11-01 ENCOUNTER — Ambulatory Visit: Payer: Medicare Other | Admitting: *Deleted

## 2013-11-01 DIAGNOSIS — I5022 Chronic systolic (congestive) heart failure: Secondary | ICD-10-CM

## 2013-11-01 DIAGNOSIS — I480 Paroxysmal atrial fibrillation: Secondary | ICD-10-CM

## 2013-11-01 DIAGNOSIS — I25709 Atherosclerosis of coronary artery bypass graft(s), unspecified, with unspecified angina pectoris: Secondary | ICD-10-CM

## 2013-11-01 DIAGNOSIS — I255 Ischemic cardiomyopathy: Secondary | ICD-10-CM

## 2013-11-01 LAB — MDC_IDC_ENUM_SESS_TYPE_INCLINIC
Brady Statistic RA Percent Paced: 58 %
HIGH POWER IMPEDANCE MEASURED VALUE: 44.775
Implantable Pulse Generator Serial Number: 7183458
Lead Channel Impedance Value: 412.5 Ohm
Lead Channel Impedance Value: 525 Ohm
Lead Channel Sensing Intrinsic Amplitude: 1.9 mV
Lead Channel Sensing Intrinsic Amplitude: 12 mV
Lead Channel Setting Pacing Amplitude: 2 V
Lead Channel Setting Pacing Amplitude: 2.5 V
Lead Channel Setting Pacing Pulse Width: 1.2 ms
Lead Channel Setting Sensing Sensitivity: 2 mV
MDC IDC MSMT BATTERY REMAINING LONGEVITY: 58.8 mo
MDC IDC MSMT LEADCHNL LV IMPEDANCE VALUE: 325 Ohm
MDC IDC SESS DTM: 20150929163741
MDC IDC SET LEADCHNL LV PACING AMPLITUDE: 2.5 V
MDC IDC SET LEADCHNL RV PACING PULSEWIDTH: 0.5 ms
MDC IDC SET ZONE DETECTION INTERVAL: 250 ms
MDC IDC SET ZONE DETECTION INTERVAL: 280 ms
MDC IDC STAT BRADY RV PERCENT PACED: 98 %
Zone Setting Detection Interval: 350 ms

## 2013-11-01 NOTE — Progress Notes (Signed)
Pt presents to office with c/o CP w/any exertion since rate response was turned on a week ago (N/C).  Per pt CP is typically an 8/10 on pain scale with radiation to arms and once to legs. CP is often relieved with (1) NTG and once with (2) NTG. H/o CAD. Pt walked in ofc (2 laps) after RR was permanently turned off---no CP. Lexiscan was ordered---ok per SK. Pt to F/U as scheduled.

## 2013-11-04 ENCOUNTER — Encounter: Payer: Self-pay | Admitting: Cardiology

## 2013-11-08 ENCOUNTER — Ambulatory Visit (HOSPITAL_COMMUNITY): Payer: Medicare Other | Attending: Cardiology | Admitting: Radiology

## 2013-11-08 VITALS — BP 112/61 | HR 60 | Ht 73.0 in | Wt 201.0 lb

## 2013-11-08 DIAGNOSIS — I2510 Atherosclerotic heart disease of native coronary artery without angina pectoris: Secondary | ICD-10-CM | POA: Diagnosis not present

## 2013-11-08 DIAGNOSIS — I4891 Unspecified atrial fibrillation: Secondary | ICD-10-CM | POA: Diagnosis not present

## 2013-11-08 DIAGNOSIS — R0789 Other chest pain: Secondary | ICD-10-CM

## 2013-11-08 DIAGNOSIS — R079 Chest pain, unspecified: Secondary | ICD-10-CM | POA: Insufficient documentation

## 2013-11-08 DIAGNOSIS — R0600 Dyspnea, unspecified: Secondary | ICD-10-CM | POA: Insufficient documentation

## 2013-11-08 DIAGNOSIS — I25709 Atherosclerosis of coronary artery bypass graft(s), unspecified, with unspecified angina pectoris: Secondary | ICD-10-CM

## 2013-11-08 DIAGNOSIS — I10 Essential (primary) hypertension: Secondary | ICD-10-CM | POA: Insufficient documentation

## 2013-11-08 MED ORDER — TECHNETIUM TC 99M SESTAMIBI GENERIC - CARDIOLITE
33.0000 | Freq: Once | INTRAVENOUS | Status: AC | PRN
  Administered 2013-11-08: 33 via INTRAVENOUS

## 2013-11-08 MED ORDER — ADENOSINE (DIAGNOSTIC) 3 MG/ML IV SOLN
0.5600 mg/kg | Freq: Once | INTRAVENOUS | Status: AC
  Administered 2013-11-08: 51 mg via INTRAVENOUS

## 2013-11-08 MED ORDER — TECHNETIUM TC 99M SESTAMIBI GENERIC - CARDIOLITE
11.0000 | Freq: Once | INTRAVENOUS | Status: AC | PRN
  Administered 2013-11-08: 11 via INTRAVENOUS

## 2013-11-08 NOTE — Progress Notes (Signed)
[facility] [address] [place], [place] [address] [phone]    Cardiology Nuclear Med Study  Sean [name] is a 80 y.o. male     MRN : [id number]     DOB: 20-Feb-1931  Procedure Date: 11/08/2013  Nuclear Med Background Indication for Stress Test:  Evaluation for Ischemia and Follow up CAD History:  CAD, MPI 2005 (scar) EF 41%, AICD, PTVP, Afib Cardiac Risk Factors: Carotid Disease and Hypertension  Symptoms:  Chest Pain with Exertion (last date of chest discomfort was two days ago) and DOE   Nuclear Pre-Procedure Caffeine/Decaff Intake:  None NPO After: 7:00pm   Lungs:  clear O2 Sat: 95% on room air. IV 0.9% NS with Angio Cath:  22g  IV Site: R Hand  IV Started by:  Matilde Haymaker, RN  Chest Size (in):  42 Cup Size: n/a  Height: 6\' 1"  (1.854 m)  Weight:  201 lb (91.173 kg)  BMI:  Body mass index is 26.52 kg/(m^2). Tech Comments:  Coreg taken ao 0630 today    Nuclear Med Study 1 or 2 day study: 1 day  Stress Test Type:  Adenosine  Reading MD: n/a  Order Authorizing Provider:  Herbert Pun  Resting Radionuclide: Technetium 58m Sestamibi  Resting Radionuclide Dose: 11.0 mCi   Stress Radionuclide:  Technetium 10m Sestamibi  Stress Radionuclide Dose: 33.0 mCi           Stress Protocol Rest HR: 60 Stress HR: 63  Rest BP: 112/61 Stress BP: 106/54  Exercise Time (min): n/a METS: n/a           Dose of Adenosine (mg):  51.2 mg Dose of Lexiscan: n/a mg  Dose of Atropine (mg): n/a Dose of Dobutamine: n/a mcg/kg/min (at max HR)  Stress Test Technologist: Glade Lloyd, BS-ES  Nuclear Technologist:  Earl Many, CNMT     Rest Procedure:  Myocardial perfusion imaging was performed at rest 45 minutes following the intravenous administration of Technetium 36m Sestamibi. Rest ECG: The rhythm is paced.  Stress Procedure:  The patient received IV adenosine at 140 mcg/kg/min for 4 minutes.  Technetium 3m Sestamibi was injected at the  2 minute mark and quantitative spect images were obtained after a 45 minute delay.  Patient did not have any symptoms with infusion.  Stress ECG: No significant change from baseline ECG  QPS Raw Data Images:  Normal; no motion artifact; normal heart/lung ratio. Stress Images:  There is a large area of moderate/severe decreased activity affecting the base/mid-inferoseptal segments , the base/mid/apical -inferior segments , the apical cap , the base/mid inferolateral segments , and the base/mid anterolateral segments  Rest Images:  The rest images are the same as the stress images.  Subtraction (SDS):  No evidence of ischemia. Transient Ischemic Dilatation (Normal <1.22):  1.04 Lung/Heart Ratio (Normal <0.45):  0.34  Quantitative Gated Spect Images QGS EDV:  n/a QGS ESV:  n/a  Impression Exercise Capacity:  Adenosine study with no exercise. BP Response:  Normal blood pressure response. Clinical Symptoms:  No significant symptoms noted. ECG Impression:  No significant ST segment change suggestive of ischemia. Comparison with Prior Nuclear Study: The study is compared with a study report from February, 2005.  Overall Impression:  The study is abnormal. The study is at least moderate risk. Because it is not gated, I cannot be sure what the ejection fraction is. There is a large area of scar with moderately severe intensity. This area of scar affects  the entire inferior wall and the base/mid aspects of the inferoseptal and inferolateral and anterolateral walls. I cannot be sure if this has changed since 2005. At that time the ejection fraction was 41%. The area of scar was described in the inferior wall and apex at that time. Without direct comparison, I cannot say if the area of scar is larger or not. There is no significant ischemia at this time.  LV Ejection Fraction: Study not gated.  LV Wall Motion:  Study not gated.  Dola Argyle, MD

## 2013-11-22 ENCOUNTER — Other Ambulatory Visit: Payer: Self-pay | Admitting: *Deleted

## 2013-11-22 ENCOUNTER — Other Ambulatory Visit: Payer: Self-pay | Admitting: Internal Medicine

## 2013-11-22 NOTE — Telephone Encounter (Signed)
Refill done as requested 

## 2013-12-02 ENCOUNTER — Ambulatory Visit (INDEPENDENT_AMBULATORY_CARE_PROVIDER_SITE_OTHER): Payer: Medicare Other | Admitting: *Deleted

## 2013-12-02 DIAGNOSIS — I4891 Unspecified atrial fibrillation: Secondary | ICD-10-CM

## 2013-12-02 DIAGNOSIS — I480 Paroxysmal atrial fibrillation: Secondary | ICD-10-CM

## 2013-12-02 DIAGNOSIS — Z7901 Long term (current) use of anticoagulants: Secondary | ICD-10-CM

## 2013-12-02 DIAGNOSIS — Z5181 Encounter for therapeutic drug level monitoring: Secondary | ICD-10-CM

## 2013-12-02 DIAGNOSIS — I059 Rheumatic mitral valve disease, unspecified: Secondary | ICD-10-CM

## 2013-12-02 LAB — POCT INR: INR: 2.1

## 2013-12-11 ENCOUNTER — Other Ambulatory Visit: Payer: Self-pay | Admitting: Cardiology

## 2013-12-30 ENCOUNTER — Other Ambulatory Visit: Payer: Self-pay | Admitting: Family Medicine

## 2014-01-06 ENCOUNTER — Ambulatory Visit (INDEPENDENT_AMBULATORY_CARE_PROVIDER_SITE_OTHER): Payer: Medicare Other | Admitting: *Deleted

## 2014-01-06 ENCOUNTER — Ambulatory Visit (INDEPENDENT_AMBULATORY_CARE_PROVIDER_SITE_OTHER): Payer: Medicare Other | Admitting: Cardiology

## 2014-01-06 VITALS — BP 102/58 | HR 60 | Ht 73.0 in | Wt 202.5 lb

## 2014-01-06 DIAGNOSIS — I25810 Atherosclerosis of coronary artery bypass graft(s) without angina pectoris: Secondary | ICD-10-CM

## 2014-01-06 DIAGNOSIS — I480 Paroxysmal atrial fibrillation: Secondary | ICD-10-CM

## 2014-01-06 DIAGNOSIS — I4891 Unspecified atrial fibrillation: Secondary | ICD-10-CM

## 2014-01-06 DIAGNOSIS — Z7901 Long term (current) use of anticoagulants: Secondary | ICD-10-CM

## 2014-01-06 DIAGNOSIS — E785 Hyperlipidemia, unspecified: Secondary | ICD-10-CM

## 2014-01-06 DIAGNOSIS — I059 Rheumatic mitral valve disease, unspecified: Secondary | ICD-10-CM

## 2014-01-06 DIAGNOSIS — Z5181 Encounter for therapeutic drug level monitoring: Secondary | ICD-10-CM

## 2014-01-06 DIAGNOSIS — I5022 Chronic systolic (congestive) heart failure: Secondary | ICD-10-CM

## 2014-01-06 LAB — POCT INR: INR: 2.3

## 2014-01-06 MED ORDER — ATORVASTATIN CALCIUM 20 MG PO TABS: 20.0000 mg | ORAL_TABLET | Freq: Every day | ORAL | Status: DC

## 2014-01-06 MED ORDER — ISOSORBIDE MONONITRATE ER 30 MG PO TB24: ORAL_TABLET | ORAL | Status: DC

## 2014-01-06 NOTE — Patient Instructions (Signed)
Increase Imdur (isosorbide) to 90mg  daily.  This will be 1 of a 30mg  tablet and 1 of a 60mg  tablet daily at the same time.  Start atorvastatin 20mg  daily.  Your physician recommends that you have  lab work today--BMET/CBCd.  Your physician recommends that you return for a FASTING lipid profile/liver profile in 2 months.  Your physician recommends that you schedule a follow-up appointment in: 3 months with Dr Aundra Dubin.

## 2014-01-07 ENCOUNTER — Encounter: Payer: Self-pay | Admitting: Cardiology

## 2014-01-07 NOTE — Progress Notes (Signed)
Patient ID: Sean [name], male   DOB: January 06, 1932, 80 y.o.   MRN: [id number] PCP: Dr. Elease Hashimoto  80 yo with history of CAD s/p CABG, ischemic cardiomyopathy, CKD, and paroxysmal atrial fibrillation presents for cardiology followup.  A few weeks ago, he was having more than his usual chronic stable angina.  He started ranolazine and angina has significantly decreased.  He walks with his wife most days and is not having to use NTG.  Rare chest pain with heavy exertion now (last NTG use about 2 weeks ago walking up steep hill).  He is not short of breath walking slowly on flat ground.  He is short of breath with steps. No significant post-prandial abdominal pain (has mesenteric artery stenosis). He still has bilateral hip pain that radiates down the back of his legs.  ABIs were normal and this pain is thought to be sciatica. Weight is stable.  He was taken off Zocor to see if this would help leg pain, but this has made no difference.   Labs (7/13): LDL 77, HDL 34 Labs (4/14): K 4.3, creatinine 2.0 Labs (5/14): K 4.2, creatinine 1.78 Labs (6/14): LDL 26, HDL 83 Labs (9/14): K 3.6, creatinine 2.0, BNP 308 Labs (6/15): K 3.9, creatinine 2.2, HCT 38.2, LDL 74, HDL 31  ECG: A-V sequential pacing QTc 530 msec  PMH; 1. History of DVT 2. HTN: ACEI cough. 3. Hyperlipidemia 4. CAD s/p CABG in 1991 with redo in 1997.  [place] 2013 with SVG-OM patent and LIMA-LAD patent, other 3 SVGs closed.  Chronic stable angina.  5. Ischemic cardiomyopathy: Echo in 4/13 with dilated LV, EF 30%, mildly dilated RV with mildly decreased RV function, moderate MR, mild AI.  - St Jude ICD with CRT upgrade in 9/10.  6. Carotid stenosis: 5/14 carotid dopplers with 40-59% bilateral ICA stenosis and occluded right vertebral.  5/15 carotid dopplers with 40-59% bilateral ICA stenosis.  7. GERD 8. CKD 9. OA 10. Paroxysmal atrial fibrillation 11. Mitral regurgitation: Moderate on last echo. 12. VT: s/p ablation in 5/13.  13. H/o  diverticulitis 14. H/o cholecystectomy 15. H/o appendectomy 16. Renal artery stenosis: Renal artery dopplers (5/14) with 60-99% proximal right renal artery stenosis as well as severe celiac artery stenosis.  17. ABIs (12/14) were normal. 18. Low back pain with sciatica  SH: Married, lives in [place], 2 kids, quit smoking in 1974.   FH: Mother, sister, and brother with CAD.  ROS: All systems reviewed and negative except as per HPI.   Current Outpatient Prescriptions  Medication Sig Dispense Refill  . atorvastatin (LIPITOR) 20 MG tablet Take 1 tablet (20 mg total) by mouth daily. 30 tablet 3  . carvedilol (COREG) 25 MG tablet Take 1 tablet (25 mg total) by mouth 2 (two) times daily. 180 tablet 3  . diphenhydramine-acetaminophen (TYLENOL PM) 25-500 MG TABS Take 1 tablet by mouth at bedtime as needed (sleep).     . docusate sodium (COLACE) 100 MG capsule Take 100 mg by mouth daily as needed. For constipation    . furosemide (LASIX) 40 MG tablet TAKE 2 TABLETS BY MOUTH EVERY MORNING AND 1 TABLET BY MOUTH EVERY AFTERNOON 90 tablet 6  . isosorbide mononitrate (IMDUR) 30 MG 24 hr tablet Take 1 tablet daily (30mg ) with a Imdur (isosorbide) 60mg  tablet for a total of 90mg  daily 30 tablet 6  . isosorbide mononitrate (IMDUR) 60 MG 24 hr tablet TAKE 1 TABLET BY MOUTH EVERY DAY 90 tablet 0  . losartan (COZAAR) 50 MG  tablet TAKE 1 TABLET BY MOUTH EVERY DAY 30 tablet 6  . nitroGLYCERIN (NITROSTAT) 0.4 MG SL tablet Place 0.4 mg under the tongue every 5 (five) minutes as needed for chest pain.    Marland Kitchen omeprazole (PRILOSEC) 20 MG capsule Take 20 mg by mouth 2 (two) times daily before a meal.    . potassium chloride SA (K-DUR,KLOR-CON) 20 MEQ tablet TAKE 1 TABLET BY MOUTH TWICE DAILY 60 tablet 6  . triamcinolone cream (KENALOG) 0.1 % Apply 1 application topically as needed.    . vitamin B-12 (CYANOCOBALAMIN) 500 MCG tablet Take 500 mcg by mouth daily.      Marland Kitchen warfarin (COUMADIN) 5 MG tablet Take as directed  by coumadin clinic 30 tablet 3  . fenofibrate 160 MG tablet Take 160 mg by mouth daily.    . fenofibrate 160 MG tablet TAKE 1 TABLET BY MOUTH EVERY DAY 60 tablet 1  . NITROSTAT 0.4 MG SL tablet PLACE 1 TABLET UNDER THE TONGUE EVERY 5 MINUES AS NEEDED FOR CHEST PAIN (Patient not taking: Reported on 01/06/2014) 25 tablet 0  . omeprazole (PRILOSEC) 20 MG capsule TAKE ONE CAPSULE BY MOUTH TWICE DAILY (Patient not taking: Reported on 01/06/2014) 180 capsule 0  . ranolazine (RANEXA) 500 MG 12 hr tablet Take 1 tablet (500 mg total) by mouth 2 (two) times daily. 90 tablet 1  . Tamsulosin HCl (FLOMAX) 0.4 MG CAPS Take 0.4 mg by mouth daily.     . [DISCONTINUED] triamcinolone (NASACORT AQ) 55 MCG/ACT nasal inhaler Place 2 sprays into the nose as needed.       No current facility-administered medications for this visit.   BP 102/58 mmHg  Pulse 60  Ht 6\' 1"  (1.854 m)  Wt 202 lb 8 oz (91.853 kg)  BMI 26.72 kg/m2 General: NAD Neck: No JVD, no thyromegaly or thyroid nodule.  Lungs: Clear to auscultation bilaterally with normal respiratory effort. CV: Nondisplaced PMI.  Heart regular S1/S2, no S3/S4, 2/6 HSM apex.  No peripheral edema.  R carotid bruit.  2+ left PT, unable to palpate right PT.  Abdomen: Soft, nontender, no hepatosplenomegaly, no distention.   Neurologic: Alert and oriented x 3.  Psych: Normal affect. Extremities: No clubbing or cyanosis.   Assessment/Plan:  1. CAD: s/p CABG.  Chronic stable angina, rare chest pain now that he is using ranolazine.   - Continue ASA 81, ranolazine, ARB, and Coreg.   - Given occasional residual angina, I will increase Imdur to 90 mg daily.  - Restart statin, use atorvastatin 20 mg daily.  2. Chronic systolic CHF: EF 55% on last echo.  He does not appear volume overloaded on exam.  Continue current Lasix dosing (80 qam, 40 qpm).  Given CKD, will not titrate losartan at this time or add spironolactone.  Continue current Coreg (at goal dose).  He has a [employer]  CRT-D device.  - BMET today.  3. Hyperlipidemia: No improvement in leg pain after stopping simvastatin, probably not statin-related.  I will start him on atorvastatin 20 mg daily with lipids/LFTs in 2 months.    4. Mitral regurgitation: Moderate on last echo.  5. Carotid stenosis: Repeat carotid dopplers in 5/16.   6. Renal artery stenosis: Unilateral renal artery stenosis.  BP is controlled.  Dr. Maren Beach plan prior had been to forward study to nephrology for evaluation.  I am not sure there is a lot of utility to opening this artery in the absence of uncontrolled HTN or bilateral stenosis.   7.  Celiac artery stenosis: Severe celiac stenosis on abdominal dopplers.  He does not describe abdominal angina. Continue to follow.  8. Paroxysmal atrial fibrillation: Patient is on warfarin.  He is in NSR today.  9. CKD: Last creatinine was 2.2.  This has been trending up slowly.  Patient has followup with nephrology (Dr. Joelyn Oms).  BMET today.  10.  Hip and leg pain: ABIs normal.  I suspect that this is sciatica.  Sean [name] 01/07/2014

## 2014-01-08 LAB — CBC WITH DIFFERENTIAL/PLATELET
BASOS ABS: 0 10*3/uL (ref 0.0–0.1)
Basophils Relative: 0.4 % (ref 0.0–3.0)
EOS PCT: 7.1 % — AB (ref 0.0–5.0)
Eosinophils Absolute: 0.4 10*3/uL (ref 0.0–0.7)
HEMATOCRIT: 41.2 % (ref 39.0–52.0)
Hemoglobin: 13.3 g/dL (ref 13.0–17.0)
LYMPHS PCT: 27.1 % (ref 12.0–46.0)
Lymphs Abs: 1.4 10*3/uL (ref 0.7–4.0)
MCHC: 32.4 g/dL (ref 30.0–36.0)
MCV: 88.1 fl (ref 78.0–100.0)
Monocytes Absolute: 0.9 10*3/uL (ref 0.1–1.0)
NEUTROS PCT: 47.2 % (ref 43.0–77.0)
Neutro Abs: 2.4 10*3/uL (ref 1.4–7.7)
PLATELETS: 137 10*3/uL — AB (ref 150.0–400.0)
RBC: 4.68 Mil/uL (ref 4.22–5.81)
RDW: 15.1 % (ref 11.5–15.5)
WBC: 5.1 10*3/uL (ref 4.0–10.5)

## 2014-01-08 LAB — BASIC METABOLIC PANEL
BUN: 36 mg/dL — ABNORMAL HIGH (ref 6–23)
CO2: 27 mEq/L (ref 19–32)
CREATININE: 2.3 mg/dL — AB (ref 0.4–1.5)
Calcium: 9.5 mg/dL (ref 8.4–10.5)
Chloride: 100 mEq/L (ref 96–112)
GFR: 29.17 mL/min — ABNORMAL LOW (ref >60.00)
Glucose, Bld: 102 mg/dL — ABNORMAL HIGH (ref 70–99)
POTASSIUM: 4 meq/L (ref 3.5–5.1)
Sodium: 136 mEq/L (ref 135–145)

## 2014-01-11 ENCOUNTER — Other Ambulatory Visit: Payer: Self-pay | Admitting: Cardiology

## 2014-01-12 ENCOUNTER — Encounter (HOSPITAL_COMMUNITY): Payer: Self-pay | Admitting: Cardiology

## 2014-01-16 ENCOUNTER — Ambulatory Visit (INDEPENDENT_AMBULATORY_CARE_PROVIDER_SITE_OTHER): Payer: Medicare Other | Admitting: *Deleted

## 2014-01-16 ENCOUNTER — Encounter: Payer: Self-pay | Admitting: Internal Medicine

## 2014-01-16 DIAGNOSIS — I5022 Chronic systolic (congestive) heart failure: Secondary | ICD-10-CM

## 2014-01-16 DIAGNOSIS — I255 Ischemic cardiomyopathy: Secondary | ICD-10-CM

## 2014-01-16 LAB — MDC_IDC_ENUM_SESS_TYPE_REMOTE
Battery Remaining Longevity: 60 mo
Battery Remaining Percentage: 89 %
Battery Voltage: 3.08 V
Brady Statistic AS VP Percent: 62 %
Brady Statistic AS VS Percent: 1 %
Brady Statistic RA Percent Paced: 36 %
HighPow Impedance: 46 Ohm
HighPow Impedance: 46 Ohm
Implantable Pulse Generator Serial Number: 7183458
Lead Channel Impedance Value: 340 Ohm
Lead Channel Impedance Value: 400 Ohm
Lead Channel Pacing Threshold Amplitude: 0.5 V
Lead Channel Pacing Threshold Amplitude: 0.75 V
Lead Channel Pacing Threshold Pulse Width: 0.5 ms
Lead Channel Pacing Threshold Pulse Width: 0.5 ms
Lead Channel Pacing Threshold Pulse Width: 1.2 ms
Lead Channel Sensing Intrinsic Amplitude: 2.2 mV
Lead Channel Setting Pacing Amplitude: 2.5 V
Lead Channel Setting Sensing Sensitivity: 2 mV
MDC IDC MSMT LEADCHNL LV PACING THRESHOLD AMPLITUDE: 3.5 V
MDC IDC MSMT LEADCHNL RV IMPEDANCE VALUE: 510 Ohm
MDC IDC MSMT LEADCHNL RV SENSING INTR AMPL: 12 mV
MDC IDC SESS DTM: 20151214070018
MDC IDC SET LEADCHNL LV PACING PULSEWIDTH: 1.2 ms
MDC IDC SET LEADCHNL RA PACING AMPLITUDE: 2 V
MDC IDC SET LEADCHNL RV PACING AMPLITUDE: 2.5 V
MDC IDC SET LEADCHNL RV PACING PULSEWIDTH: 0.5 ms
MDC IDC SET ZONE DETECTION INTERVAL: 250 ms
MDC IDC SET ZONE DETECTION INTERVAL: 350 ms
MDC IDC STAT BRADY AP VP PERCENT: 37 %
MDC IDC STAT BRADY AP VS PERCENT: 1 %
Zone Setting Detection Interval: 280 ms

## 2014-01-16 NOTE — Progress Notes (Signed)
Remote ICD transmission.   

## 2014-01-23 ENCOUNTER — Telehealth: Payer: Self-pay | Admitting: Cardiology

## 2014-01-23 NOTE — Telephone Encounter (Signed)
Received request from Nurse fax box, documents faxed for surgical clearance. To: [facility] and [employer]  Fax number: [phone] Attention: 12.21.15/km

## 2014-02-03 HISTORY — PX: CATARACT EXTRACTION: SUR2

## 2014-02-08 ENCOUNTER — Other Ambulatory Visit: Payer: Self-pay | Admitting: Internal Medicine

## 2014-02-09 ENCOUNTER — Encounter: Payer: Self-pay | Admitting: Cardiology

## 2014-02-17 ENCOUNTER — Ambulatory Visit (INDEPENDENT_AMBULATORY_CARE_PROVIDER_SITE_OTHER): Payer: Medicare Other | Admitting: *Deleted

## 2014-02-17 DIAGNOSIS — I059 Rheumatic mitral valve disease, unspecified: Secondary | ICD-10-CM

## 2014-02-17 DIAGNOSIS — I480 Paroxysmal atrial fibrillation: Secondary | ICD-10-CM

## 2014-02-17 DIAGNOSIS — Z7901 Long term (current) use of anticoagulants: Secondary | ICD-10-CM

## 2014-02-17 DIAGNOSIS — Z5181 Encounter for therapeutic drug level monitoring: Secondary | ICD-10-CM

## 2014-02-17 DIAGNOSIS — I4891 Unspecified atrial fibrillation: Secondary | ICD-10-CM

## 2014-02-17 LAB — POCT INR: INR: 2.8

## 2014-02-26 ENCOUNTER — Other Ambulatory Visit: Payer: Self-pay | Admitting: Family Medicine

## 2014-03-02 ENCOUNTER — Other Ambulatory Visit (INDEPENDENT_AMBULATORY_CARE_PROVIDER_SITE_OTHER): Payer: Medicare Other | Admitting: *Deleted

## 2014-03-02 DIAGNOSIS — E785 Hyperlipidemia, unspecified: Secondary | ICD-10-CM

## 2014-03-02 DIAGNOSIS — I5022 Chronic systolic (congestive) heart failure: Secondary | ICD-10-CM

## 2014-03-02 LAB — LDL CHOLESTEROL, DIRECT: Direct LDL: 88 mg/dL

## 2014-03-02 LAB — LIPID PANEL
CHOLESTEROL: 163 mg/dL (ref 0–200)
HDL: 31.8 mg/dL — ABNORMAL LOW (ref >39.00)
NONHDL: 131.2
TRIGLYCERIDES: 251 mg/dL — AB (ref 0.0–149.0)
Total CHOL/HDL Ratio: 5
VLDL: 50.2 mg/dL — AB (ref 0.0–40.0)

## 2014-03-02 LAB — HEPATIC FUNCTION PANEL
ALBUMIN: 4.2 g/dL (ref 3.5–5.2)
ALT: 14 U/L (ref 0–53)
AST: 23 U/L (ref 0–37)
Alkaline Phosphatase: 30 U/L — ABNORMAL LOW (ref 39–117)
BILIRUBIN DIRECT: 0.2 mg/dL (ref 0.0–0.3)
TOTAL PROTEIN: 7.5 g/dL (ref 6.0–8.3)
Total Bilirubin: 0.9 mg/dL (ref 0.2–1.2)

## 2014-03-08 ENCOUNTER — Other Ambulatory Visit: Payer: Self-pay | Admitting: Internal Medicine

## 2014-03-08 ENCOUNTER — Other Ambulatory Visit: Payer: Self-pay | Admitting: Cardiology

## 2014-03-14 ENCOUNTER — Other Ambulatory Visit: Payer: Self-pay | Admitting: Cardiology

## 2014-03-21 ENCOUNTER — Other Ambulatory Visit: Payer: Self-pay | Admitting: Cardiology

## 2014-03-27 ENCOUNTER — Other Ambulatory Visit: Payer: Self-pay | Admitting: Family Medicine

## 2014-03-31 ENCOUNTER — Ambulatory Visit (INDEPENDENT_AMBULATORY_CARE_PROVIDER_SITE_OTHER): Payer: Medicare Other

## 2014-03-31 DIAGNOSIS — I059 Rheumatic mitral valve disease, unspecified: Secondary | ICD-10-CM

## 2014-03-31 DIAGNOSIS — I4891 Unspecified atrial fibrillation: Secondary | ICD-10-CM

## 2014-03-31 DIAGNOSIS — Z7901 Long term (current) use of anticoagulants: Secondary | ICD-10-CM

## 2014-03-31 DIAGNOSIS — Z5181 Encounter for therapeutic drug level monitoring: Secondary | ICD-10-CM

## 2014-03-31 LAB — POCT INR: INR: 2.1

## 2014-04-19 ENCOUNTER — Encounter: Payer: Self-pay | Admitting: Internal Medicine

## 2014-04-19 ENCOUNTER — Ambulatory Visit (INDEPENDENT_AMBULATORY_CARE_PROVIDER_SITE_OTHER): Payer: Medicare Other | Admitting: *Deleted

## 2014-04-19 DIAGNOSIS — I5022 Chronic systolic (congestive) heart failure: Secondary | ICD-10-CM

## 2014-04-19 DIAGNOSIS — I255 Ischemic cardiomyopathy: Secondary | ICD-10-CM

## 2014-04-19 LAB — MDC_IDC_ENUM_SESS_TYPE_REMOTE
Battery Remaining Percentage: 85 %
Brady Statistic AP VS Percent: 1 %
Brady Statistic AS VS Percent: 1 %
Date Time Interrogation Session: 20160316135041
HIGH POWER IMPEDANCE MEASURED VALUE: 47 Ohm
HighPow Impedance: 47 Ohm
Implantable Pulse Generator Serial Number: 7183458
Lead Channel Impedance Value: 400 Ohm
Lead Channel Impedance Value: 490 Ohm
Lead Channel Pacing Threshold Amplitude: 0.5 V
Lead Channel Pacing Threshold Amplitude: 3.5 V
Lead Channel Pacing Threshold Pulse Width: 0.5 ms
Lead Channel Pacing Threshold Pulse Width: 1.2 ms
Lead Channel Sensing Intrinsic Amplitude: 12 mV
Lead Channel Sensing Intrinsic Amplitude: 3.1 mV
Lead Channel Setting Pacing Amplitude: 2.5 V
Lead Channel Setting Pacing Pulse Width: 0.5 ms
Lead Channel Setting Pacing Pulse Width: 1.2 ms
Lead Channel Setting Sensing Sensitivity: 2 mV
MDC IDC MSMT BATTERY REMAINING LONGEVITY: 56 mo
MDC IDC MSMT BATTERY VOLTAGE: 3.01 V
MDC IDC MSMT LEADCHNL LV IMPEDANCE VALUE: 310 Ohm
MDC IDC MSMT LEADCHNL RA PACING THRESHOLD AMPLITUDE: 0.75 V
MDC IDC MSMT LEADCHNL RV PACING THRESHOLD PULSEWIDTH: 0.5 ms
MDC IDC SET LEADCHNL LV PACING AMPLITUDE: 2.5 V
MDC IDC SET LEADCHNL RA PACING AMPLITUDE: 2 V
MDC IDC STAT BRADY AP VP PERCENT: 33 %
MDC IDC STAT BRADY AS VP PERCENT: 65 %
MDC IDC STAT BRADY RA PERCENT PACED: 32 %
Zone Setting Detection Interval: 250 ms
Zone Setting Detection Interval: 280 ms
Zone Setting Detection Interval: 350 ms

## 2014-04-19 NOTE — Progress Notes (Signed)
Remote ICD transmission.   

## 2014-04-24 ENCOUNTER — Encounter: Payer: Self-pay | Admitting: *Deleted

## 2014-04-24 ENCOUNTER — Other Ambulatory Visit: Payer: Self-pay | Admitting: Family Medicine

## 2014-04-24 ENCOUNTER — Ambulatory Visit: Payer: Medicare Other | Admitting: Cardiology

## 2014-04-24 ENCOUNTER — Other Ambulatory Visit: Payer: Self-pay | Admitting: Cardiology

## 2014-04-24 ENCOUNTER — Ambulatory Visit (INDEPENDENT_AMBULATORY_CARE_PROVIDER_SITE_OTHER): Payer: Medicare Other | Admitting: Cardiology

## 2014-04-24 ENCOUNTER — Encounter: Payer: Self-pay | Admitting: Cardiology

## 2014-04-24 VITALS — BP 110/46 | HR 65 | Ht 73.0 in | Wt 193.0 lb

## 2014-04-24 DIAGNOSIS — I6523 Occlusion and stenosis of bilateral carotid arteries: Secondary | ICD-10-CM | POA: Diagnosis not present

## 2014-04-24 DIAGNOSIS — I255 Ischemic cardiomyopathy: Secondary | ICD-10-CM

## 2014-04-24 DIAGNOSIS — I25810 Atherosclerosis of coronary artery bypass graft(s) without angina pectoris: Secondary | ICD-10-CM

## 2014-04-24 DIAGNOSIS — E785 Hyperlipidemia, unspecified: Secondary | ICD-10-CM

## 2014-04-24 DIAGNOSIS — N183 Chronic kidney disease, stage 3 unspecified: Secondary | ICD-10-CM

## 2014-04-24 DIAGNOSIS — I5022 Chronic systolic (congestive) heart failure: Secondary | ICD-10-CM

## 2014-04-24 LAB — BASIC METABOLIC PANEL
BUN: 29 mg/dL — ABNORMAL HIGH (ref 6–23)
CO2: 30 meq/L (ref 19–32)
Calcium: 9.7 mg/dL (ref 8.4–10.5)
Chloride: 100 mEq/L (ref 96–112)
Creatinine, Ser: 2.28 mg/dL — ABNORMAL HIGH (ref 0.40–1.50)
GFR: 29.45 mL/min — AB (ref >60.00)
Glucose, Bld: 109 mg/dL — ABNORMAL HIGH (ref 70–99)
POTASSIUM: 3.9 meq/L (ref 3.5–5.1)
SODIUM: 136 meq/L (ref 135–145)

## 2014-04-24 MED ORDER — RANOLAZINE ER 1000 MG PO TB12: 1000.0000 mg | ORAL_TABLET | Freq: Two times a day (BID) | ORAL | Status: DC

## 2014-04-24 NOTE — Patient Instructions (Addendum)
Increase Ranexa to 1000mg  two times a day. You can take 2 of your 500mg  tablets two times a day and use your current supply.   Your physician recommends that you have lab work today--BMET.   Your physician has requested that you have a carotid duplex. This test is an ultrasound of the carotid arteries in your neck. It looks at blood flow through these arteries that supply the brain with blood. Allow one hour for this exam. There are no restrictions or special instructions. MAY 2016   Your physician has requested that you have an echocardiogram. Echocardiography is a painless test that uses sound waves to create images of your heart. It provides your doctor with information about the size and shape of your heart and how well your heart's chambers and valves are working. This procedure takes approximately one hour. There are no restrictions for this procedure. MAY 2016  Your physician wants you to follow-up in: 4 months with Dr Aundra Dubin. (July 2016). You will receive a reminder letter in the mail two months in advance. If you don't receive a letter, please call our office to schedule the follow-up appointment.

## 2014-04-24 NOTE — Progress Notes (Signed)
Patient ID: Sean [name], male   DOB: 11-Nov-1931, 81 y.o.   MRN: [id number] PCP: Dr. Elease Hashimoto  81 yo with history of CAD s/p CABG, ischemic cardiomyopathy, CKD, and paroxysmal atrial fibrillation presents for cardiology followup.  He walks with his wife most days.  He has a stable chest pain pattern walking up hill or walking fast. This resolves with rest.  He takes NTG 1-2 times/month.  This is improved since beginning on ranolazine.  He is not short of breath walking slowly on flat ground.  He is short of breath with steps. No significant post-prandial abdominal pain (has mesenteric artery stenosis). He still has bilateral hip pain that radiates down the back of his legs.  ABIs were normal and this pain is thought to be sciatica. Weight is down 9 lbs since last appointment, he has adjusted his diet.  Labs (7/13): LDL 77, HDL 34 Labs (4/14): K 4.3, creatinine 2.0 Labs (5/14): K 4.2, creatinine 1.78 Labs (6/14): LDL 26, HDL 83 Labs (9/14): K 3.6, creatinine 2.0, BNP 308 Labs (6/15): K 3.9, creatinine 2.2, HCT 38.2, LDL 74, HDL 31 Labs (12/15): K 4, creatinine 2.3 Labs (1/16): LDL 88,  HDL 32, TGs 251  PMH; 1. History of DVT 2. HTN: ACEI cough. 3. Hyperlipidemia 4. CAD s/p CABG in 1991 with redo in 1997.  [place] 2013 with SVG-OM patent and LIMA-LAD patent, other 3 SVGs closed.  Chronic stable angina.  5. Ischemic cardiomyopathy: Echo in 4/13 with dilated LV, EF 30%, mildly dilated RV with mildly decreased RV function, moderate MR, mild AI.  - St Jude ICD with CRT upgrade in 9/10.  6. Carotid stenosis: 5/14 carotid dopplers with 40-59% bilateral ICA stenosis and occluded right vertebral.  5/15 carotid dopplers with 40-59% bilateral ICA stenosis.  7. GERD 8. CKD 9. OA 10. Paroxysmal atrial fibrillation 11. Mitral regurgitation: Moderate on last echo. 12. VT: s/p ablation in 5/13.  13. H/o diverticulitis 14. H/o cholecystectomy 15. H/o appendectomy 16. Renal artery stenosis: Renal artery  dopplers (5/14) with 60-99% proximal right renal artery stenosis as well as severe celiac artery stenosis.  17. ABIs (12/14) were normal. 18. Low back pain with sciatica  SH: Married, lives in [place], 2 kids, quit smoking in 1974.   FH: Mother, sister, and brother with CAD.  ROS: All systems reviewed and negative except as per HPI.   Current Outpatient Prescriptions  Medication Sig Dispense Refill  . atorvastatin (LIPITOR) 20 MG tablet Take 1 tablet (20 mg total) by mouth daily. 30 tablet 3  . carvedilol (COREG) 25 MG tablet TAKE 1 TABLET BY MOUTH TWICE DAILY 180 tablet 0  . diphenhydramine-acetaminophen (TYLENOL PM) 25-500 MG TABS Take 1 tablet by mouth at bedtime as needed (sleep).     . docusate sodium (COLACE) 100 MG capsule Take 100 mg by mouth daily as needed. For constipation    . fenofibrate 160 MG tablet TAKE 1 TABLET BY MOUTH EVERY DAY 60 tablet 0  . furosemide (LASIX) 40 MG tablet TAKE 2 TABLETS BY MOUTH EVERY MORNING AND 1 TABLET BY MOUTH EVERY AFTERNOON 90 tablet 3  . isosorbide mononitrate (IMDUR) 30 MG 24 hr tablet Take 1 tablet by mouth daily. With 60mg  tablet to equal 90mg  daily  6  . isosorbide mononitrate (IMDUR) 60 MG 24 hr tablet TAKE 1 TABLET BY MOUTH EVERY DAY (Patient taking differently: TAKE 1 TABLET BY MOUTH EVERY DAY with 30mg  tablet to equal 90mg  daily by mouth) 90 tablet 1  . losartan (  COZAAR) 50 MG tablet TAKE 1 TABLET BY MOUTH EVERY DAY 30 tablet 3  . NITROSTAT 0.4 MG SL tablet PLACE 1 TABLET UNDER THE TONGUE EVERY 5 MONUTES AS NEEDED FOR CHEST PAIN 25 tablet 0  . omeprazole (PRILOSEC) 20 MG capsule TAKE 1 CAPSULE BY MOUTH TWICE DAILY 180 capsule 1  . potassium chloride SA (K-DUR,KLOR-CON) 20 MEQ tablet TAKE 1 TABLET BY MOUTH TWICE DAILY 60 tablet 2  . Tamsulosin HCl (FLOMAX) 0.4 MG CAPS Take 0.4 mg by mouth daily.     Marland Kitchen triamcinolone cream (KENALOG) 0.1 % Apply 1 application topically as needed.    . vitamin B-12 (CYANOCOBALAMIN) 500 MCG tablet Take 500  mcg by mouth daily.      Marland Kitchen warfarin (COUMADIN) 5 MG tablet Take as directed by coumadin clinic 30 tablet 3  . ranolazine (RANEXA) 1000 MG SR tablet Take 1 tablet (1,000 mg total) by mouth 2 (two) times daily. 180 tablet 3  . [DISCONTINUED] triamcinolone (NASACORT AQ) 55 MCG/ACT nasal inhaler Place 2 sprays into the nose as needed.       No current facility-administered medications for this visit.   BP 110/46 mmHg  Pulse 65  Ht 6\' 1"  (1.854 m)  Wt 193 lb (87.544 kg)  BMI 25.47 kg/m2  SpO2 95% General: NAD Neck: No JVD, no thyromegaly or thyroid nodule.  Lungs: Clear to auscultation bilaterally with normal respiratory effort. CV: Nondisplaced PMI.  Heart regular S1/S2, no S3/S4, 2/6 HSM apex.  No peripheral edema.  R carotid bruit.  1+ PT bilaterally.  Abdomen: Soft, nontender, no hepatosplenomegaly, no distention.   Neurologic: Alert and oriented x 3.  Psych: Normal affect. Extremities: No clubbing or cyanosis.   Assessment/Plan:  1. CAD: s/p CABG.  Chronic stable angina, improved on ranolazine.   - Continue ASA 81, Imdur, ARB, statin, and Coreg.   - Given occasional residual angina, I will increase ranolazine to 1000 mg bid.  2. Chronic systolic CHF:  Ischemic cardiomyopathy.  EF 30% on last echo.  He does not appear volume overloaded on exam.  He has a [employer], [employer] CRT-D device.  - Continue current Lasix dosing (80 qam, 40 qpm).   - Given CKD, will not titrate losartan at this time or add spironolactone.   - Continue current Coreg (at goal dose).  - BMET today.  - I will arrange for echo to reassess LV systolic function when he gets his carotids done in May.  3. Hyperlipidemia: LDL acceptable on atorvastatin.  Triglycerides high, he is on fenofibrate.   4. Mitral regurgitation: Moderate on last echo. Repeat echo in 5/16.  5. Carotid stenosis: Repeat carotid dopplers in 5/16.   6. Renal artery stenosis: Unilateral renal artery stenosis.  BP is controlled.  I do not think there is a lot  of utility to opening this artery in the absence of uncontrolled HTN or bilateral stenosis.   7. Celiac artery stenosis: Severe celiac stenosis on abdominal dopplers.  He does not describe abdominal angina. Continue to follow.  8. Paroxysmal atrial fibrillation: Patient is on warfarin.  He is in NSR today.  9. CKD: Last creatinine was 2.3.  This has been trending up slowly.  Patient has followup with nephrology (Dr. Joelyn Oms).  BMET today.  10.  Hip and leg pain: ABIs normal.  I suspect that this is sciatica.  [name] 04/24/2014

## 2014-05-09 ENCOUNTER — Encounter: Payer: Self-pay | Admitting: Cardiology

## 2014-05-12 ENCOUNTER — Ambulatory Visit (INDEPENDENT_AMBULATORY_CARE_PROVIDER_SITE_OTHER): Payer: Medicare Other | Admitting: *Deleted

## 2014-05-12 DIAGNOSIS — Z5181 Encounter for therapeutic drug level monitoring: Secondary | ICD-10-CM | POA: Diagnosis not present

## 2014-05-12 DIAGNOSIS — I059 Rheumatic mitral valve disease, unspecified: Secondary | ICD-10-CM

## 2014-05-12 DIAGNOSIS — I4891 Unspecified atrial fibrillation: Secondary | ICD-10-CM

## 2014-05-12 DIAGNOSIS — Z7901 Long term (current) use of anticoagulants: Secondary | ICD-10-CM

## 2014-05-12 LAB — POCT INR: INR: 2.3

## 2014-05-26 ENCOUNTER — Other Ambulatory Visit: Payer: Self-pay | Admitting: Internal Medicine

## 2014-06-05 ENCOUNTER — Other Ambulatory Visit: Payer: Self-pay | Admitting: Internal Medicine

## 2014-06-10 ENCOUNTER — Other Ambulatory Visit: Payer: Self-pay | Admitting: Cardiology

## 2014-06-12 ENCOUNTER — Ambulatory Visit (HOSPITAL_BASED_OUTPATIENT_CLINIC_OR_DEPARTMENT_OTHER): Payer: Medicare Other

## 2014-06-12 ENCOUNTER — Other Ambulatory Visit: Payer: Self-pay

## 2014-06-12 ENCOUNTER — Encounter (HOSPITAL_COMMUNITY): Payer: Medicare Other

## 2014-06-12 ENCOUNTER — Ambulatory Visit (HOSPITAL_COMMUNITY): Payer: Medicare Other | Attending: Cardiovascular Disease

## 2014-06-12 DIAGNOSIS — I5022 Chronic systolic (congestive) heart failure: Secondary | ICD-10-CM

## 2014-06-12 DIAGNOSIS — Z87891 Personal history of nicotine dependence: Secondary | ICD-10-CM | POA: Insufficient documentation

## 2014-06-12 DIAGNOSIS — E785 Hyperlipidemia, unspecified: Secondary | ICD-10-CM | POA: Insufficient documentation

## 2014-06-12 DIAGNOSIS — I10 Essential (primary) hypertension: Secondary | ICD-10-CM | POA: Diagnosis not present

## 2014-06-12 DIAGNOSIS — I6523 Occlusion and stenosis of bilateral carotid arteries: Secondary | ICD-10-CM

## 2014-06-16 ENCOUNTER — Other Ambulatory Visit: Payer: Self-pay | Admitting: Cardiology

## 2014-06-18 ENCOUNTER — Other Ambulatory Visit: Payer: Self-pay | Admitting: Cardiology

## 2014-06-20 ENCOUNTER — Encounter: Payer: Self-pay | Admitting: Family Medicine

## 2014-06-21 ENCOUNTER — Encounter: Payer: Self-pay | Admitting: *Deleted

## 2014-06-23 ENCOUNTER — Ambulatory Visit (INDEPENDENT_AMBULATORY_CARE_PROVIDER_SITE_OTHER): Payer: Medicare Other

## 2014-06-23 DIAGNOSIS — Z7901 Long term (current) use of anticoagulants: Secondary | ICD-10-CM | POA: Diagnosis not present

## 2014-06-23 DIAGNOSIS — I059 Rheumatic mitral valve disease, unspecified: Secondary | ICD-10-CM

## 2014-06-23 DIAGNOSIS — Z5181 Encounter for therapeutic drug level monitoring: Secondary | ICD-10-CM | POA: Diagnosis not present

## 2014-06-23 DIAGNOSIS — I4891 Unspecified atrial fibrillation: Secondary | ICD-10-CM

## 2014-06-23 LAB — POCT INR: INR: 2.6

## 2014-07-13 ENCOUNTER — Other Ambulatory Visit: Payer: Self-pay | Admitting: Cardiology

## 2014-07-14 ENCOUNTER — Other Ambulatory Visit: Payer: Self-pay | Admitting: Cardiology

## 2014-08-02 ENCOUNTER — Ambulatory Visit (INDEPENDENT_AMBULATORY_CARE_PROVIDER_SITE_OTHER): Payer: Medicare Other | Admitting: Internal Medicine

## 2014-08-02 ENCOUNTER — Encounter: Payer: Self-pay | Admitting: Internal Medicine

## 2014-08-02 VITALS — BP 136/52 | HR 69 | Ht 73.0 in | Wt 193.0 lb

## 2014-08-02 DIAGNOSIS — I255 Ischemic cardiomyopathy: Secondary | ICD-10-CM

## 2014-08-02 DIAGNOSIS — I5022 Chronic systolic (congestive) heart failure: Secondary | ICD-10-CM | POA: Diagnosis not present

## 2014-08-02 DIAGNOSIS — I472 Ventricular tachycardia: Secondary | ICD-10-CM

## 2014-08-02 DIAGNOSIS — Z4502 Encounter for adjustment and management of automatic implantable cardiac defibrillator: Secondary | ICD-10-CM | POA: Diagnosis not present

## 2014-08-02 DIAGNOSIS — Z95810 Presence of automatic (implantable) cardiac defibrillator: Secondary | ICD-10-CM | POA: Diagnosis not present

## 2014-08-02 LAB — CUP PACEART INCLINIC DEVICE CHECK
Battery Remaining Longevity: 56.4 mo
Brady Statistic RV Percent Paced: 99 %
Date Time Interrogation Session: 20160629170655
HIGH POWER IMPEDANCE MEASURED VALUE: 45.0898
Lead Channel Impedance Value: 587.5 Ohm
Lead Channel Pacing Threshold Amplitude: 0.75 V
Lead Channel Pacing Threshold Pulse Width: 0.5 ms
Lead Channel Pacing Threshold Pulse Width: 0.5 ms
Lead Channel Pacing Threshold Pulse Width: 1 ms
Lead Channel Sensing Intrinsic Amplitude: 11.3 mV
Lead Channel Setting Pacing Amplitude: 2 V
Lead Channel Setting Pacing Amplitude: 2.5 V
Lead Channel Setting Pacing Amplitude: 2.5 V
Lead Channel Setting Pacing Pulse Width: 0.5 ms
MDC IDC MSMT LEADCHNL LV IMPEDANCE VALUE: 312.5 Ohm
MDC IDC MSMT LEADCHNL LV PACING THRESHOLD AMPLITUDE: 1.5 V
MDC IDC MSMT LEADCHNL RA IMPEDANCE VALUE: 412.5 Ohm
MDC IDC MSMT LEADCHNL RA PACING THRESHOLD AMPLITUDE: 1 V
MDC IDC MSMT LEADCHNL RA SENSING INTR AMPL: 3.4 mV
MDC IDC PG SERIAL: 7183458
MDC IDC SET LEADCHNL LV PACING PULSEWIDTH: 1 ms
MDC IDC SET LEADCHNL RV SENSING SENSITIVITY: 2 mV
MDC IDC SET ZONE DETECTION INTERVAL: 350 ms
MDC IDC STAT BRADY RA PERCENT PACED: 28 %
Zone Setting Detection Interval: 250 ms
Zone Setting Detection Interval: 280 ms

## 2014-08-02 NOTE — Progress Notes (Signed)
Patient Care Team: Eulas Post, MD as PCP - General   HPI  Sean [name] is a 81 y.o. male  followup for ventricular tachycardia in the setting of ischemic heart disease with prior CABG and a prior ICD implantation.   He underwent CRT upgrade in September 2010  He  developed ventricular tachycardia for which he was then ultimately submitted to catheter ablation by Dr. Lovena Le. May 2013. Targeted areas were   left ventricular septum. He was rendered noninducible.   Echo EF 2013  30%  Cath Final Conclusions 2013 :  1. Continued patency of the LIMA to the LAD diagonal  2. Continued patency of the stent OM2 graft  3. Known occlusion of the three remaining SVG  4. Known occlusion of the RCA       He he has some mild edema. He has complaints of exercise intolerance manifested both by chest discomfort leg weakness and shortness of breath.  Efforts in the past increase his nitrates were complicated by hypotension Past Medical History  Diagnosis Date  . HYPERLIPIDEMIA 04/07/2008  . HYPERTENSION 04/07/2008  . CAD, ARTERY BYPASS GRAFT 05/22/2008  . CARDIOMYOPATHY, ISCHEMIC 05/22/2008  . MITRAL VALVE PROLAPSE, NON-RHEUMATIC 05/22/2008  . AV BLOCK 05/22/2008  . Chronic systolic heart failure 3/76/2831  . CAROTID ARTERY STENOSIS 11/15/2009  . GERD 04/07/2008  . RENAL INSUFFICIENCY 10/17/2008  . DEGENERATIVE JOINT DISEASE, LEFT KNEE 04/07/2008  . COLONIC POLYPS, HX OF 04/07/2008  . DIVERTICULITIS, HX OF 04/07/2008  . Atrial fibrillation 04/04/2010  . Decreased left ventricular function   . Ventricular tachycardia     Status post catheter ablation may 2013  . CHF (congestive heart failure)     class 2 to 3    Past Surgical History  Procedure Laterality Date  . Cardiac surgery      open heart 1991, 1997, pacemaker insertion [place] 2010 CD 3231  . Lung surgery  1987  . Hernia repair  1952    2004  . Cholecystectomy    . Appendectomy    . Thoracotomy      secondary to lung mass  .  Insert / replace / remove pacemaker      St Jude unify CD 3231/2010  . Colonoscopy N/A 08/26/2013    Procedure: COLONOSCOPY;  Surgeon: Beryle Beams, MD;  Location: [facility];  Service: Endoscopy;  Laterality: N/A;  . Left heart catheterization with coronary/graft angiogram N/A 04/30/2011    Procedure: LEFT HEART CATHETERIZATION WITH Beatrix Fetters;  Surgeon: Hillary Bow, MD;  Location: [facility] CATH LAB;  Service: Cardiovascular;  Laterality: N/A;  . V-tach ablation N/A 06/26/2011    Procedure: V-TACH ABLATION;  Surgeon: Thompson Grayer, MD;  Location: [facility] CATH LAB;  Service: Cardiovascular;  Laterality: N/A;  . Implantable cardioverter defibrillator generator change N/A 07/15/2013    Procedure: IMPLANTABLE CARDIOVERTER DEFIBRILLATOR GENERATOR CHANGE;  Surgeon: Deboraha Sprang, MD;  Location: [facility] CATH LAB;  Service: Cardiovascular;  Laterality: N/A;    Current Outpatient Prescriptions  Medication Sig Dispense Refill  . atorvastatin (LIPITOR) 20 MG tablet TAKE 1 TABLET BY MOUTH EVERY DAY 30 tablet 11  . carvedilol (COREG) 25 MG tablet TAKE 1 TABLET BY MOUTH TWICE DAILY 180 tablet 3  . diphenhydramine-acetaminophen (TYLENOL PM) 25-500 MG TABS Take 1 tablet by mouth at bedtime as needed (sleep).     . docusate sodium (COLACE) 100 MG capsule Take 100 mg by mouth daily as needed. For constipation    . fenofibrate 160 MG  tablet TAKE 1 TABLET BY MOUTH EVERY DAY 60 tablet 3  . furosemide (LASIX) 40 MG tablet TAKE TWO TABLETS BY MOUTH EVERY MORNING AND ONE TABLET EVERY AFTERNOON 270 tablet 0  . isosorbide mononitrate (IMDUR) 30 MG 24 hr tablet Take 1 tablet by mouth daily. With 60mg  tablet to equal 90mg  daily  6  . isosorbide mononitrate (IMDUR) 60 MG 24 hr tablet TAKE 1 TABLET BY MOUTH EVERY DAY 90 tablet 1  . losartan (COZAAR) 50 MG tablet TAKE 1 TABLET BY MOUTH EVERY DAY 90 tablet 0  . NITROSTAT 0.4 MG SL tablet PLACE 1 TABLET UNDER THE TONGUE EVERY 5 MINUTES AS NEEDED FOR CHEST PAIN 25 tablet 0   . omeprazole (PRILOSEC) 20 MG capsule TAKE 1 CAPSULE BY MOUTH TWICE DAILY 180 capsule 1  . potassium chloride SA (K-DUR,KLOR-CON) 20 MEQ tablet TAKE 1 TABLET BY MOUTH TWICE DAILY 60 tablet 2  . ranolazine (RANEXA) 1000 MG SR tablet Take 1 tablet (1,000 mg total) by mouth 2 (two) times daily. 180 tablet 3  . Tamsulosin HCl (FLOMAX) 0.4 MG CAPS Take 0.4 mg by mouth daily.     Marland Kitchen triamcinolone cream (KENALOG) 0.1 % Apply 1 application topically as needed (for rash).     . vitamin B-12 (CYANOCOBALAMIN) 500 MCG tablet Take 500 mcg by mouth daily.      Marland Kitchen warfarin (COUMADIN) 5 MG tablet TAKE AS DIRECTED BY COUMADIN CLINIC 30 tablet 3  . [DISCONTINUED] triamcinolone (NASACORT AQ) 55 MCG/ACT nasal inhaler Place 2 sprays into the nose as needed.       No current facility-administered medications for this visit.    Allergies  Allergen Reactions  . Carisoprodol     REACTION: rash  . Lisinopril     REACTION: cough  . Tape     Cloth Tape tears skin  . Zolpidem Tartrate Other (See Comments)    Altered mental status  . Penicillins Rash    Review of Systems negative except from HPI and PMH  Physical Exam BP 136/52 mmHg  Pulse 69  Ht 6\' 1"  (1.854 m)  Wt 193 lb (87.544 kg)  BMI 25.47 kg/m2 Well developed and well nourished in no acute distress HENT normal E scleral and icterus clear Neck Supple JVP flat; carotids brisk and full Clear to ausculation  Regular rate and rhythm, 2/6  Murmur apex  Soft   No clubbing cyanosis none Edema Alert and oriented, grossly normal motor and sensory function Skin Warm and Dry  ECG demonstrates P. Synchronous pacing with a negative QRS in lead V1  Assessment and  Plan  Ischemic cardiomyopathy  Complete heart block  Exertional chest discomfort    Congestive heart failure-chronic-systolic  CRT-D St Jude The patient's device was interrogated.  The information was reviewed. No changes were made in the programming.     Overall he is ischemic  symptoms are much improved on the ranolazine. His exercise tolerance is quite variable. It is not clear whether this relates to variability in effort on a day-to-day basis. We then spent a fair amount of time trying to reprogram his device to accomplish a more narrow QRS. This was accomplished by any 80 ms LV offset.  We will plan an AV optimization echo.  We spent more than 50% of our >55 min visit in face to face counseling regarding the above

## 2014-08-02 NOTE — Patient Instructions (Addendum)
Medication Instructions:  Your physician recommends that you continue on your current medications as directed. Please refer to the Current Medication list given to you today.  Labwork: None ordered  Testing/Procedures: Your physician has requested that you have an AV Optimization echocardiogram in 4 weeks.  This procedure takes approximately one hour. There are no restrictions for this procedure.  Follow-Up: Remote monitoring is used to monitor your Pacemaker of ICD from home. This monitoring reduces the number of office visits required to check your device to one time per year. It allows [place] to keep an eye on the functioning of your device to ensure it is working properly. You are scheduled for a device check from home on 11/01/14. You may send your transmission at any time that day. If you have a wireless device, the transmission will be sent automatically. After your physician reviews your transmission, you will receive a postcard with your next transmission date.  Your physician wants you to follow-up in: 1 year with Dr. Caryl Comes.  You will receive a reminder letter in the mail two months in advance. If you don't receive a letter, please call our office to schedule the follow-up appointment.  Thank you for choosing [facility] [place]!!

## 2014-08-10 ENCOUNTER — Other Ambulatory Visit: Payer: Self-pay | Admitting: Cardiology

## 2014-08-11 ENCOUNTER — Ambulatory Visit (INDEPENDENT_AMBULATORY_CARE_PROVIDER_SITE_OTHER): Payer: Medicare Other | Admitting: Nurse Practitioner

## 2014-08-11 ENCOUNTER — Ambulatory Visit (INDEPENDENT_AMBULATORY_CARE_PROVIDER_SITE_OTHER): Payer: Medicare Other | Admitting: *Deleted

## 2014-08-11 ENCOUNTER — Encounter: Payer: Self-pay | Admitting: Nurse Practitioner

## 2014-08-11 VITALS — BP 120/80 | HR 64 | Ht 73.0 in | Wt 190.4 lb

## 2014-08-11 DIAGNOSIS — I4891 Unspecified atrial fibrillation: Secondary | ICD-10-CM

## 2014-08-11 DIAGNOSIS — I059 Rheumatic mitral valve disease, unspecified: Secondary | ICD-10-CM | POA: Diagnosis not present

## 2014-08-11 DIAGNOSIS — I5022 Chronic systolic (congestive) heart failure: Secondary | ICD-10-CM | POA: Diagnosis not present

## 2014-08-11 DIAGNOSIS — Z7901 Long term (current) use of anticoagulants: Secondary | ICD-10-CM

## 2014-08-11 DIAGNOSIS — I25810 Atherosclerosis of coronary artery bypass graft(s) without angina pectoris: Secondary | ICD-10-CM

## 2014-08-11 DIAGNOSIS — I480 Paroxysmal atrial fibrillation: Secondary | ICD-10-CM

## 2014-08-11 DIAGNOSIS — Z5181 Encounter for therapeutic drug level monitoring: Secondary | ICD-10-CM

## 2014-08-11 DIAGNOSIS — Z95810 Presence of automatic (implantable) cardiac defibrillator: Secondary | ICD-10-CM

## 2014-08-11 DIAGNOSIS — I255 Ischemic cardiomyopathy: Secondary | ICD-10-CM | POA: Diagnosis not present

## 2014-08-11 LAB — CBC
HCT: 39.4 % (ref 39.0–52.0)
Hemoglobin: 13.1 g/dL (ref 13.0–17.0)
MCHC: 33.3 g/dL (ref 30.0–36.0)
MCV: 86.6 fl (ref 78.0–100.0)
Platelets: 143 10*3/uL — ABNORMAL LOW (ref 150.0–400.0)
RBC: 4.56 Mil/uL (ref 4.22–5.81)
RDW: 14.8 % (ref 11.5–15.5)
WBC: 5.3 10*3/uL (ref 4.0–10.5)

## 2014-08-11 LAB — BASIC METABOLIC PANEL
BUN: 29 mg/dL — ABNORMAL HIGH (ref 6–23)
CO2: 32 mEq/L (ref 19–32)
Calcium: 9.7 mg/dL (ref 8.4–10.5)
Chloride: 99 mEq/L (ref 96–112)
Creatinine, Ser: 2.27 mg/dL — ABNORMAL HIGH (ref 0.40–1.50)
GFR: 29.57 mL/min — ABNORMAL LOW (ref >60.00)
Glucose, Bld: 99 mg/dL (ref 70–99)
Potassium: 4 mEq/L (ref 3.5–5.1)
Sodium: 138 mEq/L (ref 135–145)

## 2014-08-11 LAB — POCT INR: INR: 3.4

## 2014-08-11 NOTE — Patient Instructions (Addendum)
We will be checking the following labs today - BMET & CBC   Medication Instructions:    Continue with your current medicines.     Testing/Procedures To Be Arranged:  Proceed with echo/AV optimization later this month as planned  Follow-Up:   See Dr. Aundra Dubin in 4 months    Other Special Instructions:   N/A  Call the [place] office at [phone] if you have any questions, problems or concerns.

## 2014-08-11 NOTE — Progress Notes (Signed)
CARDIOLOGY OFFICE NOTE  Date:  08/11/2014    Sean [name] Date of Birth: 04/29/1931 Medical Record #[id number]  PCP:  Sean Post, MD  Cardiologist:  Sean [name]    Chief Complaint  Patient presents with  . Cardiomyopathy    Follow up visit - seen for Sean [name] & Sean [name]    History of Present Illness: Sean [name] is a 81 y.o. male who presents today for a follow up visit. Seen for Sean [name] and Sean [name]. He has a history of CAD s/p CABG, ischemic cardiomyopathy, CKD, and paroxysmal atrial fibrillation. He has had ventricular tachycardia in the setting of ischemic heart disease, has an ICD in place and he underwent CRT upgrade in September 2010.  Hedeveloped ventricular tachycardia for which he was then ultimately submitted to catheter ablation by Sean [name] in May 2013. He was rendered noninducible.   Just seen by Sean [name] last week - set up for AV optimization due to some exercise intolerance.   Last seen by Sean [name] in March - noted that he had a stable chest pain pattern with walking up hill or walking fast. Resolves with rest. Taking NTG 1-2 times/month.   [name] in today. Here with his wife. He is not sure why he is here today. Looks like this was a follow up from Sean [name] last [place].  Was getting swimmy headed - but changed his Am meds to take after he ate breakfast and this has resolved. No swelling. Stable angina but notes he is using less NTG.   PMH; 1. History of DVT 2. HTN: ACEI cough. 3. Hyperlipidemia 4. CAD s/p CABG in 1991 with redo in 1997. [place] 2013 with SVG-OM patent and LIMA-LAD patent, other 3 SVGs closed. Chronic stable angina.  5. Ischemic cardiomyopathy: Echo in 4/13 with dilated LV, EF 30%, mildly dilated RV with mildly decreased RV function, moderate MR, mild AI.  - St Jude ICD with CRT upgrade in 9/10.  6. Carotid stenosis: 5/14 carotid dopplers with 40-59% bilateral ICA stenosis and occluded right vertebral. 5/15 carotid  dopplers with 40-59% bilateral ICA stenosis.  7. GERD 8. CKD 9. OA 10. Paroxysmal atrial fibrillation 11. Mitral regurgitation: Moderate on last echo. 12. VT: s/p ablation in 5/13.  13. H/o diverticulitis 14. H/o cholecystectomy 15. H/o appendectomy 16. Renal artery stenosis: Renal artery dopplers (5/14) with 60-99% proximal right renal artery stenosis as well as severe celiac artery stenosis.  17. ABIs (12/14) were normal. 18. Low back pain with sciatica  Past Medical History  Diagnosis Date  . HYPERLIPIDEMIA 04/07/2008  . HYPERTENSION 04/07/2008  . CAD, ARTERY BYPASS GRAFT 05/22/2008  . CARDIOMYOPATHY, ISCHEMIC 05/22/2008  . MITRAL VALVE PROLAPSE, NON-RHEUMATIC 05/22/2008  . AV BLOCK 05/22/2008  . Chronic systolic heart failure 9/47/0962  . CAROTID ARTERY STENOSIS 11/15/2009  . GERD 04/07/2008  . RENAL INSUFFICIENCY 10/17/2008  . DEGENERATIVE JOINT DISEASE, LEFT KNEE 04/07/2008  . COLONIC POLYPS, HX OF 04/07/2008  . DIVERTICULITIS, HX OF 04/07/2008  . Atrial fibrillation 04/04/2010  . Decreased left ventricular function   . Ventricular tachycardia     Status [name] catheter ablation may 2013  . CHF (congestive heart failure)     class 2 to 3    Past Surgical History  Procedure Laterality Date  . Cardiac surgery      open heart 1991, 1997, pacemaker insertion [place] 2010 CD 3231  . Lung surgery  1987  . Hernia repair  [phone]  2004  . Cholecystectomy    . Appendectomy    . Thoracotomy      secondary to lung mass  . Insert / replace / remove pacemaker      St Jude unify CD 3231/2010  . Colonoscopy N/A 08/26/2013    Procedure: COLONOSCOPY;  Surgeon: Sean Beams, MD;  Location: [facility];  Service: Endoscopy;  Laterality: N/A;  . Left heart catheterization with coronary/graft angiogram N/A 04/30/2011    Procedure: LEFT HEART CATHETERIZATION WITH Sean [name];  Surgeon: Sean Bow, MD;  Location: [facility] CATH LAB;  Service: Cardiovascular;  Laterality: N/A;  . V-tach  ablation N/A 06/26/2011    Procedure: V-TACH ABLATION;  Surgeon: Sean Grayer, MD;  Location: [facility] CATH LAB;  Service: Cardiovascular;  Laterality: N/A;  . Implantable cardioverter defibrillator generator change N/A 07/15/2013    Procedure: IMPLANTABLE CARDIOVERTER DEFIBRILLATOR GENERATOR CHANGE;  Surgeon: Sean Sprang, MD;  Location: [facility] CATH LAB;  Service: Cardiovascular;  Laterality: N/A;     Medications: Current Outpatient Prescriptions  Medication Sig Dispense Refill  . atorvastatin (LIPITOR) 20 MG tablet TAKE 1 TABLET BY MOUTH EVERY DAY 30 tablet 11  . carvedilol (COREG) 25 MG tablet TAKE 1 TABLET BY MOUTH TWICE DAILY 180 tablet 3  . diphenhydramine-acetaminophen (TYLENOL PM) 25-500 MG TABS Take 1 tablet by mouth at bedtime as needed (sleep).     . docusate sodium (COLACE) 100 MG capsule Take 100 mg by mouth daily as needed. For constipation    . fenofibrate 160 MG tablet TAKE 1 TABLET BY MOUTH EVERY DAY 60 tablet 3  . furosemide (LASIX) 40 MG tablet TAKE TWO TABLETS BY MOUTH EVERY MORNING AND ONE TABLET EVERY AFTERNOON 270 tablet 0  . isosorbide mononitrate (IMDUR) 30 MG 24 hr tablet Take 1 tablet by mouth daily. With 60mg  tablet to equal 90mg  daily  6  . isosorbide mononitrate (IMDUR) 60 MG 24 hr tablet TAKE 1 TABLET BY MOUTH EVERY DAY 90 tablet 1  . losartan (COZAAR) 50 MG tablet TAKE 1 TABLET BY MOUTH EVERY DAY 90 tablet 0  . NITROSTAT 0.4 MG SL tablet PLACE 1 TABLET UNDER THE TONGUE EVERY 5 MINUTES AS NEEDED FOR CHEST PAIN 25 tablet 0  . omeprazole (PRILOSEC) 20 MG capsule TAKE 1 CAPSULE BY MOUTH TWICE DAILY 180 capsule 1  . potassium chloride SA (K-DUR,KLOR-CON) 20 MEQ tablet TAKE 1 TABLET BY MOUTH TWICE DAILY 60 tablet 2  . ranolazine (RANEXA) 1000 MG SR tablet Take 1 tablet (1,000 mg total) by mouth 2 (two) times daily. 180 tablet 3  . Tamsulosin HCl (FLOMAX) 0.4 MG CAPS Take 0.4 mg by mouth daily.     Marland Kitchen triamcinolone cream (KENALOG) 0.1 % Apply 1 application topically as needed  (for rash).     . vitamin B-12 (CYANOCOBALAMIN) 500 MCG tablet Take 500 mcg by mouth daily.      Marland Kitchen warfarin (COUMADIN) 5 MG tablet TAKE AS DIRECTED BY COUMADIN CLINIC 30 tablet 3  . [DISCONTINUED] triamcinolone (NASACORT AQ) 55 MCG/ACT nasal inhaler Place 2 sprays into the nose as needed.       No current facility-administered medications for this visit.    Allergies: Allergies  Allergen Reactions  . Carisoprodol     REACTION: rash  . Lisinopril     REACTION: cough  . Tape     Cloth Tape tears skin  . Zolpidem Tartrate Other (See Comments)    Altered mental status  . Penicillins Rash    Social History: The  patient  reports that he quit smoking about 42 years ago. His smoking use included Cigarettes. He has a 15 pack-year smoking history. He has never used smokeless tobacco. He reports that he does not drink alcohol or use illicit drugs.   Family History: The patient's family history includes Coronary artery disease in his mother; Heart disease in his brother, mother, and sister.   Review of Systems: Please see the history of present illness.   Otherwise, the review of systems is positive for none.   All other systems are reviewed and negative.   Physical Exam: VS:  BP 120/80 mmHg  Pulse 64  Ht 6\' 1"  (1.854 m)  Wt 190 lb 6.4 oz (86.365 kg)  BMI 25.13 kg/m2  SpO2 100% .  BMI Body mass index is 25.13 kg/(m^2).  Wt Readings from Last 3 Encounters:  08/11/14 190 lb 6.4 oz (86.365 kg)  08/02/14 193 lb (87.544 kg)  04/24/14 193 lb (87.544 kg)    General: Pleasant. Elderly male - looks chronically ill but in no acute distress.  HEENT: Normal. Neck: Supple, no JVD, carotid bruits, or masses noted.  Cardiac: Regular rate and rhythm. Outflow murmur noted. No edema.  Respiratory:  Lungs are clear to auscultation bilaterally with normal work of breathing.  GI: Soft and nontender.  MS: No deformity or atrophy. Gait and ROM intact. Skin: Warm and dry. Color is normal.  Neuro:   Strength and sensation are intact and no gross focal deficits noted.  Psych: Alert, appropriate and with normal affect.   LABORATORY DATA:  EKG:  EKG is not ordered today.   Lab Results  Component Value Date   WBC 5.1 01/06/2014   HGB 13.3 01/06/2014   HCT 41.2 01/06/2014   PLT 137.0* 01/06/2014   GLUCOSE 109* 04/24/2014   CHOL 163 03/02/2014   TRIG 251.0* 03/02/2014   HDL 31.80* 03/02/2014   LDLDIRECT 88.0 03/02/2014   LDLCALC 74 04/22/2013   ALT 14 03/02/2014   AST 23 03/02/2014   NA 136 04/24/2014   K 3.9 04/24/2014   CL 100 04/24/2014   CREATININE 2.28* 04/24/2014   BUN 29* 04/24/2014   CO2 30 04/24/2014   TSH 1.229 Test methodology is 3rd generation TSH 01/21/2009   INR 3.4 08/11/2014   HGBA1C  01/21/2009    6.1 (NOTE) The ADA recommends the following therapeutic goal for glycemic control related to Hgb A1c measurement: Goal of therapy: <6.5 Hgb A1c  Reference: American Diabetes Association: Clinical Practice Recommendations 2010, Diabetes Care, 2010, 33: (Suppl  1).    Lab Results  Component Value Date   INR 3.4 08/11/2014   INR 2.6 06/23/2014   INR 2.3 05/12/2014   PROTIME 18.4 07/20/2008     BNP (last 3 results) No results for input(s): BNP in the last 8760 hours.  ProBNP (last 3 results) No results for input(s): PROBNP in the last 8760 hours.   Other Studies Reviewed Today:  Cath Final Conclusions 2013 :  1. Continued patency of the LIMA to the LAD diagonal  2. Continued patency of the stent OM2 graft  3. Known occlusion of the three remaining SVG  4. Known occlusion of the RCA   Notes Recorded by Larey Dresser, MD on 06/13/2014 at 4:50 PM Stable 40-59% bilateral ICA stenosis  Echo Study Conclusions from 06/2014  - Left ventricle: The cavity size was moderately dilated. Left ventricular geometry showed evidence of eccentric hypertrophy. Systolic function was severely reduced. The estimated ejection fraction was in  the range of  25% to 30%. Akinesis of the inferolateral, inferior, and inferoseptal myocardium. There is mild hypokinesis of the apical myocardium. Features are consistent with a pseudonormal left ventricular filling pattern, with concomitant abnormal relaxation and increased filling pressure (grade 2 diastolic dysfunction). - Ventricular septum: There is substantial lateral-to-septal dyssynchrony (>100 ms). Septal motion showed paradox. - Aortic valve: There was mild regurgitation. Valve area (VTI): 1.99 cm^2. Valve area (Vmax): 2.08 cm^2. Valve area (Vmean): 1.57 cm^2. - Mitral valve: There was moderate regurgitation directed eccentrically and posteriorly. - Left atrium: The atrium was severely dilated. - Right ventricle: The cavity size was mildly dilated. Wall thickness was normal. Systolic function was mildly reduced.  Assessment/Plan: 1. CAD: s/p CABG. Chronic stable angina, improved on ranolazine.  - Continue ASA 81, Imdur, ARB, statin, and Coreg.   2. Chronic systolic CHF: Ischemic cardiomyopathy. EF 25 to 30% on last echo. He does not appear volume overloaded on exam. He has a [employer] CRT-D device.  - Continue current Lasix dosing (80 qam, 40 qpm).  - Given CKD, will not titrate losartan at this time or add spironolactone.  - Continue current Coreg (at goal dose).  - He is to have AV optimization later this month.   3. Hyperlipidemia: on statin therapy  4. Mitral regurgitation:   5. Carotid stenosis: recent study noted.   6. Renal artery stenosis: Unilateral renal artery stenosis. BP is controlled. Sean [name] has noted that he did not think there was a lot of utility to opening this artery in the absence of uncontrolled HTN or bilateral stenosis.   7. Celiac artery stenosis: Severe celiac stenosis on abdominal dopplers. He does not describe abdominal angina. Continue to follow.   8. Paroxysmal atrial fibrillation: Patient is on warfarin. He  is in NSR today.   9. CKD: Last creatinine was 2.3. Followed by nephrology (Dr. Joelyn Oms). Sees him annually.   Current medicines are reviewed with the patient today.  The patient does not have concerns regarding medicines other than what has been noted above.  The following changes have been made:  See above.  Labs/ tests ordered today include:    Orders Placed This Encounter  Procedures  . Basic metabolic panel  . CBC     Disposition:   FU with Sean [name] in 4 months.    Patient is agreeable to this plan and will call if any problems develop in the interim.   Signed: Burtis Junes, RN, ANP-C 08/11/2014 8:23 AM  [place] [facility] [address] [place], [place]  [address] Phone: [phone] Fax: [phone]

## 2014-08-18 ENCOUNTER — Other Ambulatory Visit: Payer: Self-pay | Admitting: *Deleted

## 2014-08-18 ENCOUNTER — Other Ambulatory Visit: Payer: Self-pay | Admitting: Cardiology

## 2014-08-18 MED ORDER — ISOSORBIDE MONONITRATE ER 30 MG PO TB24: 30.0000 mg | ORAL_TABLET | Freq: Every day | ORAL | Status: DC

## 2014-08-18 MED ORDER — ISOSORBIDE MONONITRATE ER 60 MG PO TB24: 60.0000 mg | ORAL_TABLET | Freq: Every day | ORAL | Status: DC

## 2014-08-22 ENCOUNTER — Ambulatory Visit: Payer: Medicare Other | Admitting: Nurse Practitioner

## 2014-08-30 ENCOUNTER — Other Ambulatory Visit: Payer: Self-pay | Admitting: Internal Medicine

## 2014-08-30 ENCOUNTER — Ambulatory Visit (HOSPITAL_COMMUNITY): Payer: Medicare Other | Attending: Cardiovascular Disease

## 2014-08-30 ENCOUNTER — Ambulatory Visit (INDEPENDENT_AMBULATORY_CARE_PROVIDER_SITE_OTHER): Payer: Medicare Other | Admitting: Pharmacist

## 2014-08-30 ENCOUNTER — Other Ambulatory Visit: Payer: Self-pay

## 2014-08-30 DIAGNOSIS — I5022 Chronic systolic (congestive) heart failure: Secondary | ICD-10-CM

## 2014-08-30 DIAGNOSIS — I255 Ischemic cardiomyopathy: Secondary | ICD-10-CM

## 2014-08-30 DIAGNOSIS — Z5181 Encounter for therapeutic drug level monitoring: Secondary | ICD-10-CM

## 2014-08-30 DIAGNOSIS — I4891 Unspecified atrial fibrillation: Secondary | ICD-10-CM | POA: Diagnosis not present

## 2014-08-30 DIAGNOSIS — I059 Rheumatic mitral valve disease, unspecified: Secondary | ICD-10-CM

## 2014-08-30 DIAGNOSIS — I517 Cardiomegaly: Secondary | ICD-10-CM | POA: Diagnosis not present

## 2014-08-30 DIAGNOSIS — I480 Paroxysmal atrial fibrillation: Secondary | ICD-10-CM

## 2014-08-30 DIAGNOSIS — E785 Hyperlipidemia, unspecified: Secondary | ICD-10-CM | POA: Insufficient documentation

## 2014-08-30 DIAGNOSIS — I10 Essential (primary) hypertension: Secondary | ICD-10-CM | POA: Insufficient documentation

## 2014-08-30 DIAGNOSIS — Z7901 Long term (current) use of anticoagulants: Secondary | ICD-10-CM

## 2014-08-30 LAB — CUP PACEART INCLINIC DEVICE CHECK
Battery Remaining Longevity: 54 mo
Brady Statistic RA Percent Paced: 18 %
Brady Statistic RV Percent Paced: 99.19 %
HIGH POWER IMPEDANCE MEASURED VALUE: 43.9198
Lead Channel Impedance Value: 400 Ohm
Lead Channel Pacing Threshold Amplitude: 0.5 V
Lead Channel Pacing Threshold Amplitude: 0.75 V
Lead Channel Pacing Threshold Amplitude: 1.75 V
Lead Channel Pacing Threshold Amplitude: 1.75 V
Lead Channel Pacing Threshold Pulse Width: 0.5 ms
Lead Channel Pacing Threshold Pulse Width: 0.5 ms
Lead Channel Sensing Intrinsic Amplitude: 11.3 mV
Lead Channel Sensing Intrinsic Amplitude: 2.8 mV
Lead Channel Setting Pacing Amplitude: 2 V
Lead Channel Setting Pacing Amplitude: 2.5 V
Lead Channel Setting Pacing Pulse Width: 1 ms
MDC IDC MSMT LEADCHNL LV IMPEDANCE VALUE: 300 Ohm
MDC IDC MSMT LEADCHNL LV PACING THRESHOLD PULSEWIDTH: 1 ms
MDC IDC MSMT LEADCHNL LV PACING THRESHOLD PULSEWIDTH: 1 ms
MDC IDC MSMT LEADCHNL RA PACING THRESHOLD AMPLITUDE: 0.75 V
MDC IDC MSMT LEADCHNL RA PACING THRESHOLD PULSEWIDTH: 0.5 ms
MDC IDC MSMT LEADCHNL RV IMPEDANCE VALUE: 462.5 Ohm
MDC IDC MSMT LEADCHNL RV PACING THRESHOLD AMPLITUDE: 0.5 V
MDC IDC MSMT LEADCHNL RV PACING THRESHOLD PULSEWIDTH: 0.5 ms
MDC IDC SESS DTM: 20160727162028
MDC IDC SET LEADCHNL LV PACING AMPLITUDE: 2.5 V
MDC IDC SET LEADCHNL RV PACING PULSEWIDTH: 0.5 ms
MDC IDC SET LEADCHNL RV SENSING SENSITIVITY: 2 mV
MDC IDC SET ZONE DETECTION INTERVAL: 350 ms
Pulse Gen Serial Number: 7183458
Zone Setting Detection Interval: 250 ms
Zone Setting Detection Interval: 280 ms

## 2014-08-30 LAB — POCT INR: INR: 4.5

## 2014-09-03 ENCOUNTER — Other Ambulatory Visit: Payer: Self-pay | Admitting: Internal Medicine

## 2014-09-04 ENCOUNTER — Other Ambulatory Visit: Payer: Self-pay | Admitting: Internal Medicine

## 2014-09-07 ENCOUNTER — Ambulatory Visit (INDEPENDENT_AMBULATORY_CARE_PROVIDER_SITE_OTHER): Payer: Medicare Other | Admitting: *Deleted

## 2014-09-07 ENCOUNTER — Telehealth: Payer: Self-pay | Admitting: *Deleted

## 2014-09-07 DIAGNOSIS — Z5181 Encounter for therapeutic drug level monitoring: Secondary | ICD-10-CM

## 2014-09-07 DIAGNOSIS — I059 Rheumatic mitral valve disease, unspecified: Secondary | ICD-10-CM

## 2014-09-07 DIAGNOSIS — Z7901 Long term (current) use of anticoagulants: Secondary | ICD-10-CM | POA: Diagnosis not present

## 2014-09-07 DIAGNOSIS — I4891 Unspecified atrial fibrillation: Secondary | ICD-10-CM

## 2014-09-07 DIAGNOSIS — I480 Paroxysmal atrial fibrillation: Secondary | ICD-10-CM

## 2014-09-07 LAB — POCT INR: INR: 1.7

## 2014-09-07 NOTE — Telephone Encounter (Signed)
Ranexa samples have been left at the [facility] for pt.  To Dr Aundra Dubin

## 2014-09-07 NOTE — Telephone Encounter (Signed)
Pt states he does not want to complete patient assistance application for Ranexa [employer], pt states he does not agree with the form and states that there is too much personal information on it. Pt states once he runs out of his current supply of Ranexa he is going to stop taking it, pt states he has not noticed any difference in the way he feels since he has been taking it.  Pt advised I will forward to Dr Aundra Dubin for review.

## 2014-09-07 NOTE — Telephone Encounter (Signed)
Pt states he will stop Ranexa when he finishes his current supply that runs out Sun, he will not get samples at [facility], he will not complete patient portion of Ranexa Connects Patient assistance form.

## 2014-09-07 NOTE — Telephone Encounter (Signed)
New message    Pt calling in regards to medication  Please call to discuss

## 2014-09-07 NOTE — Telephone Encounter (Signed)
Medication samples have been provided to the patient.  Drug name: ranexa 1000mg   Qty: 70  LOT: [id number]  Exp.Date: 05/2017  Samples left at front desk for patient pick-up.   Sheral Apley M 1:50 PM 09/07/2014

## 2014-09-07 NOTE — Telephone Encounter (Signed)
Pt needs samples of Ranexa 500mg . Lelon Frohlich said that she will send pt over to pick up samples.   Thanks

## 2014-09-07 NOTE — Telephone Encounter (Signed)
Pt advised there are samples of Ranexa at the [facility] to pick up. Pt's wife states  pt will complete his portion of the [place] application and return the completed patient portion of application to [employer]. Pt's wife advised to contact Jenean Lindau in our office to let her know the patient's portion of the application has been completed and returned to [place]  I will forward to [facility] for review.

## 2014-09-07 NOTE — Telephone Encounter (Signed)
Would ask someone at our office to get Ranexa rep to bring by samples asap to tide him over until he can get patient assistance.  Also can see if there are samples at [facility] office.

## 2014-09-07 NOTE — Telephone Encounter (Signed)
If he does not think it helps, he can stop it. It is purely for symptoms.

## 2014-09-07 NOTE — Telephone Encounter (Signed)
Pt here for [facility] today and asked to speak with me about Ranexa prescription. Pt states he only has enough Ranexa  to last until this  Sunday.  Pt states when he went to get his last refill it cost him $240( he has 5 days left of that refill) the cost prior to last refill was $30. Pt's wife states pharmacy told her the cost was so much more because he was in the donut hole and there was no generic for Ranexa. Pt's wife does not know how long pt will have to pay higher copay for Ranexa, she states it will probably be to the end of the year.   Pt's wife advised that I called [employer]. [facility] [facility] states pt may be eligible for pt assistance, pt would need to submit a patient assistance application, it would be reviewed, then pt would be notified if he is eligible for assistance.  I have given pt a [employer] Patient Assistance application to complete for Ranexa. Pt is aware that we do not have any samples of Ranexa. Pt's wife states pt will run out of current Ranexa supply this Sunday,  pt will not be able to get Ranexa refilled at current cost of over $200. Pt's wife asking what pt should do once he runs out of current Ranexa supply this Sunday.   Pt advised I will forward to Dr Aundra Dubin for review.

## 2014-09-16 ENCOUNTER — Other Ambulatory Visit: Payer: Self-pay | Admitting: Family Medicine

## 2014-09-18 ENCOUNTER — Other Ambulatory Visit: Payer: Self-pay | Admitting: Family Medicine

## 2014-09-22 ENCOUNTER — Ambulatory Visit (INDEPENDENT_AMBULATORY_CARE_PROVIDER_SITE_OTHER): Payer: Medicare Other | Admitting: Family Medicine

## 2014-09-22 ENCOUNTER — Ambulatory Visit (INDEPENDENT_AMBULATORY_CARE_PROVIDER_SITE_OTHER): Payer: Medicare Other | Admitting: *Deleted

## 2014-09-22 ENCOUNTER — Encounter: Payer: Self-pay | Admitting: Family Medicine

## 2014-09-22 VITALS — BP 118/70 | HR 72 | Temp 98.0°F | Wt 193.0 lb

## 2014-09-22 DIAGNOSIS — I255 Ischemic cardiomyopathy: Secondary | ICD-10-CM | POA: Diagnosis not present

## 2014-09-22 DIAGNOSIS — I4891 Unspecified atrial fibrillation: Secondary | ICD-10-CM

## 2014-09-22 DIAGNOSIS — I059 Rheumatic mitral valve disease, unspecified: Secondary | ICD-10-CM

## 2014-09-22 DIAGNOSIS — Z5181 Encounter for therapeutic drug level monitoring: Secondary | ICD-10-CM

## 2014-09-22 DIAGNOSIS — I480 Paroxysmal atrial fibrillation: Secondary | ICD-10-CM

## 2014-09-22 DIAGNOSIS — Z7901 Long term (current) use of anticoagulants: Secondary | ICD-10-CM | POA: Diagnosis not present

## 2014-09-22 DIAGNOSIS — R4189 Other symptoms and signs involving cognitive functions and awareness: Secondary | ICD-10-CM

## 2014-09-22 LAB — POCT INR: INR: 3.2

## 2014-09-22 NOTE — Patient Instructions (Signed)
Cognitive Tips  Keep a journal/notebook with sections for the following (or use sections separately as needed):  Calendar and appointment sheet, schedule for each day, lists of reminders (such as grocery lists or "to do" list), homework assignments for therapy, important information such as family and friends names / addresses / phone numbers, medications, medical history and doctors name / phone numbers.  Avoid / remove clutter and unnecessary items from areas such as countertops / cabinets in kitchen and bathroom, closets, etc.  Organize items by purpose.  Baskets and bins help with this.  Leave notes for reminders above task to be completed.  For example: Note to turn off stove over the the stove; note to lock door beside the door, not to brush teeth then wash face by sink, note to take medication on table etc.  To help recall names of people of people or things, mentally or verbally go through the alphabet to try to determine the 1st letter of the word as this may trigger the name or word you are looking for.  If this is too difficult and someone else knows the word, have them give you the first letter by asking, "does it start with an "A", "B", "C" etc. (or have them give you the first sound of the word or some other clue).  Review family events, occasions, names, etc.  Pictures are a good way to trigger memory.  Have others correct you if you answer something incorrectly.  Have them speak slowly with a few words to give you time to process and respond.  Don't let others automatically problem-solve. (For example: don't let them automatically lay out clothes in the correct position, but hand it to you folded so that you can figure it out for yourself.)  However, if you need help with tasks, they should give you as little as they can so that you can be successful.  If appropriate and safe, they may allow you to make mistakes so that you can figure out how to correct the error.  (For example, they  may allow you to put your shoes on the wrong foot to see if you notice that is wrong).  If you struggle, they should give you a cue.  (Example: "Do your shoes feel right?"  "Do they look right?") 

## 2014-09-22 NOTE — Progress Notes (Signed)
Subjective:    Patient ID: Sean [name], male    DOB: [birth year], 81 y.o.   MRN: [id number]  HPI Patient seen accompanied by wife with concerns for memory problems. Over the past year she has noted that he seems to get confused with directions. She's noticed some impairment with short-term memory. Sometimes forgets to take medications. She thinks this has gone on for several months. Very strong family history of dementia. Apparently had 2 sisters and one brother who had dementia. Denies any recent headaches. No recent head injury.  No focal weakness or any speech changes.  Multiple chronic medical problem including hyperlipidemia, hypertension, CAD, ischemic cardiomyopathy, history of chronic systolic heart failure, peripheral vascular disease, atrial fibrillation, history of ventricular tachycardia, obstructive sleep apnea, chronic kidney disease. No history of any known strokes. No recent focal weakness.  Past Medical History  Diagnosis Date  . HYPERLIPIDEMIA 04/07/2008  . HYPERTENSION 04/07/2008  . CAD, ARTERY BYPASS GRAFT 05/22/2008  . CARDIOMYOPATHY, ISCHEMIC 05/22/2008  . MITRAL VALVE PROLAPSE, NON-RHEUMATIC 05/22/2008  . AV BLOCK 05/22/2008  . Chronic systolic heart failure 9/47/0962  . CAROTID ARTERY STENOSIS 11/15/2009  . GERD 04/07/2008  . RENAL INSUFFICIENCY 10/17/2008  . DEGENERATIVE JOINT DISEASE, LEFT KNEE 04/07/2008  . COLONIC POLYPS, HX OF 04/07/2008  . DIVERTICULITIS, HX OF 04/07/2008  . Atrial fibrillation 04/04/2010  . Decreased left ventricular function   . Ventricular tachycardia     Status post catheter ablation may 2013  . CHF (congestive heart failure)     class 2 to 3   Past Surgical History  Procedure Laterality Date  . Cardiac surgery      open heart 1991, 1997, pacemaker insertion [place] 2010 CD 3231  . Lung surgery  1987  . Hernia repair  1952    2004  . Cholecystectomy    . Appendectomy    . Thoracotomy      secondary to lung mass  . Insert / replace /  remove pacemaker      St Jude unify CD 3231/2010  . Colonoscopy N/A 08/26/2013    Procedure: COLONOSCOPY;  Surgeon: Beryle Beams, MD;  Location: [facility];  Service: Endoscopy;  Laterality: N/A;  . Left heart catheterization with coronary/graft angiogram N/A 04/30/2011    Procedure: LEFT HEART CATHETERIZATION WITH Beatrix Fetters;  Surgeon: Hillary Bow, MD;  Location: [facility] CATH LAB;  Service: Cardiovascular;  Laterality: N/A;  . V-tach ablation N/A 06/26/2011    Procedure: V-TACH ABLATION;  Surgeon: Thompson Grayer, MD;  Location: [facility] CATH LAB;  Service: Cardiovascular;  Laterality: N/A;  . Implantable cardioverter defibrillator generator change N/A 07/15/2013    Procedure: IMPLANTABLE CARDIOVERTER DEFIBRILLATOR GENERATOR CHANGE;  Surgeon: Deboraha Sprang, MD;  Location: [facility] CATH LAB;  Service: Cardiovascular;  Laterality: N/A;    reports that he quit smoking about 42 years ago. His smoking use included Cigarettes. He has a 15 pack-year smoking history. He has never used smokeless tobacco. He reports that he does not drink alcohol or use illicit drugs. family history includes Coronary artery disease in his mother; Heart disease in his brother, mother, and sister. Allergies  Allergen Reactions  . Carisoprodol     REACTION: rash  . Lisinopril     REACTION: cough  . Tape     Cloth Tape tears skin  . Zolpidem Tartrate Other (See Comments)    Altered mental status  . Penicillins Rash       Review of Systems  Constitutional: Negative for  fatigue.  Eyes: Negative for visual disturbance.  Respiratory: Negative for cough, chest tightness and shortness of breath.   Cardiovascular: Negative for chest pain, palpitations and leg swelling.  Genitourinary: Negative for dysuria.  Neurological: Negative for dizziness, syncope, weakness, light-headedness and headaches.  Psychiatric/Behavioral: Negative for sleep disturbance, dysphoric mood and agitation.       Objective:   Physical Exam   Constitutional: He is oriented to person, place, and time. He appears well-developed and well-nourished.  Cardiovascular: Normal rate and regular rhythm.   Pulmonary/Chest: Effort normal and breath sounds normal. No respiratory distress. He has no wheezes. He has no rales.  Musculoskeletal: He exhibits no edema.  Neurological: He is alert and oriented to person, place, and time. He has normal reflexes. No cranial nerve deficit. Coordination normal.  Normal cerebellar function. Gait normal. No focal weakness.  Psychiatric:  MMSE 20/30.  No prior testing for comparison          Assessment & Plan:  Cognitive impairment. This appears to be at least moderate if not severe with MMSE 20/30.  Check TSH and B12. If normal,  consider starting low-dose Aricept.

## 2014-09-22 NOTE — Progress Notes (Signed)
Pre visit review using our clinic review tool, if applicable. No additional management support is needed unless otherwise documented below in the visit note. 

## 2014-09-23 LAB — TSH: TSH: 1.556 u[IU]/mL (ref 0.350–4.500)

## 2014-09-23 LAB — VITAMIN B12: VITAMIN B 12: 602 pg/mL (ref 211–911)

## 2014-09-27 ENCOUNTER — Other Ambulatory Visit: Payer: Self-pay | Admitting: Family Medicine

## 2014-09-27 MED ORDER — DONEPEZIL HCL 5 MG PO TABS: 5.0000 mg | ORAL_TABLET | Freq: Every day | ORAL | Status: DC

## 2014-10-06 ENCOUNTER — Ambulatory Visit (INDEPENDENT_AMBULATORY_CARE_PROVIDER_SITE_OTHER): Payer: Medicare Other | Admitting: *Deleted

## 2014-10-06 DIAGNOSIS — I480 Paroxysmal atrial fibrillation: Secondary | ICD-10-CM

## 2014-10-06 DIAGNOSIS — Z7901 Long term (current) use of anticoagulants: Secondary | ICD-10-CM | POA: Diagnosis not present

## 2014-10-06 DIAGNOSIS — I059 Rheumatic mitral valve disease, unspecified: Secondary | ICD-10-CM

## 2014-10-06 DIAGNOSIS — Z5181 Encounter for therapeutic drug level monitoring: Secondary | ICD-10-CM

## 2014-10-06 DIAGNOSIS — I4891 Unspecified atrial fibrillation: Secondary | ICD-10-CM | POA: Diagnosis not present

## 2014-10-06 LAB — POCT INR: INR: 3.5

## 2014-10-16 ENCOUNTER — Other Ambulatory Visit: Payer: Self-pay | Admitting: Family Medicine

## 2014-10-19 ENCOUNTER — Ambulatory Visit (INDEPENDENT_AMBULATORY_CARE_PROVIDER_SITE_OTHER): Payer: Medicare Other | Admitting: *Deleted

## 2014-10-19 DIAGNOSIS — Z5181 Encounter for therapeutic drug level monitoring: Secondary | ICD-10-CM

## 2014-10-19 DIAGNOSIS — I059 Rheumatic mitral valve disease, unspecified: Secondary | ICD-10-CM | POA: Diagnosis not present

## 2014-10-19 DIAGNOSIS — Z7901 Long term (current) use of anticoagulants: Secondary | ICD-10-CM | POA: Diagnosis not present

## 2014-10-19 DIAGNOSIS — I4891 Unspecified atrial fibrillation: Secondary | ICD-10-CM

## 2014-10-19 DIAGNOSIS — I480 Paroxysmal atrial fibrillation: Secondary | ICD-10-CM

## 2014-10-19 LAB — POCT INR: INR: 3.2

## 2014-10-23 ENCOUNTER — Ambulatory Visit (INDEPENDENT_AMBULATORY_CARE_PROVIDER_SITE_OTHER): Payer: Medicare Other | Admitting: Family Medicine

## 2014-10-23 ENCOUNTER — Encounter: Payer: Self-pay | Admitting: Family Medicine

## 2014-10-23 VITALS — BP 120/50 | HR 61 | Temp 97.9°F | Ht 73.0 in | Wt 191.5 lb

## 2014-10-23 DIAGNOSIS — R4189 Other symptoms and signs involving cognitive functions and awareness: Secondary | ICD-10-CM | POA: Diagnosis not present

## 2014-10-23 DIAGNOSIS — I255 Ischemic cardiomyopathy: Secondary | ICD-10-CM | POA: Diagnosis not present

## 2014-10-23 MED ORDER — DONEPEZIL HCL 10 MG PO TABS: 10.0000 mg | ORAL_TABLET | Freq: Every day | ORAL | Status: DC

## 2014-10-23 NOTE — Progress Notes (Signed)
Subjective:    Patient ID: Sean [name], male    DOB: 11/02/1931, 81 y.o.   MRN: [id number]  HPI Patient is seen for follow-up regarding cognitive impairment. Recent MMSE 20 out of 30. We started Aricept 5 mg daily and wife thinks he has been less anxious and agitated. Still no improvement in memory. B12 and TSH were normal. He's had no side effects such as nausea. She is trying to give him puzzle books and other things keep him engaged  Past Medical History  Diagnosis Date  . HYPERLIPIDEMIA 04/07/2008  . HYPERTENSION 04/07/2008  . CAD, ARTERY BYPASS GRAFT 05/22/2008  . CARDIOMYOPATHY, ISCHEMIC 05/22/2008  . MITRAL VALVE PROLAPSE, NON-RHEUMATIC 05/22/2008  . AV BLOCK 05/22/2008  . Chronic systolic heart failure 2/54/2706  . CAROTID ARTERY STENOSIS 11/15/2009  . GERD 04/07/2008  . RENAL INSUFFICIENCY 10/17/2008  . DEGENERATIVE JOINT DISEASE, LEFT KNEE 04/07/2008  . COLONIC POLYPS, HX OF 04/07/2008  . DIVERTICULITIS, HX OF 04/07/2008  . Atrial fibrillation 04/04/2010  . Decreased left ventricular function   . Ventricular tachycardia     Status post catheter ablation may 2013  . CHF (congestive heart failure)     class 2 to 3   Past Surgical History  Procedure Laterality Date  . Cardiac surgery      open heart 1991, 1997, pacemaker insertion [place] 2010 CD 3231  . Lung surgery  1987  . Hernia repair  1952    2004  . Cholecystectomy    . Appendectomy    . Thoracotomy      secondary to lung mass  . Insert / replace / remove pacemaker      St Jude unify CD 3231/2010  . Colonoscopy N/A 08/26/2013    Procedure: COLONOSCOPY;  Surgeon: Beryle Beams, MD;  Location: [facility];  Service: Endoscopy;  Laterality: N/A;  . Left heart catheterization with coronary/graft angiogram N/A 04/30/2011    Procedure: LEFT HEART CATHETERIZATION WITH Beatrix Fetters;  Surgeon: Hillary Bow, MD;  Location: [facility] CATH LAB;  Service: Cardiovascular;  Laterality: N/A;  . V-tach ablation N/A 06/26/2011      Procedure: V-TACH ABLATION;  Surgeon: Thompson Grayer, MD;  Location: [facility] CATH LAB;  Service: Cardiovascular;  Laterality: N/A;  . Implantable cardioverter defibrillator generator change N/A 07/15/2013    Procedure: IMPLANTABLE CARDIOVERTER DEFIBRILLATOR GENERATOR CHANGE;  Surgeon: Deboraha Sprang, MD;  Location: [facility] CATH LAB;  Service: Cardiovascular;  Laterality: N/A;    reports that he quit smoking about 42 years ago. His smoking use included Cigarettes. He has a 15 pack-year smoking history. He has never used smokeless tobacco. He reports that he does not drink alcohol or use illicit drugs. family history includes Coronary artery disease in his mother; Heart disease in his brother, mother, and sister. Allergies  Allergen Reactions  . Carisoprodol     REACTION: rash  . Lisinopril     REACTION: cough  . Tape     Cloth Tape tears skin  . Zolpidem Tartrate Other (See Comments)    Altered mental status  . Penicillins Rash      Review of Systems  Constitutional: Negative for fatigue.  Eyes: Negative for visual disturbance.  Respiratory: Negative for cough, chest tightness and shortness of breath.   Cardiovascular: Negative for chest pain, palpitations and leg swelling.  Neurological: Negative for dizziness, syncope, weakness, light-headedness and headaches.  Psychiatric/Behavioral: Negative for agitation.       Objective:   Physical Exam  Constitutional: He  appears well-developed and well-nourished.  Neck: Neck supple. No thyromegaly present.  Cardiovascular: Normal rate and regular rhythm.   Murmur heard. Pulmonary/Chest: Effort normal and breath sounds normal. No respiratory distress. He has no wheezes. He has no rales.  Musculoskeletal: He exhibits no edema.  Neurological: He is alert.          Assessment & Plan:  Cognitive impairment. Suspect Alzheimer's dementia. Titrate Aricept 10 mg daily. Recent B12 and TSH normal. Reassess 4 months and repeat mini mental status exam  then

## 2014-10-23 NOTE — Progress Notes (Signed)
Pre visit review using our clinic review tool, if applicable. No additional management support is needed unless otherwise documented below in the visit note. 

## 2014-10-24 ENCOUNTER — Encounter: Payer: Self-pay | Admitting: Internal Medicine

## 2014-11-01 ENCOUNTER — Encounter: Payer: Self-pay | Admitting: Cardiovascular Disease

## 2014-11-01 ENCOUNTER — Ambulatory Visit (INDEPENDENT_AMBULATORY_CARE_PROVIDER_SITE_OTHER): Payer: Medicare Other | Admitting: *Deleted

## 2014-11-01 ENCOUNTER — Encounter: Payer: Self-pay | Admitting: Internal Medicine

## 2014-11-01 DIAGNOSIS — I255 Ischemic cardiomyopathy: Secondary | ICD-10-CM | POA: Diagnosis not present

## 2014-11-01 DIAGNOSIS — I5022 Chronic systolic (congestive) heart failure: Secondary | ICD-10-CM

## 2014-11-02 NOTE — Progress Notes (Signed)
Remote ICD transmission.   

## 2014-11-03 ENCOUNTER — Ambulatory Visit (INDEPENDENT_AMBULATORY_CARE_PROVIDER_SITE_OTHER): Payer: Medicare Other | Admitting: *Deleted

## 2014-11-03 DIAGNOSIS — Z5181 Encounter for therapeutic drug level monitoring: Secondary | ICD-10-CM | POA: Diagnosis not present

## 2014-11-03 DIAGNOSIS — I059 Rheumatic mitral valve disease, unspecified: Secondary | ICD-10-CM | POA: Diagnosis not present

## 2014-11-03 DIAGNOSIS — I4891 Unspecified atrial fibrillation: Secondary | ICD-10-CM | POA: Diagnosis not present

## 2014-11-03 DIAGNOSIS — I480 Paroxysmal atrial fibrillation: Secondary | ICD-10-CM

## 2014-11-03 DIAGNOSIS — Z7901 Long term (current) use of anticoagulants: Secondary | ICD-10-CM | POA: Diagnosis not present

## 2014-11-03 LAB — POCT INR: INR: 1.8

## 2014-11-05 ENCOUNTER — Other Ambulatory Visit: Payer: Self-pay | Admitting: Cardiology

## 2014-11-09 LAB — CUP PACEART REMOTE DEVICE CHECK
Battery Remaining Percentage: 78 %
Brady Statistic AP VP Percent: 29 %
Brady Statistic AS VP Percent: 70 %
HighPow Impedance: 39 Ohm
HighPow Impedance: 39 Ohm
Lead Channel Impedance Value: 360 Ohm
Lead Channel Impedance Value: 430 Ohm
Lead Channel Pacing Threshold Amplitude: 0.75 V
Lead Channel Pacing Threshold Pulse Width: 0.5 ms
Lead Channel Pacing Threshold Pulse Width: 1 ms
Lead Channel Sensing Intrinsic Amplitude: 2.1 mV
Lead Channel Setting Pacing Amplitude: 2 V
Lead Channel Setting Pacing Amplitude: 2.5 V
MDC IDC MSMT BATTERY REMAINING LONGEVITY: 52 mo
MDC IDC MSMT BATTERY VOLTAGE: 2.96 V
MDC IDC MSMT LEADCHNL LV IMPEDANCE VALUE: 280 Ohm
MDC IDC MSMT LEADCHNL LV PACING THRESHOLD AMPLITUDE: 1.75 V
MDC IDC MSMT LEADCHNL RA PACING THRESHOLD PULSEWIDTH: 0.5 ms
MDC IDC MSMT LEADCHNL RV PACING THRESHOLD AMPLITUDE: 0.5 V
MDC IDC MSMT LEADCHNL RV SENSING INTR AMPL: 12 mV
MDC IDC PG SERIAL: 7183458
MDC IDC SESS DTM: 20160928123045
MDC IDC SET LEADCHNL LV PACING AMPLITUDE: 2.5 V
MDC IDC SET LEADCHNL LV PACING PULSEWIDTH: 1 ms
MDC IDC SET LEADCHNL RV PACING PULSEWIDTH: 0.5 ms
MDC IDC SET LEADCHNL RV SENSING SENSITIVITY: 2 mV
MDC IDC SET ZONE DETECTION INTERVAL: 250 ms
MDC IDC SET ZONE DETECTION INTERVAL: 350 ms
MDC IDC STAT BRADY AP VS PERCENT: 1 %
MDC IDC STAT BRADY AS VS PERCENT: 1 %
MDC IDC STAT BRADY RA PERCENT PACED: 28 %
Zone Setting Detection Interval: 280 ms

## 2014-11-14 ENCOUNTER — Other Ambulatory Visit: Payer: Self-pay | Admitting: *Deleted

## 2014-11-14 ENCOUNTER — Other Ambulatory Visit: Payer: Self-pay | Admitting: Cardiology

## 2014-11-14 MED ORDER — NITROGLYCERIN 0.4 MG SL SUBL: SUBLINGUAL_TABLET | SUBLINGUAL | Status: DC

## 2014-11-17 ENCOUNTER — Ambulatory Visit (INDEPENDENT_AMBULATORY_CARE_PROVIDER_SITE_OTHER): Payer: Medicare Other | Admitting: *Deleted

## 2014-11-17 DIAGNOSIS — I480 Paroxysmal atrial fibrillation: Secondary | ICD-10-CM

## 2014-11-17 DIAGNOSIS — I059 Rheumatic mitral valve disease, unspecified: Secondary | ICD-10-CM | POA: Diagnosis not present

## 2014-11-17 DIAGNOSIS — I4891 Unspecified atrial fibrillation: Secondary | ICD-10-CM | POA: Diagnosis not present

## 2014-11-17 DIAGNOSIS — Z5181 Encounter for therapeutic drug level monitoring: Secondary | ICD-10-CM | POA: Diagnosis not present

## 2014-11-17 DIAGNOSIS — Z7901 Long term (current) use of anticoagulants: Secondary | ICD-10-CM | POA: Diagnosis not present

## 2014-11-17 LAB — POCT INR: INR: 2.4

## 2014-11-20 ENCOUNTER — Encounter: Payer: Self-pay | Admitting: Family Medicine

## 2014-11-20 ENCOUNTER — Ambulatory Visit (INDEPENDENT_AMBULATORY_CARE_PROVIDER_SITE_OTHER): Payer: Medicare Other | Admitting: Family Medicine

## 2014-11-20 VITALS — BP 120/50 | HR 69 | Temp 97.5°F | Wt 192.8 lb

## 2014-11-20 DIAGNOSIS — R4189 Other symptoms and signs involving cognitive functions and awareness: Secondary | ICD-10-CM

## 2014-11-20 DIAGNOSIS — I255 Ischemic cardiomyopathy: Secondary | ICD-10-CM | POA: Diagnosis not present

## 2014-11-20 NOTE — Progress Notes (Signed)
Pre visit review using our clinic review tool, if applicable. No additional management support is needed unless otherwise documented below in the visit note. 

## 2014-11-20 NOTE — Progress Notes (Signed)
Subjective:    Patient ID: Sean [name], male    DOB: 17-Jan-1932, 81 y.o.   MRN: [id number]  HPI Follow-up regarding cognitive impairment. Recent MMSE score of 20 out of 30.   B 12 and TSH normal. We started Aricept 10 mg daily and wife has seen some improvement. He is unable to remember things like taking his medications better. He still has difficulty with remembering where to turn and directional things with driving. He is not driving by himself very large distances. Denies any depressive symptoms  History of systolic heart failure. No recent orthopnea or dyspnea. No recent dizziness.  Past Medical History  Diagnosis Date  . HYPERLIPIDEMIA 04/07/2008  . HYPERTENSION 04/07/2008  . CAD, ARTERY BYPASS GRAFT 05/22/2008  . CARDIOMYOPATHY, ISCHEMIC 05/22/2008  . MITRAL VALVE PROLAPSE, NON-RHEUMATIC 05/22/2008  . AV BLOCK 05/22/2008  . Chronic systolic heart failure ([place]) 10/13/2008  . CAROTID ARTERY STENOSIS 11/15/2009  . GERD 04/07/2008  . RENAL INSUFFICIENCY 10/17/2008  . DEGENERATIVE JOINT DISEASE, LEFT KNEE 04/07/2008  . COLONIC POLYPS, HX OF 04/07/2008  . DIVERTICULITIS, HX OF 04/07/2008  . Atrial fibrillation ([place]) 04/04/2010  . Decreased left ventricular function   . Ventricular tachycardia ([place])     Status post catheter ablation may 2013  . CHF (congestive heart failure) (H[facility])     class 2 to 3   Past Surgical History  Procedure Laterality Date  . Cardiac surgery      open heart 1991, 1997, pacemaker insertion [place] 2010 CD 3231  . Lung surgery  1987  . Hernia repair  1952    2004  . Cholecystectomy    . Appendectomy    . Thoracotomy      secondary to lung mass  . Insert / replace / remove pacemaker      St Jude unify CD 3231/2010  . Colonoscopy N/A 08/26/2013    Procedure: COLONOSCOPY;  Surgeon: Beryle Beams, MD;  Location: [facility];  Service: Endoscopy;  Laterality: N/A;  . Left heart catheterization with coronary/graft angiogram N/A 04/30/2011    Procedure: LEFT HEART  CATHETERIZATION WITH Beatrix Fetters;  Surgeon: Hillary Bow, MD;  Location: [facility] CATH LAB;  Service: Cardiovascular;  Laterality: N/A;  . V-tach ablation N/A 06/26/2011    Procedure: V-TACH ABLATION;  Surgeon: Thompson Grayer, MD;  Location: [facility] CATH LAB;  Service: Cardiovascular;  Laterality: N/A;  . Implantable cardioverter defibrillator generator change N/A 07/15/2013    Procedure: IMPLANTABLE CARDIOVERTER DEFIBRILLATOR GENERATOR CHANGE;  Surgeon: Deboraha Sprang, MD;  Location: [facility] CATH LAB;  Service: Cardiovascular;  Laterality: N/A;    reports that he quit smoking about 42 years ago. His smoking use included Cigarettes. He has a 15 pack-year smoking history. He has never used smokeless tobacco. He reports that he does not drink alcohol or use illicit drugs. family history includes Coronary artery disease in his mother; Heart disease in his brother, mother, and sister. Allergies  Allergen Reactions  . Carisoprodol     REACTION: rash  . Lisinopril     REACTION: cough  . Tape     Cloth Tape tears skin  . Zolpidem Tartrate Other (See Comments)    Altered mental status  . Penicillins Rash      Review of Systems  Constitutional: Positive for fatigue.  Eyes: Negative for visual disturbance.  Respiratory: Negative for cough, chest tightness and shortness of breath.   Cardiovascular: Negative for chest pain, palpitations and leg swelling.  Neurological: Negative for dizziness,  syncope, weakness, light-headedness and headaches.  Psychiatric/Behavioral: Negative for dysphoric mood and agitation.       Objective:   Physical Exam  Constitutional: He appears well-developed and well-nourished.  Cardiovascular: Normal rate and regular rhythm.   Murmur heard. Pulmonary/Chest: Effort normal and breath sounds normal. No respiratory distress. He has no wheezes. He has no rales.  Musculoskeletal: He exhibits no edema.  Neurological: He is alert.  Psychiatric: He has a normal mood and  affect.  MMSE 24/30.            Assessment & Plan:  Cognitive impairment with probable dementia. Improved following initiation of Aricept. His score actually went up 4 points from couple months ago. Continue Aricept 10 mg daily. Reassess 6 months and repeat mental status exam then. We discussed possible addition of Namenda

## 2014-12-08 ENCOUNTER — Ambulatory Visit (INDEPENDENT_AMBULATORY_CARE_PROVIDER_SITE_OTHER): Payer: Medicare Other | Admitting: *Deleted

## 2014-12-08 DIAGNOSIS — Z7901 Long term (current) use of anticoagulants: Secondary | ICD-10-CM

## 2014-12-08 DIAGNOSIS — I4891 Unspecified atrial fibrillation: Secondary | ICD-10-CM | POA: Diagnosis not present

## 2014-12-08 DIAGNOSIS — I480 Paroxysmal atrial fibrillation: Secondary | ICD-10-CM

## 2014-12-08 DIAGNOSIS — I059 Rheumatic mitral valve disease, unspecified: Secondary | ICD-10-CM

## 2014-12-08 DIAGNOSIS — Z5181 Encounter for therapeutic drug level monitoring: Secondary | ICD-10-CM | POA: Diagnosis not present

## 2014-12-08 LAB — POCT INR: INR: 1.9

## 2014-12-12 ENCOUNTER — Other Ambulatory Visit: Payer: Self-pay | Admitting: Family Medicine

## 2014-12-13 ENCOUNTER — Encounter: Payer: Self-pay | Admitting: Cardiology

## 2014-12-18 ENCOUNTER — Other Ambulatory Visit: Payer: Self-pay | Admitting: *Deleted

## 2014-12-18 MED ORDER — FENOFIBRATE 160 MG PO TABS: 160.0000 mg | ORAL_TABLET | Freq: Every day | ORAL | Status: DC

## 2014-12-24 ENCOUNTER — Other Ambulatory Visit: Payer: Self-pay | Admitting: Family Medicine

## 2015-01-01 ENCOUNTER — Ambulatory Visit (INDEPENDENT_AMBULATORY_CARE_PROVIDER_SITE_OTHER): Payer: Medicare Other | Admitting: *Deleted

## 2015-01-01 DIAGNOSIS — Z5181 Encounter for therapeutic drug level monitoring: Secondary | ICD-10-CM

## 2015-01-01 DIAGNOSIS — I4891 Unspecified atrial fibrillation: Secondary | ICD-10-CM

## 2015-01-01 DIAGNOSIS — Z7901 Long term (current) use of anticoagulants: Secondary | ICD-10-CM | POA: Diagnosis not present

## 2015-01-01 DIAGNOSIS — I480 Paroxysmal atrial fibrillation: Secondary | ICD-10-CM

## 2015-01-01 DIAGNOSIS — I059 Rheumatic mitral valve disease, unspecified: Secondary | ICD-10-CM

## 2015-01-01 LAB — POCT INR: INR: 1.9

## 2015-01-15 ENCOUNTER — Other Ambulatory Visit: Payer: Self-pay | Admitting: *Deleted

## 2015-01-15 ENCOUNTER — Ambulatory Visit (INDEPENDENT_AMBULATORY_CARE_PROVIDER_SITE_OTHER): Payer: Medicare Other

## 2015-01-15 ENCOUNTER — Other Ambulatory Visit: Payer: Self-pay | Admitting: Cardiology

## 2015-01-15 DIAGNOSIS — Z5181 Encounter for therapeutic drug level monitoring: Secondary | ICD-10-CM

## 2015-01-15 DIAGNOSIS — Z7901 Long term (current) use of anticoagulants: Secondary | ICD-10-CM | POA: Diagnosis not present

## 2015-01-15 DIAGNOSIS — I480 Paroxysmal atrial fibrillation: Secondary | ICD-10-CM

## 2015-01-15 DIAGNOSIS — I4891 Unspecified atrial fibrillation: Secondary | ICD-10-CM

## 2015-01-15 DIAGNOSIS — I059 Rheumatic mitral valve disease, unspecified: Secondary | ICD-10-CM | POA: Diagnosis not present

## 2015-01-15 LAB — POCT INR: INR: 1.8

## 2015-01-15 MED ORDER — WARFARIN SODIUM 5 MG PO TABS: ORAL_TABLET | ORAL | Status: DC

## 2015-01-31 ENCOUNTER — Ambulatory Visit (INDEPENDENT_AMBULATORY_CARE_PROVIDER_SITE_OTHER): Payer: Medicare Other | Admitting: *Deleted

## 2015-01-31 DIAGNOSIS — I255 Ischemic cardiomyopathy: Secondary | ICD-10-CM | POA: Diagnosis not present

## 2015-01-31 DIAGNOSIS — I5022 Chronic systolic (congestive) heart failure: Secondary | ICD-10-CM

## 2015-01-31 NOTE — Progress Notes (Signed)
Remote ICD transmission.   

## 2015-02-06 ENCOUNTER — Ambulatory Visit (INDEPENDENT_AMBULATORY_CARE_PROVIDER_SITE_OTHER): Payer: Medicare Other | Admitting: *Deleted

## 2015-02-06 DIAGNOSIS — Z5181 Encounter for therapeutic drug level monitoring: Secondary | ICD-10-CM

## 2015-02-06 DIAGNOSIS — I480 Paroxysmal atrial fibrillation: Secondary | ICD-10-CM

## 2015-02-06 DIAGNOSIS — Z7901 Long term (current) use of anticoagulants: Secondary | ICD-10-CM

## 2015-02-06 DIAGNOSIS — I059 Rheumatic mitral valve disease, unspecified: Secondary | ICD-10-CM | POA: Diagnosis not present

## 2015-02-06 DIAGNOSIS — I4891 Unspecified atrial fibrillation: Secondary | ICD-10-CM | POA: Diagnosis not present

## 2015-02-06 LAB — POCT INR: INR: 2.7

## 2015-02-09 ENCOUNTER — Other Ambulatory Visit: Payer: Self-pay | Admitting: *Deleted

## 2015-02-09 MED ORDER — ISOSORBIDE MONONITRATE ER 30 MG PO TB24: 30.0000 mg | ORAL_TABLET | Freq: Every day | ORAL | Status: DC

## 2015-02-15 ENCOUNTER — Other Ambulatory Visit: Payer: Self-pay | Admitting: Family Medicine

## 2015-02-20 LAB — CUP PACEART REMOTE DEVICE CHECK
Brady Statistic AP VP Percent: 38 %
Brady Statistic AP VS Percent: 1 %
Brady Statistic RA Percent Paced: 37 %
Date Time Interrogation Session: 20161228090027
Implantable Lead Implant Date: 20070212
Implantable Lead Model: 157
Lead Channel Setting Pacing Amplitude: 2.5 V
Lead Channel Setting Pacing Pulse Width: 1 ms
MDC IDC LEAD IMPLANT DT: 20100927
MDC IDC LEAD IMPLANT DT: 20100927
MDC IDC LEAD LOCATION: 753858
MDC IDC LEAD LOCATION: 753859
MDC IDC LEAD LOCATION: 753860
MDC IDC LEAD SERIAL: 132614
MDC IDC MSMT BATTERY REMAINING LONGEVITY: 49 mo
MDC IDC MSMT BATTERY REMAINING PERCENTAGE: 74 %
MDC IDC MSMT BATTERY VOLTAGE: 2.96 V
MDC IDC SET LEADCHNL RA PACING AMPLITUDE: 2 V
MDC IDC SET LEADCHNL RV PACING AMPLITUDE: 2.5 V
MDC IDC SET LEADCHNL RV PACING PULSEWIDTH: 0.5 ms
MDC IDC SET LEADCHNL RV SENSING SENSITIVITY: 2 mV
MDC IDC STAT BRADY AS VP PERCENT: 60 %
MDC IDC STAT BRADY AS VS PERCENT: 1 %
Pulse Gen Serial Number: 7183458

## 2015-02-21 ENCOUNTER — Encounter: Payer: Self-pay | Admitting: Cardiology

## 2015-02-22 ENCOUNTER — Other Ambulatory Visit: Payer: Self-pay | Admitting: Cardiology

## 2015-02-27 ENCOUNTER — Encounter: Payer: Self-pay | Admitting: Family Medicine

## 2015-02-27 ENCOUNTER — Telehealth: Payer: Self-pay | Admitting: Family Medicine

## 2015-02-27 ENCOUNTER — Ambulatory Visit (INDEPENDENT_AMBULATORY_CARE_PROVIDER_SITE_OTHER): Payer: Medicare Other | Admitting: Family Medicine

## 2015-02-27 VITALS — BP 102/50 | HR 61 | Temp 97.8°F | Ht 73.0 in | Wt 194.3 lb

## 2015-02-27 DIAGNOSIS — J069 Acute upper respiratory infection, unspecified: Secondary | ICD-10-CM

## 2015-02-27 MED ORDER — BENZONATATE 100 MG PO CAPS: 100.0000 mg | ORAL_CAPSULE | Freq: Two times a day (BID) | ORAL | Status: DC | PRN

## 2015-02-27 NOTE — Progress Notes (Signed)
HPI:  Acute visit for:  Nasal congestion -started: 4 days ago -symptoms:nasal congestion, scratchy throat, cough -denies:fever, SOB, NVD, tooth pain, sinus pain -has tried: musinex and airborne -sick contacts/travel/risks: denies flu exposure, wife has a cold  ROS: See pertinent positives and negatives per HPI.  Past Medical History  Diagnosis Date  . HYPERLIPIDEMIA 04/07/2008  . HYPERTENSION 04/07/2008  . CAD, ARTERY BYPASS GRAFT 05/22/2008  . CARDIOMYOPATHY, ISCHEMIC 05/22/2008  . MITRAL VALVE PROLAPSE, NON-RHEUMATIC 05/22/2008  . AV BLOCK 05/22/2008  . Chronic systolic heart failure ([place]) 10/13/2008  . CAROTID ARTERY STENOSIS 11/15/2009  . GERD 04/07/2008  . RENAL INSUFFICIENCY 10/17/2008  . DEGENERATIVE JOINT DISEASE, LEFT KNEE 04/07/2008  . COLONIC POLYPS, HX OF 04/07/2008  . DIVERTICULITIS, HX OF 04/07/2008  . Atrial fibrillation ([place]) 04/04/2010  . Decreased left ventricular function   . Ventricular tachycardia ([place])     Status post catheter ablation may 2013  . CHF (congestive heart failure) ([facility])     class 2 to 3    Past Surgical History  Procedure Laterality Date  . Cardiac surgery      open heart 1991, 1997, pacemaker insertion [place] 2010 CD 3231  . Lung surgery  1987  . Hernia repair  1952    2004  . Cholecystectomy    . Appendectomy    . Thoracotomy      secondary to lung mass  . Insert / replace / remove pacemaker      St Jude unify CD 3231/2010  . Colonoscopy N/A 08/26/2013    Procedure: COLONOSCOPY;  Surgeon: Beryle Beams, MD;  Location: [facility];  Service: Endoscopy;  Laterality: N/A;  . Left heart catheterization with coronary/graft angiogram N/A 04/30/2011    Procedure: LEFT HEART CATHETERIZATION WITH Beatrix Fetters;  Surgeon: Hillary Bow, MD;  Location: [facility] CATH LAB;  Service: Cardiovascular;  Laterality: N/A;  . V-tach ablation N/A 06/26/2011    Procedure: V-TACH ABLATION;  Surgeon: Thompson Grayer, MD;  Location: [facility] CATH LAB;  Service:  Cardiovascular;  Laterality: N/A;  . Implantable cardioverter defibrillator generator change N/A 07/15/2013    Procedure: IMPLANTABLE CARDIOVERTER DEFIBRILLATOR GENERATOR CHANGE;  Surgeon: Deboraha Sprang, MD;  Location: [facility] CATH LAB;  Service: Cardiovascular;  Laterality: N/A;    Family History  Problem Relation Age of Onset  . Heart disease Mother     CAD, deceased   . Coronary artery disease Mother   . Heart disease Sister   . Heart disease Brother     Social History   Social History  . Marital Status: Married    Spouse Name: N/A  . Number of Children: 2  . Years of Education: N/A   Occupational History  .     Social History Main Topics  . Smoking status: Former Smoker -- 0.50 packs/day for 30 years    Types: Cigarettes    Quit date: 04/24/1972  . Smokeless tobacco: Never Used  . Alcohol Use: No  . Drug Use: No  . Sexual Activity: Not Asked   Other Topics Concern  . None   Social History Narrative     Current outpatient prescriptions:  .  atorvastatin (LIPITOR) 20 MG tablet, TAKE 1 TABLET BY MOUTH EVERY DAY, Disp: 30 tablet, Rfl: 11 .  carvedilol (COREG) 25 MG tablet, TAKE 1 TABLET BY MOUTH TWICE DAILY, Disp: 180 tablet, Rfl: 3 .  diphenhydramine-acetaminophen (TYLENOL PM) 25-500 MG TABS, Take 1 tablet by mouth at bedtime as needed (sleep). , Disp: ,  Rfl:  .  docusate sodium (COLACE) 100 MG capsule, Take 100 mg by mouth daily as needed. For constipation, Disp: , Rfl:  .  donepezil (ARICEPT) 10 MG tablet, Take 1 tablet (10 mg total) by mouth at bedtime., Disp: 90 tablet, Rfl: 3 .  donepezil (ARICEPT) 5 MG tablet, TAKE 1 TABLET(5 MG) BY MOUTH AT BEDTIME, Disp: 90 tablet, Rfl: 2 .  fenofibrate 160 MG tablet, Take 1 tablet (160 mg total) by mouth daily., Disp: 60 tablet, Rfl: 5 .  fenofibrate 160 MG tablet, TAKE 1 TABLET BY MOUTH EVERY DAY, Disp: 60 tablet, Rfl: 5 .  furosemide (LASIX) 40 MG tablet, TAKE 2 TABLETS BY MOUTH EVERY MORNING AND 1 TABLET BY MOUTH EVERY  AFTERNOON, Disp: 270 tablet, Rfl: 0 .  isosorbide mononitrate (IMDUR) 30 MG 24 hr tablet, Take 1 tablet (30 mg total) by mouth daily. With 60mg  tablet to equal 90mg  daily, Disp: 90 tablet, Rfl: 1 .  isosorbide mononitrate (IMDUR) 60 MG 24 hr tablet, Take 1 tablet (60 mg total) by mouth daily., Disp: 90 tablet, Rfl: 1 .  losartan (COZAAR) 50 MG tablet, TAKE 1 TABLET BY MOUTH EVERY DAY, Disp: 90 tablet, Rfl: 0 .  nitroGLYCERIN (NITROSTAT) 0.4 MG SL tablet, PLACE 1 TABLET UNDER THE TONGUE EVERY 5 MINUTES AS NEEDED FOR CHEST PAIN, Disp: 100 tablet, Rfl: 0 .  omeprazole (PRILOSEC) 20 MG capsule, TAKE ONE CAPSULE BY MOUTH TWICE DAILY, Disp: 60 capsule, Rfl: 5 .  potassium chloride SA (K-DUR,KLOR-CON) 20 MEQ tablet, TAKE 1 TABLET BY MOUTH TWICE DAILY, Disp: 60 tablet, Rfl: 5 .  Tamsulosin HCl (FLOMAX) 0.4 MG CAPS, Take 0.4 mg by mouth daily. , Disp: , Rfl:  .  triamcinolone cream (KENALOG) 0.1 %, Apply 1 application topically as needed (for rash). , Disp: , Rfl:  .  vitamin B-12 (CYANOCOBALAMIN) 500 MCG tablet, Take 500 mcg by mouth daily.  , Disp: , Rfl:  .  warfarin (COUMADIN) 5 MG tablet, TAKE AS DIRECTED BY COUMADIN CLINIC, Disp: 90 tablet, Rfl: 1 .  [DISCONTINUED] triamcinolone (NASACORT AQ) 55 MCG/ACT nasal inhaler, Place 2 sprays into the nose as needed.  , Disp: , Rfl:   EXAM:  Filed Vitals:   02/27/15 0928  BP: 102/50  Pulse: 61  Temp: 97.8 F (36.6 C)    Body mass index is 25.64 kg/(m^2).  GENERAL: vitals reviewed and listed above, alert, oriented, appears well hydrated and in no acute distress  HEENT: atraumatic, conjunttiva clear, no obvious abnormalities on inspection of external nose and ears, normal appearance of ear canals and TMs, clear nasal congestion, mild post oropharyngeal erythema with PND, no tonsillar edema or exudate, no sinus TTP  NECK: no obvious masses on inspection  LUNGS: clear to auscultation bilaterally, no wheezes, rales or rhonchi, good air movement  CV:  HRRR, no peripheral edema  MS: moves all extremities without noticeable abnormality  PSYCH: pleasant and cooperative, no obvious depression or anxiety  ASSESSMENT AND PLAN:  Discussed the following assessment and plan:  Common Cold - Acute upper respiratory infection  -given HPI and exam findings today, a serious infection or illness is unlikely. We discussed potential etiologies, with VURI being most likely, and advised supportive care and monitoring. We discussed treatment side effects, likely course, antibiotic misuse, transmission, and signs of developing a serious illness. -of course, we advised to return or notify a doctor immediately if symptoms worsen or persist or new concerns arise.    Patient Instructions  INSTRUCTIONS FOR UPPER RESPIRATORY  INFECTION:  -plenty of rest and fluids  -nasal saline wash 2-3 times daily (use prepackaged nasal saline or bottled/distilled water if making your own)   -can use AFRIN nasal spray for drainage and nasal congestion - but do NOT use longer then 3-4 days  -can use tylenol (in no history of liver disease) ) as directed for aches and sorethroat  -in the winter time, using a humidifier at night is helpful (please follow cleaning instructions)  -if you are taking a cough medication - use only as directed, may also try a teaspoon of honey to coat the throat and throat lozenges. If given a cough medication with codeine or hydrocodone or other narcotic please be advised that this contains a strong and  potentially addicting medication. Please follow instructions carefully, take as little as possible and only use AS NEEDED for severe cough. Discuss potential side effects with your pharmacy. Please do not drive or operate machinery while taking these types of medications. Please do not take other sedating medications, drugs or alcohol while taking this medication without discussing with your doctor.  -for sore throat, salt water gargles can  help  -follow up if you have fevers, facial pain, tooth pain, difficulty breathing or are worsening or symptoms persist longer then expected  Upper Respiratory Infection, Adult An upper respiratory infection (URI) is also known as the common cold. It is often caused by a type of germ (virus). Colds are easily spread (contagious). You can pass it to others by kissing, coughing, sneezing, or drinking out of the same glass. Usually, you get better in 1 to 3  weeks.  However, the cough can last for even longer. HOME CARE   Only take medicine as told by your doctor. Follow instructions provided above.  Drink enough water and fluids to keep your pee (urine) clear or pale yellow.  Get plenty of rest.  Return to work when your temperature is < 100 for 24 hours or as told by your doctor. You may use a face mask and wash your hands to stop your cold from spreading. GET HELP RIGHT AWAY IF:   After the first few days, you feel you are getting worse.  You have questions about your medicine.  You have chills, shortness of breath, or red spit (mucus).  You have pain in the face for more then 1-2 days, especially when you bend forward.  You have a fever, puffy (swollen) neck, pain when you swallow, or white spots in the back of your throat.  You have a bad headache, ear pain, sinus pain, or chest pain.  You have a high-pitched whistling sound when you breathe in and out (wheezing).  You cough up blood.  You have sore muscles or a stiff neck. MAKE SURE YOU:   Understand these instructions.  Will watch your condition.  Will get help right away if you are not doing well or get worse. Document Released: 07/09/2007 Document Revised: 04/14/2011 Document Reviewed: 04/27/2013 [facility] Patient Information 2015 [place], [place]. This information is not intended to replace advice given to you by your health care provider. Make sure you discuss any questions you have with your health care  provider.      Colin Benton R.

## 2015-02-27 NOTE — Patient Instructions (Signed)

## 2015-02-27 NOTE — Progress Notes (Signed)
Pre visit review using our clinic review tool, if applicable. No additional management support is needed unless otherwise documented below in the visit note. 

## 2015-02-27 NOTE — Telephone Encounter (Signed)
[facility] - Client [place] [facility]  Patient Name: Sean [name]  DOB: 05/13/1931    Initial Comment caller states her husband has a cold and is coughing up green phlegm - would like rx for cough medicine   Nurse Assessment  Nurse: Wynetta Emery, RN, Baker Janus Date/Time Eilene Ghazi Time): 02/27/2015 8:38:04 AM  Confirm and document reason for call. If symptomatic, describe symptoms. You must click the next button to save text entered. ---[name] has cough and congestion fever coughing up green mucus. onset Saturday  Has the patient traveled out of the country within the last 30 days? ---No  Does the patient have any new or worsening symptoms? ---Yes  Will a triage be completed? ---Yes  Related visit to physician within the last 2 weeks? ---No  Does the PT have any chronic conditions? (i.e. diabetes, asthma, etc.) ---Unknown  Is this a behavioral health or substance abuse call? ---No     Guidelines    Guideline Title Affirmed Question Affirmed Notes  Cough - Acute Productive Wheezing is present    Final Disposition User   See Physician within 4 Hours (or PCP triage) Wynetta Emery, RN, Baker Janus    Comments  NOTE: No appt for Dr. Elease Hashimoto needs seen today given appt with Dr. Maudie Mercury at 945am 1/24-2017   Referrals  REFERRED TO PCP OFFICE   Disagree/Comply: Leta Baptist

## 2015-03-03 ENCOUNTER — Other Ambulatory Visit: Payer: Self-pay | Admitting: Cardiology

## 2015-03-05 ENCOUNTER — Other Ambulatory Visit: Payer: Self-pay | Admitting: Cardiology

## 2015-03-05 ENCOUNTER — Ambulatory Visit (INDEPENDENT_AMBULATORY_CARE_PROVIDER_SITE_OTHER): Payer: Medicare Other | Admitting: *Deleted

## 2015-03-05 DIAGNOSIS — Z7901 Long term (current) use of anticoagulants: Secondary | ICD-10-CM

## 2015-03-05 DIAGNOSIS — I4891 Unspecified atrial fibrillation: Secondary | ICD-10-CM

## 2015-03-05 DIAGNOSIS — Z5181 Encounter for therapeutic drug level monitoring: Secondary | ICD-10-CM

## 2015-03-05 DIAGNOSIS — I480 Paroxysmal atrial fibrillation: Secondary | ICD-10-CM

## 2015-03-05 DIAGNOSIS — I059 Rheumatic mitral valve disease, unspecified: Secondary | ICD-10-CM

## 2015-03-08 ENCOUNTER — Other Ambulatory Visit: Payer: Self-pay | Admitting: Internal Medicine

## 2015-03-15 ENCOUNTER — Emergency Department (HOSPITAL_COMMUNITY): Payer: Medicare Other

## 2015-03-15 ENCOUNTER — Inpatient Hospital Stay (HOSPITAL_COMMUNITY)
Admission: EM | Admit: 2015-03-15 | Discharge: 2015-03-20 | DRG: 286 | Disposition: A | Discharge status: Home / Self Care | Payer: Medicare Other | Attending: Cardiology | Admitting: Cardiology

## 2015-03-15 ENCOUNTER — Encounter (HOSPITAL_COMMUNITY): Payer: Self-pay | Admitting: *Deleted

## 2015-03-15 DIAGNOSIS — I10 Essential (primary) hypertension: Secondary | ICD-10-CM | POA: Diagnosis not present

## 2015-03-15 DIAGNOSIS — I255 Ischemic cardiomyopathy: Secondary | ICD-10-CM | POA: Diagnosis present

## 2015-03-15 DIAGNOSIS — I2582 Chronic total occlusion of coronary artery: Secondary | ICD-10-CM | POA: Diagnosis present

## 2015-03-15 DIAGNOSIS — N184 Chronic kidney disease, stage 4 (severe): Secondary | ICD-10-CM | POA: Diagnosis present

## 2015-03-15 DIAGNOSIS — E785 Hyperlipidemia, unspecified: Secondary | ICD-10-CM | POA: Diagnosis present

## 2015-03-15 DIAGNOSIS — Z91048 Other nonmedicinal substance allergy status: Secondary | ICD-10-CM | POA: Diagnosis not present

## 2015-03-15 DIAGNOSIS — I25710 Atherosclerosis of autologous vein coronary artery bypass graft(s) with unstable angina pectoris: Secondary | ICD-10-CM | POA: Diagnosis present

## 2015-03-15 DIAGNOSIS — I701 Atherosclerosis of renal artery: Secondary | ICD-10-CM | POA: Diagnosis present

## 2015-03-15 DIAGNOSIS — Z8249 Family history of ischemic heart disease and other diseases of the circulatory system: Secondary | ICD-10-CM

## 2015-03-15 DIAGNOSIS — Z888 Allergy status to other drugs, medicaments and biological substances status: Secondary | ICD-10-CM | POA: Diagnosis not present

## 2015-03-15 DIAGNOSIS — I774 Celiac artery compression syndrome: Secondary | ICD-10-CM | POA: Diagnosis present

## 2015-03-15 DIAGNOSIS — I480 Paroxysmal atrial fibrillation: Secondary | ICD-10-CM | POA: Diagnosis present

## 2015-03-15 DIAGNOSIS — I340 Nonrheumatic mitral (valve) insufficiency: Secondary | ICD-10-CM | POA: Diagnosis present

## 2015-03-15 DIAGNOSIS — I130 Hypertensive heart and chronic kidney disease with heart failure and stage 1 through stage 4 chronic kidney disease, or unspecified chronic kidney disease: Secondary | ICD-10-CM | POA: Diagnosis present

## 2015-03-15 DIAGNOSIS — I472 Ventricular tachycardia, unspecified: Secondary | ICD-10-CM

## 2015-03-15 DIAGNOSIS — J189 Pneumonia, unspecified organism: Secondary | ICD-10-CM | POA: Diagnosis present

## 2015-03-15 DIAGNOSIS — Z87891 Personal history of nicotine dependence: Secondary | ICD-10-CM | POA: Diagnosis not present

## 2015-03-15 DIAGNOSIS — Z86718 Personal history of other venous thrombosis and embolism: Secondary | ICD-10-CM

## 2015-03-15 DIAGNOSIS — M5440 Lumbago with sciatica, unspecified side: Secondary | ICD-10-CM | POA: Diagnosis present

## 2015-03-15 DIAGNOSIS — K219 Gastro-esophageal reflux disease without esophagitis: Secondary | ICD-10-CM | POA: Diagnosis present

## 2015-03-15 DIAGNOSIS — F0390 Unspecified dementia without behavioral disturbance: Secondary | ICD-10-CM | POA: Diagnosis present

## 2015-03-15 DIAGNOSIS — I6501 Occlusion and stenosis of right vertebral artery: Secondary | ICD-10-CM | POA: Diagnosis present

## 2015-03-15 DIAGNOSIS — Z880 Allergy status to penicillin: Secondary | ICD-10-CM

## 2015-03-15 DIAGNOSIS — Z95810 Presence of automatic (implantable) cardiac defibrillator: Secondary | ICD-10-CM | POA: Diagnosis not present

## 2015-03-15 DIAGNOSIS — I209 Angina pectoris, unspecified: Secondary | ICD-10-CM

## 2015-03-15 DIAGNOSIS — Z7901 Long term (current) use of anticoagulants: Secondary | ICD-10-CM

## 2015-03-15 DIAGNOSIS — R079 Chest pain, unspecified: Secondary | ICD-10-CM | POA: Diagnosis not present

## 2015-03-15 DIAGNOSIS — I200 Unstable angina: Secondary | ICD-10-CM | POA: Diagnosis present

## 2015-03-15 DIAGNOSIS — N183 Chronic kidney disease, stage 3 unspecified: Secondary | ICD-10-CM | POA: Diagnosis present

## 2015-03-15 DIAGNOSIS — I341 Nonrheumatic mitral (valve) prolapse: Secondary | ICD-10-CM | POA: Diagnosis present

## 2015-03-15 DIAGNOSIS — I25700 Atherosclerosis of coronary artery bypass graft(s), unspecified, with unstable angina pectoris: Secondary | ICD-10-CM | POA: Insufficient documentation

## 2015-03-15 DIAGNOSIS — I25110 Atherosclerotic heart disease of native coronary artery with unstable angina pectoris: Secondary | ICD-10-CM | POA: Diagnosis present

## 2015-03-15 DIAGNOSIS — I5043 Acute on chronic combined systolic (congestive) and diastolic (congestive) heart failure: Secondary | ICD-10-CM | POA: Diagnosis present

## 2015-03-15 DIAGNOSIS — R9389 Abnormal findings on diagnostic imaging of other specified body structures: Secondary | ICD-10-CM | POA: Diagnosis present

## 2015-03-15 DIAGNOSIS — I5042 Chronic combined systolic (congestive) and diastolic (congestive) heart failure: Secondary | ICD-10-CM | POA: Diagnosis present

## 2015-03-15 DIAGNOSIS — I6523 Occlusion and stenosis of bilateral carotid arteries: Secondary | ICD-10-CM | POA: Diagnosis present

## 2015-03-15 HISTORY — DX: Reserved for inherently not codable concepts without codable children: IMO0001

## 2015-03-15 LAB — COMPREHENSIVE METABOLIC PANEL WITH GFR
ALT: 18 U/L (ref 17–63)
AST: 30 U/L (ref 15–41)
Albumin: 3.5 g/dL (ref 3.5–5.0)
Alkaline Phosphatase: 30 U/L — ABNORMAL LOW (ref 38–126)
Anion gap: 13 (ref 5–15)
BUN: 41 mg/dL — ABNORMAL HIGH (ref 6–20)
CO2: 26 mmol/L (ref 22–32)
Calcium: 9.6 mg/dL (ref 8.9–10.3)
Chloride: 103 mmol/L (ref 101–111)
Creatinine, Ser: 2.4 mg/dL — ABNORMAL HIGH (ref 0.61–1.24)
GFR calc Af Amer: 28 mL/min — ABNORMAL LOW
GFR calc non Af Amer: 24 mL/min — ABNORMAL LOW
Glucose, Bld: 110 mg/dL — ABNORMAL HIGH (ref 65–99)
Potassium: 4 mmol/L (ref 3.5–5.1)
Sodium: 142 mmol/L (ref 135–145)
Total Bilirubin: 0.9 mg/dL (ref 0.3–1.2)
Total Protein: 6.9 g/dL (ref 6.5–8.1)

## 2015-03-15 LAB — DIFFERENTIAL
BASOS ABS: 0 10*3/uL (ref 0.0–0.1)
BASOS PCT: 1 %
EOS ABS: 0.2 10*3/uL (ref 0.0–0.7)
Eosinophils Relative: 3 %
LYMPHS ABS: 1.7 10*3/uL (ref 0.7–4.0)
Lymphocytes Relative: 28 %
MONOS PCT: 16 %
Monocytes Absolute: 1 10*3/uL (ref 0.1–1.0)
NEUTROS ABS: 3.1 10*3/uL (ref 1.7–7.7)
NEUTROS PCT: 52 %

## 2015-03-15 LAB — PROTIME-INR
INR: 2.69 — ABNORMAL HIGH (ref 0.00–1.49)
INR: 2.52 — AB (ref 0.00–1.49)
Prothrombin Time: 28.2 seconds — ABNORMAL HIGH (ref 11.6–15.2)
Prothrombin Time: 26.8 seconds — ABNORMAL HIGH (ref 11.6–15.2)

## 2015-03-15 LAB — TROPONIN I
TROPONIN I: 0.06 ng/mL — AB (ref <0.031)
Troponin I: 0.06 ng/mL — ABNORMAL HIGH (ref <0.031)
Troponin I: 0.06 ng/mL — ABNORMAL HIGH (ref <0.031)

## 2015-03-15 LAB — CBC
HEMATOCRIT: 38.7 % — AB (ref 39.0–52.0)
HEMOGLOBIN: 12.7 g/dL — AB (ref 13.0–17.0)
MCH: 28.3 pg (ref 26.0–34.0)
MCHC: 32.8 g/dL (ref 30.0–36.0)
MCV: 86.4 fL (ref 78.0–100.0)
Platelets: 162 10*3/uL (ref 150–400)
RBC: 4.48 MIL/uL (ref 4.22–5.81)
RDW: 14.5 % (ref 11.5–15.5)
WBC: 5.9 10*3/uL (ref 4.0–10.5)

## 2015-03-15 LAB — I-STAT TROPONIN, ED
Troponin i, poc: 0.03 ng/mL (ref 0.00–0.08)
Troponin i, poc: 0.06 ng/mL (ref 0.00–0.08)

## 2015-03-15 LAB — BRAIN NATRIURETIC PEPTIDE: B Natriuretic Peptide: 441.9 pg/mL — ABNORMAL HIGH (ref 0.0–100.0)

## 2015-03-15 LAB — TSH: TSH: 1.239 u[IU]/mL (ref 0.350–4.500)

## 2015-03-15 MED ORDER — ISOSORBIDE MONONITRATE ER 30 MG PO TB24
30.0000 mg | ORAL_TABLET | Freq: Every day | ORAL | Status: DC
Start: 2015-03-15 — End: 2015-03-15

## 2015-03-15 MED ORDER — DONEPEZIL HCL 10 MG PO TABS
10.0000 mg | ORAL_TABLET | Freq: Every day | ORAL | Status: DC
  Administered 2015-03-15 – 2015-03-19 (×5): 10 mg via ORAL
  Filled 2015-03-15 (×6): qty 1

## 2015-03-15 MED ORDER — ISOSORBIDE MONONITRATE ER 30 MG PO TB24
90.0000 mg | ORAL_TABLET | Freq: Every day | ORAL | Status: DC
  Administered 2015-03-15 – 2015-03-20 (×6): 90 mg via ORAL
  Filled 2015-03-15 (×6): qty 1

## 2015-03-15 MED ORDER — SODIUM CHLORIDE 0.9% FLUSH: 3.0000 mL | INTRAVENOUS | Status: DC | PRN

## 2015-03-15 MED ORDER — PANTOPRAZOLE SODIUM 40 MG PO TBEC
40.0000 mg | DELAYED_RELEASE_TABLET | Freq: Every day | ORAL | Status: DC
  Administered 2015-03-15 – 2015-03-20 (×6): 40 mg via ORAL
  Filled 2015-03-15 (×6): qty 1

## 2015-03-15 MED ORDER — ACETAMINOPHEN 325 MG PO TABS: 650.0000 mg | ORAL_TABLET | ORAL | Status: DC | PRN

## 2015-03-15 MED ORDER — FUROSEMIDE 80 MG PO TABS
80.0000 mg | ORAL_TABLET | Freq: Every morning | ORAL | Status: DC
  Administered 2015-03-15 – 2015-03-19 (×5): 80 mg via ORAL
  Filled 2015-03-15 (×5): qty 1

## 2015-03-15 MED ORDER — ATORVASTATIN CALCIUM 20 MG PO TABS
20.0000 mg | ORAL_TABLET | Freq: Every day | ORAL | Status: DC
  Administered 2015-03-15 – 2015-03-19 (×5): 20 mg via ORAL
  Filled 2015-03-15 (×5): qty 1

## 2015-03-15 MED ORDER — ASPIRIN 300 MG RE SUPP: 300.0000 mg | RECTAL | Status: AC

## 2015-03-15 MED ORDER — SODIUM CHLORIDE 0.9% FLUSH
3.0000 mL | Freq: Two times a day (BID) | INTRAVENOUS | Status: DC
  Administered 2015-03-15 – 2015-03-17 (×5): 3 mL via INTRAVENOUS

## 2015-03-15 MED ORDER — ONDANSETRON HCL 4 MG/2ML IJ SOLN: 4.0000 mg | Freq: Four times a day (QID) | INTRAMUSCULAR | Status: DC | PRN

## 2015-03-15 MED ORDER — CARVEDILOL 25 MG PO TABS
25.0000 mg | ORAL_TABLET | Freq: Two times a day (BID) | ORAL | Status: DC
  Administered 2015-03-15 – 2015-03-20 (×11): 25 mg via ORAL
  Filled 2015-03-15 (×11): qty 1

## 2015-03-15 MED ORDER — SODIUM CHLORIDE 0.9 % IV SOLN: 250.0000 mL | INTRAVENOUS | Status: DC | PRN

## 2015-03-15 MED ORDER — FENOFIBRATE 160 MG PO TABS
160.0000 mg | ORAL_TABLET | Freq: Every day | ORAL | Status: DC
  Administered 2015-03-15 – 2015-03-20 (×6): 160 mg via ORAL
  Filled 2015-03-15 (×6): qty 1

## 2015-03-15 MED ORDER — NITROGLYCERIN 0.4 MG SL SUBL: 0.4000 mg | SUBLINGUAL_TABLET | SUBLINGUAL | Status: DC | PRN

## 2015-03-15 MED ORDER — ASPIRIN EC 81 MG PO TBEC
81.0000 mg | DELAYED_RELEASE_TABLET | Freq: Every day | ORAL | Status: DC
  Administered 2015-03-16 – 2015-03-20 (×4): 81 mg via ORAL
  Filled 2015-03-15 (×5): qty 1

## 2015-03-15 MED ORDER — ASPIRIN 81 MG PO CHEW
324.0000 mg | CHEWABLE_TABLET | ORAL | Status: AC
  Administered 2015-03-15: 324 mg via ORAL
  Filled 2015-03-15: qty 4

## 2015-03-15 MED ORDER — POTASSIUM CHLORIDE CRYS ER 20 MEQ PO TBCR
20.0000 meq | EXTENDED_RELEASE_TABLET | Freq: Two times a day (BID) | ORAL | Status: DC
  Administered 2015-03-15 – 2015-03-20 (×11): 20 meq via ORAL
  Filled 2015-03-15 (×11): qty 1

## 2015-03-15 NOTE — ED Notes (Signed)
Attempted report 

## 2015-03-15 NOTE — H&P (Signed)
Patient ID: Sean [name] MRN: [id number], DOB/AGE: [birth year]   Admit date: 03/15/2015  Consulting Physician: Gershon Mussel Clayton Primary Physician: Eulas Post, MD Primary Cardiologist: Dr. Aundra Dubin Dr. Caryl Comes (EP)  Reason for admission: chest pain  Pt. Profile:  Sean [name] is a 82 y.o. male with a history of CAD s/p CABG (1991 with redo in 1997) ischemic cardiomyopathy (EF 25-30%) s/p ICD--> CRT, CKD, mod MR, HTN, HLD, VT s/p ablation (2013), carotid artery stenosis and PAF on coumadin who presented to [facility] today with chest pain.  He had his last cardiac catheterization in 2013 after recurrent episodes of ventricular tachycardia. This revealed  [place] 2013 with SVG-OM patent and LIMA-LAD patent, other 3 SVGs closed.    Final Conclusions:  1. Continued patency of the LIMA to the LAD diagonal 2. Continued patency of the stent OM2 graft 3. Known occlusion of the three remaining SVG 4. Known occlusion of the RCA 5. Normal LVEDP  6. Mild moderate progression of the LMCA lesion----supplying a septal which itself has disease.  He was continued on medical therapy. Per Dr. Lia Foyer, PCI of LMCA could be considered, but did not have symptoms and was felt to be unlikely the source for recurrent shocks.  He had a nuclear stress test in 11/2013 which showed no significant ischemia. EF not evaluated due to study not being gated.    He has been noted have chronic stable angina with exertion in the past for which he has to take SL NTG 1-2 times a month. He was in his usual state of health until yesterday when he just didn't feel that good and laid down to rest. This morning, he got up to take the dogs out and got chest pain. This was unusual for him as he was walking on flat ground and not really exerting himself. Chest pain was described as someone standing on his chest. At its worst the pain was a 7 out of 10. It was associated with shortness of breath and nausea. No diaphoresis. It  was relieved after 2 SL nitroglycerin. He became worried and called EMS.   He denies lower extremity edema, orthopnea or PND. He often feels dizzy or "swimmy headed" but no syncope. He has had some lower extremity cramping that hurt when he tries to straighten his legs. He has also had a recent cough for which he got cough medicine. It is not a productive cough.   In the ER his ECG IS V paced with no acute ischemic changes. Troponin negative. BNP mildly elevated. CXR with possible PNA.  PMH; 1. History of DVT 2. HTN: ACEI cough. 3. Hyperlipidemia 4. CAD s/p CABG in 1991 with redo in 1997.  [place] 2013 with SVG-OM patent and LIMA-LAD patent, other 3 SVGs closed.  Chronic stable angina.   5. Ischemic cardiomyopathy: Echo in 4/13 with dilated LV, EF 30%, mildly dilated RV with mildly decreased RV function, moderate MR, mild AI.   - St Jude ICD with CRT upgrade in 9/10.   6. Carotid stenosis: 5/14 carotid dopplers with 40-59% bilateral ICA stenosis and occluded right vertebral.  5/15 carotid dopplers with 40-59% bilateral ICA stenosis.   7. GERD 8. CKD 9. OA 10. Paroxysmal atrial fibrillation on coumadin 11. Mitral regurgitation: Moderate on last echo. 12. VT: s/p ablation in 5/13.   13. H/o diverticulitis 14. H/o cholecystectomy 15. H/o appendectomy 16. Renal artery stenosis: Renal artery dopplers (5/14) with 60-99% proximal right renal artery stenosis as well as  severe celiac artery stenosis.   17. ABIs (12/14) were normal. 18. Low back pain with sciatica   Problem List  Past Medical History  Diagnosis Date  . HYPERLIPIDEMIA 04/07/2008  . HYPERTENSION 04/07/2008  . CAD, ARTERY BYPASS GRAFT 05/22/2008  . CARDIOMYOPATHY, ISCHEMIC 05/22/2008  . MITRAL VALVE PROLAPSE, NON-RHEUMATIC 05/22/2008  . AV BLOCK 05/22/2008  . Chronic systolic heart failure ([place]) 10/13/2008  . CAROTID ARTERY STENOSIS 11/15/2009  . GERD 04/07/2008  . RENAL INSUFFICIENCY 10/17/2008  . DEGENERATIVE JOINT DISEASE, LEFT KNEE  04/07/2008  . COLONIC POLYPS, HX OF 04/07/2008  . DIVERTICULITIS, HX OF 04/07/2008  . Atrial fibrillation ([place]) 04/04/2010  . Decreased left ventricular function   . Ventricular tachycardia ([place])     Status post catheter ablation may 2013  . CHF (congestive heart failure) (HCC)     class 2 to 3    Past Surgical History  Procedure Laterality Date  . Cardiac surgery      open heart 1991, 1997, pacemaker insertion [place] 2010 CD 3231  . Lung surgery  1987  . Hernia repair  1952    2004  . Cholecystectomy    . Appendectomy    . Thoracotomy      secondary to lung mass  . Insert / replace / remove pacemaker      St Jude unify CD 3231/2010  . Colonoscopy N/A 08/26/2013    Procedure: COLONOSCOPY;  Surgeon: Beryle Beams, MD;  Location: [facility] ENDOSCOPY;  Service: Endoscopy;  Laterality: N/A;  . Left heart catheterization with coronary/graft angiogram N/A 04/30/2011    Procedure: LEFT HEART CATHETERIZATION WITH Beatrix Fetters;  Surgeon: Hillary Bow, MD;  Location: [facility] CATH LAB;  Service: Cardiovascular;  Laterality: N/A;  . V-tach ablation N/A 06/26/2011    Procedure: V-TACH ABLATION;  Surgeon: Thompson Grayer, MD;  Location: [facility] CATH LAB;  Service: Cardiovascular;  Laterality: N/A;  . Implantable cardioverter defibrillator generator change N/A 07/15/2013    Procedure: IMPLANTABLE CARDIOVERTER DEFIBRILLATOR GENERATOR CHANGE;  Surgeon: Deboraha Sprang, MD;  Location: [facility] CATH LAB;  Service: Cardiovascular;  Laterality: N/A;     Allergies  Allergies  Allergen Reactions  . Carisoprodol     REACTION: rash  . Lisinopril     REACTION: cough  . Tape     Cloth Tape tears skin  . Zolpidem Tartrate Other (See Comments)    Altered mental status  . Penicillins Rash     Home Medications  Prior to Admission medications   Medication Sig Start Date End Date Taking? Authorizing Provider  atorvastatin (LIPITOR) 20 MG tablet TAKE 1 TABLET BY MOUTH EVERY DAY 04/25/14  Yes Larey Dresser, MD    carvedilol (COREG) 25 MG tablet TAKE 1 TABLET BY MOUTH TWICE DAILY 06/12/14  Yes Larey Dresser, MD  donepezil (ARICEPT) 10 MG tablet Take 1 tablet (10 mg total) by mouth at bedtime. 10/23/14  Yes Eulas Post, MD  fenofibrate 160 MG tablet Take 1 tablet (160 mg total) by mouth daily. 12/18/14  Yes Eulas Post, MD  furosemide (LASIX) 40 MG tablet TAKE 2 TABLETS BY MOUTH EVERY MORNING AND 1 TABLET BY MOUTH EVERY AFTERNOON 02/22/15  Yes Larey Dresser, MD  isosorbide mononitrate (IMDUR) 30 MG 24 hr tablet Take 1 tablet (30 mg total) by mouth daily. With 60mg  tablet to equal 90mg  daily 02/09/15  Yes Larey Dresser, MD  isosorbide mononitrate (IMDUR) 60 MG 24 hr tablet Take 1 tablet (60 mg total) by mouth  daily. Patient taking differently: Take 60 mg by mouth daily. Take with isosorbide 30 mg 08/18/14  Yes Larey Dresser, MD  losartan (COZAAR) 50 MG tablet Take 1 tablet (50 mg total) by mouth daily. Pt. Needs f/u appt. 03/05/15  Yes Larey Dresser, MD  nitroGLYCERIN (NITROSTAT) 0.4 MG SL tablet PLACE 1 TABLET UNDER THE TONGUE EVERY 5 MINUTES AS NEEDED FOR CHEST PAIN 11/15/14  Yes Larey Dresser, MD  omeprazole (PRILOSEC) 20 MG capsule TAKE ONE CAPSULE BY MOUTH TWICE DAILY 10/16/14  Yes Eulas Post, MD  potassium chloride SA (K-DUR,KLOR-CON) 20 MEQ tablet TAKE 1 TABLET BY MOUTH TWICE DAILY 03/08/15  Yes Deboraha Sprang, MD  warfarin (COUMADIN) 5 MG tablet Take 2.5-5 mg by mouth daily. Take 5 mg on Monday and Friday then take 2.5 mg all the other days   Yes Historical Provider, MD  benzonatate (TESSALON) 100 MG capsule Take 1 capsule (100 mg total) by mouth 2 (two) times daily as needed for cough. Patient not taking: Reported on 03/15/2015 02/27/15   Lucretia Kern, DO  donepezil (ARICEPT) 5 MG tablet TAKE 1 TABLET(5 MG) BY MOUTH AT BEDTIME Patient not taking: Reported on 03/15/2015 12/25/14   Eulas Post, MD  fenofibrate 160 MG tablet TAKE 1 TABLET BY MOUTH EVERY DAY Patient not taking:  Reported on 03/15/2015 02/15/15   Eulas Post, MD  warfarin (COUMADIN) 5 MG tablet TAKE AS DIRECTED BY COUMADIN CLINIC Patient not taking: Reported on 03/15/2015 01/15/15   Larey Dresser, MD    Family History  Family History  Problem Relation Age of Onset  . Heart disease Mother     CAD, deceased   . Coronary artery disease Mother   . Heart disease Sister   . Heart disease Brother    Family Status  Relation Status Death Age  . Mother Deceased 41    CAD  . Father Deceased     deceased age 68, sepsis  . Paternal Grandfather Deceased   . Paternal Grandmother Deceased   . Maternal Grandfather Deceased   . Maternal Grandmother Deceased      Social History  Social History   Social History  . Marital Status: Married    Spouse Name: N/A  . Number of Children: 2  . Years of Education: N/A   Occupational History  .     Social History Main Topics  . Smoking status: Former Smoker -- 0.50 packs/day for 30 years    Types: Cigarettes    Quit date: 04/24/1972  . Smokeless tobacco: Never Used  . Alcohol Use: No  . Drug Use: No  . Sexual Activity: Not on file   Other Topics Concern  . Not on file   Social History Narrative      All other systems reviewed and are otherwise negative except as noted above.  Physical Exam  Blood pressure 108/61, pulse 60, temperature 97.7 F (36.5 C), temperature source Oral, resp. rate 22, height 6\' 1"  (1.854 m), weight 185 lb (83.915 kg), SpO2 96 %.  General: Pleasant, NAD Psych: Normal affect. Neuro: Alert and oriented X 3. Moves all extremities spontaneously. HEENT: Normal  Neck: Supple without bruits or JVD. Lungs:  Resp regular and unlabored, CTA. Heart: RRR no s3, s4, or + murmur. Abdomen: Soft, non-tender, non-distended, BS + x 4.  Extremities: No clubbing, cyanosis or edema. DP/PT/Radials 2+ and equal bilaterally.  Labs  No results for input(s): CKTOTAL, CKMB, TROPONINI in the last 72 hours.  Lab Results  Component  Value Date   WBC 5.9 03/15/2015   HGB 12.7* 03/15/2015   HCT 38.7* 03/15/2015   MCV 86.4 03/15/2015   PLT 162 03/15/2015    Recent Labs Lab 03/15/15 0506  NA 142  K 4.0  CL 103  CO2 26  BUN 41*  CREATININE 2.40*  CALCIUM 9.6  PROT 6.9  BILITOT 0.9  ALKPHOS 30*  ALT 18  AST 30  GLUCOSE 110*   Lab Results  Component Value Date   CHOL 163 03/02/2014   HDL 31.80* 03/02/2014   LDLCALC 74 04/22/2013   TRIG 251.0* 03/02/2014   No results found for: DDIMER   Radiology/Studies  Dg Chest Portable 1 View  03/15/2015  CLINICAL DATA:  Acute onset of bilateral arm pain. Mid chest pain. Chronic shortness of breath. Initial encounter. EXAM: PORTABLE CHEST 1 VIEW COMPARISON:  Chest radiograph performed 02/06/2009 FINDINGS: The lungs are well-aerated. Mild right midlung opacity may reflect atelectasis or possibly mild pneumonia. There is no evidence of pleural effusion or pneumothorax. The cardiomediastinal silhouette is borderline normal in size. The patient is status post median sternotomy. A pacemaker/AICD is noted overlying the right chest wall, with leads ending overlying the right atrium and right ventricle. No acute osseous abnormalities are seen. IMPRESSION: Mild right midlung opacity may reflect atelectasis or possibly mild pneumonia. Followup PA and lateral chest X-ray is recommended in 3-4 weeks following trial of antibiotic therapy to ensure resolution and exclude underlying malignancy. If the patient does not have symptoms of pneumonia, a PA view of the chest could be considered for further evaluation Electronically Signed   By: Garald Balding M.D.   On: 03/15/2015 05:32     Cardiac Catheterization Procedure Note  Name: Sean [name] MRN: [id number] DOB: [birth year]  Procedure: Left Heart Cath, Selective Coronary Angiography, SVG angiography, SLIMA angiography  Final Conclusions:  1. Continued patency of the LIMA to the LAD diagonal 2. Continued patency of the stent OM2  graft 3. Known occlusion of the three remaining SVG 4. Known occlusion of the RCA 5. Normal LVEDP on current regimen.  6. Mild moderate progression of the LMCA lesion----supplying a septal which itself has disease.  Recommendations:  1. Continue medical therapy. 2. Replace K 3. Discuss with Dr. Caryl Comes when he returns. Not much change in anatomy. PCI of LMCA could be considered, but he has had no symptoms and seems unlikely source for recurrent shocks.    ECG  AV paced  ASSESSMENT AND PLAN Sean [name] is a 82 y.o. male with a history of CAD s/p CABG (1991 with redo in 1997) ischemic cardiomyopathy (EF 25-30%) s/p ICD--> CRT, CKD, mod MR, HTN, HLD, VT s/p ablation (2013), carotid artery stenosis and PAF on coumadin who presented to [facility] today with chest pain.  CAD/chest pain concerning for [place]:  ECG is V paced with no acute ischemic changes. Troponin negative. Would not be a good candidate at this point with therapeutic INR at 2.6 and CKD. Creat 2.4 today.  Continue ASA, BB and statin. Will plan for LHC next Monday when INR falls to at least below 1.8. Will start heparin when INR <2. Will consult pharmacy. We'll continue daily Lasix for now but this will need to be held closer to cath to minimize kidney injury. Will hold ACE for now.   Chronic systolic CHF: EF [id number] on last 2-D echo in 08/2014. BNP is mildly elevated ~441. He does not appear volume overloaded to me. -- Continue  home lasix 80mg  AM and 40mg  PM for now as above. Continue Coreg 25mg  BID but will hold Losartan 50 mg daily.   CXR with possible PNA: he has had a recent cough, but non productive. His white count is normal and he is afebrile. Will hold off on treating this for now. Continue to monitor  PAF on coumadin: INR is 2.69. Will hold this for now, in case he needs a possible heart cath. I have asked pharmacy to start heparin when INR falls below 2. ECG is V paced.  CKD: creat 2.4 which appears slightly above  baseline. Continue to monitor. Holding ARB as above  HTN: Blood pressure well controlled currently. Monitor closely off losartan 50 mg daily.   Carotid stenosis: 06/2014: Stable 40-59% bilateral ICA stenosis. Repeat study due 06/2015  SignedEileen Stanford, PA-C 03/15/2015, 7:08 AM  Pager [phone] As above, patient seen and examined. Briefly he is an 82 year old male with past medical history coronary artery disease status post coronary artery bypass graft, ischemic cardiomyopathy, prior CRT, chronic renal insufficiency, hypertension, hyperlipidemia for evaluation of unstable angina. Patient has had chronic stable angina. Over the past 2 weeks however his angina has increased in severity, frequency and with less activity. This morning he let his dogs out and subsequently developed pain in his substernal area with radiation to his left upper extremity. There was nausea and dyspnea. His symptoms did not resolved with one nitroglycerin. He took a second and the total duration of his pain was 2 hours. He is presently pain-free. Electrocardiogram shows sinus rhythm with ventricular pacing. Initial enzymes negative. BUN and creatinine 41/2.40.   1 UA Symptoms are very concerning for unstable angina. We discussed options at length. Given the progressive nature of his symptoms I feel definitive evaluation is warranted. I discussed the risks of cardiac catheterization including death, myocardial infarction and stroke. I also discussed the possibility of worsening renal insufficiency including need for dialysis. He agrees to proceed. We will hold his Lasix and ARB starting Saturday morning. Hold Coumadin in anticipation of catheterization which will likely be performed Monday morning. 2 paroxysmal atrial fibrillation-Patient in sinus rhythm. Continue carvedilol. Hold Coumadin for cardiac catheterization. When INR less than 2. Begin IV heparin. 3 coronary artery disease-continue statin. Add aspirin given  unstable angina. 4 chronic stage III kidney disease-hold Lasix and ARB in anticipation of cardiac catheterization. Follow renal function closely. 5 chronic systolic congestive heart failure-we will need to follow him closely on examination for volume excess given we are holding his diuretics.  6 question infiltrate on chest x-ray-would plan PA and lateral chest x-ray prior to discharge.  Kirk Ruths

## 2015-03-15 NOTE — ED Notes (Signed)
Patient states he got up to take the dogs out and his chest and arms started hurting.  States has been SOB with exertion for about 1-2 months off and on.  Took 2 NTG at home and pain went from 7-8 to 1-2.  Upon EMS arrival stated pain was a zero.  ASA 324mg  and Zofran 4mg  by EMS  States sometimes he feels like he is drunk

## 2015-03-15 NOTE — ED Notes (Signed)
2 gram sodium restriction breakfast tray ordered

## 2015-03-15 NOTE — ED Provider Notes (Signed)
CSN: [id number]     Arrival date & time 03/15/15  0423 History   First MD Initiated Contact with Patient 03/15/15 [phone]     Chief Complaint  Patient presents with  . Chest Pain     (Consider location/radiation/quality/duration/timing/severity/associated sxs/prior Treatment) Patient is a 82 y.o. male presenting with chest pain. The history is provided by the patient.  Chest Pain He has a long cardiac history and has a pacemaker in place. Tonight, he walked to his back door to let the dogs out and developed a dull pain in both arms and his chest. Pain was severe and he rated it at 10/10. There is associated dyspnea, nausea, diaphoresis. He took 2 nitroglycerin with complete relief of pain. Pain started at about 2 AM, and resolved shortly before coming to the ED. He is completely pain-free here. He was given aspirin and Zofran by EMS. He does have chronic, stable angina pectoris. He consistently gets pain if he walks up a gentle incline. Tonight's episode involved far less exertion than one normally is required to trigger his angina. He denies any nocturnal symptoms.  Past Medical History  Diagnosis Date  . HYPERLIPIDEMIA 04/07/2008  . HYPERTENSION 04/07/2008  . CAD, ARTERY BYPASS GRAFT 05/22/2008  . CARDIOMYOPATHY, ISCHEMIC 05/22/2008  . MITRAL VALVE PROLAPSE, NON-RHEUMATIC 05/22/2008  . AV BLOCK 05/22/2008  . Chronic systolic heart failure ([place]) 10/13/2008  . CAROTID ARTERY STENOSIS 11/15/2009  . GERD 04/07/2008  . RENAL INSUFFICIENCY 10/17/2008  . DEGENERATIVE JOINT DISEASE, LEFT KNEE 04/07/2008  . COLONIC POLYPS, HX OF 04/07/2008  . DIVERTICULITIS, HX OF 04/07/2008  . Atrial fibrillation ([place]) 04/04/2010  . Decreased left ventricular function   . Ventricular tachycardia ([place])     Status post catheter ablation may 2013  . CHF (congestive heart failure) ([facility])     class 2 to 3   Past Surgical History  Procedure Laterality Date  . Cardiac surgery      open heart 1991, 1997, pacemaker insertion [place]  2010 CD 3231  . Lung surgery  1987  . Hernia repair  1952    2004  . Cholecystectomy    . Appendectomy    . Thoracotomy      secondary to lung mass  . Insert / replace / remove pacemaker      St Jude unify CD 3231/2010  . Colonoscopy N/A 08/26/2013    Procedure: COLONOSCOPY;  Surgeon: Beryle Beams, MD;  Location: [facility] ENDOSCOPY;  Service: Endoscopy;  Laterality: N/A;  . Left heart catheterization with coronary/graft angiogram N/A 04/30/2011    Procedure: LEFT HEART CATHETERIZATION WITH Beatrix Fetters;  Surgeon: Hillary Bow, MD;  Location: [facility] CATH LAB;  Service: Cardiovascular;  Laterality: N/A;  . V-tach ablation N/A 06/26/2011    Procedure: V-TACH ABLATION;  Surgeon: Thompson Grayer, MD;  Location: [facility] CATH LAB;  Service: Cardiovascular;  Laterality: N/A;  . Implantable cardioverter defibrillator generator change N/A 07/15/2013    Procedure: IMPLANTABLE CARDIOVERTER DEFIBRILLATOR GENERATOR CHANGE;  Surgeon: Deboraha Sprang, MD;  Location: [facility] CATH LAB;  Service: Cardiovascular;  Laterality: N/A;   Family History  Problem Relation Age of Onset  . Heart disease Mother     CAD, deceased   . Coronary artery disease Mother   . Heart disease Sister   . Heart disease Brother    Social History  Substance Use Topics  . Smoking status: Former Smoker -- 0.50 packs/day for 30 years    Types: Cigarettes    Quit date: 04/24/1972  .  Smokeless tobacco: Never Used  . Alcohol Use: No    Review of Systems  Cardiovascular: Positive for chest pain.  All other systems reviewed and are negative.     Allergies  Carisoprodol; Lisinopril; Tape; Zolpidem tartrate; and Penicillins  Home Medications   Prior to Admission medications   Medication Sig Start Date End Date Taking? Authorizing Provider  atorvastatin (LIPITOR) 20 MG tablet TAKE 1 TABLET BY MOUTH EVERY DAY 04/25/14   Larey Dresser, MD  benzonatate (TESSALON) 100 MG capsule Take 1 capsule (100 mg total) by mouth 2 (two) times  daily as needed for cough. 02/27/15   Lucretia Kern, DO  carvedilol (COREG) 25 MG tablet TAKE 1 TABLET BY MOUTH TWICE DAILY 06/12/14   Larey Dresser, MD  diphenhydramine-acetaminophen (TYLENOL PM) 25-500 MG TABS Take 1 tablet by mouth at bedtime as needed (sleep).     Historical Provider, MD  docusate sodium (COLACE) 100 MG capsule Take 100 mg by mouth daily as needed. For constipation    Historical Provider, MD  donepezil (ARICEPT) 10 MG tablet Take 1 tablet (10 mg total) by mouth at bedtime. 10/23/14   Eulas Post, MD  donepezil (ARICEPT) 5 MG tablet TAKE 1 TABLET(5 MG) BY MOUTH AT BEDTIME 12/25/14   Eulas Post, MD  fenofibrate 160 MG tablet Take 1 tablet (160 mg total) by mouth daily. 12/18/14   Eulas Post, MD  fenofibrate 160 MG tablet TAKE 1 TABLET BY MOUTH EVERY DAY 02/15/15   Eulas Post, MD  furosemide (LASIX) 40 MG tablet TAKE 2 TABLETS BY MOUTH EVERY MORNING AND 1 TABLET BY MOUTH EVERY AFTERNOON 02/22/15   Larey Dresser, MD  isosorbide mononitrate (IMDUR) 30 MG 24 hr tablet Take 1 tablet (30 mg total) by mouth daily. With 60mg  tablet to equal 90mg  daily 02/09/15   Larey Dresser, MD  isosorbide mononitrate (IMDUR) 60 MG 24 hr tablet Take 1 tablet (60 mg total) by mouth daily. 08/18/14   Larey Dresser, MD  losartan (COZAAR) 50 MG tablet Take 1 tablet (50 mg total) by mouth daily. Pt. Needs f/u appt. 03/05/15   Larey Dresser, MD  nitroGLYCERIN (NITROSTAT) 0.4 MG SL tablet PLACE 1 TABLET UNDER THE TONGUE EVERY 5 MINUTES AS NEEDED FOR CHEST PAIN 11/15/14   Larey Dresser, MD  omeprazole (PRILOSEC) 20 MG capsule TAKE ONE CAPSULE BY MOUTH TWICE DAILY 10/16/14   Eulas Post, MD  potassium chloride SA (K-DUR,KLOR-CON) 20 MEQ tablet TAKE 1 TABLET BY MOUTH TWICE DAILY 03/08/15   Deboraha Sprang, MD  Tamsulosin HCl (FLOMAX) 0.4 MG CAPS Take 0.4 mg by mouth daily.     Historical Provider, MD  triamcinolone cream (KENALOG) 0.1 % Apply 1 application topically as needed (for  rash).  08/09/13   Eulas Post, MD  vitamin B-12 (CYANOCOBALAMIN) 500 MCG tablet Take 500 mcg by mouth daily.      Historical Provider, MD  warfarin (COUMADIN) 5 MG tablet TAKE AS DIRECTED BY COUMADIN CLINIC 01/15/15   Larey Dresser, MD   BP 121/59 mmHg  Pulse 64  Temp(Src) 97.7 F (36.5 C) (Oral)  Resp 22  Ht 6\' 1"  (1.854 m)  Wt 185 lb (83.915 kg)  BMI 24.41 kg/m2  SpO2 98% Physical Exam  Nursing note and vitals reviewed.  82 year old male, resting comfortably and in no acute distress. Vital signs are significant for tachypnea. Oxygen saturation is 98%, which is normal. Head is normocephalic and  atraumatic. PERRLA, EOMI. Oropharynx is clear. Neck is nontender and supple without adenopathy or JVD. Back is nontender and there is no CVA tenderness. Lungs are clear without rales, wheezes, or rhonchi. Chest is nontender. Pacemaker is present in left subclavian area. Heart has regular rate and rhythm without murmur. Abdomen is soft, flat, nontender without masses or hepatosplenomegaly and peristalsis is normoactive. Extremities have 1+ edema, full range of motion is present. Skin is warm and dry without rash. Neurologic: Mental status is normal, cranial nerves are intact, there are no motor or sensory deficits.]  ED Course  Procedures (including critical care time) Labs Review Results for orders placed or performed during the hospital encounter of 03/15/15  CBC  Result Value Ref Range   WBC 5.9 4.0 - 10.5 K/uL   RBC 4.48 4.22 - 5.81 MIL/uL   Hemoglobin 12.7 (L) 13.0 - 17.0 g/dL   HCT 38.7 (L) 39.0 - 52.0 %   MCV 86.4 78.0 - 100.0 fL   MCH 28.3 26.0 - 34.0 pg   MCHC 32.8 30.0 - 36.0 g/dL   RDW 14.5 11.5 - 15.5 %   Platelets 162 150 - 400 K/uL  Comprehensive metabolic panel  Result Value Ref Range   Sodium 142 135 - 145 mmol/L   Potassium 4.0 3.5 - 5.1 mmol/L   Chloride 103 101 - 111 mmol/L   CO2 26 22 - 32 mmol/L   Glucose, Bld 110 (H) 65 - 99 mg/dL   BUN 41 (H) 6 -  20 mg/dL   Creatinine, Ser 2.40 (H) 0.61 - 1.24 mg/dL   Calcium 9.6 8.9 - 10.3 mg/dL   Total Protein 6.9 6.5 - 8.1 g/dL   Albumin 3.5 3.5 - 5.0 g/dL   AST 30 15 - 41 U/L   ALT 18 17 - 63 U/L   Alkaline Phosphatase 30 (L) 38 - 126 U/L   Total Bilirubin 0.9 0.3 - 1.2 mg/dL   GFR calc non Af Amer 24 (L) >60 mL/min   GFR calc Af Amer 28 (L) >60 mL/min   Anion gap 13 5 - 15  Brain natriuretic peptide (order if patient c/o SOB ONLY)  Result Value Ref Range   B Natriuretic Peptide 441.9 (H) 0.0 - 100.0 pg/mL  Protime-INR  Result Value Ref Range   Prothrombin Time 28.2 (H) 11.6 - 15.2 seconds   INR 2.69 (H) 0.00 - 1.49  Differential  Result Value Ref Range   Neutrophils Relative % 52 %   Neutro Abs 3.1 1.7 - 7.7 K/uL   Lymphocytes Relative 28 %   Lymphs Abs 1.7 0.7 - 4.0 K/uL   Monocytes Relative 16 %   Monocytes Absolute 1.0 0.1 - 1.0 K/uL   Eosinophils Relative 3 %   Eosinophils Absolute 0.2 0.0 - 0.7 K/uL   Basophils Relative 1 %   Basophils Absolute 0.0 0.0 - 0.1 K/uL  I-stat troponin, ED (not at [facility])  Result Value Ref Range   Troponin i, poc 0.03 0.00 - 0.08 ng/mL   Comment 3           Imaging Review Dg Chest Portable 1 View  03/15/2015  CLINICAL DATA:  Acute onset of bilateral arm pain. Mid chest pain. Chronic shortness of breath. Initial encounter. EXAM: PORTABLE CHEST 1 VIEW COMPARISON:  Chest radiograph performed 02/06/2009 FINDINGS: The lungs are well-aerated. Mild right midlung opacity may reflect atelectasis or possibly mild pneumonia. There is no evidence of pleural effusion or pneumothorax. The cardiomediastinal silhouette is borderline  normal in size. The patient is status post median sternotomy. A pacemaker/AICD is noted overlying the right chest wall, with leads ending overlying the right atrium and right ventricle. No acute osseous abnormalities are seen. IMPRESSION: Mild right midlung opacity may reflect atelectasis or possibly mild pneumonia. Followup PA and  lateral chest X-ray is recommended in 3-4 weeks following trial of antibiotic therapy to ensure resolution and exclude underlying malignancy. If the patient does not have symptoms of pneumonia, a PA view of the chest could be considered for further evaluation Electronically Signed   By: Garald Balding M.D.   On: 03/15/2015 05:32   I have personally reviewed and evaluated these images and lab results as part of my medical decision-making.   EKG Interpretation   Date/Time:  Thursday March 15 2015 04:42:42 EST Ventricular Rate:  63 PR Interval:  49 QRS Duration: 158 QT Interval:  500 QTC Calculation: 512 R Axis:   -19 Text Interpretation:  Ventricular-paced rhythm No further analysis  attempted due to paced rhythm Baseline wander in lead(s) V3 When compared  with ECG of 07/15/2013, No significant change was found Confirmed by [facility]   MD, Brinklee Cisse ([id number]) on 03/15/2015 4:57:55 AM      MDM   Final diagnoses:  Angina pectoris ([place])  Anticoagulated on warfarin  Biventricular implantable cardioverter-defibrillator in situ    Angina pectoris which came on with far less exertion than what he normally requires. ECG shows AV dual chamber pacing and is not helpful regarding ST and T changes. Therefore, he will need to be kept in the ED for delta troponin. Cardiology consultation will be obtained given the change in the amount of exertion needed to bring on this angina attack.  Repeat troponin has been ordered. I have discussed case with Dr. Stanford Breed of cardiology service who agrees to come to evaluate the patient in the ED. Case is signed out to Dr. Ralene Bathe.  Delora Fuel, MD [id number]

## 2015-03-15 NOTE — Consult Note (Deleted)
Patient ID: [name] MRN: [id number], DOB/AGE: 1931/11/03   Admit date: 03/15/2015  Consulting Physician: Gershon Mussel Blount Primary Physician: Eulas Post, MD Primary Cardiologist: Dr. Aundra Dubin Dr. Caryl Comes (EP)  Reason for admission: chest pain  Pt. Profile:  Sean [name] is a 82 y.o. male with a history of CAD s/p CABG (1991 with redo in 1997) ischemic cardiomyopathy (EF 25-30%) s/p ICD--> CRT, CKD, mod MR, HTN, HLD, VT s/p ablation (2013), carotid artery stenosis and PAF on coumadin who presented to [facility] today with chest pain.  He had his last cardiac catheterization in 2013 after recurrent episodes of ventricular tachycardia. This revealed  [place] 2013 with SVG-OM patent and LIMA-LAD patent, other 3 SVGs closed.    Final Conclusions:  1. Continued patency of the LIMA to the LAD diagonal 2. Continued patency of the stent OM2 graft 3. Known occlusion of the three remaining SVG 4. Known occlusion of the RCA 5. Normal LVEDP  6. Mild moderate progression of the LMCA lesion----supplying a septal which itself has disease.  He was continued on medical therapy. Per Dr. Lia Foyer, PCI of LMCA could be considered, but did not have symptoms and was felt to be unlikely the source for recurrent shocks.  He had a nuclear stress test in 11/2013 which showed no significant ischemia. EF not evaluated due to study not being gated.    He has been noted have chronic stable angina with exertion in the past for which he has to take SL NTG 1-2 times a month. He was in his usual state of health until yesterday when he just didn't feel that good and laid down to rest. This morning, he got up to take the dogs out and got chest pain. This was unusual for him as he was walking on flat ground and not really exerting himself. Chest pain was described as someone standing on his chest. At its worst the pain was a 7 out of 10. It was associated with shortness of breath and nausea. No diaphoresis. It  was relieved after 2 SL nitroglycerin. He became worried and called EMS.   He denies lower extremity edema, orthopnea or PND. He often feels dizzy or "swimmy headed" but no syncope. He has had some lower extremity cramping that hurt when he tries to straighten his legs. He has also had a recent cough for which he got cough medicine. It is not a productive cough.   In the ER his ECG IS V paced with no acute ischemic changes. Troponin negative. BNP mildly elevated. CXR with possible PNA.  PMH; 1. History of DVT 2. HTN: ACEI cough. 3. Hyperlipidemia 4. CAD s/p CABG in 1991 with redo in 1997.  [place] 2013 with SVG-OM patent and LIMA-LAD patent, other 3 SVGs closed.  Chronic stable angina.   5. Ischemic cardiomyopathy: Echo in 4/13 with dilated LV, EF 30%, mildly dilated RV with mildly decreased RV function, moderate MR, mild AI.   - St Jude ICD with CRT upgrade in 9/10.   6. Carotid stenosis: 5/14 carotid dopplers with 40-59% bilateral ICA stenosis and occluded right vertebral.  5/15 carotid dopplers with 40-59% bilateral ICA stenosis.   7. GERD 8. CKD 9. OA 10. Paroxysmal atrial fibrillation on coumadin 11. Mitral regurgitation: Moderate on last echo. 12. VT: s/p ablation in 5/13.   13. H/o diverticulitis 14. H/o cholecystectomy 15. H/o appendectomy 16. Renal artery stenosis: Renal artery dopplers (5/14) with 60-99% proximal right renal artery stenosis as well as  severe celiac artery stenosis.   17. ABIs (12/14) were normal. 18. Low back pain with sciatica   Problem List  Past Medical History  Diagnosis Date  . HYPERLIPIDEMIA 04/07/2008  . HYPERTENSION 04/07/2008  . CAD, ARTERY BYPASS GRAFT 05/22/2008  . CARDIOMYOPATHY, ISCHEMIC 05/22/2008  . MITRAL VALVE PROLAPSE, NON-RHEUMATIC 05/22/2008  . AV BLOCK 05/22/2008  . Chronic systolic heart failure ([place]) 10/13/2008  . CAROTID ARTERY STENOSIS 11/15/2009  . GERD 04/07/2008  . RENAL INSUFFICIENCY 10/17/2008  . DEGENERATIVE JOINT DISEASE, LEFT KNEE  04/07/2008  . COLONIC POLYPS, HX OF 04/07/2008  . DIVERTICULITIS, HX OF 04/07/2008  . Atrial fibrillation ([place]) 04/04/2010  . Decreased left ventricular function   . Ventricular tachycardia ([place])     Status post catheter ablation may 2013  . CHF (congestive heart failure) (HCC)     class 2 to 3    Past Surgical History  Procedure Laterality Date  . Cardiac surgery      open heart 1991, 1997, pacemaker insertion [place] 2010 CD 3231  . Lung surgery  1987  . Hernia repair  1952    2004  . Cholecystectomy    . Appendectomy    . Thoracotomy      secondary to lung mass  . Insert / replace / remove pacemaker      St Jude unify CD 3231/2010  . Colonoscopy N/A 08/26/2013    Procedure: COLONOSCOPY;  Surgeon: Beryle Beams, MD;  Location: [facility] ENDOSCOPY;  Service: Endoscopy;  Laterality: N/A;  . Left heart catheterization with coronary/graft angiogram N/A 04/30/2011    Procedure: LEFT HEART CATHETERIZATION WITH Beatrix Fetters;  Surgeon: Hillary Bow, MD;  Location: [facility] CATH LAB;  Service: Cardiovascular;  Laterality: N/A;  . V-tach ablation N/A 06/26/2011    Procedure: V-TACH ABLATION;  Surgeon:  Grayer, MD;  Location: [facility] CATH LAB;  Service: Cardiovascular;  Laterality: N/A;  . Implantable cardioverter defibrillator generator change N/A 07/15/2013    Procedure: IMPLANTABLE CARDIOVERTER DEFIBRILLATOR GENERATOR CHANGE;  Surgeon: Deboraha Sprang, MD;  Location: [facility] CATH LAB;  Service: Cardiovascular;  Laterality: N/A;     Allergies  Allergies  Allergen Reactions  . Carisoprodol     REACTION: rash  . Lisinopril     REACTION: cough  . Tape     Cloth Tape tears skin  . Zolpidem Tartrate Other (See Comments)    Altered mental status  . Penicillins Rash     Home Medications  Prior to Admission medications   Medication Sig Start Date End Date Taking? Authorizing Provider  atorvastatin (LIPITOR) 20 MG tablet TAKE 1 TABLET BY MOUTH EVERY DAY 04/25/14  Yes Larey Dresser, MD    carvedilol (COREG) 25 MG tablet TAKE 1 TABLET BY MOUTH TWICE DAILY 06/12/14  Yes Larey Dresser, MD  donepezil (ARICEPT) 10 MG tablet Take 1 tablet (10 mg total) by mouth at bedtime. 10/23/14  Yes Eulas Post, MD  fenofibrate 160 MG tablet Take 1 tablet (160 mg total) by mouth daily. 12/18/14  Yes Eulas Post, MD  furosemide (LASIX) 40 MG tablet TAKE 2 TABLETS BY MOUTH EVERY MORNING AND 1 TABLET BY MOUTH EVERY AFTERNOON 02/22/15  Yes Larey Dresser, MD  isosorbide mononitrate (IMDUR) 30 MG 24 hr tablet Take 1 tablet (30 mg total) by mouth daily. With 60mg  tablet to equal 90mg  daily 02/09/15  Yes Larey Dresser, MD  isosorbide mononitrate (IMDUR) 60 MG 24 hr tablet Take 1 tablet (60 mg total) by mouth  daily. Patient taking differently: Take 60 mg by mouth daily. Take with isosorbide 30 mg 08/18/14  Yes Larey Dresser, MD  losartan (COZAAR) 50 MG tablet Take 1 tablet (50 mg total) by mouth daily. Pt. Needs f/u appt. 03/05/15  Yes Larey Dresser, MD  nitroGLYCERIN (NITROSTAT) 0.4 MG SL tablet PLACE 1 TABLET UNDER THE TONGUE EVERY 5 MINUTES AS NEEDED FOR CHEST PAIN 11/15/14  Yes Larey Dresser, MD  omeprazole (PRILOSEC) 20 MG capsule TAKE ONE CAPSULE BY MOUTH TWICE DAILY 10/16/14  Yes Eulas Post, MD  potassium chloride SA (K-DUR,KLOR-CON) 20 MEQ tablet TAKE 1 TABLET BY MOUTH TWICE DAILY 03/08/15  Yes Deboraha Sprang, MD  warfarin (COUMADIN) 5 MG tablet Take 2.5-5 mg by mouth daily. Take 5 mg on Monday and Friday then take 2.5 mg all the other days   Yes Historical Provider, MD  benzonatate (TESSALON) 100 MG capsule Take 1 capsule (100 mg total) by mouth 2 (two) times daily as needed for cough. Patient not taking: Reported on 03/15/2015 02/27/15   Lucretia Kern, DO  donepezil (ARICEPT) 5 MG tablet TAKE 1 TABLET(5 MG) BY MOUTH AT BEDTIME Patient not taking: Reported on 03/15/2015 12/25/14   Eulas Post, MD  fenofibrate 160 MG tablet TAKE 1 TABLET BY MOUTH EVERY DAY Patient not taking:  Reported on 03/15/2015 02/15/15   Eulas Post, MD  warfarin (COUMADIN) 5 MG tablet TAKE AS DIRECTED BY COUMADIN CLINIC Patient not taking: Reported on 03/15/2015 01/15/15   Larey Dresser, MD    Family History  Family History  Problem Relation Age of Onset  . Heart disease Mother     CAD, deceased   . Coronary artery disease Mother   . Heart disease Sister   . Heart disease Brother    Family Status  Relation Status Death Age  . Mother Deceased 45    CAD  . Father Deceased     deceased age 51, sepsis  . Paternal Grandfather Deceased   . Paternal Grandmother Deceased   . Maternal Grandfather Deceased   . Maternal Grandmother Deceased      Social History  Social History   Social History  . Marital Status: Married    Spouse Name: N/A  . Number of Children: 2  . Years of Education: N/A   Occupational History  .     Social History Main Topics  . Smoking status: Former Smoker -- 0.50 packs/day for 30 years    Types: Cigarettes    Quit date: 04/24/1972  . Smokeless tobacco: Never Used  . Alcohol Use: No  . Drug Use: No  . Sexual Activity: Not on file   Other Topics Concern  . Not on file   Social History Narrative      All other systems reviewed and are otherwise negative except as noted above.  Physical Exam  Blood pressure 108/61, pulse 60, temperature 97.7 F (36.5 C), temperature source Oral, resp. rate 22, height 6\' 1"  (1.854 m), weight 185 lb (83.915 kg), SpO2 96 %.  General: Pleasant, NAD Psych: Normal affect. Neuro: Alert and oriented X 3. Moves all extremities spontaneously. HEENT: Normal  Neck: Supple without bruits or JVD. Lungs:  Resp regular and unlabored, CTA. Heart: RRR no s3, s4, or + murmur. Abdomen: Soft, non-tender, non-distended, BS + x 4.  Extremities: No clubbing, cyanosis or edema. DP/PT/Radials 2+ and equal bilaterally.  Labs  No results for input(s): CKTOTAL, CKMB, TROPONINI in the last 72 hours.  Lab Results  Component  Value Date   WBC 5.9 03/15/2015   HGB 12.7* 03/15/2015   HCT 38.7* 03/15/2015   MCV 86.4 03/15/2015   PLT 162 03/15/2015    Recent Labs Lab 03/15/15 0506  NA 142  K 4.0  CL 103  CO2 26  BUN 41*  CREATININE 2.40*  CALCIUM 9.6  PROT 6.9  BILITOT 0.9  ALKPHOS 30*  ALT 18  AST 30  GLUCOSE 110*   Lab Results  Component Value Date   CHOL 163 03/02/2014   HDL 31.80* 03/02/2014   LDLCALC 74 04/22/2013   TRIG 251.0* 03/02/2014   No results found for: DDIMER   Radiology/Studies  Dg Chest Portable 1 View  03/15/2015  CLINICAL DATA:  Acute onset of bilateral arm pain. Mid chest pain. Chronic shortness of breath. Initial encounter. EXAM: PORTABLE CHEST 1 VIEW COMPARISON:  Chest radiograph performed 02/06/2009 FINDINGS: The lungs are well-aerated. Mild right midlung opacity may reflect atelectasis or possibly mild pneumonia. There is no evidence of pleural effusion or pneumothorax. The cardiomediastinal silhouette is borderline normal in size. The patient is status post median sternotomy. A pacemaker/AICD is noted overlying the right chest wall, with leads ending overlying the right atrium and right ventricle. No acute osseous abnormalities are seen. IMPRESSION: Mild right midlung opacity may reflect atelectasis or possibly mild pneumonia. Followup PA and lateral chest X-ray is recommended in 3-4 weeks following trial of antibiotic therapy to ensure resolution and exclude underlying malignancy. If the patient does not have symptoms of pneumonia, a PA view of the chest could be considered for further evaluation Electronically Signed   By: Garald Balding M.D.   On: 03/15/2015 05:32     Cardiac Catheterization Procedure Note  Name: Sean [name] MRN: [id number] DOB: Jan 12, 1932  Procedure: Left Heart Cath, Selective Coronary Angiography, SVG angiography, SLIMA angiography  Final Conclusions:  1. Continued patency of the LIMA to the LAD diagonal 2. Continued patency of the stent OM2  graft 3. Known occlusion of the three remaining SVG 4. Known occlusion of the RCA 5. Normal LVEDP on current regimen.  6. Mild moderate progression of the LMCA lesion----supplying a septal which itself has disease.  Recommendations:  1. Continue medical therapy. 2. Replace K 3. Discuss with Dr. Caryl Comes when he returns. Not much change in anatomy. PCI of LMCA could be considered, but he has had no symptoms and seems unlikely source for recurrent shocks.    ECG  AV paced  ASSESSMENT AND PLAN MACKLEN WHIDBEE is a 82 y.o. male with a history of CAD s/p CABG (1991 with redo in 1997) ischemic cardiomyopathy (EF 25-30%) s/p ICD--> CRT, CKD, mod MR, HTN, HLD, VT s/p ablation (2013), carotid artery stenosis and PAF on coumadin who presented to [facility] today with chest pain.  Chest pain/CAD:  ECG IS V paced with no acute ischemic changes. Troponin negative. BNP mildly elevated. Would not be a good Candidate at this point with therapeutic INR at 2.6 and CKD. Creat 2.4 today.  Continue ASA, BB and statin. Symptoms concerning for [place]. Will plan for LHC next Monday when INR falls to at least below 1.8. Will start heparin when INR <2. Will consult pharmacy. We'll continue daily Lasix for now but this will need to be held closer to cath to minimize kidney injury. Will hold ACE for now.   Chronic systolic CHF: EF [id number] on last 2-D echo in 08/2014. BNP is mildly elevated ~441. He does not appear volume overloaded  to me. -- Continue home lasix 80mg  AM and 40mg  PM for now as above. Continue Coreg 25mg  BID but will hold Losartan 50 mg daily.   CXR with possible PNA: he has had a recent cough, but non productive. His white count is normal and he is afebrile. Will hold off on treating this for now. Continue to monitor  PAF on coumadin: INR is 2.69. Will hold this for now, in case he needs a possible heart cath. I have asked pharmacy to start heparin when INR falls below 2. ECG is V paced.  CKD: creat 2.4  which appears slightly above baseline. Continue to monitor. Holding ARB as above  HTN: Blood pressure well controlled currently. Monitor closely off losartan 50 mg daily.   Carotid stenosis: 06/2014: Stable 40-59% bilateral ICA stenosis. Repeat study due 06/2015  SignedEileen Stanford, PA-C 03/15/2015, 7:08 AM  Pager [phone]

## 2015-03-16 LAB — COMPREHENSIVE METABOLIC PANEL
ALT: 18 U/L (ref 17–63)
AST: 27 U/L (ref 15–41)
Albumin: 3.4 g/dL — ABNORMAL LOW (ref 3.5–5.0)
Alkaline Phosphatase: 29 U/L — ABNORMAL LOW (ref 38–126)
Anion gap: 10 (ref 5–15)
BILIRUBIN TOTAL: 0.9 mg/dL (ref 0.3–1.2)
BUN: 36 mg/dL — AB (ref 6–20)
CALCIUM: 9.4 mg/dL (ref 8.9–10.3)
CO2: 30 mmol/L (ref 22–32)
CREATININE: 2.15 mg/dL — AB (ref 0.61–1.24)
Chloride: 103 mmol/L (ref 101–111)
GFR, EST AFRICAN AMERICAN: 31 mL/min — AB (ref >60)
GFR, EST NON AFRICAN AMERICAN: 27 mL/min — AB (ref >60)
Glucose, Bld: 105 mg/dL — ABNORMAL HIGH (ref 65–99)
Potassium: 4 mmol/L (ref 3.5–5.1)
Sodium: 143 mmol/L (ref 135–145)
TOTAL PROTEIN: 7.1 g/dL (ref 6.5–8.1)

## 2015-03-16 LAB — HEMOGLOBIN A1C
Hgb A1c MFr Bld: 6.1 % — ABNORMAL HIGH (ref 4.8–5.6)
Mean Plasma Glucose: 128 mg/dL

## 2015-03-16 LAB — CBC
HCT: 39.3 % (ref 39.0–52.0)
Hemoglobin: 12.4 g/dL — ABNORMAL LOW (ref 13.0–17.0)
MCH: 27.4 pg (ref 26.0–34.0)
MCHC: 31.6 g/dL (ref 30.0–36.0)
MCV: 86.8 fL (ref 78.0–100.0)
PLATELETS: 164 10*3/uL (ref 150–400)
RBC: 4.53 MIL/uL (ref 4.22–5.81)
RDW: 14.5 % (ref 11.5–15.5)
WBC: 5.5 10*3/uL (ref 4.0–10.5)

## 2015-03-16 LAB — PROTIME-INR
INR: 2.49 — ABNORMAL HIGH (ref 0.00–1.49)
Prothrombin Time: 26.6 seconds — ABNORMAL HIGH (ref 11.6–15.2)

## 2015-03-16 LAB — LIPID PANEL
CHOL/HDL RATIO: 4.3 ratio
CHOLESTEROL: 139 mg/dL (ref 0–200)
HDL: 32 mg/dL — ABNORMAL LOW (ref >40)
LDL CALC: 86 mg/dL (ref 0–99)
Triglycerides: 105 mg/dL (ref <150)
VLDL: 21 mg/dL (ref 0–40)

## 2015-03-16 NOTE — Progress Notes (Signed)
ANTICOAGULATION CONSULT NOTE - Initial Consult  Pharmacy Consult for Heparin when INR < 2 Indication: chest pain/ACS  Allergies  Allergen Reactions  . Carisoprodol     REACTION: rash  . Lisinopril     REACTION: cough  . Tape     Cloth Tape tears skin  . Zolpidem Tartrate Other (See Comments)    Altered mental status  . Penicillins Rash    Patient Measurements: Height: 6\' 1"  (185.4 cm) Weight: 185 lb 1.6 oz (83.961 kg) IBW/kg (Calculated) : 79.9 Heparin Dosing Weight: 84 kg   Vital Signs: Temp: 98.1 F (36.7 C) (02/10 0328) Temp Source: Oral (02/10 0328) BP: 118/53 mmHg (02/10 0328) Pulse Rate: 60 (02/10 0328)  Labs:  Recent Labs  03/15/15 0506 03/15/15 1116 03/15/15 1543 03/15/15 2217 03/16/15 0500  HGB 12.7*  --   --   --  12.4*  HCT 38.7*  --   --   --  39.3  PLT 162  --   --   --  164  LABPROT 28.2* 26.8*  --   --  26.6*  INR 2.69* 2.52*  --   --  2.49*  CREATININE 2.40*  --   --   --  2.15*  TROPONINI  --  0.06* 0.06* 0.06*  --     Estimated Creatinine Clearance: 30.5 mL/min (by C-G formula based on Cr of 2.15).   Medical History: Past Medical History  Diagnosis Date  . HYPERLIPIDEMIA 04/07/2008  . HYPERTENSION 04/07/2008  . CAD, ARTERY BYPASS GRAFT 05/22/2008  . CARDIOMYOPATHY, ISCHEMIC 05/22/2008  . MITRAL VALVE PROLAPSE, NON-RHEUMATIC 05/22/2008  . AV BLOCK 05/22/2008  . Chronic systolic heart failure ([place]) 10/13/2008  . CAROTID ARTERY STENOSIS 11/15/2009  . GERD 04/07/2008  . RENAL INSUFFICIENCY 10/17/2008  . DEGENERATIVE JOINT DISEASE, LEFT KNEE 04/07/2008  . COLONIC POLYPS, HX OF 04/07/2008  . DIVERTICULITIS, HX OF 04/07/2008  . Atrial fibrillation ([place]) 04/04/2010  . Decreased left ventricular function   . Ventricular tachycardia ([place])     Status post catheter ablation may 2013  . CHF (congestive heart failure) (HCC)     class 2 to 3  . Shortness of breath dyspnea     Medications:  Prescriptions prior to admission  Medication Sig Dispense Refill  Last Dose  . atorvastatin (LIPITOR) 20 MG tablet TAKE 1 TABLET BY MOUTH EVERY DAY 30 tablet 11 03/14/2015 at Unknown time  . carvedilol (COREG) 25 MG tablet TAKE 1 TABLET BY MOUTH TWICE DAILY 180 tablet 3 03/14/2015 at 1800  . donepezil (ARICEPT) 10 MG tablet Take 1 tablet (10 mg total) by mouth at bedtime. 90 tablet 3 03/14/2015 at Unknown time  . fenofibrate 160 MG tablet Take 1 tablet (160 mg total) by mouth daily. 60 tablet 5 03/14/2015 at Unknown time  . furosemide (LASIX) 40 MG tablet TAKE 2 TABLETS BY MOUTH EVERY MORNING AND 1 TABLET BY MOUTH EVERY AFTERNOON 270 tablet 0 03/14/2015 at Unknown time  . isosorbide mononitrate (IMDUR) 30 MG 24 hr tablet Take 1 tablet (30 mg total) by mouth daily. With 60mg  tablet to equal 90mg  daily 90 tablet 1 03/14/2015 at Unknown time  . isosorbide mononitrate (IMDUR) 60 MG 24 hr tablet Take 1 tablet (60 mg total) by mouth daily. (Patient taking differently: Take 60 mg by mouth daily. Take with isosorbide 30 mg) 90 tablet 1 03/14/2015 at Unknown time  . losartan (COZAAR) 50 MG tablet Take 1 tablet (50 mg total) by mouth daily. Pt. Needs f/u appt. [place]  tablet 0 03/14/2015 at Unknown time  . nitroGLYCERIN (NITROSTAT) 0.4 MG SL tablet PLACE 1 TABLET UNDER THE TONGUE EVERY 5 MINUTES AS NEEDED FOR CHEST PAIN 100 tablet 0 03/15/2015 at Unknown time  . omeprazole (PRILOSEC) 20 MG capsule TAKE ONE CAPSULE BY MOUTH TWICE DAILY 60 capsule 5 03/14/2015 at Unknown time  . potassium chloride SA (K-DUR,KLOR-CON) 20 MEQ tablet TAKE 1 TABLET BY MOUTH TWICE DAILY 60 tablet 3 03/14/2015 at Unknown time  . warfarin (COUMADIN) 5 MG tablet Take 2.5-5 mg by mouth daily. Take 5 mg on Monday and Friday then take 2.5 mg all the other days   03/14/2015 at Unknown time  . benzonatate (TESSALON) 100 MG capsule Take 1 capsule (100 mg total) by mouth 2 (two) times daily as needed for cough. (Patient not taking: Reported on 03/15/2015) 20 capsule 0 Completed Course at Unknown time  . donepezil (ARICEPT) 5 MG tablet TAKE  1 TABLET(5 MG) BY MOUTH AT BEDTIME (Patient not taking: Reported on 03/15/2015) 90 tablet 2 Not Taking at Unknown time  . fenofibrate 160 MG tablet TAKE 1 TABLET BY MOUTH EVERY DAY (Patient not taking: Reported on 03/15/2015) 60 tablet 5 Not Taking at Unknown time  . warfarin (COUMADIN) 5 MG tablet TAKE AS DIRECTED BY COUMADIN CLINIC (Patient not taking: Reported on 03/15/2015) 90 tablet 1 Not Taking at Unknown time    Assessment: 29 YOM who presented with chest pain concerning for [place]. Planning LHC next Monday when INR falls below 1.8.   Goal of Therapy:  Heparin level 0.3-0.7 units/ml Monitor platelets by anticoagulation protocol: Yes   Plan:  Hold Heparin for now. Start IV heparin when INR < 2.   Albertina Parr, PharmD., BCPS Clinical Pharmacist Phone [phone]

## 2015-03-16 NOTE — Progress Notes (Signed)
    Subjective:  Denies CP or dyspnea   Objective:  Filed Vitals:   03/15/15 1005 03/15/15 1325 03/15/15 1951 03/16/15 0328  BP: 110/66 102/39 104/44 118/53  Pulse: 67 59 59 60  Temp: 98 F (36.7 C) 97.7 F (36.5 C) 98.2 F (36.8 C) 98.1 F (36.7 C)  TempSrc: Oral Oral Oral Oral  Resp:   18 17  Height: 6\' 1"  (1.854 m)     Weight: 187 lb 14.4 oz (85.231 kg)   185 lb 1.6 oz (83.961 kg)  SpO2: 98% 96% 92% 97%    Intake/Output from previous day:  Intake/Output Summary (Last 24 hours) at 03/16/15 0834 Last data filed at 03/15/15 1820  Gross per 24 hour  Intake    480 ml  Output      0 ml  Net    480 ml    Physical Exam: Physical exam: Well-developed well-nourished in no acute distress.  Skin is warm and dry.  HEENT is normal.  Neck is supple.  Chest is clear to auscultation with normal expansion. 3/6 systolic murmur apex Cardiovascular exam is regular rate and rhythm.  Abdominal exam nontender or distended. No masses palpated. Extremities show no edema. neuro grossly intact    Lab Results: Basic Metabolic Panel:  Recent Labs  03/15/15 0506 03/16/15 0500  NA 142 143  K 4.0 4.0  CL 103 103  CO2 26 30  GLUCOSE 110* 105*  BUN 41* 36*  CREATININE 2.40* 2.15*  CALCIUM 9.6 9.4   CBC:  Recent Labs  03/15/15 0506 03/16/15 0500  WBC 5.9 5.5  NEUTROABS 3.1  --   HGB 12.7* 12.4*  HCT 38.7* 39.3  MCV 86.4 86.8  PLT 162 164   Cardiac Enzymes:  Recent Labs  03/15/15 1116 03/15/15 1543 03/15/15 2217  TROPONINI 0.06* 0.06* 0.06*     Assessment/Plan:  1 UA Symptoms are very concerning for unstable angina. We discussed options at length previously. Given the progressive nature of his symptoms I feel definitive evaluation is warranted. I discussed the risks of cardiac catheterization including death, myocardial infarction and stroke. I also discussed the possibility of worsening renal insufficiency including need for dialysis. He agrees to proceed. We  will hold his Lasix and ARB starting Saturday morning. No vgram; watch renal function following procedure. Hold Coumadin in anticipation of catheterization which will likely be performed Monday morning. Check echo for LV function.  2 paroxysmal atrial fibrillation-Patient remains in sinus rhythm. Continue carvedilol. Hold Coumadin for cardiac catheterization. When INR less than 2. Begin IV heparin. 3 coronary artery disease-continue aspirin and statin.  4 chronic stage III kidney disease-hold Lasix and ARB in anticipation of cardiac catheterization. Follow renal function closely. 5 chronic systolic congestive heart failure-we will need to follow him closely on examination for volume excess given we are holding his diuretics.  6 question infiltrate on chest x-ray-would plan PA and lateral chest x-ray prior to discharge.  Sean [name] 03/16/2015, 8:34 AM

## 2015-03-17 DIAGNOSIS — N183 Chronic kidney disease, stage 3 (moderate): Secondary | ICD-10-CM

## 2015-03-17 DIAGNOSIS — I480 Paroxysmal atrial fibrillation: Secondary | ICD-10-CM

## 2015-03-17 LAB — BASIC METABOLIC PANEL
ANION GAP: 8 (ref 5–15)
BUN: 33 mg/dL — ABNORMAL HIGH (ref 6–20)
CALCIUM: 9.4 mg/dL (ref 8.9–10.3)
CO2: 29 mmol/L (ref 22–32)
CREATININE: 1.96 mg/dL — AB (ref 0.61–1.24)
Chloride: 104 mmol/L (ref 101–111)
GFR, EST AFRICAN AMERICAN: 35 mL/min — AB (ref >60)
GFR, EST NON AFRICAN AMERICAN: 30 mL/min — AB (ref >60)
Glucose, Bld: 102 mg/dL — ABNORMAL HIGH (ref 65–99)
Potassium: 4.3 mmol/L (ref 3.5–5.1)
SODIUM: 141 mmol/L (ref 135–145)

## 2015-03-17 LAB — GLUCOSE, CAPILLARY: Glucose-Capillary: 99 mg/dL (ref 65–99)

## 2015-03-17 LAB — PROTIME-INR
INR: 2.2 — AB (ref 0.00–1.49)
PROTHROMBIN TIME: 24.3 s — AB (ref 11.6–15.2)

## 2015-03-17 NOTE — Progress Notes (Addendum)
ANTICOAGULATION CONSULT NOTE - Initial Consult  Pharmacy Consult for Heparin when INR < 2 Indication: chest pain/ACS  Allergies  Allergen Reactions  . Carisoprodol     REACTION: rash  . Lisinopril     REACTION: cough  . Tape     Cloth Tape tears skin  . Zolpidem Tartrate Other (See Comments)    Altered mental status  . Penicillins Rash    Patient Measurements: Height: 6\' 1"  (185.4 cm) Weight: 187 lb 2.7 oz (84.9 kg) IBW/kg (Calculated) : 79.9 Heparin Dosing Weight: 84 kg   Vital Signs: Temp: 98.4 F (36.9 C) (02/11 0905) Temp Source: Oral (02/11 0905) BP: 120/56 mmHg (02/11 0905) Pulse Rate: 60 (02/11 0905)  Labs:  Recent Labs  03/15/15 0506 03/15/15 1116 03/15/15 1543 03/15/15 2217 03/16/15 0500 03/17/15 0416  HGB 12.7*  --   --   --  12.4*  --   HCT 38.7*  --   --   --  39.3  --   PLT 162  --   --   --  164  --   LABPROT 28.2* 26.8*  --   --  26.6* 24.3*  INR 2.69* 2.52*  --   --  2.49* 2.20*  CREATININE 2.40*  --   --   --  2.15* 1.96*  TROPONINI  --  0.06* 0.06* 0.06*  --   --     Estimated Creatinine Clearance: 33.4 mL/min (by C-G formula based on Cr of 1.96).   Medical History: Past Medical History  Diagnosis Date  . HYPERLIPIDEMIA 04/07/2008  . HYPERTENSION 04/07/2008  . CAD, ARTERY BYPASS GRAFT 05/22/2008  . CARDIOMYOPATHY, ISCHEMIC 05/22/2008  . MITRAL VALVE PROLAPSE, NON-RHEUMATIC 05/22/2008  . AV BLOCK 05/22/2008  . Chronic systolic heart failure ([place]) 10/13/2008  . CAROTID ARTERY STENOSIS 11/15/2009  . GERD 04/07/2008  . RENAL INSUFFICIENCY 10/17/2008  . DEGENERATIVE JOINT DISEASE, LEFT KNEE 04/07/2008  . COLONIC POLYPS, HX OF 04/07/2008  . DIVERTICULITIS, HX OF 04/07/2008  . Atrial fibrillation ([place]) 04/04/2010  . Decreased left ventricular function   . Ventricular tachycardia ([place])     Status post catheter ablation may 2013  . CHF (congestive heart failure) ([facility])     class 2 to 3  . Shortness of breath dyspnea     Medications:  Prescriptions  prior to admission  Medication Sig Dispense Refill Last Dose  . atorvastatin (LIPITOR) 20 MG tablet TAKE 1 TABLET BY MOUTH EVERY DAY 30 tablet 11 03/14/2015 at Unknown time  . carvedilol (COREG) 25 MG tablet TAKE 1 TABLET BY MOUTH TWICE DAILY 180 tablet 3 03/14/2015 at 1800  . donepezil (ARICEPT) 10 MG tablet Take 1 tablet (10 mg total) by mouth at bedtime. 90 tablet 3 03/14/2015 at Unknown time  . fenofibrate 160 MG tablet Take 1 tablet (160 mg total) by mouth daily. 60 tablet 5 03/14/2015 at Unknown time  . furosemide (LASIX) 40 MG tablet TAKE 2 TABLETS BY MOUTH EVERY MORNING AND 1 TABLET BY MOUTH EVERY AFTERNOON 270 tablet 0 03/14/2015 at Unknown time  . isosorbide mononitrate (IMDUR) 30 MG 24 hr tablet Take 1 tablet (30 mg total) by mouth daily. With 60mg  tablet to equal 90mg  daily 90 tablet 1 03/14/2015 at Unknown time  . isosorbide mononitrate (IMDUR) 60 MG 24 hr tablet Take 1 tablet (60 mg total) by mouth daily. (Patient taking differently: Take 60 mg by mouth daily. Take with isosorbide 30 mg) 90 tablet 1 03/14/2015 at Unknown time  . losartan (COZAAR)  50 MG tablet Take 1 tablet (50 mg total) by mouth daily. Pt. Needs f/u appt. 90 tablet 0 03/14/2015 at Unknown time  . nitroGLYCERIN (NITROSTAT) 0.4 MG SL tablet PLACE 1 TABLET UNDER THE TONGUE EVERY 5 MINUTES AS NEEDED FOR CHEST PAIN 100 tablet 0 03/15/2015 at Unknown time  . omeprazole (PRILOSEC) 20 MG capsule TAKE ONE CAPSULE BY MOUTH TWICE DAILY 60 capsule 5 03/14/2015 at Unknown time  . potassium chloride SA (K-DUR,KLOR-CON) 20 MEQ tablet TAKE 1 TABLET BY MOUTH TWICE DAILY 60 tablet 3 03/14/2015 at Unknown time  . warfarin (COUMADIN) 5 MG tablet Take 2.5-5 mg by mouth daily. Take 5 mg on Monday and Friday then take 2.5 mg all the other days   03/14/2015 at Unknown time  . benzonatate (TESSALON) 100 MG capsule Take 1 capsule (100 mg total) by mouth 2 (two) times daily as needed for cough. (Patient not taking: Reported on 03/15/2015) 20 capsule 0 Completed Course at  Unknown time  . donepezil (ARICEPT) 5 MG tablet TAKE 1 TABLET(5 MG) BY MOUTH AT BEDTIME (Patient not taking: Reported on 03/15/2015) 90 tablet 2 Not Taking at Unknown time  . fenofibrate 160 MG tablet TAKE 1 TABLET BY MOUTH EVERY DAY (Patient not taking: Reported on 03/15/2015) 60 tablet 5 Not Taking at Unknown time  . warfarin (COUMADIN) 5 MG tablet TAKE AS DIRECTED BY COUMADIN CLINIC (Patient not taking: Reported on 03/15/2015) 90 tablet 1 Not Taking at Unknown time    Assessment: 23 YOM who presented with chest pain concerning for UA. Planning LHC Monday when INR falls below 1.8.   INR 2.2 this AM. CBC stable.  Goal of Therapy:  Heparin level 0.3-0.7 units/ml Monitor platelets by anticoagulation protocol: Yes   Plan:  Continue to hold heparin.  Daily INR Start IV heparin when INR < 2.    Heloise Ochoa, Pharm.D., BCPS PGY2 Cardiology Pharmacy Resident Pager: [phone] 03/17/2015, 11:04 AM

## 2015-03-17 NOTE — Progress Notes (Addendum)
SUBJECTIVE: The patient is doing well today.  At this time, he denies chest pain, shortness of breath, or any new concerns.  Marland Kitchen aspirin EC  81 mg Oral Daily  . atorvastatin  20 mg Oral q1800  . carvedilol  25 mg Oral BID  . donepezil  10 mg Oral QHS  . fenofibrate  160 mg Oral Daily  . furosemide  80 mg Oral q morning - 10a  . isosorbide mononitrate  90 mg Oral Daily  . pantoprazole  40 mg Oral Daily  . potassium chloride SA  20 mEq Oral BID  . sodium chloride flush  3 mL Intravenous Q12H      OBJECTIVE: Physical Exam: Filed Vitals:   03/16/15 1358 03/16/15 1934 03/17/15 0444 03/17/15 0905  BP: 105/49 114/48 117/55 120/56  Pulse: 59 62 60 60  Temp: 98 F (36.7 C) 98.5 F (36.9 C) 98.1 F (36.7 C) 98.4 F (36.9 C)  TempSrc: Oral Oral Oral Oral  Resp: 15 17 16 16   Height:      Weight:   187 lb 2.7 oz (84.9 kg)   SpO2: 96% 96% 96% 98%    Intake/Output Summary (Last 24 hours) at 03/17/15 [id number] Last data filed at 03/17/15 [id number]  Gross per 24 hour  Intake    720 ml  Output      0 ml  Net    720 ml    Telemetry reveals AV paced rhythm  GEN- The patient is well appearing, alert and oriented x 3 today.   Head- normocephalic, atraumatic Eyes-  Sclera clear, conjunctiva pink Ears- hearing intact Oropharynx- clear Neck- supple,  Lungs- Clear to ausculation bilaterally, normal work of breathing Heart- Regular rate and rhythm, no murmurs, rubs or gallops, PMI not laterally displaced GI- soft, NT, ND, + BS Extremities- no clubbing, cyanosis, or edema Skin- no rash or lesion Psych- euthymic mood, full affect Neuro- strength and sensation are intact  LABS: Basic Metabolic Panel:  Recent Labs  03/16/15 0500 03/17/15 0416  NA 143 141  K 4.0 4.3  CL 103 104  CO2 30 29  GLUCOSE 105* 102*  BUN 36* 33*  CREATININE 2.15* 1.96*  CALCIUM 9.4 9.4   Liver Function Tests:  Recent Labs  03/15/15 0506 03/16/15 0500  AST 30 27  ALT 18 18  ALKPHOS 30* 29*  BILITOT  0.9 0.9  PROT 6.9 7.1  ALBUMIN 3.5 3.4*   No results for input(s): LIPASE, AMYLASE in the last 72 hours. CBC:  Recent Labs  03/15/15 0506 03/16/15 0500  WBC 5.9 5.5  NEUTROABS 3.1  --   HGB 12.7* 12.4*  HCT 38.7* 39.3  MCV 86.4 86.8  PLT 162 164   Cardiac Enzymes:  Recent Labs  03/15/15 1116 03/15/15 1543 03/15/15 2217  TROPONINI 0.06* 0.06* 0.06*   BNP: Invalid input(s): POCBNP D-Dimer: No results for input(s): DDIMER in the last 72 hours. Hemoglobin A1C:  Recent Labs  03/15/15 1120  HGBA1C 6.1*   Fasting Lipid Panel:  Recent Labs  03/16/15 0500  CHOL 139  HDL 32*  LDLCALC 86  TRIG 105  CHOLHDL 4.3   Thyroid Function Tests:  Recent Labs  03/15/15 1543  TSH 1.239   Anemia Panel: No results for input(s): VITAMINB12, FOLATE, FERRITIN, TIBC, IRON, RETICCTPCT in the last 72 hours.  RADIOLOGY: Dg Chest Portable 1 View  03/15/2015  CLINICAL DATA:  Acute onset of bilateral arm pain. Mid chest pain. Chronic shortness of breath. Initial encounter. EXAM: PORTABLE  CHEST 1 VIEW COMPARISON:  Chest radiograph performed 02/06/2009 FINDINGS: The lungs are well-aerated. Mild right midlung opacity may reflect atelectasis or possibly mild pneumonia. There is no evidence of pleural effusion or pneumothorax. The cardiomediastinal silhouette is borderline normal in size. The patient is status post median sternotomy. A pacemaker/AICD is noted overlying the right chest wall, with leads ending overlying the right atrium and right ventricle. No acute osseous abnormalities are seen. IMPRESSION: Mild right midlung opacity may reflect atelectasis or possibly mild pneumonia. Followup PA and lateral chest X-ray is recommended in 3-4 weeks following trial of antibiotic therapy to ensure resolution and exclude underlying malignancy. If the patient does not have symptoms of pneumonia, a PA view of the chest could be considered for further evaluation Electronically Signed   By: Garald Balding M.D.   On: 03/15/2015 05:32    ASSESSMENT AND PLAN:  Active Problems:   Unstable angina ([place])  1.  [place]  Clinically improved Consider heparin drip once INR <2 Awaiting cath on Monday On ASA,coreg and statin  2. Chronic stage III renal failure Will hydrate overnight on Sunday for cath  3. Paroxysmal atrial fibrillation Coumadin on hold for cath  4. chronic systolic congestive heart failure euvolemic Will gently hydrate for cath Most recent BiV ICD remote reviewed and reveals normal device function Will enroll in ICM device clinic upon discharge   Thompson Grayer, MD 03/17/2015 9:37 AM

## 2015-03-17 NOTE — Plan of Care (Signed)
Problem: Pain Managment: Goal: General experience of comfort will improve Outcome: Completed/Met Date Met:  03/17/15 Pt educated on pain scale and interventions. Pt verbalized understanding.      

## 2015-03-17 NOTE — Plan of Care (Signed)
Problem: Safety: Goal: Ability to remain free from injury will improve Outcome: Completed/Met Date Met:  03/17/15 Pt educated on safety measures put into place. Pt verbalized understanding.      

## 2015-03-18 ENCOUNTER — Inpatient Hospital Stay (HOSPITAL_COMMUNITY): Payer: Medicare Other

## 2015-03-18 ENCOUNTER — Other Ambulatory Visit (HOSPITAL_COMMUNITY): Payer: Medicare Other

## 2015-03-18 DIAGNOSIS — R079 Chest pain, unspecified: Secondary | ICD-10-CM

## 2015-03-18 LAB — BASIC METABOLIC PANEL
Anion gap: 7 (ref 5–15)
BUN: 31 mg/dL — AB (ref 6–20)
CALCIUM: 9.6 mg/dL (ref 8.9–10.3)
CO2: 30 mmol/L (ref 22–32)
CREATININE: 1.93 mg/dL — AB (ref 0.61–1.24)
Chloride: 103 mmol/L (ref 101–111)
GFR calc non Af Amer: 31 mL/min — ABNORMAL LOW (ref >60)
GFR, EST AFRICAN AMERICAN: 36 mL/min — AB (ref >60)
Glucose, Bld: 102 mg/dL — ABNORMAL HIGH (ref 65–99)
Potassium: 4.1 mmol/L (ref 3.5–5.1)
SODIUM: 140 mmol/L (ref 135–145)

## 2015-03-18 LAB — CBC
HCT: 37.2 % — ABNORMAL LOW (ref 39.0–52.0)
Hemoglobin: 12.2 g/dL — ABNORMAL LOW (ref 13.0–17.0)
MCH: 28 pg (ref 26.0–34.0)
MCHC: 32.8 g/dL (ref 30.0–36.0)
MCV: 85.5 fL (ref 78.0–100.0)
PLATELETS: 164 10*3/uL (ref 150–400)
RBC: 4.35 MIL/uL (ref 4.22–5.81)
RDW: 14.1 % (ref 11.5–15.5)
WBC: 7.8 10*3/uL (ref 4.0–10.5)

## 2015-03-18 LAB — PROTIME-INR
INR: 1.82 — ABNORMAL HIGH (ref 0.00–1.49)
PROTHROMBIN TIME: 21.1 s — AB (ref 11.6–15.2)

## 2015-03-18 LAB — GLUCOSE, CAPILLARY: Glucose-Capillary: 103 mg/dL — ABNORMAL HIGH (ref 65–99)

## 2015-03-18 LAB — HEPARIN LEVEL (UNFRACTIONATED): HEPARIN UNFRACTIONATED: 0.3 [IU]/mL (ref 0.30–0.70)

## 2015-03-18 MED ORDER — SODIUM CHLORIDE 0.9% FLUSH
3.0000 mL | Freq: Two times a day (BID) | INTRAVENOUS | Status: DC
  Administered 2015-03-19: 3 mL via INTRAVENOUS

## 2015-03-18 MED ORDER — SODIUM CHLORIDE 0.9% FLUSH: 3.0000 mL | INTRAVENOUS | Status: DC | PRN

## 2015-03-18 MED ORDER — ASPIRIN 81 MG PO CHEW
81.0000 mg | CHEWABLE_TABLET | ORAL | Status: AC
  Administered 2015-03-19: 81 mg via ORAL
  Filled 2015-03-18: qty 1

## 2015-03-18 MED ORDER — SODIUM CHLORIDE 0.9 % IV SOLN: 250.0000 mL | INTRAVENOUS | Status: DC | PRN

## 2015-03-18 MED ORDER — SODIUM CHLORIDE 0.9 % IV SOLN
INTRAVENOUS | Status: DC
  Administered 2015-03-19: 01:00:00 via INTRAVENOUS

## 2015-03-18 MED ORDER — SODIUM CHLORIDE 0.9% FLUSH: 3.0000 mL | Freq: Two times a day (BID) | INTRAVENOUS | Status: DC

## 2015-03-18 MED ORDER — HEPARIN (PORCINE) IN NACL 100-0.45 UNIT/ML-% IJ SOLN
1200.0000 [IU]/h | INTRAMUSCULAR | Status: DC
  Administered 2015-03-18: 1200 [IU]/h via INTRAVENOUS
  Filled 2015-03-18: qty 250

## 2015-03-18 MED ORDER — HEPARIN (PORCINE) IN NACL 100-0.45 UNIT/ML-% IJ SOLN
1250.0000 [IU]/h | INTRAMUSCULAR | Status: DC
  Administered 2015-03-18 – 2015-03-19 (×2): 1250 [IU]/h via INTRAVENOUS
  Filled 2015-03-18: qty 250

## 2015-03-18 MED ORDER — SODIUM CHLORIDE 0.9 % IV SOLN: INTRAVENOUS | Status: DC

## 2015-03-18 MED ORDER — ASPIRIN 81 MG PO CHEW: 81.0000 mg | CHEWABLE_TABLET | ORAL | Status: DC

## 2015-03-18 NOTE — Progress Notes (Signed)
Patient Name: Sean [name] Date of Encounter: 03/18/2015  Active Problems:   Unstable angina ([place])   Length of Stay: 3  SUBJECTIVE  No further angina  CURRENT MEDS . aspirin EC  81 mg Oral Daily  . atorvastatin  20 mg Oral q1800  . carvedilol  25 mg Oral BID  . donepezil  10 mg Oral QHS  . fenofibrate  160 mg Oral Daily  . furosemide  80 mg Oral q morning - 10a  . isosorbide mononitrate  90 mg Oral Daily  . pantoprazole  40 mg Oral Daily  . potassium chloride SA  20 mEq Oral BID  . sodium chloride flush  3 mL Intravenous Q12H    OBJECTIVE   Intake/Output Summary (Last 24 hours) at 03/18/15 0910 Last data filed at 03/17/15 1808  Gross per 24 hour  Intake    480 ml  Output      0 ml  Net    480 ml   Filed Weights   03/16/15 0328 03/17/15 0444 03/18/15 0500  Weight: 83.961 kg (185 lb 1.6 oz) 84.9 kg (187 lb 2.7 oz) 84.188 kg (185 lb 9.6 oz)    PHYSICAL EXAM Filed Vitals:   03/17/15 1313 03/17/15 2153 03/18/15 0500 03/18/15 0855  BP: 106/46 104/49 122/60 127/43  Pulse: 58 60 60   Temp: 98 F (36.7 C) 98 F (36.7 C) 98 F (36.7 C)   TempSrc: Oral Oral Oral   Resp:      Height:      Weight:   84.188 kg (185 lb 9.6 oz)   SpO2: 96% 96% 97%    General: Alert, oriented x3, no distress Head: no evidence of trauma, PERRL, EOMI, no exophtalmos or lid lag, no myxedema, no xanthelasma; normal ears, nose and oropharynx Neck: normal jugular venous pulsations and no hepatojugular reflux; brisk carotid pulses without delay and no carotid bruits Chest: clear to auscultation, no signs of consolidation by percussion or palpation, normal fremitus, symmetrical and full respiratory excursions Cardiovascular: normal position and quality of the apical impulse, regular rhythm, normal first and second heart sounds, no rubs or gallops, 2/6 systolic murmur Abdomen: no tenderness or distention, no masses by palpation, no abnormal pulsatility or arterial bruits, normal bowel sounds,  no hepatosplenomegaly Extremities: no clubbing, cyanosis or edema; 2+ radial, ulnar and brachial pulses bilaterally; 2+ right femoral, posterior tibial and dorsalis pedis pulses; 2+ left femoral, posterior tibial and dorsalis pedis pulses; no subclavian or femoral bruits Neurological: grossly nonfocal  LABS  CBC  Recent Labs  03/16/15 0500  WBC 5.5  HGB 12.4*  HCT 39.3  MCV 86.8  PLT [id number]   Basic Metabolic Panel  Recent Labs  03/17/15 0416 03/18/15 0300  NA 141 140  K 4.3 4.1  CL 104 103  CO2 29 30  GLUCOSE 102* 102*  BUN 33* 31*  CREATININE 1.96* 1.93*  CALCIUM 9.4 9.6   Liver Function Tests  Recent Labs  03/16/15 0500  AST 27  ALT 18  ALKPHOS 29*  BILITOT 0.9  PROT 7.1  ALBUMIN 3.4*   No results for input(s): LIPASE, AMYLASE in the last 72 hours. Cardiac Enzymes  Recent Labs  03/15/15 1116 03/15/15 1543 03/15/15 2217  TROPONINI 0.06* 0.06* 0.06*   BNP Invalid input(s): POCBNP D-Dimer No results for input(s): DDIMER in the last 72 hours. Hemoglobin A1C  Recent Labs  03/15/15 1120  HGBA1C 6.1*   Fasting Lipid Panel  Recent Labs  03/16/15 0500  CHOL 139  HDL 32*  LDLCALC 86  TRIG 105  CHOLHDL 4.3   Thyroid Function Tests  Recent Labs  03/15/15 1543  TSH 1.239    Radiology Studies Imaging results have been reviewed and No results found.  TELE A paced BiV paced  ECG nondiagnostic due to paced rhythm  ASSESSMENT AND PLAN   1. Unstable angina - s/p CABG; INR now 1.8 and creat improving. Plan for cath =/- PCI tomorrow. This procedure has been fully reviewed with the patient and informed consent has been obtained. 2. CKD 3. PAF - warfarin on hold 4. Chronic systolic HF - appears to be euvolemic, avoid excessive IV fluids, hydrate post-cath based on LVEDP. Losartan on hold for cath 5. S/p CRT-D  Plan repeat CXR before DC to clarify possible infiltrate  Sanda Klein, MD, [facility] [phone]  office [phone] pager 03/18/2015 9:10 AM

## 2015-03-18 NOTE — Progress Notes (Signed)
  Echocardiogram 2D Echocardiogram has been performed.  Sean [name] 03/18/2015, 2:03 PM

## 2015-03-18 NOTE — Progress Notes (Signed)
ANTICOAGULATION CONSULT NOTE - Follow Up Consult  Pharmacy Consult for heparin Indication: chest pain/ACS and atrial fibrillation   Labs:  Recent Labs  03/15/15 1543 03/15/15 2217 03/16/15 0500 03/17/15 0416 03/18/15 0300 03/18/15 1420  HGB  --   --  12.4*  --   --   --   HCT  --   --  39.3  --   --   --   PLT  --   --  164  --   --   --   LABPROT  --   --  26.6* 24.3* 21.1*  --   INR  --   --  2.49* 2.20* 1.82*  --   HEPARINUNFRC  --   --   --   --   --  0.30  CREATININE  --   --  2.15* 1.96* 1.93*  --   TROPONINI 0.06* 0.06*  --   --   --   --      Assessment: 82yo man admitted 03/15/2015 for UA also on warfarin PTA for afib. CHADS2VASc at least 4. Pharmacy consulted to dose heparin.  PMH CAD s/p CABG, ICM, ICD, CKD, HTN, HLD, VT s/p ablation, PAF, HFrEF (EF20-25%)   Initial HL 0.3 therapeutic at lower end of range on heparin 1200 units/h. No noted bleeding.  Goal of Therapy:  Heparin level 0.3-0.7 units/ml Monitor platelets by anticoagulation protocol: Yes   Plan:  Heparin 1250 units/h 2330 confirmatory HL with CBC Daily HL, CBC Monitor s/sx bleeding   Heloise Ochoa, Pharm.D., BCPS PGY2 Cardiology Pharmacy Resident Pager: [phone]  03/18/2015,3:07 PM

## 2015-03-18 NOTE — Progress Notes (Signed)
ANTICOAGULATION CONSULT NOTE - Follow Up Consult  Pharmacy Consult for heparin Indication: chest pain/ACS and atrial fibrillation   Labs:  Recent Labs  03/15/15 1116 03/15/15 1543 03/15/15 2217 03/16/15 0500 03/17/15 0416 03/18/15 0300  HGB  --   --   --  12.4*  --   --   HCT  --   --   --  39.3  --   --   PLT  --   --   --  164  --   --   LABPROT 26.8*  --   --  26.6* 24.3* 21.1*  INR 2.52*  --   --  2.49* 2.20* 1.82*  CREATININE  --   --   --  2.15* 1.96* 1.93*  TROPONINI 0.06* 0.06* 0.06*  --   --   --      Assessment: 82yo male now w/ INR <2 to begin heparin bridge from Coumadin for Afib while awaiting LHC.  Goal of Therapy:  Heparin level 0.3-0.7 units/ml Monitor platelets by anticoagulation protocol: Yes   Plan:  Will begin heparin gtt at 1200 units/hr and monitor heparin levels and CBC.  Wynona Neat, PharmD, BCPS  03/18/2015,5:07 AM

## 2015-03-19 ENCOUNTER — Encounter (HOSPITAL_COMMUNITY)
Admission: EM | Disposition: A | Discharge status: Home / Self Care | Payer: Self-pay | Source: Home / Self Care | Attending: Cardiology

## 2015-03-19 ENCOUNTER — Inpatient Hospital Stay (HOSPITAL_COMMUNITY): Payer: Medicare Other

## 2015-03-19 DIAGNOSIS — E785 Hyperlipidemia, unspecified: Secondary | ICD-10-CM

## 2015-03-19 DIAGNOSIS — R9389 Abnormal findings on diagnostic imaging of other specified body structures: Secondary | ICD-10-CM | POA: Diagnosis present

## 2015-03-19 DIAGNOSIS — I25710 Atherosclerosis of autologous vein coronary artery bypass graft(s) with unstable angina pectoris: Secondary | ICD-10-CM

## 2015-03-19 DIAGNOSIS — I25700 Atherosclerosis of coronary artery bypass graft(s), unspecified, with unstable angina pectoris: Secondary | ICD-10-CM | POA: Insufficient documentation

## 2015-03-19 DIAGNOSIS — I10 Essential (primary) hypertension: Secondary | ICD-10-CM

## 2015-03-19 DIAGNOSIS — I472 Ventricular tachycardia: Secondary | ICD-10-CM

## 2015-03-19 HISTORY — PX: CARDIAC CATHETERIZATION: SHX172

## 2015-03-19 LAB — CBC
HCT: 39.3 % (ref 39.0–52.0)
Hemoglobin: 12.5 g/dL — ABNORMAL LOW (ref 13.0–17.0)
MCH: 27.3 pg (ref 26.0–34.0)
MCHC: 31.8 g/dL (ref 30.0–36.0)
MCV: 85.8 fL (ref 78.0–100.0)
PLATELETS: 151 10*3/uL (ref 150–400)
RBC: 4.58 MIL/uL (ref 4.22–5.81)
RDW: 14.3 % (ref 11.5–15.5)
WBC: 8.7 10*3/uL (ref 4.0–10.5)

## 2015-03-19 LAB — BASIC METABOLIC PANEL
ANION GAP: 10 (ref 5–15)
BUN: 31 mg/dL — ABNORMAL HIGH (ref 6–20)
CO2: 25 mmol/L (ref 22–32)
Calcium: 9.7 mg/dL (ref 8.9–10.3)
Chloride: 103 mmol/L (ref 101–111)
Creatinine, Ser: 2.03 mg/dL — ABNORMAL HIGH (ref 0.61–1.24)
GFR, EST AFRICAN AMERICAN: 34 mL/min — AB (ref >60)
GFR, EST NON AFRICAN AMERICAN: 29 mL/min — AB (ref >60)
GLUCOSE: 114 mg/dL — AB (ref 65–99)
POTASSIUM: 4 mmol/L (ref 3.5–5.1)
Sodium: 138 mmol/L (ref 135–145)

## 2015-03-19 LAB — PROTIME-INR
INR: 1.52 — AB (ref 0.00–1.49)
PROTHROMBIN TIME: 18.3 s — AB (ref 11.6–15.2)

## 2015-03-19 LAB — HEPARIN LEVEL (UNFRACTIONATED)
HEPARIN UNFRACTIONATED: 0.44 [IU]/mL (ref 0.30–0.70)
Heparin Unfractionated: 0.37 IU/mL (ref 0.30–0.70)

## 2015-03-19 LAB — POCT ACTIVATED CLOTTING TIME: ACTIVATED CLOTTING TIME: 142 s

## 2015-03-19 SURGERY — LEFT HEART CATH AND CORONARY ANGIOGRAPHY
Anesthesia: LOCAL

## 2015-03-19 MED ORDER — FENTANYL CITRATE (PF) 100 MCG/2ML IJ SOLN
INTRAMUSCULAR | Status: DC | PRN
  Administered 2015-03-19: 25 ug via INTRAVENOUS

## 2015-03-19 MED ORDER — SODIUM CHLORIDE 0.9 % IV SOLN: INTRAVENOUS | Status: DC

## 2015-03-19 MED ORDER — DIAZEPAM 2 MG PO TABS
2.0000 mg | ORAL_TABLET | Freq: Four times a day (QID) | ORAL | Status: DC | PRN
Start: 2015-03-19 — End: 2015-03-20

## 2015-03-19 MED ORDER — MIDAZOLAM HCL 2 MG/2ML IJ SOLN
INTRAMUSCULAR | Status: DC | PRN
  Administered 2015-03-19: 1 mg via INTRAVENOUS

## 2015-03-19 MED ORDER — MIDAZOLAM HCL 2 MG/2ML IJ SOLN
INTRAMUSCULAR | Status: AC
  Filled 2015-03-19: qty 2

## 2015-03-19 MED ORDER — SODIUM CHLORIDE 0.9 % IV SOLN: 250.0000 mL | INTRAVENOUS | Status: DC | PRN

## 2015-03-19 MED ORDER — SODIUM CHLORIDE 0.9% FLUSH: 3.0000 mL | INTRAVENOUS | Status: DC | PRN

## 2015-03-19 MED ORDER — SODIUM CHLORIDE 0.9% FLUSH
3.0000 mL | Freq: Two times a day (BID) | INTRAVENOUS | Status: DC
  Administered 2015-03-19: 3 mL via INTRAVENOUS

## 2015-03-19 MED ORDER — HEPARIN (PORCINE) IN NACL 2-0.9 UNIT/ML-% IJ SOLN
INTRAMUSCULAR | Status: AC
  Filled 2015-03-19: qty 1000

## 2015-03-19 MED ORDER — IOHEXOL 350 MG/ML SOLN
INTRAVENOUS | Status: DC | PRN
  Administered 2015-03-19: 85 mL via INTRA_ARTERIAL

## 2015-03-19 MED ORDER — LIDOCAINE HCL (PF) 1 % IJ SOLN
INTRAMUSCULAR | Status: AC
  Filled 2015-03-19: qty 30

## 2015-03-19 MED ORDER — ACETAMINOPHEN 325 MG PO TABS: 650.0000 mg | ORAL_TABLET | ORAL | Status: DC | PRN

## 2015-03-19 MED ORDER — ONDANSETRON HCL 4 MG/2ML IJ SOLN: 4.0000 mg | Freq: Four times a day (QID) | INTRAMUSCULAR | Status: DC | PRN

## 2015-03-19 MED ORDER — FENTANYL CITRATE (PF) 100 MCG/2ML IJ SOLN
INTRAMUSCULAR | Status: AC
  Filled 2015-03-19: qty 2

## 2015-03-19 MED ORDER — HEPARIN (PORCINE) IN NACL 2-0.9 UNIT/ML-% IJ SOLN
INTRAMUSCULAR | Status: DC | PRN
  Administered 2015-03-19: 1000 mL

## 2015-03-19 SURGICAL SUPPLY — 10 items
CATH INFINITI 5 FR IM (CATHETERS) ×1 IMPLANT
CATH INFINITI 5 FR RCB (CATHETERS) ×1 IMPLANT
CATH INFINITI 5FR MULTPACK ANG (CATHETERS) ×1 IMPLANT
GUIDEWIRE ANGLED .035X150CM (WIRE) IMPLANT
KIT HEART LEFT (KITS) ×2 IMPLANT
PACK CARDIAC CATHETERIZATION (CUSTOM PROCEDURE TRAY) ×2 IMPLANT
SHEATH PINNACLE 5F 10CM (SHEATH) ×1 IMPLANT
SYR MEDRAD MARK V 150ML (SYRINGE) ×2 IMPLANT
TRANSDUCER W/STOPCOCK (MISCELLANEOUS) ×2 IMPLANT
WIRE EMERALD 3MM-J .035X150CM (WIRE) ×1 IMPLANT

## 2015-03-19 NOTE — Progress Notes (Signed)
ANTICOAGULATION CONSULT NOTE - Follow Up Consult  Pharmacy Consult for heparin Indication: chest pain/ACS and atrial fibrillation   Labs:  Recent Labs  03/17/15 0416 03/18/15 0300 03/18/15 1420 03/18/15 1530 03/18/15 2306 03/19/15 0338 03/19/15 0455  HGB  --   --   --  12.2*  --  12.5*  --   HCT  --   --   --  37.2*  --  39.3  --   PLT  --   --   --  164  --  151  --   LABPROT 24.3* 21.1*  --   --   --  18.3*  --   INR 2.20* 1.82*  --   --   --  1.52*  --   HEPARINUNFRC  --   --  0.30  --  0.37 0.44  --   CREATININE 1.96* 1.93*  --   --   --   --  2.03*     Assessment: 82yo man admitted 03/15/2015 for UA also on warfarin PTA for afib. CHADS2VASc at least 4. Pharmacy consulted to dose heparin.  PMH CAD s/p CABG, ICM, ICD, CKD, HTN, HLD, VT s/p ablation, PAF, HFrEF (EF20-25%)   Heparin level is therapeutic on heparin 1250 units/h. No noted bleeding.  Goal of Therapy:  Heparin level 0.3-0.7 units/ml Monitor platelets by anticoagulation protocol: Yes   Plan:  Heparin 1250 units/hr Daily HL, CBC Monitor s/sx bleeding F/u after cath    Hughes Better, PharmD, BCPS Clinical Pharmacist Pager: [phone] 03/19/2015 10:52 AM

## 2015-03-19 NOTE — Care Management Important Message (Signed)
Important Message  Patient Details  Name: Sean [name] MRN: [id number] Date of Birth: 12/22/1931   Medicare Important Message Given:  Yes    Erenest Rasher, RN 03/19/2015, 10:33 AM

## 2015-03-19 NOTE — Progress Notes (Signed)
Site area: rt groin Site Prior to Removal:  Level 0 Pressure Applied For:  20 minutes Manual:   yes Patient Status During Pull: stable  Post Pull Site:  Level  0 Post Pull Instructions Given:  yes Post Pull Pulses Present: yes Dressing Applied:  tegaderm Bedrest begins @  1315 Comments:   

## 2015-03-19 NOTE — Progress Notes (Signed)
ANTICOAGULATION CONSULT NOTE - Follow Up Consult  Pharmacy Consult for heparin Indication: chest pain/ACS and atrial fibrillation   Labs:  Recent Labs  03/16/15 0500 03/17/15 0416 03/18/15 0300 03/18/15 1420 03/18/15 1530 03/18/15 2306  HGB 12.4*  --   --   --  12.2*  --   HCT 39.3  --   --   --  37.2*  --   PLT 164  --   --   --  164  --   LABPROT 26.6* 24.3* 21.1*  --   --   --   INR 2.49* 2.20* 1.82*  --   --   --   HEPARINUNFRC  --   --   --  0.30  --  0.37  CREATININE 2.15* 1.96* 1.93*  --   --   --     Assessment/Plan:  82yo male therapeutic on heparin after rate increase. Will continue gtt at current rate and confirm stable with am labs.   Wynona Neat, PharmD, BCPS  03/19/2015,12:11 AM

## 2015-03-19 NOTE — H&P (View-Only) (Signed)
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 Has not walked around much (besides to [facility])  Inpatient Medications    . aspirin EC  81 mg Oral Daily  . atorvastatin  20 mg Oral q1800  . carvedilol  25 mg Oral BID  . donepezil  10 mg Oral QHS  . fenofibrate  160 mg Oral Daily  . furosemide  80 mg Oral q morning - 10a  . isosorbide mononitrate  90 mg Oral Daily  . pantoprazole  40 mg Oral Daily  . potassium chloride SA  20 mEq Oral BID  . sodium chloride flush  3 mL Intravenous Q12H    Vital Signs    Filed Vitals:   03/18/15 1346 03/18/15 1421 03/18/15 2125 03/19/15 0428  BP:  104/37 121/61 122/57  Pulse:  61 64 63  Temp:  98.3 F (36.8 C) 98.3 F (36.8 C) 98.4 F (36.9 C)  TempSrc:  Oral Oral Tympanic  Resp:   16   Height:      Weight: 185 lb 6.5 oz (84.1 kg)   184 lb 9.6 oz (83.734 kg)  SpO2:  96% 95% 95%    Intake/Output Summary (Last 24 hours) at 03/19/15 0846 Last data filed at 03/19/15 0430  Gross per 24 hour  Intake    830 ml  Output    825 ml  Net      5 ml   Filed Weights   03/18/15 0500 03/18/15 1346 03/19/15 0428  Weight: 185 lb 9.6 oz (84.188 kg) 185 lb 6.5 oz (84.1 kg) 184 lb 9.6 oz (83.734 kg)    Physical Exam    General: Pleasant, NAD. Neuro: Alert and oriented X 3. Moves all extremities  spontaneously. nonfocal Psych: Normal affect. HEENT:  Normal  Neck: Supple without bruits or JVD. Lungs:  Resp regular and unlabored, CTAB Heart: RRR (with ectopy) no s3, s4, 2/6 SEM. Abdomen: Soft, non-tender, non-distended, BS + x 4.  Extremities: No clubbing, cyanosis or edema. DP/PT/Radials 2+ and equal bilaterally.  Labs    CBC  Recent Labs  03/18/15 1530 03/19/15 0338  WBC 7.8 8.7  HGB 12.2* 12.5*  HCT 37.2* 39.3  MCV 85.5 85.8  PLT 164 123XX123   Basic Metabolic Panel  Recent Labs  03/18/15 0300 03/19/15 0455  NA 140 138  K 4.1 4.0  CL 103 103  CO2 30 25  GLUCOSE 102* 114*  BUN 31* 31*  CREATININE 1.93* 2.03*  CALCIUM 9.6 9.7   Liver Function Tests No results for input(s): AST, ALT, ALKPHOS, BILITOT, PROT, ALBUMIN in the last 72 hours. No results for input(s): LIPASE, AMYLASE in the last 72 hours. Cardiac Enzymes No results for input(s): CKTOTAL, CKMB, CKMBINDEX, TROPONINI in the last 72 hours. BNP Invalid input(s): POCBNP D-Dimer No results for input(s): DDIMER in the last 72 hours. Hemoglobin A1C No  results for input(s): HGBA1C in the last 72 hours. Fasting Lipid Panel No results for input(s): CHOL, HDL, LDLCALC, TRIG, CHOLHDL, LDLDIRECT in the last 72 hours. Thyroid Function Tests No results for input(s): TSH, T4TOTAL, T3FREE, THYROIDAB in the last 72 hours.  Invalid input(s): FREET3  Telemetry    NSR 60s with occasional 3-4 beats NSVT (occasional paced rhythm noted)   ECG    No new EKG  Radiology    Dg Chest Portable 1 View  03/15/2015  CLINICAL DATA:  Acute onset of bilateral arm pain. Mid chest pain. Chronic shortness of breath. Initial encounter. EXAM: PORTABLE CHEST 1 VIEW COMPARISON:  Chest radiograph performed 02/06/2009 FINDINGS: The lungs are well-aerated. Mild right midlung opacity may reflect atelectasis or possibly mild pneumonia. There is no evidence of pleural effusion or pneumothorax. The cardiomediastinal silhouette is borderline  normal in size. The patient is status post median sternotomy. A pacemaker/AICD is noted overlying the right chest wall, with leads ending overlying the right atrium and right ventricle. No acute osseous abnormalities are seen. IMPRESSION: Mild right midlung opacity may reflect atelectasis or possibly mild pneumonia. Followup PA and lateral chest X-ray is recommended in 3-4 weeks following trial of antibiotic therapy to ensure resolution and exclude underlying malignancy. If the patient does not have symptoms of pneumonia, a PA view of the chest could be considered for further evaluation Electronically Signed   By: Garald Balding M.D.   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 Claiborne Billings. (will need to be judicious with contrast given baseline CKD)  On IV Heparin for now  On BB, statin, asa + Imdur (2 drug antianginal Rx)  Active Problems:     Chronic combined systolic and diastolic heart failure (HCC) - on standing dose of Lasix - no sign of volume overload.    CKD (chronic kidney disease) stage 3, GFR 30-59 ml/min - Cr relatively stable ~2. Judicious use of contrast (if complex PCI - consider staged procedure).    Hyperlipidemia - on moderate dose statin (defer adjustment to High dose to primary cardiologist)    Essential hypertension - stable BP on Coreg.    Paroxysmal atrial fibrillation ([place])  /  Long term (current) use of anticoagulants - on BB for rate control.  Remains in NSR.  Will need to restart warfarin post Cath (INR is now ~1.5) - probably would not require bridging  If PCI - would consider Plavix + ASA x 1 month, then d/c ASA    Ventricular tachycardia (HCC) /Biventricular implantable cardioverter-defibrillator in situ (Ischemic CM) -- ICD in place.   On BB.  No sustained VT noted.  Will need f/u CXR - PA & Lat to reassess R Mid Lung opacity -- check prior to d/c   Signed, Ellyn Hack, Leonie Green, M.D., M.S. Interventional Cardiologist   Pager # [phone] Phone # [phone] [address] [place], [place] [address]

## 2015-03-19 NOTE — Progress Notes (Signed)
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 Has not walked around much (besides to [facility])  Inpatient Medications    . aspirin EC  81 mg Oral Daily  . atorvastatin  20 mg Oral q1800  . carvedilol  25 mg Oral BID  . donepezil  10 mg Oral QHS  . fenofibrate  160 mg Oral Daily  . furosemide  80 mg Oral q morning - 10a  . isosorbide mononitrate  90 mg Oral Daily  . pantoprazole  40 mg Oral Daily  . potassium chloride SA  20 mEq Oral BID  . sodium chloride flush  3 mL Intravenous Q12H    Vital Signs    Filed Vitals:   03/18/15 1346 03/18/15 1421 03/18/15 2125 03/19/15 0428  BP:  104/37 121/61 122/57  Pulse:  61 64 63  Temp:  98.3 F (36.8 C) 98.3 F (36.8 C) 98.4 F (36.9 C)  TempSrc:  Oral Oral Tympanic  Resp:   16   Height:      Weight: 185 lb 6.5 oz (84.1 kg)   184 lb 9.6 oz (83.734 kg)  SpO2:  96% 95% 95%    Intake/Output Summary (Last 24 hours) at 03/19/15 0846 Last data filed at 03/19/15 0430  Gross per 24 hour  Intake    830 ml  Output    825 ml  Net      5 ml   Filed Weights   03/18/15 0500 03/18/15 1346 03/19/15 0428  Weight: 185 lb 9.6 oz (84.188 kg) 185 lb 6.5 oz (84.1 kg) 184 lb 9.6 oz (83.734 kg)    Physical Exam    General: Pleasant, NAD. Neuro: Alert and oriented X 3. Moves all extremities  spontaneously. nonfocal Psych: Normal affect. HEENT:  Normal  Neck: Supple without bruits or JVD. Lungs:  Resp regular and unlabored, CTAB Heart: RRR (with ectopy) no s3, s4, 2/6 SEM. Abdomen: Soft, non-tender, non-distended, BS + x 4.  Extremities: No clubbing, cyanosis or edema. DP/PT/Radials 2+ and equal bilaterally.  Labs    CBC  Recent Labs  03/18/15 1530 03/19/15 0338  WBC 7.8 8.7  HGB 12.2* 12.5*  HCT 37.2* 39.3  MCV 85.5 85.8  PLT 164 123XX123   Basic Metabolic Panel  Recent Labs  03/18/15 0300 03/19/15 0455  NA 140 138  K 4.1 4.0  CL 103 103  CO2 30 25  GLUCOSE 102* 114*  BUN 31* 31*  CREATININE 1.93* 2.03*  CALCIUM 9.6 9.7   Liver Function Tests No results for input(s): AST, ALT, ALKPHOS, BILITOT, PROT, ALBUMIN in the last 72 hours. No results for input(s): LIPASE, AMYLASE in the last 72 hours. Cardiac Enzymes No results for input(s): CKTOTAL, CKMB, CKMBINDEX, TROPONINI in the last 72 hours. BNP Invalid input(s): POCBNP D-Dimer No results for input(s): DDIMER in the last 72 hours. Hemoglobin A1C No  results for input(s): HGBA1C in the last 72 hours. Fasting Lipid Panel No results for input(s): CHOL, HDL, LDLCALC, TRIG, CHOLHDL, LDLDIRECT in the last 72 hours. Thyroid Function Tests No results for input(s): TSH, T4TOTAL, T3FREE, THYROIDAB in the last 72 hours.  Invalid input(s): FREET3  Telemetry    NSR 60s with occasional 3-4 beats NSVT (occasional paced rhythm noted)   ECG    No new EKG  Radiology    Dg Chest Portable 1 View  03/15/2015  CLINICAL DATA:  Acute onset of bilateral arm pain. Mid chest pain. Chronic shortness of breath. Initial encounter. EXAM: PORTABLE CHEST 1 VIEW COMPARISON:  Chest radiograph performed 02/06/2009 FINDINGS: The lungs are well-aerated. Mild right midlung opacity may reflect atelectasis or possibly mild pneumonia. There is no evidence of pleural effusion or pneumothorax. The cardiomediastinal silhouette is borderline  normal in size. The patient is status post median sternotomy. A pacemaker/AICD is noted overlying the right chest wall, with leads ending overlying the right atrium and right ventricle. No acute osseous abnormalities are seen. IMPRESSION: Mild right midlung opacity may reflect atelectasis or possibly mild pneumonia. Followup PA and lateral chest X-ray is recommended in 3-4 weeks following trial of antibiotic therapy to ensure resolution and exclude underlying malignancy. If the patient does not have symptoms of pneumonia, a PA view of the chest could be considered for further evaluation Electronically Signed   By: Garald Balding M.D.   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 Claiborne Billings. (will need to be judicious with contrast given baseline CKD)  On IV Heparin for now  On BB, statin, asa + Imdur (2 drug antianginal Rx)  Active Problems:     Chronic combined systolic and diastolic heart failure (HCC) - on standing dose of Lasix - no sign of volume overload.    CKD (chronic kidney disease) stage 3, GFR 30-59 ml/min - Cr relatively stable ~2. Judicious use of contrast (if complex PCI - consider staged procedure).    Hyperlipidemia - on moderate dose statin (defer adjustment to High dose to primary cardiologist)    Essential hypertension - stable BP on Coreg.    Paroxysmal atrial fibrillation ([place])  /  Long term (current) use of anticoagulants - on BB for rate control.  Remains in NSR.  Will need to restart warfarin post Cath (INR is now ~1.5) - probably would not require bridging  If PCI - would consider Plavix + ASA x 1 month, then d/c ASA    Ventricular tachycardia (HCC) /Biventricular implantable cardioverter-defibrillator in situ (Ischemic CM) -- ICD in place.   On BB.  No sustained VT noted.  Will need f/u CXR - PA & Lat to reassess R Mid Lung opacity -- check prior to d/c   Signed, Ellyn Hack, Leonie Green, M.D., M.S. Interventional Cardiologist   Pager # [phone] Phone # [phone] [address] [place], [place] [address]

## 2015-03-19 NOTE — Interval H&P Note (Signed)
Cath Lab Visit (complete for each Cath Lab visit)  Clinical Evaluation Leading to the Procedure:   ACS: No.  Non-ACS:    Anginal Classification: CCS IV  Anti-ischemic medical therapy: Maximal Therapy (2 or more classes of medications)  Non-Invasive Test Results: No non-invasive testing performed  Prior CABG: Previous CABG      History and Physical Interval Note:  03/19/2015 11:50 AM  Sean [name]  has presented today for surgery, with the diagnosis of nstemi  The various methods of treatment have been discussed with the patient and family. After consideration of risks, benefits and other options for treatment, the patient has consented to  Procedure(s): Left Heart Cath and Coronary Angiography (N/A) as a surgical intervention .  The patient's history has been reviewed, patient examined, no change in status, stable for surgery.  I have reviewed the patient's chart and labs.  Questions were answered to the patient's satisfaction.     Sean [name] A

## 2015-03-19 NOTE — Care Management Note (Signed)
Case Management Note  Patient Details  Name: JERRION ARANA MRN: [id number] Date of Birth: 04/04/1931  Subjective/Objective:      Chest pain, hx CABG               Action/Plan: NCM spoke to pt and wife, Melo Osmani # [phone]. Pt is independent at home and does not use any DME at home. Pt does daily weights. Wife states they can afford medications. Waiting final recommendations for home. PCP Dr Elease Hashimoto- last visit two weeks   Expected Discharge Date:  03/20/2015               Expected Discharge Plan:  Home/Self Care  In-House Referral:  NA  Discharge planning Services  CM Consult  Post Acute Care Choice:  NA Choice offered to:  NA  DME Arranged:  N/A DME Agency:  NA  HH Arranged:  NA HH Agency:  NA  Status of Service:  Completed, signed off  Medicare Important Message Given:  Yes Date Medicare IM Given:    Medicare IM give by:    Date Additional Medicare IM Given:    Additional Medicare Important Message give by:     If discussed at [place] of Stay Meetings, dates discussed:    Additional Comments:  Erenest Rasher, RN 03/19/2015, 10:37 AM

## 2015-03-20 ENCOUNTER — Encounter (HOSPITAL_COMMUNITY): Payer: Self-pay | Admitting: Cardiovascular Disease

## 2015-03-20 DIAGNOSIS — I5042 Chronic combined systolic (congestive) and diastolic (congestive) heart failure: Secondary | ICD-10-CM

## 2015-03-20 LAB — BASIC METABOLIC PANEL
ANION GAP: 10 (ref 5–15)
BUN: 31 mg/dL — AB (ref 6–20)
CALCIUM: 9.5 mg/dL (ref 8.9–10.3)
CO2: 25 mmol/L (ref 22–32)
CREATININE: 2.01 mg/dL — AB (ref 0.61–1.24)
Chloride: 104 mmol/L (ref 101–111)
GFR calc Af Amer: 34 mL/min — ABNORMAL LOW (ref >60)
GFR, EST NON AFRICAN AMERICAN: 29 mL/min — AB (ref >60)
GLUCOSE: 116 mg/dL — AB (ref 65–99)
Potassium: 3.8 mmol/L (ref 3.5–5.1)
Sodium: 139 mmol/L (ref 135–145)

## 2015-03-20 LAB — PROTIME-INR
INR: 1.33 (ref 0.00–1.49)
Prothrombin Time: 16.6 seconds — ABNORMAL HIGH (ref 11.6–15.2)

## 2015-03-20 LAB — CBC
HEMATOCRIT: 37.2 % — AB (ref 39.0–52.0)
Hemoglobin: 11.9 g/dL — ABNORMAL LOW (ref 13.0–17.0)
MCH: 27.5 pg (ref 26.0–34.0)
MCHC: 32 g/dL (ref 30.0–36.0)
MCV: 86.1 fL (ref 78.0–100.0)
PLATELETS: 137 10*3/uL — AB (ref 150–400)
RBC: 4.32 MIL/uL (ref 4.22–5.81)
RDW: 14.3 % (ref 11.5–15.5)
WBC: 6.7 10*3/uL (ref 4.0–10.5)

## 2015-03-20 MED ORDER — RANOLAZINE ER 1000 MG PO TB12: 1000.0000 mg | ORAL_TABLET | Freq: Two times a day (BID) | ORAL | Status: AC

## 2015-03-20 MED ORDER — WARFARIN - PHARMACIST DOSING INPATIENT: Freq: Every day | Status: DC

## 2015-03-20 MED ORDER — ASPIRIN 81 MG PO TBEC: 81.0000 mg | DELAYED_RELEASE_TABLET | Freq: Every day | ORAL | Status: DC

## 2015-03-20 MED ORDER — RANOLAZINE ER 500 MG PO TB12
1000.0000 mg | ORAL_TABLET | Freq: Two times a day (BID) | ORAL | Status: DC
  Administered 2015-03-20: 1000 mg via ORAL
  Filled 2015-03-20 (×2): qty 2

## 2015-03-20 MED ORDER — ISOSORBIDE MONONITRATE ER 30 MG PO TB24: 90.0000 mg | ORAL_TABLET | Freq: Every day | ORAL | Status: DC

## 2015-03-20 MED ORDER — WARFARIN SODIUM 5 MG PO TABS: 5.0000 mg | ORAL_TABLET | Freq: Once | ORAL | Status: DC

## 2015-03-20 MED FILL — Heparin Sodium (Porcine) 2 Unit/ML in Sodium Chloride 0.9%: INTRAMUSCULAR | Qty: 500 | Status: AC

## 2015-03-20 NOTE — Progress Notes (Addendum)
Patient ID: Sean [name], male   DOB: 1931/07/03, 82 y.o.   MRN: [id number]   SUBJECTIVE: Feels better today.  No chest pain.  Creatinine stable at 2.   Echo: severe LV dilation, EF 30-35% with wall motion abnormalities, moderate MR.   LHC: LVEDP 12, LIMA-LAD and SVG-LCx patent.  Other 3 SVGs occluded.  No significant change from 2013 cath.   Scheduled Meds: . aspirin EC  81 mg Oral Daily  . atorvastatin  20 mg Oral q1800  . carvedilol  25 mg Oral BID  . donepezil  10 mg Oral QHS  . fenofibrate  160 mg Oral Daily  . isosorbide mononitrate  90 mg Oral Daily  . pantoprazole  40 mg Oral Daily  . potassium chloride SA  20 mEq Oral BID  . ranolazine  1,000 mg Oral BID  . sodium chloride flush  3 mL Intravenous Q12H   Continuous Infusions: . sodium chloride 75 mL/hr at 03/19/15 1305   PRN Meds:.sodium chloride, acetaminophen, diazepam, nitroGLYCERIN, ondansetron (ZOFRAN) IV, sodium chloride flush    Filed Vitals:   03/19/15 1315 03/19/15 1500 03/19/15 2154 03/20/15 0437  BP: 116/55  131/49 121/55  Pulse: 60  63 60  Temp:  98 F (36.7 C) 99.9 F (37.7 C) 98.6 F (37 C)  TempSrc:  Oral Oral Oral  Resp: 21     Height:      Weight:    183 lb 8 oz (83.235 kg)  SpO2: 96%  96% 98%    Intake/Output Summary (Last 24 hours) at 03/20/15 0803 Last data filed at 03/20/15 0442  Gross per 24 hour  Intake    480 ml  Output   1200 ml  Net   -720 ml    LABS: Basic Metabolic Panel:  Recent Labs  03/19/15 0455 03/20/15 0530  NA 138 139  K 4.0 3.8  CL 103 104  CO2 25 25  GLUCOSE 114* 116*  BUN 31* 31*  CREATININE 2.03* 2.01*  CALCIUM 9.7 9.5   Liver Function Tests: No results for input(s): AST, ALT, ALKPHOS, BILITOT, PROT, ALBUMIN in the last 72 hours. No results for input(s): LIPASE, AMYLASE in the last 72 hours. CBC:  Recent Labs  03/19/15 0338 03/20/15 0530  WBC 8.7 6.7  HGB 12.5* 11.9*  HCT 39.3 37.2*  MCV 85.8 86.1  PLT 151 137*   Cardiac Enzymes: No  results for input(s): CKTOTAL, CKMB, CKMBINDEX, TROPONINI in the last 72 hours. BNP: Invalid input(s): POCBNP D-Dimer: No results for input(s): DDIMER in the last 72 hours. Hemoglobin A1C: No results for input(s): HGBA1C in the last 72 hours. Fasting Lipid Panel: No results for input(s): CHOL, HDL, LDLCALC, TRIG, CHOLHDL, LDLDIRECT in the last 72 hours. Thyroid Function Tests: No results for input(s): TSH, T4TOTAL, T3FREE, THYROIDAB in the last 72 hours.  Invalid input(s): FREET3 Anemia Panel: No results for input(s): VITAMINB12, FOLATE, FERRITIN, TIBC, IRON, RETICCTPCT in the last 72 hours.  RADIOLOGY: Dg Chest 2 View  03/19/2015  CLINICAL DATA:  82 year old male with right mid lung opacity on prior chest x-ray EXAM: CHEST  2 VIEW COMPARISON:  Prior chest x-ray 03/15/2015 FINDINGS: Right subclavian approach biventricular cardiac rhythm maintenance device with leads projecting over the right atrium, right ventricle and overlying the left ventricle. Patient is status post median sternotomy with evidence of prior multivessel CABG including LIMA bypass. Stable cardiomegaly. Atherosclerotic calcifications again noted in the transverse aorta. Inspiratory volumes are lower with resultant bibasilar opacities favored to reflect atelectasis.  Surgical changes of prior right partial lung resection. Similar degree of patchy opacity in the right perihilar region which does not appear overtly mass length. Chronic blunting of right costophrenic angle. No pulmonary edema or pneumothorax. No acute osseous abnormality. IMPRESSION: 1. Lower inspiratory volumes with developing bibasilar subsegmental atelectasis. 2. Persistent nonspecific patchy airspace opacity in the right perihilar region adjacent to chained sutures. This may represent a region of pleural parenchymal scarring, perihilar atelectasis or less likely infiltrate. There is no significant interval change compared to the recent prior imaging from  03/15/2015. 3. Stable cardiomegaly. Electronically Signed   By: Jacqulynn Cadet M.D.   On: 03/19/2015 21:34   Dg Chest Portable 1 View  03/15/2015  CLINICAL DATA:  Acute onset of bilateral arm pain. Mid chest pain. Chronic shortness of breath. Initial encounter. EXAM: PORTABLE CHEST 1 VIEW COMPARISON:  Chest radiograph performed 02/06/2009 FINDINGS: The lungs are well-aerated. Mild right midlung opacity may reflect atelectasis or possibly mild pneumonia. There is no evidence of pleural effusion or pneumothorax. The cardiomediastinal silhouette is borderline normal in size. The patient is status post median sternotomy. A pacemaker/AICD is noted overlying the right chest wall, with leads ending overlying the right atrium and right ventricle. No acute osseous abnormalities are seen. IMPRESSION: Mild right midlung opacity may reflect atelectasis or possibly mild pneumonia. Followup PA and lateral chest X-ray is recommended in 3-4 weeks following trial of antibiotic therapy to ensure resolution and exclude underlying malignancy. If the patient does not have symptoms of pneumonia, a PA view of the chest could be considered for further evaluation Electronically Signed   By: Garald Balding M.D.   On: 03/15/2015 05:32    PHYSICAL EXAM General: NAD Neck: No JVD, no thyromegaly or thyroid nodule.  Lungs: Clear to auscultation bilaterally with normal respiratory effort. CV: Nondisplaced PMI.  Heart regular S1/S2, no S3/S4, 3/6 HSM apex.  No peripheral edema.  No carotid bruit.  Normal pedal pulses.  Abdomen: Soft, nontender, no hepatosplenomegaly, no distention.  Neurologic: Alert and oriented x 3.  Psych: Normal affect. Extremities: No clubbing or cyanosis.   TELEMETRY: Reviewed telemetry pt in NSR  ASSESSMENT AND PLAN: 82 yo with CAD s/p CABG, ischemic cardiomyopathy, St Jude CRT-D, and dementia presented with increased exertional chest pain concerning for progression of coronary disease (unstable angina).   1. CAD: s/p CABG.  Has history of chronic stable angina x years.  Worsening exertional chest pain recently suggestive of unstable angina.  LHC yesterday showed no significant change compared to 2013.  I think that perhaps his worsening chest pain is related to being off ranolazine.  He used to take ranolazine 1000 mg bid but stopped because of the donut hole.   - Restart ranolazine 1000 mg bid.  Need case management to look into this medication for him.  - Continue Imdur 90 daily, atorvastatin 20 daily.  - Restart warfarin so will not get long-term ASA.  2. CKD: Stage III-IV. Creatinine stable post-cath.  - He is not volume overloaded today => hold Lasix until tomorrow then restart.  - Hold losartan until tomorrow.  3. Chronic systolic CHF: EF [id number] with moderate MR.  He is not volume overloaded on exam. St Jude CRT-D. - Continue Coreg 25 mg bid.  - Restart losartan 50 daily tomorrow.  - Restart Lasix 80 qam/40 qpm tomorrow.  4. Disposition: Home today.  Needs followup with me (overbook if needed) in CHF clinic in 10-14 days. Needs coumadin clinic followup. Home meds: losartan 50  daily (restart tomorrow), Lasix 80 qam/40 qpm (restart tomorrow), ranolazine 1000 mg bid, atorvastatin 20 daily, Imdur 90 daily, KCl 20 bid, warfarin per coumadin clinic.   Patient with dementia, discussed plan at length with wife.   [name] 03/20/2015 8:17 AM

## 2015-03-20 NOTE — Discharge Summary (Signed)
Discharge Summary    Patient ID: Sean [name],  MRN: [id number], DOB/AGE: [birth year] 82 y.o.  Admit date: 03/15/2015 Discharge date: 03/20/2015  Primary Care Provider: Eulas Post Primary Cardiologist: Aundra Dubin  Discharge Diagnoses    Principal Problem:   Unstable angina [facility]) Active Problems:   Hyperlipidemia   Essential hypertension   Atherosclerosis of coronary artery bypass graft with unstable angina pectoris ([place])   Chronic combined systolic and diastolic heart failure (HCC)   Paroxysmal atrial fibrillation ([place])   Long term (current) use of anticoagulants   Ventricular tachycardia (HCC)   Biventricular implantable cardioverter-defibrillator in situ   CKD (chronic kidney disease) stage 3, GFR 30-59 ml/min   Abnormal chest x-ray   Coronary artery disease involving coronary bypass graft of native heart with unstable angina pectoris (HCC)   Allergies Allergies  Allergen Reactions  . Carisoprodol     REACTION: rash  . Lisinopril     REACTION: cough  . Tape     Cloth Tape tears skin  . Zolpidem Tartrate Other (See Comments)    Altered mental status  . Penicillins Rash    Diagnostic Studies/Procedures    Procedures    Left Heart Cath and Coronary Angiography    Conclusion     Mid LAD-1 lesion, 95% stenosed.  Mid LAD-2 lesion, 100% stenosed.  Prox Cx lesion, 100% stenosed.  Prox RCA lesion, 85% stenosed.  Mid RCA lesion, 100% stenosed.  LIMA was injected is normal in caliber, and is anatomically normal.  Patent LIMA graft which supplies the mid LAD  SVG was injected .  There is mild disease in the graft.  Patent vein graft which supplies the distal circumflex vessel. There is mild 30% proximal graft narrowing and a widely patent stent in the mid distal portion of this graft. There is collateralization to the distal RCA from the marginal branch supplied by this graft.  SVG .  Origin lesion, 100% stenosed.  SVG was injected .  There  is severe disease in the graft.  SVG to a marginal 1 vessel was totally occluded proximal to a previously placed very proximal stent.  Prox Graft lesion, 100% stenosed. The lesion was previously treated with a stent (unknown type).  SVG .  There is severe disease in the graft.  Occluded vein graft at its origin. The exact location to where this graft anastomosed into is uncertain.  Origin to Prox Graft lesion, 100% stenosed.  Origin lesion, 100% stenosed.  LM lesion, 80% stenosed.  Severe native coronary obstructive disease with 80% distal left main stenosis, 95% proximal followed by total occlusion of the LAD after the first septal perforating artery; occluded proximal left circumflex: Artery, and total proximal occlusion of the RCA after the initial RV marginal vessel with faint bridging collaterals to the distal RCA from this marginal branch and also collateralization from the distal circumflex marginal supplied by the vein graft.  Patent LIMA to the LAD.  Patent vein graft which supplies the distal marginal vessel which has mild 30% proximal narrowing in the vein graft and a widely patent mid distal stent in the body of the graft.  Occlusion of all 3 remaining saphenous vein grafts.  RECOMMENDATION: The patient's catheterization findings are similar to his last catheterization in 2013. Increased medical therapy will be recommended. Angiograms will be reviewed with Dr. Aundra Dubin, the patient's primary cardiologist. He will be hydrated post procedure.   Diagnostic Diagram         Echocardiogram  Study  Conclusions  - Left ventricle: The cavity size was severely dilated. Wall thickness was increased in a pattern of mild LVH. Systolic function was moderately to severely reduced. The estimated ejection fraction was in the range of 30% to 35%. Diffuse hypokinesis. There is akinesis of the inferolateral and inferior myocardium. - Aortic valve: There was mild  regurgitation. - Mitral valve: There was moderate regurgitation. - Left atrium: The atrium was severely dilated.  Impressions:  - Akinesis of the inferior, inferoseptal and inferior lateral walls with overall moderate to severe LV dysfunction; thickened aortic valve; appears to open well but velocity not performed; mild AI; moderate MR; severe LAE; mild TR. _____________   History of Present Illness     Sean [name] is a 82 y.o. male with a history of CAD s/p CABG (1991 with redo in 1997) ischemic cardiomyopathy (EF 25-30%) s/p ICD--> CRT, CKD, mod MR, HTN, HLD, VT s/p ablation (2013), carotid artery stenosis and PAF on coumadin who presented to [facility] today with chest pain.  He had his last cardiac catheterization in 2013 after recurrent episodes of ventricular tachycardia. This revealed SVG-OM patent and LIMA-LAD patent, other 3 SVGs closed, occlusion of RCA. Normal LVEDP  6. Mild moderate progression of the LMCA lesion----supplying a septal which itself has disease.  He was continued on medical therapy. Per Dr. Lia Foyer, PCI of LMCA could be considered, but did not have symptoms and was felt to be unlikely the source for recurrent shocks.  He had a nuclear stress test in 11/2013 which showed no significant ischemia. EF not evaluated due to study not being gated.   He has been noted to have chronic stable angina with exertion in the past for which he has to take SL NTG 1-2 times a month. He was in his usual state of health until yesterday when he just didn't feel that good and laid down to rest. This morning, he got up to take the dogs out and got chest pain. This was unusual for him as he was walking on flat ground and not really exerting himself. Chest pain was described as someone standing on his chest. At its worst the pain was a 7 out of 10. It was associated with shortness of breath and nausea. No diaphoresis. It was relieved after 2 SL nitroglycerin. He became worried and  called EMS.   He denies lower extremity edema, orthopnea or PND. He often feels dizzy or "swimmy headed" but no syncope. He has had some lower extremity cramping that hurt when he tries to straighten his legs. He has also had a recent cough for which he got cough medicine. It is not a productive cough.  In the ER his ECG IS V paced with no acute ischemic changes. Troponin negative. BNP mildly elevated. CXR with possible PNA.  Hospital Course     Consultants: None  Patient was admitted and ruled out for MI. Coumadin was held for left heart catheterization on Monday. He was continued on aspirin, beta blocker, statin. Lasix and ARB were held. His last 2-D echocardiogram July 2016 revealed an ejection fraction of 25-30%.  Another echocardiogram February 2017 revealed his ejection fraction was 30-35% with mild LVH diffuse hypokinesis with akinesis of the inferior lateral and inferior myocardium. Mild aortic valve regurgitation and moderate MR. Left atrium is severely dilated.  Most recent BiV ICD remote reviewed and reveals normal device function.  Patient's INR decreased 1.5 and he underwent left heart catheterization revealing:  Severe native coronary obstructive disease  with 80% distal left main stenosis, 95% proximal followed by total occlusion of the LAD after the first septal perforating artery; occluded proximal left circumflex: Artery, and total proximal occlusion of the RCA after the initial RV marginal vessel with faint bridging collaterals to the distal RCA from this marginal branch and also collateralization from the distal circumflex marginal supplied by the vein graft.  Patent LIMA to the LAD.  Patent vein graft which supplies the distal marginal vessel which has mild 30% proximal narrowing in the vein graft and a widely patent mid distal stent in the body of the graft.  Occlusion of all 3 remaining saphenous vein grafts.  Ranolazine was restarted at 1000 mg twice daily. He previously  stopped it due to going in the donut hole. Continue Imdur 90 mg daily along with atorvastatin 20 mg daily. Warfarin was restarted. Will not need long-term aspirin.  Serum creatinine was stable post cath. He appeared euvolemic. Will hold Lasix and losartan until the day after discharge.  Coumadin clinic and CHF clinic appointments arranged.  The patient was seen by Dr. Aundra Dubin who felt he was stable for DC home.  _____________  Discharge Vitals Blood pressure 121/55, pulse 60, temperature 98.6 F (37 C), temperature source Oral, resp. rate 20, height 6\' 1"  (1.854 m), weight 183 lb 8 oz (83.235 kg), SpO2 98 %.  Filed Weights   03/18/15 1346 03/19/15 0428 03/20/15 0437  Weight: 185 lb 6.5 oz (84.1 kg) 184 lb 9.6 oz (83.734 kg) 183 lb 8 oz (83.235 kg)    Labs & Radiologic Studies     CBC  Recent Labs  03/19/15 0338 03/20/15 0530  WBC 8.7 6.7  HGB 12.5* 11.9*  HCT 39.3 37.2*  MCV 85.8 86.1  PLT 151 0000000*   Basic Metabolic Panel  Recent Labs  03/19/15 0455 03/20/15 0530  NA 138 139  K 4.0 3.8  CL 103 104  CO2 25 25  GLUCOSE 114* 116*  BUN 31* 31*  CREATININE 2.03* 2.01*  CALCIUM 9.7 9.5    Dg Chest 2 View  03/19/2015  CLINICAL DATA:  82 year old male with right mid lung opacity on prior chest x-ray EXAM: CHEST  2 VIEW COMPARISON:  Prior chest x-ray 03/15/2015 FINDINGS: Right subclavian approach biventricular cardiac rhythm maintenance device with leads projecting over the right atrium, right ventricle and overlying the left ventricle. Patient is status post median sternotomy with evidence of prior multivessel CABG including LIMA bypass. Stable cardiomegaly. Atherosclerotic calcifications again noted in the transverse aorta. Inspiratory volumes are lower with resultant bibasilar opacities favored to reflect atelectasis. Surgical changes of prior right partial lung resection. Similar degree of patchy opacity in the right perihilar region which does not appear overtly mass length.  Chronic blunting of right costophrenic angle. No pulmonary edema or pneumothorax. No acute osseous abnormality. IMPRESSION: 1. Lower inspiratory volumes with developing bibasilar subsegmental atelectasis. 2. Persistent nonspecific patchy airspace opacity in the right perihilar region adjacent to chained sutures. This may represent a region of pleural parenchymal scarring, perihilar atelectasis or less likely infiltrate. There is no significant interval change compared to the recent prior imaging from 03/15/2015. 3. Stable cardiomegaly. Electronically Signed   By: Jacqulynn Cadet M.D.   On: 03/19/2015 21:34   Dg Chest Portable 1 View  03/15/2015  CLINICAL DATA:  Acute onset of bilateral arm pain. Mid chest pain. Chronic shortness of breath. Initial encounter. EXAM: PORTABLE CHEST 1 VIEW COMPARISON:  Chest radiograph performed 02/06/2009 FINDINGS: The lungs are well-aerated. Mild  right midlung opacity may reflect atelectasis or possibly mild pneumonia. There is no evidence of pleural effusion or pneumothorax. The cardiomediastinal silhouette is borderline normal in size. The patient is status post median sternotomy. A pacemaker/AICD is noted overlying the right chest wall, with leads ending overlying the right atrium and right ventricle. No acute osseous abnormalities are seen. IMPRESSION: Mild right midlung opacity may reflect atelectasis or possibly mild pneumonia. Followup PA and lateral chest X-ray is recommended in 3-4 weeks following trial of antibiotic therapy to ensure resolution and exclude underlying malignancy. If the patient does not have symptoms of pneumonia, a PA view of the chest could be considered for further evaluation Electronically Signed   By: Garald Balding M.D.   On: 03/15/2015 05:32    Disposition   Pt is being discharged home today in good condition.  Follow-up Plans & Appointments    Follow-up Information    Follow up with [name], MD.   Specialty:  Cardiology   Why:   Office will call you with your follow up appointment date and time   Contact information:   [address] [place] [address] [phone]       Follow up with [facility] Office On 03/22/2015.   Specialty:  Cardiology   Why:  Coumadin clinic,  11:30 AM   Contact information:   [address] [place] [phone]     Discharge Instructions    Diet - low sodium heart healthy    Complete by:  As directed      Discharge instructions    Complete by:  As directed   1.  Restart lasix and cozaar tomorrow.  2.  Monitor your weight every morning.  If you gain 3 pounds in 24      hours, or 5 pounds in a week, call the office for instructions.  3.     Increase activity slowly    Complete by:  As directed            Discharge Medications   Current Discharge Medication List    START taking these medications   Details  aspirin EC 81 MG EC tablet Take 1 tablet (81 mg total) by mouth daily.    ranolazine (RANEXA) 1000 MG SR tablet Take 1 tablet (1,000 mg total) by mouth 2 (two) times daily. Qty: 60 tablet, Refills: 11      CONTINUE these medications which have CHANGED   Details  isosorbide mononitrate (IMDUR) 30 MG 24 hr tablet Take 3 tablets (90 mg total) by mouth daily. Qty: 90 tablet, Refills: 11      CONTINUE these medications which have NOT CHANGED   Details  atorvastatin (LIPITOR) 20 MG tablet TAKE 1 TABLET BY MOUTH EVERY DAY Qty: 30 tablet, Refills: 11    carvedilol (COREG) 25 MG tablet TAKE 1 TABLET BY MOUTH TWICE DAILY Qty: 180 tablet, Refills: 3    donepezil (ARICEPT) 10 MG tablet Take 1 tablet (10 mg total) by mouth at bedtime. Qty: 90 tablet, Refills: 3    fenofibrate 160 MG tablet Take 1 tablet (160 mg total) by mouth daily. Qty: 60 tablet, Refills: 5    furosemide (LASIX) 40 MG tablet TAKE 2 TABLETS BY MOUTH EVERY MORNING AND 1 TABLET BY MOUTH EVERY AFTERNOON Qty: 270 tablet, Refills: 0     losartan (COZAAR) 50 MG tablet Take 1 tablet (50 mg total) by mouth daily. Pt. Needs f/u appt. Qty:  90 tablet, Refills: 0    nitroGLYCERIN (NITROSTAT) 0.4 MG SL tablet PLACE 1 TABLET UNDER THE TONGUE EVERY 5 MINUTES AS NEEDED FOR CHEST PAIN Qty: 100 tablet, Refills: 0    omeprazole (PRILOSEC) 20 MG capsule TAKE ONE CAPSULE BY MOUTH TWICE DAILY Qty: 60 capsule, Refills: 5    potassium chloride SA (K-DUR,KLOR-CON) 20 MEQ tablet TAKE 1 TABLET BY MOUTH TWICE DAILY Qty: 60 tablet, Refills: 3    warfarin (COUMADIN) 5 MG tablet Take 2.5-5 mg by mouth daily. Take 5 mg on Monday and Friday then take 2.5 mg all the other days    benzonatate (TESSALON) 100 MG capsule Take 1 capsule (100 mg total) by mouth 2 (two) times daily as needed for cough. Qty: 20 capsule, Refills: 0         Aspirin prescribed at discharge?  Yes High Intensity Statin Prescribed? (Lipitor 40-80mg  or Crestor 20-40mg ): No: lipitor 20 Beta Blocker Prescribed? Yes For EF 45% or less, Was ACEI/ARB Prescribed? Yes ADP Receptor Inhibitor Prescribed? (i.e. Plavix etc.-Includes Medically Managed Patients): No For EF <40%, Aldosterone Inhibitor Prescribed? No:  Was EF assessed during THIS hospitalization? Yes Was Cardiac Rehab II ordered? (Included Medically managed Patients): No   Outstanding Labs/Studies     Duration of Discharge Encounter   Greater than 30 minutes including physician time.  Otilio Connors PAC 03/20/2015, 9:55 AM

## 2015-03-20 NOTE — Progress Notes (Signed)
ANTICOAGULATION CONSULT NOTE - Follow Up Consult  Pharmacy Consult for warfarin Indication: atrial fibrillation   Labs:  Recent Labs  03/18/15 0300 03/18/15 1420  03/18/15 1530 03/18/15 2306 03/19/15 0338 03/19/15 0455 03/20/15 0530  HGB  --   --   < > 12.2*  --  12.5*  --  11.9*  HCT  --   --   --  37.2*  --  39.3  --  37.2*  PLT  --   --   --  164  --  151  --  137*  LABPROT 21.1*  --   --   --   --  18.3*  --  16.6*  INR 1.82*  --   --   --   --  1.52*  --  1.33  HEPARINUNFRC  --  0.30  --   --  0.37 0.44  --   --   CREATININE 1.93*  --   --   --   --   --  2.03* 2.01*  < > = values in this interval not displayed.   Assessment: 82yo man admitted 03/15/2015 for unstable angina and is on warfarin PTA. CHADS2VASc at least 4.   PMH CAD s/p CABG, ICM, ICD, CKD, HTN, HLD, VT s/p ablation, PAF, HFrEF (EF20-25%)   Warfarin was held and pt was transitioned to heparin for cardiac cath yesterday. Will now transition Mr. Jakes back to warfarin. Current INR 1.33. Cath showed multivessel severe CAD.  PTA warfarin: 5 mg Mon/Fri, 2.5 mg all other days  Goal of Therapy:  Heparin level 0.3-0.7 units/ml Monitor platelets by anticoagulation protocol: Yes   Plan:  -Warfarin 5 mg po x1 -Daily INR   Hughes Better, PharmD, BCPS Clinical Pharmacist Pager: [phone] 03/20/2015 10:46 AM

## 2015-03-22 ENCOUNTER — Ambulatory Visit (INDEPENDENT_AMBULATORY_CARE_PROVIDER_SITE_OTHER): Payer: Medicare Other | Admitting: *Deleted

## 2015-03-22 DIAGNOSIS — I480 Paroxysmal atrial fibrillation: Secondary | ICD-10-CM

## 2015-03-22 DIAGNOSIS — I4891 Unspecified atrial fibrillation: Secondary | ICD-10-CM

## 2015-03-22 DIAGNOSIS — Z5181 Encounter for therapeutic drug level monitoring: Secondary | ICD-10-CM

## 2015-03-22 DIAGNOSIS — I059 Rheumatic mitral valve disease, unspecified: Secondary | ICD-10-CM | POA: Diagnosis not present

## 2015-03-22 DIAGNOSIS — Z7901 Long term (current) use of anticoagulants: Secondary | ICD-10-CM

## 2015-03-22 LAB — POCT INR: INR: 1.3

## 2015-03-27 ENCOUNTER — Telehealth: Payer: Self-pay

## 2015-03-27 NOTE — Telephone Encounter (Signed)
Patient referred to [facility] clinic by Dr Rayann Heman following hospital discharge.  ICM intro call to patient and explained program.  He agreed to monthly follow up.  He has appointment with Dr Aundra Dubin on 04/10/2015.  First ICM remote transmission scheduled for 04/05/2015.   He reported he has been feeling a little lightheaded when getting up from chair since discharge.  He stated his BP has been normal.  He reported he has some different medications that were started after hospital discharge but was not sure the name of them.  Advised to call if the lightheadedness increases.

## 2015-03-29 ENCOUNTER — Ambulatory Visit (INDEPENDENT_AMBULATORY_CARE_PROVIDER_SITE_OTHER): Payer: Medicare Other | Admitting: *Deleted

## 2015-03-29 DIAGNOSIS — I059 Rheumatic mitral valve disease, unspecified: Secondary | ICD-10-CM | POA: Diagnosis not present

## 2015-03-29 DIAGNOSIS — Z7901 Long term (current) use of anticoagulants: Secondary | ICD-10-CM | POA: Diagnosis not present

## 2015-03-29 DIAGNOSIS — Z5181 Encounter for therapeutic drug level monitoring: Secondary | ICD-10-CM | POA: Diagnosis not present

## 2015-03-29 DIAGNOSIS — I4891 Unspecified atrial fibrillation: Secondary | ICD-10-CM | POA: Diagnosis not present

## 2015-03-29 DIAGNOSIS — I480 Paroxysmal atrial fibrillation: Secondary | ICD-10-CM

## 2015-03-29 LAB — POCT INR: INR: 2.1

## 2015-04-02 ENCOUNTER — Other Ambulatory Visit: Payer: Self-pay | Admitting: Family Medicine

## 2015-04-05 ENCOUNTER — Telehealth: Payer: Self-pay

## 2015-04-05 ENCOUNTER — Ambulatory Visit (INDEPENDENT_AMBULATORY_CARE_PROVIDER_SITE_OTHER): Payer: Medicare Other

## 2015-04-05 DIAGNOSIS — Z95810 Presence of automatic (implantable) cardiac defibrillator: Secondary | ICD-10-CM | POA: Diagnosis not present

## 2015-04-05 DIAGNOSIS — I5022 Chronic systolic (congestive) heart failure: Secondary | ICD-10-CM

## 2015-04-05 NOTE — Progress Notes (Signed)
EPIC Encounter for ICM Monitoring  Patient Name: Sean [name] is a 82 y.o. male Date: 04/05/2015 Primary Care Physican: Eulas Post, MD Primary Cardiologist: Aundra Dubin Electrophysiologist: Caryl Comes Dry Weight: 182 lbs  Bi-V Pacing 99%       In the past month, have you:  1. Gained more than 2 pounds in a day or more than 5 pounds in a week? no  2. Had changes in your medications (with verification of current medications)? no  3. Had more shortness of breath than is usual for you? no  4. Limited your activity because of shortness of breath? no  5. Not been able to sleep because of shortness of breath? no  6. Had increased swelling in your feet or ankles? no  7. Had symptoms of dehydration (dizziness, dry mouth, increased thirst, decreased urine output) no  8. Had changes in sodium restriction? no  9. Been compliant with medication? Yes   ICM trend: 3 month view for 04/05/2015   ICM trend: 1 year view for 04/05/2015   Follow-up plan: ICM clinic phone appointment on 05/08/2015.  1st ICM encounter.  [facility] thoracic impedance trending along reference line.  Patient denied any fluid symptoms and reviewed symptoms to report.  Recommended he follow low salt diet of <2000 mg daily and he confirmed he follows low salt diet.  He has appointment with Dr Aundra Dubin on 04/10/2015.  Patient hospitalized on 03/15/2015 for angina.    Copy of note sent to patient's primary care physician, primary cardiologist, and device following physician.  Rosalene Billings, RN, CCM 04/05/2015 3:35 PM

## 2015-04-05 NOTE — Telephone Encounter (Signed)
Remote ICM transmission received.  Attempted 1st ICM patient call and left message for return call.

## 2015-04-06 NOTE — Telephone Encounter (Signed)
Spoke with patient.

## 2015-04-09 ENCOUNTER — Other Ambulatory Visit: Payer: Self-pay | Admitting: Cardiology

## 2015-04-10 ENCOUNTER — Encounter (HOSPITAL_COMMUNITY): Payer: Self-pay

## 2015-04-10 ENCOUNTER — Ambulatory Visit (HOSPITAL_COMMUNITY)
Admission: RE | Admit: 2015-04-10 | Discharge: 2015-04-10 | Disposition: A | Discharge status: Home / Self Care | Payer: Medicare Other | Source: Ambulatory Visit | Attending: Cardiology | Admitting: Cardiology

## 2015-04-10 VITALS — BP 112/51 | HR 74 | Resp 18 | Wt 182.2 lb

## 2015-04-10 DIAGNOSIS — N183 Chronic kidney disease, stage 3 unspecified: Secondary | ICD-10-CM

## 2015-04-10 DIAGNOSIS — I5022 Chronic systolic (congestive) heart failure: Secondary | ICD-10-CM | POA: Insufficient documentation

## 2015-04-10 DIAGNOSIS — I701 Atherosclerosis of renal artery: Secondary | ICD-10-CM | POA: Diagnosis not present

## 2015-04-10 DIAGNOSIS — I774 Celiac artery compression syndrome: Secondary | ICD-10-CM | POA: Insufficient documentation

## 2015-04-10 DIAGNOSIS — Z951 Presence of aortocoronary bypass graft: Secondary | ICD-10-CM | POA: Diagnosis not present

## 2015-04-10 DIAGNOSIS — I2510 Atherosclerotic heart disease of native coronary artery without angina pectoris: Secondary | ICD-10-CM | POA: Insufficient documentation

## 2015-04-10 DIAGNOSIS — N189 Chronic kidney disease, unspecified: Secondary | ICD-10-CM | POA: Diagnosis not present

## 2015-04-10 DIAGNOSIS — Z95810 Presence of automatic (implantable) cardiac defibrillator: Secondary | ICD-10-CM | POA: Insufficient documentation

## 2015-04-10 DIAGNOSIS — I130 Hypertensive heart and chronic kidney disease with heart failure and stage 1 through stage 4 chronic kidney disease, or unspecified chronic kidney disease: Secondary | ICD-10-CM | POA: Insufficient documentation

## 2015-04-10 DIAGNOSIS — Z87891 Personal history of nicotine dependence: Secondary | ICD-10-CM | POA: Diagnosis not present

## 2015-04-10 DIAGNOSIS — I480 Paroxysmal atrial fibrillation: Secondary | ICD-10-CM | POA: Insufficient documentation

## 2015-04-10 DIAGNOSIS — I059 Rheumatic mitral valve disease, unspecified: Secondary | ICD-10-CM

## 2015-04-10 DIAGNOSIS — I5042 Chronic combined systolic (congestive) and diastolic (congestive) heart failure: Secondary | ICD-10-CM

## 2015-04-10 DIAGNOSIS — Z7901 Long term (current) use of anticoagulants: Secondary | ICD-10-CM | POA: Diagnosis not present

## 2015-04-10 DIAGNOSIS — K219 Gastro-esophageal reflux disease without esophagitis: Secondary | ICD-10-CM | POA: Diagnosis not present

## 2015-04-10 DIAGNOSIS — Z8249 Family history of ischemic heart disease and other diseases of the circulatory system: Secondary | ICD-10-CM | POA: Diagnosis not present

## 2015-04-10 DIAGNOSIS — I255 Ischemic cardiomyopathy: Secondary | ICD-10-CM | POA: Insufficient documentation

## 2015-04-10 DIAGNOSIS — Z86718 Personal history of other venous thrombosis and embolism: Secondary | ICD-10-CM | POA: Diagnosis not present

## 2015-04-10 DIAGNOSIS — E785 Hyperlipidemia, unspecified: Secondary | ICD-10-CM | POA: Diagnosis not present

## 2015-04-10 DIAGNOSIS — I340 Nonrheumatic mitral (valve) insufficiency: Secondary | ICD-10-CM | POA: Diagnosis not present

## 2015-04-10 DIAGNOSIS — I6523 Occlusion and stenosis of bilateral carotid arteries: Secondary | ICD-10-CM | POA: Diagnosis not present

## 2015-04-10 DIAGNOSIS — F0390 Unspecified dementia without behavioral disturbance: Secondary | ICD-10-CM | POA: Diagnosis not present

## 2015-04-10 LAB — LIPID PANEL
CHOL/HDL RATIO: 5.4 ratio
Cholesterol: 150 mg/dL (ref 0–200)
HDL: 28 mg/dL — AB (ref >40)
LDL Cholesterol: 74 mg/dL (ref 0–99)
TRIGLYCERIDES: 241 mg/dL — AB (ref <150)
VLDL: 48 mg/dL — ABNORMAL HIGH (ref 0–40)

## 2015-04-10 LAB — BASIC METABOLIC PANEL
ANION GAP: 13 (ref 5–15)
BUN: 41 mg/dL — ABNORMAL HIGH (ref 6–20)
CALCIUM: 9.7 mg/dL (ref 8.9–10.3)
CO2: 23 mmol/L (ref 22–32)
Chloride: 101 mmol/L (ref 101–111)
Creatinine, Ser: 2.5 mg/dL — ABNORMAL HIGH (ref 0.61–1.24)
GFR, EST AFRICAN AMERICAN: 26 mL/min — AB (ref >60)
GFR, EST NON AFRICAN AMERICAN: 23 mL/min — AB (ref >60)
Glucose, Bld: 110 mg/dL — ABNORMAL HIGH (ref 65–99)
POTASSIUM: 3.6 mmol/L (ref 3.5–5.1)
Sodium: 137 mmol/L (ref 135–145)

## 2015-04-10 MED ORDER — ISOSORBIDE MONONITRATE ER 120 MG PO TB24: 120.0000 mg | ORAL_TABLET | Freq: Every day | ORAL | Status: DC

## 2015-04-10 NOTE — Patient Instructions (Signed)
Stop Aspirin  Increase Isosorbide to 120 mg daily, we have sent you in a new prescription for 120 mg tablets  Labs today  Your physician recommends that you schedule a follow-up appointment in: 3 months

## 2015-04-11 ENCOUNTER — Other Ambulatory Visit (HOSPITAL_COMMUNITY): Payer: Self-pay | Admitting: *Deleted

## 2015-04-11 NOTE — Progress Notes (Signed)
Patient ID: Sean [name], male   DOB: 12-01-1931, 82 y.o.   MRN: [id number] PCP: Dr. Elease Hashimoto  82 yo with history of CAD s/p CABG, ischemic cardiomyopathy, CKD, and paroxysmal atrial fibrillation presents for cardiology followup.  In the past, he has had a stable chest pain pattern walking up hill or walking fast. This resolves with rest.  He generally takes NTG 1-2 times/month.   In 2/17, he ran out of ranolazine and did not refill it.  He began to have more frequent episodes of chest pain with less exertion.  He was admitted to [facility].  LHC was similar to the study from 2013, no good interventional options.  LIMA-LAD and SVG-OM remain patent, other SVGs occluded. Echo in 2/17 showed stable EF 30-35%.    He is now home.  He has restarted on ranolazine.  Chest pain pattern has returned to baseline: he gets mild chest tightness walking up steps and rarely when walking to mailbox.  This happens 1-2 times/week.  No chest pain with mild exertion.  No significant exertional dyspnea.   Labs (7/13): LDL 77, HDL 34 Labs (4/14): K 4.3, creatinine 2.0 Labs (5/14): K 4.2, creatinine 1.78 Labs (6/14): LDL 26, HDL 83 Labs (9/14): K 3.6, creatinine 2.0, BNP 308 Labs (6/15): K 3.9, creatinine 2.2, HCT 38.2, LDL 74, HDL 31 Labs (12/15): K 4, creatinine 2.3 Labs (1/16): LDL 88,  HDL 32, TGs 251 Labs (2/17): HCT 37.2, K 3.8, creatinine 2.01  ECG: a-paced, BiV paced.   PMH; 1. History of DVT 2. HTN: ACEI cough. 3. Hyperlipidemia 4. CAD s/p CABG in 1991 with redo in 1997.  [place] 2013 with SVG-OM patent and LIMA-LAD patent, other 3 SVGs closed.  Chronic stable angina.  - LHC (2/17) with patent LIMA-LAD, patent SVG-OM, total occlusion of 3 other SVGs (similar to 2013 cath).  5. Ischemic cardiomyopathy: Echo in 4/13 with dilated LV, EF 30%, mildly dilated RV with mildly decreased RV function, moderate MR, mild AI.  - St Jude ICD with CRT upgrade in 9/10.  - Echo (2/17) with EF 30-35%, inferolateral/inferior  akinesis, severe LV dilation, moderate MR.  6. Carotid stenosis: 5/14 carotid dopplers with 40-59% bilateral ICA stenosis and occluded right vertebral.  5/15 carotid dopplers with 40-59% bilateral ICA stenosis. 5/16 carotid dopplers with 40-59% BICA stenosis.  7. GERD 8. CKD 9. OA 10. Paroxysmal atrial fibrillation 11. Mitral regurgitation: Moderate on last echo. 12. VT: s/p ablation in 5/13.  13. H/o diverticulitis 14. H/o cholecystectomy 15. H/o appendectomy 16. Renal artery stenosis: Renal artery dopplers (5/14) with 60-99% proximal right renal artery stenosis as well as severe celiac artery stenosis.  17. ABIs (12/14) were normal. 18. Low back pain with sciatica 19. Dementia  SH: Married, lives in [place], 2 kids, quit smoking in 1974.   FH: Mother, sister, and brother with CAD.  ROS: All systems reviewed and negative except as per HPI.   Current Outpatient Prescriptions  Medication Sig Dispense Refill  . atorvastatin (LIPITOR) 20 MG tablet TAKE 1 TABLET BY MOUTH EVERY DAY 30 tablet 1  . benzonatate (TESSALON) 100 MG capsule Take 1 capsule (100 mg total) by mouth 2 (two) times daily as needed for cough. 20 capsule 0  . carvedilol (COREG) 25 MG tablet TAKE 1 TABLET BY MOUTH TWICE DAILY 180 tablet 3  . donepezil (ARICEPT) 10 MG tablet Take 1 tablet (10 mg total) by mouth at bedtime. 90 tablet 3  . fenofibrate 160 MG tablet Take 1 tablet (160 mg  total) by mouth daily. 60 tablet 5  . furosemide (LASIX) 40 MG tablet TAKE 2 TABLETS BY MOUTH EVERY MORNING AND 1 TABLET BY MOUTH EVERY AFTERNOON 270 tablet 0  . isosorbide mononitrate (IMDUR) 120 MG 24 hr tablet Take 1 tablet (120 mg total) by mouth daily. 30 tablet 3  . losartan (COZAAR) 50 MG tablet Take 1 tablet (50 mg total) by mouth daily. Pt. Needs f/u appt. 90 tablet 0  . nitroGLYCERIN (NITROSTAT) 0.4 MG SL tablet PLACE 1 TABLET UNDER THE TONGUE EVERY 5 MINUTES AS NEEDED FOR CHEST PAIN 100 tablet 0  . omeprazole (PRILOSEC) 20 MG  capsule TAKE ONE CAPSULE BY MOUTH TWICE DAILY 60 capsule 0  . potassium chloride SA (K-DUR,KLOR-CON) 20 MEQ tablet TAKE 1 TABLET BY MOUTH TWICE DAILY 60 tablet 3  . ranolazine (RANEXA) 1000 MG SR tablet Take 1 tablet (1,000 mg total) by mouth 2 (two) times daily. 60 tablet 11  . warfarin (COUMADIN) 5 MG tablet Take 2.5-5 mg by mouth daily. Take 5 mg on Monday and Friday then take 2.5 mg all the other days    . [DISCONTINUED] triamcinolone (NASACORT AQ) 55 MCG/ACT nasal inhaler Place 2 sprays into the nose as needed.       No current facility-administered medications for this encounter.   BP 112/51 mmHg  Pulse 74  Resp 18  Wt 182 lb 4 oz (82.668 kg)  SpO2 98% General: NAD Neck: No JVD, no thyromegaly or thyroid nodule.  Lungs: Clear to auscultation bilaterally with normal respiratory effort. CV: Nondisplaced PMI.  Heart regular S1/S2, no S3/S4, 2/6 HSM apex.  No peripheral edema.  R carotid bruit.  1+ PT bilaterally.  Abdomen: Soft, nontender, no hepatosplenomegaly, no distention.   Neurologic: Alert and oriented x 3.  Psych: Normal affect. Extremities: No clubbing or cyanosis.   Assessment/Plan:  1. CAD: s/p CABG.  Chronic stable angina, improved on ranolazine.   - Continue ranolazine, ARB, statin, and Coreg.   - Given occasional residual angina, I will increase Imdur to 120 mg daily. - He does not need aspirin as he has stable CAD and is on warfarin.   2. Chronic systolic CHF:  Ischemic cardiomyopathy.  EF 30-35% on last echo.  He does not appear volume overloaded on exam.  He has a [employer] CRT-D device.  - Continue current Lasix dosing (80 qam, 40 qpm).   - Given CKD, will not titrate losartan at this time or add spironolactone.   - Continue current Coreg (at goal dose).  - Recent BMET was stable.  3. Hyperlipidemia: LDL acceptable on atorvastatin.  Triglycerides high, he is on fenofibrate.   4. Mitral regurgitation: Moderate on last echo.  5. Carotid stenosis: Repeat carotid  dopplers in 5/17.   6. Renal artery stenosis: Unilateral renal artery stenosis.  BP is controlled.  I do not think there is a lot of utility to opening this artery in the absence of uncontrolled HTN or bilateral stenosis.   7. Celiac artery stenosis: Severe celiac stenosis on abdominal dopplers.  He does not describe abdominal angina. Continue to follow.  8. Paroxysmal atrial fibrillation: Patient is on warfarin.  He is in NSR today.  9. CKD: Stable recently, sees nephrology.   [name] 04/11/2015

## 2015-04-16 ENCOUNTER — Ambulatory Visit (INDEPENDENT_AMBULATORY_CARE_PROVIDER_SITE_OTHER): Payer: Medicare Other | Admitting: *Deleted

## 2015-04-16 DIAGNOSIS — I059 Rheumatic mitral valve disease, unspecified: Secondary | ICD-10-CM

## 2015-04-16 DIAGNOSIS — Z5181 Encounter for therapeutic drug level monitoring: Secondary | ICD-10-CM

## 2015-04-16 DIAGNOSIS — Z7901 Long term (current) use of anticoagulants: Secondary | ICD-10-CM

## 2015-04-16 DIAGNOSIS — I4891 Unspecified atrial fibrillation: Secondary | ICD-10-CM

## 2015-04-16 DIAGNOSIS — I480 Paroxysmal atrial fibrillation: Secondary | ICD-10-CM

## 2015-04-16 LAB — POCT INR: INR: 3.2

## 2015-04-23 ENCOUNTER — Encounter: Payer: Self-pay | Admitting: Family Medicine

## 2015-04-23 ENCOUNTER — Ambulatory Visit (INDEPENDENT_AMBULATORY_CARE_PROVIDER_SITE_OTHER): Payer: Medicare Other | Admitting: Family Medicine

## 2015-04-23 VITALS — BP 110/78 | HR 66 | Temp 97.4°F | Ht 69.5 in | Wt 186.6 lb

## 2015-04-23 DIAGNOSIS — Z0000 Encounter for general adult medical examination without abnormal findings: Secondary | ICD-10-CM

## 2015-04-23 DIAGNOSIS — R4189 Other symptoms and signs involving cognitive functions and awareness: Secondary | ICD-10-CM

## 2015-04-23 DIAGNOSIS — Z23 Encounter for immunization: Secondary | ICD-10-CM

## 2015-04-23 DIAGNOSIS — I10 Essential (primary) hypertension: Secondary | ICD-10-CM | POA: Diagnosis not present

## 2015-04-23 DIAGNOSIS — E785 Hyperlipidemia, unspecified: Secondary | ICD-10-CM

## 2015-04-23 DIAGNOSIS — I209 Angina pectoris, unspecified: Secondary | ICD-10-CM

## 2015-04-23 NOTE — Progress Notes (Signed)
Pre visit review using our clinic review tool, if applicable. No additional management support is needed unless otherwise documented below in the visit note. 

## 2015-04-23 NOTE — Progress Notes (Signed)
Subjective:    Patient ID: Sean [name], male    DOB: 06-09-1931, 82 y.o.   MRN: [id number]  HPI  Patient seen for Medicare subsequent annual wellness visit and medical follow-up.  He has multiple chronic problems including history of CAD, chronic kidney disease, atrial fibrillation, obstructive sleep apnea, mitral regurgitation, hyperlipidemia, GERD, hypertension, combined systolic and diastolic heart failure, cognitive impairment, history of AV block with biventricular implantable cardioverter-defibrillator.  no recent syncope or chest pain.   Wife had concerns turn the past year about cognitive impairment. Nose recent Mini-Mental status exam 24 out of 30. Currently on Aricept. She feels things are relatively stable. Still has significant deficits with short-term memory.  Patient denies any side effects from Aricept.   Recent lipids were checked per cardiology in these were reviewed. His creatinine was slightly elevated above baseline at 2.5. Patient states he is hydrating well.   He had one fall during the past year. There are no  significant injuries. No recent syncope.   Immunizations reviewed. Needs Prevnar 13. Other immunizations up-to-date  Past Medical History  Diagnosis Date  . HYPERLIPIDEMIA 04/07/2008  . HYPERTENSION 04/07/2008  . CAD, ARTERY BYPASS GRAFT 05/22/2008  . CARDIOMYOPATHY, ISCHEMIC 05/22/2008  . MITRAL VALVE PROLAPSE, NON-RHEUMATIC 05/22/2008  . AV BLOCK 05/22/2008  . Chronic systolic heart failure ([place]) 10/13/2008  . CAROTID ARTERY STENOSIS 11/15/2009  . GERD 04/07/2008  . RENAL INSUFFICIENCY 10/17/2008  . DEGENERATIVE JOINT DISEASE, LEFT KNEE 04/07/2008  . COLONIC POLYPS, HX OF 04/07/2008  . DIVERTICULITIS, HX OF 04/07/2008  . Atrial fibrillation ([place]) 04/04/2010  . Decreased left ventricular function   . Ventricular tachycardia ([place])     Status post catheter ablation may 2013  . CHF (congestive heart failure) ([facility])     class 2 to 3  . Shortness of breath dyspnea      Past Surgical History  Procedure Laterality Date  . Cardiac surgery      open heart 1991, 1997, pacemaker insertion [place] 2010 CD 3231  . Lung surgery  1987  . Hernia repair  1952    2004  . Cholecystectomy    . Appendectomy    . Thoracotomy      secondary to lung mass  . Insert / replace / remove pacemaker      St Jude unify CD 3231/2010  . Colonoscopy N/A 08/26/2013    Procedure: COLONOSCOPY;  Surgeon: Beryle Beams, MD;  Location: [facility] ENDOSCOPY;  Service: Endoscopy;  Laterality: N/A;  . Left heart catheterization with coronary/graft angiogram N/A 04/30/2011    Procedure: LEFT HEART CATHETERIZATION WITH Beatrix Fetters;  Surgeon: Hillary Bow, MD;  Location: [facility] CATH LAB;  Service: Cardiovascular;  Laterality: N/A;  . V-tach ablation N/A 06/26/2011    Procedure: V-TACH ABLATION;  Surgeon: Thompson Grayer, MD;  Location: [facility] CATH LAB;  Service: Cardiovascular;  Laterality: N/A;  . Implantable cardioverter defibrillator generator change N/A 07/15/2013    Procedure: IMPLANTABLE CARDIOVERTER DEFIBRILLATOR GENERATOR CHANGE;  Surgeon: Deboraha Sprang, MD;  Location: [facility] CATH LAB;  Service: Cardiovascular;  Laterality: N/A;  . Cataract extraction Bilateral 2016  . Cardiac catheterization N/A 03/19/2015    Procedure: Left Heart Cath and Coronary Angiography;  Surgeon: Troy Sine, MD;  Location: [facility] CV LAB;  Service: Cardiovascular;  Laterality: N/A;    reports that he quit smoking about 43 years ago. His smoking use included Cigarettes. He has a 15 pack-year smoking history. He has never used smokeless tobacco. He  reports that he does not drink alcohol or use illicit drugs. family history includes Coronary artery disease in his mother; Heart disease in his brother, mother, and sister. Allergies  Allergen Reactions  . Carisoprodol     REACTION: rash  . Lisinopril     REACTION: cough  . Tape     Cloth Tape tears skin  . Zolpidem Tartrate Other (See Comments)    Altered  mental status  . Penicillins Rash   1.  Risk factors based on Past Medical , Social, and Family history reviewed and as indicated above with no changes 2.  Limitations in physical activities None.  No recent falls. 3.  Depression/mood No active depression or anxiety issues 4.  Hearing No defiits 5.  ADLs independent in all. 6.  Cognitive function (orientation to time and place, language, writing, speech,memory) MMSE today 22/30 c/w 24/30 6 months ago. 7.  Home Safety no issues 8.  Height, weight, and visual acuity.all stable. 9.  Counseling discussed  Fall prevention 10. Recommendation of preventive services. Prevnar 13. Continue yearly flu vaccine 11. Labs based on risk factors none indicated at this time 12. Care Plan as above 13. Other Providers Dr Aundra Dubin- cardiology  Dr Benson Norway- GI 84. Written schedule of screening/prevention services given to patient.    Review of Systems  Constitutional: Negative for fever, appetite change, fatigue and unexpected weight change.  HENT: Negative for trouble swallowing.   Eyes: Negative for visual disturbance.  Respiratory: Negative for cough, chest tightness and shortness of breath.   Cardiovascular: Negative for chest pain, palpitations and leg swelling.  Gastrointestinal: Negative for nausea, vomiting and abdominal pain.  Endocrine: Negative for polydipsia and polyuria.  Genitourinary: Negative for dysuria.  Musculoskeletal: Negative for back pain.  Neurological: Negative for dizziness, syncope, weakness, light-headedness and headaches.  Psychiatric/Behavioral: Negative for confusion and agitation.       Objective:   Physical Exam  Constitutional: He is oriented to person, place, and time. He appears well-developed and well-nourished.  HENT:  Right Ear: External ear normal.  Left Ear: External ear normal.  Mouth/Throat: Oropharynx is clear and moist.  Eyes: Pupils are equal, round, and reactive to light.  Neck: Neck supple. No  thyromegaly present.  Cardiovascular: Normal rate and regular rhythm.   Pulmonary/Chest: Effort normal and breath sounds normal. No respiratory distress. He has no wheezes. He has no rales.  Abdominal: Soft. He exhibits no distension and no mass. There is no tenderness. There is no rebound and no guarding.  Musculoskeletal: He exhibits no edema.  Lymphadenopathy:    He has no cervical adenopathy.  Neurological: He is alert and oriented to person, place, and time. No cranial nerve deficit.          Assessment & Plan:   #1 Medicare annual wellness visit. Continue flu vaccine annually. Prevnar 13 given. Other immunizations up-to-date. Discussed fall prevention   #2 hypertension stable and at goal  # 3 hyperlipidemia. Recent lipids reviewed with patient. The were checked just a couple weeks ago per cardiology   #4 cognitive impairment with probable Alzheimer's dementia.  MMSE 22/30  discussed with patient and wife options of adding Namenda versus continued observation and recheck in 6 months and they prefer the latter.  Wife trying to keep him engaged in cognitive tasks such as puzzles

## 2015-04-23 NOTE — Patient Instructions (Signed)
Health Maintenance  Topic Date Due  . PNA vac Low Risk Adult (2 of 2 - PCV13) 02/04/2008  . INFLUENZA VACCINE  09/04/2015  . TETANUS/TDAP  02/04/2016  . ZOSTAVAX  Completed   Consider over the counter plain Claritan as needed for allergy symptoms

## 2015-05-03 ENCOUNTER — Other Ambulatory Visit: Payer: Self-pay | Admitting: Family Medicine

## 2015-05-07 ENCOUNTER — Ambulatory Visit (INDEPENDENT_AMBULATORY_CARE_PROVIDER_SITE_OTHER): Payer: Medicare Other

## 2015-05-07 DIAGNOSIS — I059 Rheumatic mitral valve disease, unspecified: Secondary | ICD-10-CM | POA: Diagnosis not present

## 2015-05-07 DIAGNOSIS — I4891 Unspecified atrial fibrillation: Secondary | ICD-10-CM

## 2015-05-07 DIAGNOSIS — Z5181 Encounter for therapeutic drug level monitoring: Secondary | ICD-10-CM | POA: Diagnosis not present

## 2015-05-07 DIAGNOSIS — I480 Paroxysmal atrial fibrillation: Secondary | ICD-10-CM

## 2015-05-07 DIAGNOSIS — Z7901 Long term (current) use of anticoagulants: Secondary | ICD-10-CM

## 2015-05-07 LAB — POCT INR: INR: 3.3

## 2015-05-08 ENCOUNTER — Ambulatory Visit (INDEPENDENT_AMBULATORY_CARE_PROVIDER_SITE_OTHER): Payer: Medicare Other | Admitting: *Deleted

## 2015-05-08 DIAGNOSIS — I5042 Chronic combined systolic (congestive) and diastolic (congestive) heart failure: Secondary | ICD-10-CM

## 2015-05-08 DIAGNOSIS — Z95810 Presence of automatic (implantable) cardiac defibrillator: Secondary | ICD-10-CM

## 2015-05-08 NOTE — Progress Notes (Signed)
EPIC Encounter for ICM Monitoring  Patient Name: Sean [name] is a 82 y.o. male Date: 05/08/2015 Primary Care Physican: Eulas Post, MD Primary Cardiologist: Aundra Dubin Electrophysiologist: Caryl Comes Dry Weight: 181 lbs   Bi-V Pacing 99%      In the past month, have you:  1. Gained more than 2 pounds in a day or more than 5 pounds in a week? no  2. Had changes in your medications (with verification of current medications)? no  3. Had more shortness of breath than is usual for you? no  4. Limited your activity because of shortness of breath? no  5. Not been able to sleep because of shortness of breath? no  6. Had increased swelling in your feet or ankles? no  7. Had symptoms of dehydration (dizziness, dry mouth, increased thirst, decreased urine output) no  8. Had changes in sodium restriction? no  9. Been compliant with medication? Yes   ICM trend: 3 month view for 05/08/2015   ICM trend: 1 year view for 05/08/2015   Follow-up plan: ICM clinic phone appointment on 06/08/2015.  Thoracic impedance below reference line from 04/17/2015 to 04/23/2015 and 05/07/2015 suggesting fluid accumulation.  Patient denied fluid symptoms.  Reviewed fluid symptoms to report.   Explained how ICM transmissions are scheduled and will automatically be sent.  Explained if not sent will call and ask to send manual transmission.  Education given to limit sodium intake to < 2000 mg and fluid intake to 64 oz daily.  Encouraged to call for any fluid symptoms.  No changes today.    Rosalene Billings, RN, CCM 05/08/2015 12:14 PM

## 2015-05-08 NOTE — Progress Notes (Signed)
Remote ICD transmission.   

## 2015-05-20 ENCOUNTER — Other Ambulatory Visit: Payer: Self-pay | Admitting: Cardiology

## 2015-05-28 ENCOUNTER — Ambulatory Visit (INDEPENDENT_AMBULATORY_CARE_PROVIDER_SITE_OTHER): Payer: Medicare Other | Admitting: *Deleted

## 2015-05-28 DIAGNOSIS — Z7901 Long term (current) use of anticoagulants: Secondary | ICD-10-CM | POA: Diagnosis not present

## 2015-05-28 DIAGNOSIS — I059 Rheumatic mitral valve disease, unspecified: Secondary | ICD-10-CM

## 2015-05-28 DIAGNOSIS — I4891 Unspecified atrial fibrillation: Secondary | ICD-10-CM

## 2015-05-28 DIAGNOSIS — Z5181 Encounter for therapeutic drug level monitoring: Secondary | ICD-10-CM

## 2015-05-28 DIAGNOSIS — I480 Paroxysmal atrial fibrillation: Secondary | ICD-10-CM

## 2015-05-28 LAB — POCT INR: INR: 2.3

## 2015-06-04 ENCOUNTER — Other Ambulatory Visit (HOSPITAL_COMMUNITY): Payer: Self-pay | Admitting: *Deleted

## 2015-06-04 MED ORDER — LOSARTAN POTASSIUM 50 MG PO TABS: 50.0000 mg | ORAL_TABLET | Freq: Every day | ORAL | Status: DC

## 2015-06-08 ENCOUNTER — Ambulatory Visit (INDEPENDENT_AMBULATORY_CARE_PROVIDER_SITE_OTHER): Payer: Medicare Other

## 2015-06-08 DIAGNOSIS — I5042 Chronic combined systolic (congestive) and diastolic (congestive) heart failure: Secondary | ICD-10-CM | POA: Diagnosis not present

## 2015-06-08 DIAGNOSIS — Z95810 Presence of automatic (implantable) cardiac defibrillator: Secondary | ICD-10-CM | POA: Diagnosis not present

## 2015-06-08 NOTE — Progress Notes (Signed)
EPIC Encounter for ICM Monitoring  Patient Name: Sean [name] is a 82 y.o. male Date: 06/08/2015 Primary Care Physican: Eulas Post, MD Primary Cardiologist: Aundra Dubin Electrophysiologist: Caryl Comes Dry Weight: 181 lb    Bi-V Pacing 99%      In the past month, have you:  1. Gained more than 2 pounds in a day or more than 5 pounds in a week? no  2. Had changes in your medications (with verification of current medications)? no  3. Had more shortness of breath than is usual for you? no  4. Limited your activity because of shortness of breath? no  5. Not been able to sleep because of shortness of breath? no  6. Had increased swelling in your feet, ankles, legs or stomach area? no  7. Had symptoms of dehydration (dizziness, dry mouth, increased thirst, decreased urine output) no  8. Had changes in sodium restriction? no  9. Been compliant with medication? Yes  ICM trend: 3 month view for 06/08/2015  ICM trend: 1 year view for 06/08/2015   Follow-up plan: ICM clinic phone appointment 07/12/2015.    FLUID LEVELS: Corvue thoracic impedance trending along baseline suggesting stable fluid levels.   SYMPTOMS: None.  Denied any symptoms such as weight gain, SOB and/or lower extremity swelling. Encouraged to call for any fluid symptoms.  EDUCATION: Limit sodium intake to < 2000 mg and fluid intake to 64 oz daily.   No changes today.     Rosalene Billings, RN, CCM 06/08/2015 2:56 PM

## 2015-06-12 ENCOUNTER — Other Ambulatory Visit: Payer: Self-pay | Admitting: Cardiology

## 2015-06-25 ENCOUNTER — Ambulatory Visit (INDEPENDENT_AMBULATORY_CARE_PROVIDER_SITE_OTHER): Payer: Medicare Other | Admitting: Pharmacist

## 2015-06-25 DIAGNOSIS — I4891 Unspecified atrial fibrillation: Secondary | ICD-10-CM

## 2015-06-25 DIAGNOSIS — I480 Paroxysmal atrial fibrillation: Secondary | ICD-10-CM

## 2015-06-25 DIAGNOSIS — I059 Rheumatic mitral valve disease, unspecified: Secondary | ICD-10-CM

## 2015-06-25 DIAGNOSIS — Z5181 Encounter for therapeutic drug level monitoring: Secondary | ICD-10-CM

## 2015-06-25 DIAGNOSIS — Z7901 Long term (current) use of anticoagulants: Secondary | ICD-10-CM | POA: Diagnosis not present

## 2015-06-25 LAB — CUP PACEART REMOTE DEVICE CHECK
Battery Remaining Longevity: 47 mo
Battery Remaining Percentage: 70 %
Brady Statistic AS VS Percent: 1 %
Date Time Interrogation Session: 20170404073313
HIGH POWER IMPEDANCE MEASURED VALUE: 44 Ohm
HIGH POWER IMPEDANCE MEASURED VALUE: 45 Ohm
Implantable Lead Implant Date: 20070212
Implantable Lead Implant Date: 20100927
Implantable Lead Location: 753858
Implantable Lead Location: 753860
Implantable Lead Model: 157
Implantable Lead Serial Number: 132614
Lead Channel Impedance Value: 280 Ohm
Lead Channel Impedance Value: 380 Ohm
Lead Channel Pacing Threshold Pulse Width: 0.5 ms
Lead Channel Sensing Intrinsic Amplitude: 12 mV
Lead Channel Setting Pacing Amplitude: 2.5 V
Lead Channel Setting Pacing Pulse Width: 0.5 ms
MDC IDC LEAD IMPLANT DT: 20100927
MDC IDC LEAD LOCATION: 753859
MDC IDC MSMT BATTERY VOLTAGE: 2.95 V
MDC IDC MSMT LEADCHNL LV PACING THRESHOLD AMPLITUDE: 1.75 V
MDC IDC MSMT LEADCHNL LV PACING THRESHOLD PULSEWIDTH: 1 ms
MDC IDC MSMT LEADCHNL RA PACING THRESHOLD AMPLITUDE: 0.75 V
MDC IDC MSMT LEADCHNL RA SENSING INTR AMPL: 2.9 mV
MDC IDC MSMT LEADCHNL RV IMPEDANCE VALUE: 510 Ohm
MDC IDC MSMT LEADCHNL RV PACING THRESHOLD AMPLITUDE: 0.5 V
MDC IDC MSMT LEADCHNL RV PACING THRESHOLD PULSEWIDTH: 0.5 ms
MDC IDC SET LEADCHNL LV PACING PULSEWIDTH: 1 ms
MDC IDC SET LEADCHNL RA PACING AMPLITUDE: 2 V
MDC IDC SET LEADCHNL RV PACING AMPLITUDE: 2.5 V
MDC IDC SET LEADCHNL RV SENSING SENSITIVITY: 2 mV
MDC IDC STAT BRADY AP VP PERCENT: 40 %
MDC IDC STAT BRADY AP VS PERCENT: 1 %
MDC IDC STAT BRADY AS VP PERCENT: 59 %
MDC IDC STAT BRADY RA PERCENT PACED: 39 %
Pulse Gen Serial Number: 7183458

## 2015-06-25 LAB — POCT INR: INR: 2.8

## 2015-06-26 ENCOUNTER — Encounter: Payer: Self-pay | Admitting: Cardiology

## 2015-06-28 ENCOUNTER — Other Ambulatory Visit: Payer: Self-pay | Admitting: Cardiology

## 2015-07-12 ENCOUNTER — Ambulatory Visit (INDEPENDENT_AMBULATORY_CARE_PROVIDER_SITE_OTHER): Payer: Medicare Other

## 2015-07-12 DIAGNOSIS — Z95810 Presence of automatic (implantable) cardiac defibrillator: Secondary | ICD-10-CM

## 2015-07-12 DIAGNOSIS — I5042 Chronic combined systolic (congestive) and diastolic (congestive) heart failure: Secondary | ICD-10-CM

## 2015-07-12 NOTE — Progress Notes (Signed)
EPIC Encounter for ICM Monitoring  Patient Name: Sean [name] is a 82 y.o. male Date: 07/12/2015 Primary Care Physican: Eulas Post, MD Primary Cardiologist: Aundra Dubin Electrophysiologist: Caryl Comes Dry Weight: 181 lbs   Bi-V Pacing 98%      In the past month, have you:  1. Gained more than 2 pounds in a day or more than 5 pounds in a week? no  2. Had changes in your medications (with verification of current medications)? no  3. Had more shortness of breath than is usual for you? no  4. Limited your activity because of shortness of breath? no  5. Not been able to sleep because of shortness of breath? no  6. Had increased swelling in your feet, ankles, legs or stomach area? no  7. Had symptoms of dehydration (dizziness, dry mouth, increased thirst, decreased urine output) no  8. Had changes in sodium restriction? no  9. Been compliant with medication? Yes  ICM trend: 3 month view for 07/12/2015   ICM trend: 1 year view for 07/12/2015   Follow-up plan: ICM clinic phone appointment 09/06/2015.  Office visit with Dr Caryl Comes on 08/06/2015.  FLUID LEVELS:  Corvue thoracic impedance decreased 06/26/2015 to 06/29/2015, 07/07/2015 to 07/10/2015 suggesting fluid accumulation and returned to baseline 07/10/2015.    SYMPTOMS:   He denied any symptoms such as weight gain of 3 pounds overnight or 5 pounds within a week, SOB and/or lower extremity swelling. Encouraged to call for any fluid symptoms.   EDUCATION:   Patient reported eating out in the last few days and has been eating salt on watermelon.  Reminded him to limit sodium intake to < 2000 mg and fluid intake to 64 oz daily.     RECOMMENDATIONS: No changes today.        Rosalene Billings, RN, CCM 07/12/2015 8:54 AM

## 2015-07-13 ENCOUNTER — Other Ambulatory Visit: Payer: Self-pay | Admitting: Cardiology

## 2015-07-13 ENCOUNTER — Other Ambulatory Visit: Payer: Self-pay | Admitting: Internal Medicine

## 2015-07-13 NOTE — Telephone Encounter (Signed)
Please advise 

## 2015-07-23 ENCOUNTER — Ambulatory Visit (INDEPENDENT_AMBULATORY_CARE_PROVIDER_SITE_OTHER): Payer: Medicare Other | Admitting: *Deleted

## 2015-07-23 DIAGNOSIS — I059 Rheumatic mitral valve disease, unspecified: Secondary | ICD-10-CM | POA: Diagnosis not present

## 2015-07-23 DIAGNOSIS — Z5181 Encounter for therapeutic drug level monitoring: Secondary | ICD-10-CM | POA: Diagnosis not present

## 2015-07-23 DIAGNOSIS — I480 Paroxysmal atrial fibrillation: Secondary | ICD-10-CM

## 2015-07-23 DIAGNOSIS — I4891 Unspecified atrial fibrillation: Secondary | ICD-10-CM | POA: Diagnosis not present

## 2015-07-23 DIAGNOSIS — Z7901 Long term (current) use of anticoagulants: Secondary | ICD-10-CM | POA: Diagnosis not present

## 2015-07-23 LAB — POCT INR: INR: 2.6

## 2015-07-26 ENCOUNTER — Other Ambulatory Visit: Payer: Self-pay | Admitting: Cardiology

## 2015-07-26 NOTE — Telephone Encounter (Signed)
Please advise 

## 2015-08-06 ENCOUNTER — Encounter: Payer: Self-pay | Admitting: Internal Medicine

## 2015-08-06 ENCOUNTER — Ambulatory Visit (INDEPENDENT_AMBULATORY_CARE_PROVIDER_SITE_OTHER): Payer: Medicare Other | Admitting: Internal Medicine

## 2015-08-06 VITALS — BP 118/50 | HR 67 | Ht 73.0 in | Wt 188.6 lb

## 2015-08-06 DIAGNOSIS — I5022 Chronic systolic (congestive) heart failure: Secondary | ICD-10-CM | POA: Diagnosis not present

## 2015-08-06 DIAGNOSIS — Z95810 Presence of automatic (implantable) cardiac defibrillator: Secondary | ICD-10-CM

## 2015-08-06 DIAGNOSIS — I442 Atrioventricular block, complete: Secondary | ICD-10-CM | POA: Diagnosis not present

## 2015-08-06 DIAGNOSIS — I255 Ischemic cardiomyopathy: Secondary | ICD-10-CM | POA: Diagnosis not present

## 2015-08-06 LAB — BASIC METABOLIC PANEL
BUN: 29 mg/dL — AB (ref 7–25)
CHLORIDE: 100 mmol/L (ref 98–110)
CO2: 29 mmol/L (ref 20–31)
CREATININE: 2.26 mg/dL — AB (ref 0.70–1.11)
Calcium: 9.4 mg/dL (ref 8.6–10.3)
Glucose, Bld: 99 mg/dL (ref 65–99)
Potassium: 4 mmol/L (ref 3.5–5.3)
Sodium: 137 mmol/L (ref 135–146)

## 2015-08-06 LAB — CUP PACEART INCLINIC DEVICE CHECK
Battery Remaining Longevity: 44.4
Brady Statistic RA Percent Paced: 34 %
Date Time Interrogation Session: 20170703120246
HighPow Impedance: 45.3644
Implantable Lead Implant Date: 20070212
Implantable Lead Implant Date: 20100927
Implantable Lead Location: 753859
Implantable Lead Serial Number: 132614
Lead Channel Impedance Value: 475 Ohm
Lead Channel Pacing Threshold Pulse Width: 0.5 ms
Lead Channel Sensing Intrinsic Amplitude: 12 mV
Lead Channel Sensing Intrinsic Amplitude: 3.2 mV
Lead Channel Setting Pacing Amplitude: 2.5 V
Lead Channel Setting Pacing Pulse Width: 1 ms
Lead Channel Setting Sensing Sensitivity: 2 mV
MDC IDC LEAD IMPLANT DT: 20100927
MDC IDC LEAD LOCATION: 753858
MDC IDC LEAD LOCATION: 753860
MDC IDC LEAD MODEL: 157
MDC IDC MSMT LEADCHNL LV IMPEDANCE VALUE: 275 Ohm
MDC IDC MSMT LEADCHNL LV PACING THRESHOLD AMPLITUDE: 1.5 V
MDC IDC MSMT LEADCHNL LV PACING THRESHOLD AMPLITUDE: 1.5 V
MDC IDC MSMT LEADCHNL LV PACING THRESHOLD PULSEWIDTH: 1 ms
MDC IDC MSMT LEADCHNL LV PACING THRESHOLD PULSEWIDTH: 1 ms
MDC IDC MSMT LEADCHNL RA IMPEDANCE VALUE: 400 Ohm
MDC IDC MSMT LEADCHNL RA PACING THRESHOLD AMPLITUDE: 1 V
MDC IDC MSMT LEADCHNL RA PACING THRESHOLD PULSEWIDTH: 0.5 ms
MDC IDC MSMT LEADCHNL RV PACING THRESHOLD AMPLITUDE: 0.5 V
MDC IDC PG SERIAL: 7183458
MDC IDC SET LEADCHNL RA PACING AMPLITUDE: 2 V
MDC IDC SET LEADCHNL RV PACING AMPLITUDE: 2.5 V
MDC IDC SET LEADCHNL RV PACING PULSEWIDTH: 0.5 ms
MDC IDC STAT BRADY RV PERCENT PACED: 98 %

## 2015-08-06 NOTE — Patient Instructions (Signed)
Medication Instructions: - Your physician recommends that you continue on your current medications as directed. Please refer to the Current Medication list given to you today.  Labwork: - Your physician recommends that you have lab work today: [employer]  Procedures/Testing: - none  Follow-Up: - Your physician recommends that you schedule a follow-up appointment in: 2 months with Dr. Aundra Dubin in the [place] clinic  - Remote monitoring is used to monitor your Pacemaker of ICD from home. This monitoring reduces the number of office visits required to check your device to one time per year. It allows [place] to keep an eye on the functioning of your device to ensure it is working properly. You are scheduled for a device check from home on 11/05/15. You may send your transmission at any time that day. If you have a wireless device, the transmission will be sent automatically. After your physician reviews your transmission, you will receive a postcard with your next transmission date.  - Your physician wants you to follow-up in: 6 months with Tommye Standard, PA for Dr. Caryl Comes. You will receive a reminder letter in the mail two months in advance. If you don't receive a letter, please call our office to schedule the follow-up appointment.   Any Additional Special Instructions Will Be Listed Below (If Applicable).     If you need a refill on your cardiac medications before your next appointment, please call your pharmacy.

## 2015-08-06 NOTE — Progress Notes (Signed)
Patient Care Team: Eulas Post, MD as PCP - General   HPI  Sean [name] is a 82 y.o. male followup for ventricular tachycardia in the setting of ischemic heart disease with prior CABG and a prior ICD implantation.  He  developed ventricular tachycardia for which he was ultimately submitted to catheter ablation by Dr. Lovena Le. May 2013. Targeted areas were on  left ventricular septum. He was rendered noninducible.   He underwent CRT upgrade in September 2010  He also has paroxysmal atrial fibrillation and takes warfarin  Cath Final Conclusions 2013 :  1. Continued patency of the LIMA to the LAD diagonal  2. Continued patency of the stent OM2 graft  3. Known occlusion of the three remaining SVG  4. Known occlusion of the RCA   In 2/17, he ran out of ranolazine and did not refill it. He began to have more frequent episodes of chest pain with less exertion. He was admitted to [facility]. LHC was similar to the study from 2013, no good interventional options. LIMA-LAD and SVG-OM remain patent, other SVGs occluded.   HE denies significant chest pain or shortness of breath; h as stable edema  Echo in 2/17 showed stable EF 30-35%.   3/17 K  3.6  Creatinine 2.5    Past Medical History  Diagnosis Date  . HYPERLIPIDEMIA 04/07/2008  . HYPERTENSION 04/07/2008  . CAD, ARTERY BYPASS GRAFT 05/22/2008  . CARDIOMYOPATHY, ISCHEMIC 05/22/2008  . MITRAL VALVE PROLAPSE, NON-RHEUMATIC 05/22/2008  . AV BLOCK 05/22/2008  . Chronic systolic heart failure ([place]) 10/13/2008  . CAROTID ARTERY STENOSIS 11/15/2009  . GERD 04/07/2008  . RENAL INSUFFICIENCY 10/17/2008  . DEGENERATIVE JOINT DISEASE, LEFT KNEE 04/07/2008  . COLONIC POLYPS, HX OF 04/07/2008  . DIVERTICULITIS, HX OF 04/07/2008  . Atrial fibrillation ([place]) 04/04/2010  . Decreased left ventricular function   . Ventricular tachycardia ([place])     Status post catheter ablation may 2013  . CHF (congestive heart failure) (HCC)     class 2 to 3  . Shortness of  breath dyspnea     Past Surgical History  Procedure Laterality Date  . Cardiac surgery      open heart 1991, 1997, pacemaker insertion [place] 2010 CD 3231  . Lung surgery  1987  . Hernia repair  1952    2004  . Cholecystectomy    . Appendectomy    . Thoracotomy      secondary to lung mass  . Insert / replace / remove pacemaker      St Jude unify CD 3231/2010  . Colonoscopy N/A 08/26/2013    Procedure: COLONOSCOPY;  Surgeon: Beryle Beams, MD;  Location: [facility] ENDOSCOPY;  Service: Endoscopy;  Laterality: N/A;  . Left heart catheterization with coronary/graft angiogram N/A 04/30/2011    Procedure: LEFT HEART CATHETERIZATION WITH Beatrix Fetters;  Surgeon: Hillary Bow, MD;  Location: [facility] CATH LAB;  Service: Cardiovascular;  Laterality: N/A;  . V-tach ablation N/A 06/26/2011    Procedure: V-TACH ABLATION;  Surgeon: Thompson Grayer, MD;  Location: [facility] CATH LAB;  Service: Cardiovascular;  Laterality: N/A;  . Implantable cardioverter defibrillator generator change N/A 07/15/2013    Procedure: IMPLANTABLE CARDIOVERTER DEFIBRILLATOR GENERATOR CHANGE;  Surgeon: Deboraha Sprang, MD;  Location: [facility] CATH LAB;  Service: Cardiovascular;  Laterality: N/A;  . Cataract extraction Bilateral 2016  . Cardiac catheterization N/A 03/19/2015    Procedure: Left Heart Cath and Coronary Angiography;  Surgeon: Troy Sine, MD;  Location: [facility]  LAB;  Service: Cardiovascular;  Laterality: N/A;    Current Outpatient Prescriptions  Medication Sig Dispense Refill  . atorvastatin (LIPITOR) 20 MG tablet TAKE 1 TABLET BY MOUTH EVERY DAY 30 tablet 6  . carvedilol (COREG) 25 MG tablet TAKE 1 TABLET BY MOUTH TWICE DAILY 180 tablet 3  . donepezil (ARICEPT) 10 MG tablet Take 1 tablet (10 mg total) by mouth at bedtime. 90 tablet 3  . fenofibrate 160 MG tablet Take 1 tablet (160 mg total) by mouth daily. 60 tablet 5  . furosemide (LASIX) 40 MG tablet TAKE 2 TABLETS BY MOUTH EVERY MORNING AND 1 TABLET BY MOUTH  EVERY AFTERNOON 270 tablet 0  . isosorbide mononitrate (IMDUR) 120 MG 24 hr tablet Take 1 tablet (120 mg total) by mouth daily. 30 tablet 3  . losartan (COZAAR) 50 MG tablet Take 1 tablet (50 mg total) by mouth daily. Pt. Needs f/u appt. 90 tablet 0  . nitroGLYCERIN (NITROSTAT) 0.4 MG SL tablet PLACE 1 TABLET UNDER THE TONGUE EVERY 5 MINUTES AS NEEDED FOR CHEST PAIN 100 tablet 0  . omeprazole (PRILOSEC) 20 MG capsule TAKE ONE CAPSULE BY MOUTH TWICE DAILY 60 capsule 5  . potassium chloride SA (K-DUR,KLOR-CON) 20 MEQ tablet TAKE 1 TABLET BY MOUTH TWICE DAILY 180 tablet 0  . ranolazine (RANEXA) 1000 MG SR tablet Take 1 tablet (1,000 mg total) by mouth 2 (two) times daily. 60 tablet 11  . warfarin (COUMADIN) 5 MG tablet TAKE AS DIRECTED BY COUMADIN CLINIC 90 tablet 0  . [DISCONTINUED] triamcinolone (NASACORT AQ) 55 MCG/ACT nasal inhaler Place 2 sprays into the nose as needed.       No current facility-administered medications for this visit.    Allergies  Allergen Reactions  . Carisoprodol     REACTION: rash  . Lisinopril     REACTION: cough  . Tape     Cloth Tape tears skin  . Zolpidem Tartrate Other (See Comments)    Altered mental status  . Penicillins Rash    Review of Systems negative except from HPI and PMH  Physical Exam BP 118/50 mmHg  Pulse 67  Ht 6\' 1"  (1.854 m)  Wt 188 lb 9.6 oz (85.548 kg)  BMI 24.89 kg/m2  SpO2 95% Well developed and well nourished in no acute distress HENT normal E scleral and icterus clear Neck Supple JVP flat; carotids brisk and full Clear to ausculation  Regular rate and rhythm, 2/6  Murmur apex  Soft   No clubbing cyanosis none Edema Alert and oriented, grossly normal motor and sensory function Skin Warm and Dry  ECG demonstrates P. Synchronous pacing with a negative QRS in lead V1  Assessment and  Plan  Ischemic cardiomyopathy  Complete heart block  Exertional chest discomfort  Atrial fibrillation-paroxysmal   Renal  insufficiency grade 3  Congestive heart failure-chronic-systolic  CRT-D St Jude The patient's device was interrogated.  The information was reviewed. No changes were made in the programming.     He remains on ranolazine. He has stable symptoms. He has chronotropic incompetence with 98% of his heart beats less than 80 bpm; however, I am a little bit reluctant to increase/activate rate response as I suspect we would invoke angina. As his symptoms are stable we will leave things as they are.  We will recheck his renal function. It may well be that he would benefit from an adjunctive hydralazine and the discontinuation of his losartan.   He is tolerating his warfarin without evidence of bleeding

## 2015-08-10 ENCOUNTER — Other Ambulatory Visit: Payer: Self-pay | Admitting: Cardiology

## 2015-08-14 ENCOUNTER — Telehealth (HOSPITAL_COMMUNITY): Payer: Self-pay

## 2015-08-14 NOTE — Telephone Encounter (Signed)
Patient calling CHF clinic triage li ne to report being in "donut hole" and unable to afford Ranexa. Will reach out to CHF clinical pharmacist Doroteo Bradford to see what can be done to assist patient with this.  Renee Pain

## 2015-08-16 ENCOUNTER — Other Ambulatory Visit: Payer: Self-pay | Admitting: Cardiology

## 2015-09-03 ENCOUNTER — Other Ambulatory Visit (HOSPITAL_COMMUNITY): Payer: Self-pay | Admitting: *Deleted

## 2015-09-03 ENCOUNTER — Ambulatory Visit (INDEPENDENT_AMBULATORY_CARE_PROVIDER_SITE_OTHER): Payer: Medicare Other | Admitting: *Deleted

## 2015-09-03 ENCOUNTER — Encounter (INDEPENDENT_AMBULATORY_CARE_PROVIDER_SITE_OTHER): Payer: Self-pay

## 2015-09-03 DIAGNOSIS — I059 Rheumatic mitral valve disease, unspecified: Secondary | ICD-10-CM

## 2015-09-03 DIAGNOSIS — I4891 Unspecified atrial fibrillation: Secondary | ICD-10-CM | POA: Diagnosis not present

## 2015-09-03 DIAGNOSIS — I480 Paroxysmal atrial fibrillation: Secondary | ICD-10-CM

## 2015-09-03 DIAGNOSIS — Z5181 Encounter for therapeutic drug level monitoring: Secondary | ICD-10-CM

## 2015-09-03 DIAGNOSIS — Z7901 Long term (current) use of anticoagulants: Secondary | ICD-10-CM

## 2015-09-03 LAB — POCT INR: INR: 3.2

## 2015-09-03 MED ORDER — LOSARTAN POTASSIUM 50 MG PO TABS: 50.0000 mg | ORAL_TABLET | Freq: Every day | ORAL | 3 refills | Status: DC

## 2015-09-06 ENCOUNTER — Ambulatory Visit (INDEPENDENT_AMBULATORY_CARE_PROVIDER_SITE_OTHER): Payer: Medicare Other

## 2015-09-06 DIAGNOSIS — Z95810 Presence of automatic (implantable) cardiac defibrillator: Secondary | ICD-10-CM

## 2015-09-06 DIAGNOSIS — I5022 Chronic systolic (congestive) heart failure: Secondary | ICD-10-CM | POA: Diagnosis not present

## 2015-09-07 ENCOUNTER — Other Ambulatory Visit: Payer: Self-pay | Admitting: Cardiology

## 2015-09-07 NOTE — Progress Notes (Signed)
EPIC Encounter for ICM Monitoring  Patient Name: Sean [name] is a 82 y.o. male Date: 09/07/2015 Primary Care Physican: Eulas Post, MD Primary Cardiologist: Aundra Dubin Electrophysiologist: Caryl Comes Dry Weight: 193 lb  Bi-V Pacing:  98%       Heart Failure questions reviewed, pt asymptomatic   Thoracic impedance normal.  LABS: 08/06/2015 Creatinine 2.26, BUN 29, Potassium 4.0, Sodium 137 04/10/2015 Creatinine 2.50, BUN 41, Potassium 3.6, Sodium 137 03/20/2015 Creatinine 2.01, BUN 31, Potassium 3.8, Sodium 139 03/19/2015 Creatinine 2.03, BUN 31, Potassium 4.0, Sodium 138 03/18/2015 Creatinine 1.93, BUN 31, Potassium 4.1, Sodium 140       03/17/2015 Creatinine 1.96, BUN 33, Potassium 4.3, Sodium 141  Recommendations: No changes.   Low sodium diet education provided   ICM trend: 09/06/2015     Follow-up plan: ICM clinic phone appointment on 10/23/2015. HF clinic appointment on 10/09/2015.  Copy of ICM check sent to device physician.   Rosalene Billings, RN 09/07/2015 10:21 AM

## 2015-09-07 NOTE — Telephone Encounter (Signed)
Medication Detail    Disp Refills Start End   potassium chloride SA (K-DUR,KLOR-CON) 20 MEQ tablet 180 tablet 0 07/17/2015    Sig: TAKE 1 TABLET BY MOUTH TWICE DAILY   Notes to Pharmacy: **Patient requests 90 days supply**   E-Prescribing Status: Receipt confirmed by pharmacy (07/17/2015 2:51 PM EDT)

## 2015-09-10 ENCOUNTER — Other Ambulatory Visit: Payer: Self-pay | Admitting: Cardiology

## 2015-09-24 ENCOUNTER — Ambulatory Visit (INDEPENDENT_AMBULATORY_CARE_PROVIDER_SITE_OTHER): Payer: Medicare Other

## 2015-09-24 DIAGNOSIS — Z7901 Long term (current) use of anticoagulants: Secondary | ICD-10-CM

## 2015-09-24 DIAGNOSIS — I059 Rheumatic mitral valve disease, unspecified: Secondary | ICD-10-CM

## 2015-09-24 DIAGNOSIS — Z5181 Encounter for therapeutic drug level monitoring: Secondary | ICD-10-CM

## 2015-09-24 DIAGNOSIS — I4891 Unspecified atrial fibrillation: Secondary | ICD-10-CM | POA: Diagnosis not present

## 2015-09-24 DIAGNOSIS — I480 Paroxysmal atrial fibrillation: Secondary | ICD-10-CM

## 2015-09-24 LAB — POCT INR: INR: 3.3

## 2015-09-27 ENCOUNTER — Telehealth (HOSPITAL_COMMUNITY): Payer: Self-pay | Admitting: Pharmacist

## 2015-09-27 NOTE — Telephone Encounter (Signed)
Ranexa patient assistance approved through 02/03/16 so that Mr. Brodzik will receive 1000 mg BID at no cost to him.   Ruta Hinds. Velva Harman, PharmD, BCPS, CPP Clinical Pharmacist Pager: [phone] Phone: [phone] 09/27/2015 3:40 PM

## 2015-10-04 ENCOUNTER — Other Ambulatory Visit (HOSPITAL_COMMUNITY): Payer: Self-pay | Admitting: *Deleted

## 2015-10-04 MED ORDER — ISOSORBIDE MONONITRATE ER 120 MG PO TB24: 120.0000 mg | ORAL_TABLET | Freq: Every day | ORAL | 3 refills | Status: AC

## 2015-10-09 ENCOUNTER — Ambulatory Visit (HOSPITAL_COMMUNITY)
Admission: RE | Admit: 2015-10-09 | Discharge: 2015-10-09 | Disposition: A | Discharge status: Home / Self Care | Payer: Medicare Other | Source: Ambulatory Visit | Attending: Cardiology | Admitting: Cardiology

## 2015-10-09 ENCOUNTER — Telehealth (HOSPITAL_COMMUNITY): Payer: Self-pay

## 2015-10-09 ENCOUNTER — Telehealth: Payer: Self-pay | Admitting: *Deleted

## 2015-10-09 ENCOUNTER — Encounter (HOSPITAL_COMMUNITY): Payer: Self-pay

## 2015-10-09 ENCOUNTER — Telehealth (HOSPITAL_COMMUNITY): Payer: Self-pay | Admitting: Vascular Surgery

## 2015-10-09 VITALS — BP 110/50 | HR 65 | Wt 178.5 lb

## 2015-10-09 DIAGNOSIS — I2510 Atherosclerotic heart disease of native coronary artery without angina pectoris: Secondary | ICD-10-CM | POA: Insufficient documentation

## 2015-10-09 DIAGNOSIS — Z95810 Presence of automatic (implantable) cardiac defibrillator: Secondary | ICD-10-CM | POA: Diagnosis not present

## 2015-10-09 DIAGNOSIS — I255 Ischemic cardiomyopathy: Secondary | ICD-10-CM | POA: Diagnosis not present

## 2015-10-09 DIAGNOSIS — Z86718 Personal history of other venous thrombosis and embolism: Secondary | ICD-10-CM | POA: Insufficient documentation

## 2015-10-09 DIAGNOSIS — Z79899 Other long term (current) drug therapy: Secondary | ICD-10-CM | POA: Diagnosis not present

## 2015-10-09 DIAGNOSIS — I480 Paroxysmal atrial fibrillation: Secondary | ICD-10-CM | POA: Insufficient documentation

## 2015-10-09 DIAGNOSIS — I6523 Occlusion and stenosis of bilateral carotid arteries: Secondary | ICD-10-CM | POA: Diagnosis not present

## 2015-10-09 DIAGNOSIS — Z8249 Family history of ischemic heart disease and other diseases of the circulatory system: Secondary | ICD-10-CM | POA: Insufficient documentation

## 2015-10-09 DIAGNOSIS — I5022 Chronic systolic (congestive) heart failure: Secondary | ICD-10-CM | POA: Diagnosis not present

## 2015-10-09 DIAGNOSIS — K219 Gastro-esophageal reflux disease without esophagitis: Secondary | ICD-10-CM | POA: Insufficient documentation

## 2015-10-09 DIAGNOSIS — Z951 Presence of aortocoronary bypass graft: Secondary | ICD-10-CM | POA: Insufficient documentation

## 2015-10-09 DIAGNOSIS — I442 Atrioventricular block, complete: Secondary | ICD-10-CM

## 2015-10-09 DIAGNOSIS — I340 Nonrheumatic mitral (valve) insufficiency: Secondary | ICD-10-CM | POA: Insufficient documentation

## 2015-10-09 DIAGNOSIS — E785 Hyperlipidemia, unspecified: Secondary | ICD-10-CM | POA: Diagnosis not present

## 2015-10-09 DIAGNOSIS — Z7901 Long term (current) use of anticoagulants: Secondary | ICD-10-CM | POA: Insufficient documentation

## 2015-10-09 DIAGNOSIS — F0390 Unspecified dementia without behavioral disturbance: Secondary | ICD-10-CM | POA: Insufficient documentation

## 2015-10-09 DIAGNOSIS — Z87891 Personal history of nicotine dependence: Secondary | ICD-10-CM | POA: Diagnosis not present

## 2015-10-09 DIAGNOSIS — I701 Atherosclerosis of renal artery: Secondary | ICD-10-CM | POA: Insufficient documentation

## 2015-10-09 DIAGNOSIS — N189 Chronic kidney disease, unspecified: Secondary | ICD-10-CM | POA: Diagnosis not present

## 2015-10-09 DIAGNOSIS — I130 Hypertensive heart and chronic kidney disease with heart failure and stage 1 through stage 4 chronic kidney disease, or unspecified chronic kidney disease: Secondary | ICD-10-CM | POA: Diagnosis not present

## 2015-10-09 DIAGNOSIS — I5042 Chronic combined systolic (congestive) and diastolic (congestive) heart failure: Secondary | ICD-10-CM

## 2015-10-09 LAB — BASIC METABOLIC PANEL
ANION GAP: 8 (ref 5–15)
BUN: 29 mg/dL — ABNORMAL HIGH (ref 6–20)
CHLORIDE: 102 mmol/L (ref 101–111)
CO2: 28 mmol/L (ref 22–32)
Calcium: 10 mg/dL (ref 8.9–10.3)
Creatinine, Ser: 2.27 mg/dL — ABNORMAL HIGH (ref 0.61–1.24)
GFR calc Af Amer: 29 mL/min — ABNORMAL LOW (ref >60)
GFR, EST NON AFRICAN AMERICAN: 25 mL/min — AB (ref >60)
GLUCOSE: 106 mg/dL — AB (ref 65–99)
POTASSIUM: 4 mmol/L (ref 3.5–5.1)
Sodium: 138 mmol/L (ref 135–145)

## 2015-10-09 MED ORDER — AMIODARONE HCL 200 MG PO TABS: ORAL_TABLET | ORAL | 3 refills | Status: DC

## 2015-10-09 NOTE — Patient Instructions (Signed)
Take Amiodarone 200mg  twice daily for 10days. Then decrease Amiodarone to 200mg  daily.  Your provider requested you have Carotid Dopplers.  Routine lab work today. Will notify you of abnormal results  Follow up with Dr.McLean in 3 weeks.

## 2015-10-09 NOTE — Telephone Encounter (Signed)
VT episode with ATP and shock 10/08/15. Appt with CHF clinic today, amiodarone started.  Ms. Greason reports that her husband did not notify her of the shock until he was telling Dr. Aundra Dubin today. She reports that he was in the basement working on a lawnmower and woke up beside the lawnmower with his glasses on the ground. He felt fine after he awoke, he does not drive.  His wife questioned the amiodarone's affect on warfarin dosing- concern was expressed by the pharmacist while picking up Rx. I made her aware that following closely with the coumadin clinic will be important for dosing warfarin. She verbalizes understanding.

## 2015-10-09 NOTE — Telephone Encounter (Signed)
Sent Feleacha Clarke message to call pt to schedule carotid doppler

## 2015-10-09 NOTE — Telephone Encounter (Signed)
Patient's wife called CHF clinic triage to follow up on new medication ordered this morning by Dr. Aundra Dubin in clinic. Patient and wife were told Amiodarone change would have effect on Coumadin. Will forward to CHF clinical pharmacist Doroteo Bradford to follow up with pharmacy/patient.  Renee Pain, RN

## 2015-10-09 NOTE — Addendum Note (Signed)
Encounter addended by: Larey Dresser, MD on: 10/09/2015 10:41 PM<BR>    Actions taken: LOS modified, Visit diagnoses modified

## 2015-10-09 NOTE — Progress Notes (Signed)
Patient ID: Sean [name], male   DOB: 11/15/1931, 82 y.o.   MRN: [id number] PCP: Dr. Elease Hashimoto  82 yo with history of CAD s/p CABG, ischemic cardiomyopathy, CKD, and paroxysmal atrial fibrillation presents for cardiology followup.  In the past, he has had a stable chest pain pattern walking up hill or walking fast. This resolves with rest.  He generally takes NTG 1-2 times/month.   In 2/17, he ran out of ranolazine and did not refill it.  He began to have more frequent episodes of chest pain with less exertion.  He was admitted to [facility].  LHC was similar to the study from 2013, no good interventional options.  LIMA-LAD and SVG-OM remain patent, other SVGs occluded. Echo in 2/17 showed stable EF 30-35%.    He has a stable chest pain pattern, he gets mild chest tightness walking up steps and rarely when walking to mailbox.  This happens 1-2 times/week.  No chest pain with mild exertion.  This has been well-controlled on ranolazine.  He is short of breath after walking about 100 yards, this is also stable.     He has had several recent episodes where he will get diaphoretic and nauseated while sitting down without passing out.  The sensation will pass quickly.  Yesterday, he was working on a lawnmower in his garage and developed diaphoresis.  He then remembers waking up on the ground and thinks he passed out.  No chest pain or dyspnea with event.  We interrogated his device today, he had rapid VT and received 1 shock from his ICD.    Labs (7/13): LDL 77, HDL 34 Labs (4/14): K 4.3, creatinine 2.0 Labs (5/14): K 4.2, creatinine 1.78 Labs (6/14): LDL 26, HDL 83 Labs (9/14): K 3.6, creatinine 2.0, BNP 308 Labs (6/15): K 3.9, creatinine 2.2, HCT 38.2, LDL 74, HDL 31 Labs (12/15): K 4, creatinine 2.3 Labs (1/16): LDL 88,  HDL 32, TGs 251 Labs (2/17): HCT 37.2, K 3.8, creatinine 2.01 Labs (3/17): LDL 74, HDL 28 Labs (7/17): K 4, creatinine 2.26  Corevue: Thoracic impedance stable, 98% BiV pacing, 1 ICD  discharge yesterday  PMH; 1. History of DVT 2. HTN: ACEI cough. 3. Hyperlipidemia 4. CAD s/p CABG in 1991 with redo in 1997.  [place] 2013 with SVG-OM patent and LIMA-LAD patent, other 3 SVGs closed.  Chronic stable angina.  - LHC (2/17) with patent LIMA-LAD, patent SVG-OM, total occlusion of 3 other SVGs (similar to 2013 cath).  5. Ischemic cardiomyopathy: Echo in 4/13 with dilated LV, EF 30%, mildly dilated RV with mildly decreased RV function, moderate MR, mild AI.  - St Jude ICD with CRT upgrade in 9/10.  - Echo (2/17) with EF 30-35%, inferolateral/inferior akinesis, severe LV dilation, moderate MR.  6. Carotid stenosis: 5/14 carotid dopplers with 40-59% bilateral ICA stenosis and occluded right vertebral.  5/15 carotid dopplers with 40-59% bilateral ICA stenosis. 5/16 carotid dopplers with 40-59% BICA stenosis.  7. GERD 8. CKD 9. OA 10. Paroxysmal atrial fibrillation 11. Mitral regurgitation: Moderate on last echo. 12. VT: s/p ablation in 5/13.  - ICD discharge 9/17 13. H/o diverticulitis 14. H/o cholecystectomy 15. H/o appendectomy 16. Renal artery stenosis: Renal artery dopplers (5/14) with 60-99% proximal right renal artery stenosis as well as severe celiac artery stenosis.  17. ABIs (12/14) were normal. 18. Low back pain with sciatica 19. Dementia  SH: Married, lives in [place], 2 kids, quit smoking in 1974.   FH: Mother, sister, and brother with CAD.  ROS: All systems reviewed and negative except as per HPI.   Current Outpatient Prescriptions  Medication Sig Dispense Refill  . atorvastatin (LIPITOR) 20 MG tablet TAKE 1 TABLET BY MOUTH EVERY DAY 30 tablet 6  . carvedilol (COREG) 25 MG tablet TAKE 1 TABLET BY MOUTH TWICE DAILY 180 tablet 3  . donepezil (ARICEPT) 10 MG tablet Take 1 tablet (10 mg total) by mouth at bedtime. 90 tablet 3  . fenofibrate 160 MG tablet Take 1 tablet (160 mg total) by mouth daily. 60 tablet 5  . furosemide (LASIX) 40 MG tablet TAKE 2 TABLETS  BY MOUTH EVERY MORNING AND 1 TABLET BY MOUTH EVERY AFTERNOON 270 tablet 4  . isosorbide mononitrate (IMDUR) 120 MG 24 hr tablet Take 1 tablet (120 mg total) by mouth daily. 30 tablet 3  . losartan (COZAAR) 50 MG tablet Take 1 tablet (50 mg total) by mouth daily. 90 tablet 3  . nitroGLYCERIN (NITROSTAT) 0.4 MG SL tablet PLACE 1 TABLET UNDER THE TONGUE EVERY 5 MINUTES AS NEEDED FOR CHEST PAIN 100 tablet 0  . omeprazole (PRILOSEC) 20 MG capsule TAKE ONE CAPSULE BY MOUTH TWICE DAILY 60 capsule 5  . potassium chloride SA (K-DUR,KLOR-CON) 20 MEQ tablet TAKE 1 TABLET BY MOUTH TWICE DAILY 180 tablet 0  . ranolazine (RANEXA) 1000 MG SR tablet Take 1 tablet (1,000 mg total) by mouth 2 (two) times daily. 60 tablet 11  . warfarin (COUMADIN) 5 MG tablet TAKE AS DIRECTED BY COUMADIN CLINIC 90 tablet 0  . amiodarone (PACERONE) 200 MG tablet Take 200mg  twice daily for 10 days. Then decrease to 200 mg daily. 45 tablet 3   No current facility-administered medications for this encounter.    BP (!) 110/50   Pulse 65   Wt 178 lb 8 oz (81 kg)   SpO2 100%   BMI 23.55 kg/m  General: NAD Neck: No JVD, no thyromegaly or thyroid nodule.  Lungs: Clear to auscultation bilaterally with normal respiratory effort. CV: Nondisplaced PMI.  Heart regular S1/S2, no S3/S4, 3/6 HSM apex.  No peripheral edema.  R carotid bruit.  1+ PT bilaterally.  Abdomen: Soft, nontender, no hepatosplenomegaly, no distention.   Neurologic: Alert and oriented x 3.  Psych: Normal affect. Extremities: No clubbing or cyanosis.   Assessment/Plan:  1. CAD: s/p CABG.  Chronic stable angina, improved on ranolazine.   - Continue ranolazine, Imdur, ARB, statin, and Coreg.   - He does not need aspirin as he has stable CAD and is on warfarin.   2. Chronic systolic CHF:  Ischemic cardiomyopathy.  EF 30-35% on last echo.  He does not appear volume overloaded on exam.  He has a [employer] CRT-D device.  - Continue current Lasix dosing (80 qam, 40 qpm).    - Given CKD, will not titrate losartan at this time or add spironolactone.   - Continue current Coreg (at goal dose).  - BMET today.   3. Hyperlipidemia: LDL acceptable on atorvastatin in 3/17. 4. Mitral regurgitation: Moderate on last echo, murmur is loud.  5. Carotid stenosis: I will arrange for repeat carotid dopplers.   6. Renal artery stenosis: Unilateral renal artery stenosis.  BP is controlled.  I do not think there is a lot of utility to opening this artery in the absence of uncontrolled HTN or bilateral stenosis.   7. Celiac artery stenosis: Severe celiac stenosis on abdominal dopplers.  He does not describe abdominal angina. Continue to follow.  8. Paroxysmal atrial fibrillation: Patient is on  warfarin.  He is in NSR today.  9. CKD: Stable recently, sees nephrology.  10.  VT: Prior VT ablation.  Had syncope yesterday with VT, had ICD shock.  Has had some prior episodes of diaphoresis/nausea that may have been VT as well based on symptoms associated with yesterday's VT.  He does not drive.  No associated chest pain.  I suspect this is scar-mediated VT rather than new ischemia.  - Start amiodarone 200 mg bid x 10 days then 200 mg daily.  Follow LFTs/TSH carefully, will need baseline PFTs.   [name] 10/09/2015

## 2015-10-11 ENCOUNTER — Other Ambulatory Visit (HOSPITAL_COMMUNITY): Payer: Self-pay | Admitting: Cardiology

## 2015-10-11 DIAGNOSIS — I6523 Occlusion and stenosis of bilateral carotid arteries: Secondary | ICD-10-CM

## 2015-10-11 NOTE — Telephone Encounter (Signed)
Spoke with Mrs. Wissner about interaction between amiodarone and Coumadin. Amiodarone may enhance the anticoagulant effect of Coumadin so that he may require dose reductions of his Coumadin over time. Because of amiodarone's long t1/2, this effect may not be seen right away. Mr. Cola has his next Coumadin Clinic appointment next Monday but I have recommended that they call the Coumadin Clinic to see if they have any other recommendations before then. Mrs. Pursifull verbalized understanding and was grateful for the information.   Ruta Hinds. Velva Harman, PharmD, BCPS, CPP Clinical Pharmacist Pager: [phone] Phone: [phone] 10/11/2015 10:12 AM

## 2015-10-15 ENCOUNTER — Ambulatory Visit (INDEPENDENT_AMBULATORY_CARE_PROVIDER_SITE_OTHER): Payer: Medicare Other | Admitting: *Deleted

## 2015-10-15 DIAGNOSIS — Z7901 Long term (current) use of anticoagulants: Secondary | ICD-10-CM | POA: Diagnosis not present

## 2015-10-15 DIAGNOSIS — I059 Rheumatic mitral valve disease, unspecified: Secondary | ICD-10-CM

## 2015-10-15 DIAGNOSIS — Z5181 Encounter for therapeutic drug level monitoring: Secondary | ICD-10-CM

## 2015-10-15 DIAGNOSIS — I480 Paroxysmal atrial fibrillation: Secondary | ICD-10-CM

## 2015-10-15 DIAGNOSIS — I4891 Unspecified atrial fibrillation: Secondary | ICD-10-CM

## 2015-10-15 LAB — POCT INR: INR: 2.8

## 2015-10-16 ENCOUNTER — Ambulatory Visit (HOSPITAL_COMMUNITY)
Admission: RE | Admit: 2015-10-16 | Discharge: 2015-10-16 | Disposition: A | Discharge status: Home / Self Care | Payer: Medicare Other | Source: Ambulatory Visit | Attending: Cardiology | Admitting: Cardiology

## 2015-10-16 ENCOUNTER — Encounter: Payer: Self-pay | Admitting: Internal Medicine

## 2015-10-16 DIAGNOSIS — Z951 Presence of aortocoronary bypass graft: Secondary | ICD-10-CM | POA: Insufficient documentation

## 2015-10-16 DIAGNOSIS — E785 Hyperlipidemia, unspecified: Secondary | ICD-10-CM | POA: Insufficient documentation

## 2015-10-16 DIAGNOSIS — I6523 Occlusion and stenosis of bilateral carotid arteries: Secondary | ICD-10-CM | POA: Insufficient documentation

## 2015-10-16 DIAGNOSIS — Z87891 Personal history of nicotine dependence: Secondary | ICD-10-CM | POA: Insufficient documentation

## 2015-10-16 DIAGNOSIS — I10 Essential (primary) hypertension: Secondary | ICD-10-CM

## 2015-10-16 DIAGNOSIS — I6502 Occlusion and stenosis of left vertebral artery: Secondary | ICD-10-CM | POA: Insufficient documentation

## 2015-10-16 DIAGNOSIS — I2510 Atherosclerotic heart disease of native coronary artery without angina pectoris: Secondary | ICD-10-CM

## 2015-10-17 ENCOUNTER — Inpatient Hospital Stay (HOSPITAL_COMMUNITY)
Admission: EM | Admit: 2015-10-17 | Discharge: 2015-10-19 | DRG: 291 | Disposition: A | Discharge status: Home / Self Care | Payer: Medicare Other | Attending: Internal Medicine | Admitting: Internal Medicine

## 2015-10-17 ENCOUNTER — Emergency Department (HOSPITAL_COMMUNITY): Payer: Medicare Other

## 2015-10-17 ENCOUNTER — Encounter (HOSPITAL_COMMUNITY): Payer: Self-pay | Admitting: Emergency Medicine

## 2015-10-17 DIAGNOSIS — Z8249 Family history of ischemic heart disease and other diseases of the circulatory system: Secondary | ICD-10-CM

## 2015-10-17 DIAGNOSIS — N289 Disorder of kidney and ureter, unspecified: Secondary | ICD-10-CM

## 2015-10-17 DIAGNOSIS — I6523 Occlusion and stenosis of bilateral carotid arteries: Secondary | ICD-10-CM | POA: Diagnosis present

## 2015-10-17 DIAGNOSIS — R7302 Impaired glucose tolerance (oral): Secondary | ICD-10-CM | POA: Diagnosis present

## 2015-10-17 DIAGNOSIS — Z91048 Other nonmedicinal substance allergy status: Secondary | ICD-10-CM

## 2015-10-17 DIAGNOSIS — Z87891 Personal history of nicotine dependence: Secondary | ICD-10-CM | POA: Diagnosis not present

## 2015-10-17 DIAGNOSIS — I480 Paroxysmal atrial fibrillation: Secondary | ICD-10-CM | POA: Diagnosis present

## 2015-10-17 DIAGNOSIS — I340 Nonrheumatic mitral (valve) insufficiency: Secondary | ICD-10-CM | POA: Diagnosis not present

## 2015-10-17 DIAGNOSIS — I5043 Acute on chronic combined systolic (congestive) and diastolic (congestive) heart failure: Secondary | ICD-10-CM | POA: Diagnosis present

## 2015-10-17 DIAGNOSIS — N184 Chronic kidney disease, stage 4 (severe): Secondary | ICD-10-CM | POA: Diagnosis present

## 2015-10-17 DIAGNOSIS — F0390 Unspecified dementia without behavioral disturbance: Secondary | ICD-10-CM | POA: Diagnosis present

## 2015-10-17 DIAGNOSIS — R079 Chest pain, unspecified: Secondary | ICD-10-CM | POA: Diagnosis present

## 2015-10-17 DIAGNOSIS — I472 Ventricular tachycardia, unspecified: Secondary | ICD-10-CM

## 2015-10-17 DIAGNOSIS — R4189 Other symptoms and signs involving cognitive functions and awareness: Secondary | ICD-10-CM | POA: Diagnosis present

## 2015-10-17 DIAGNOSIS — I5023 Acute on chronic systolic (congestive) heart failure: Secondary | ICD-10-CM | POA: Diagnosis present

## 2015-10-17 DIAGNOSIS — N183 Chronic kidney disease, stage 3 unspecified: Secondary | ICD-10-CM | POA: Diagnosis present

## 2015-10-17 DIAGNOSIS — Z95810 Presence of automatic (implantable) cardiac defibrillator: Secondary | ICD-10-CM | POA: Diagnosis not present

## 2015-10-17 DIAGNOSIS — I10 Essential (primary) hypertension: Secondary | ICD-10-CM | POA: Diagnosis not present

## 2015-10-17 DIAGNOSIS — I252 Old myocardial infarction: Secondary | ICD-10-CM

## 2015-10-17 DIAGNOSIS — R55 Syncope and collapse: Secondary | ICD-10-CM

## 2015-10-17 DIAGNOSIS — K219 Gastro-esophageal reflux disease without esophagitis: Secondary | ICD-10-CM | POA: Diagnosis present

## 2015-10-17 DIAGNOSIS — Z7901 Long term (current) use of anticoagulants: Secondary | ICD-10-CM

## 2015-10-17 DIAGNOSIS — I059 Rheumatic mitral valve disease, unspecified: Secondary | ICD-10-CM | POA: Diagnosis present

## 2015-10-17 DIAGNOSIS — I130 Hypertensive heart and chronic kidney disease with heart failure and stage 1 through stage 4 chronic kidney disease, or unspecified chronic kidney disease: Principal | ICD-10-CM | POA: Diagnosis present

## 2015-10-17 DIAGNOSIS — I208 Other forms of angina pectoris: Secondary | ICD-10-CM

## 2015-10-17 DIAGNOSIS — I25810 Atherosclerosis of coronary artery bypass graft(s) without angina pectoris: Secondary | ICD-10-CM | POA: Diagnosis present

## 2015-10-17 DIAGNOSIS — Z888 Allergy status to other drugs, medicaments and biological substances status: Secondary | ICD-10-CM

## 2015-10-17 DIAGNOSIS — I25700 Atherosclerosis of coronary artery bypass graft(s), unspecified, with unstable angina pectoris: Secondary | ICD-10-CM | POA: Diagnosis present

## 2015-10-17 DIAGNOSIS — R0789 Other chest pain: Secondary | ICD-10-CM

## 2015-10-17 DIAGNOSIS — Z880 Allergy status to penicillin: Secondary | ICD-10-CM

## 2015-10-17 DIAGNOSIS — Z79899 Other long term (current) drug therapy: Secondary | ICD-10-CM | POA: Diagnosis not present

## 2015-10-17 DIAGNOSIS — E785 Hyperlipidemia, unspecified: Secondary | ICD-10-CM | POA: Diagnosis present

## 2015-10-17 DIAGNOSIS — I25110 Atherosclerotic heart disease of native coronary artery with unstable angina pectoris: Secondary | ICD-10-CM | POA: Diagnosis present

## 2015-10-17 DIAGNOSIS — I255 Ischemic cardiomyopathy: Secondary | ICD-10-CM | POA: Diagnosis present

## 2015-10-17 DIAGNOSIS — M1712 Unilateral primary osteoarthritis, left knee: Secondary | ICD-10-CM | POA: Diagnosis present

## 2015-10-17 DIAGNOSIS — G4733 Obstructive sleep apnea (adult) (pediatric): Secondary | ICD-10-CM | POA: Diagnosis present

## 2015-10-17 DIAGNOSIS — Z86010 Personal history of colonic polyps: Secondary | ICD-10-CM | POA: Diagnosis not present

## 2015-10-17 DIAGNOSIS — D649 Anemia, unspecified: Secondary | ICD-10-CM

## 2015-10-17 DIAGNOSIS — I459 Conduction disorder, unspecified: Secondary | ICD-10-CM | POA: Diagnosis present

## 2015-10-17 LAB — TROPONIN I
TROPONIN I: 0.04 ng/mL — AB (ref <0.03)
Troponin I: 0.04 ng/mL (ref <0.03)
Troponin I: 0.04 ng/mL (ref <0.03)

## 2015-10-17 LAB — CBC
HEMATOCRIT: 39.3 % (ref 39.0–52.0)
HEMOGLOBIN: 12.6 g/dL — AB (ref 13.0–17.0)
MCH: 28.6 pg (ref 26.0–34.0)
MCHC: 32.1 g/dL (ref 30.0–36.0)
MCV: 89.3 fL (ref 78.0–100.0)
Platelets: 153 10*3/uL (ref 150–400)
RBC: 4.4 MIL/uL (ref 4.22–5.81)
RDW: 15 % (ref 11.5–15.5)
WBC: 6.3 10*3/uL (ref 4.0–10.5)

## 2015-10-17 LAB — URINALYSIS, ROUTINE W REFLEX MICROSCOPIC
BILIRUBIN URINE: NEGATIVE
Glucose, UA: NEGATIVE mg/dL
Hgb urine dipstick: NEGATIVE
Ketones, ur: NEGATIVE mg/dL
LEUKOCYTES UA: NEGATIVE
NITRITE: NEGATIVE
Protein, ur: NEGATIVE mg/dL
SPECIFIC GRAVITY, URINE: 1.012 (ref 1.005–1.030)
pH: 6 (ref 5.0–8.0)

## 2015-10-17 LAB — BASIC METABOLIC PANEL
Anion gap: 12 (ref 5–15)
BUN: 31 mg/dL — AB (ref 6–20)
CHLORIDE: 98 mmol/L — AB (ref 101–111)
CO2: 28 mmol/L (ref 22–32)
Calcium: 9.6 mg/dL (ref 8.9–10.3)
Creatinine, Ser: 2.43 mg/dL — ABNORMAL HIGH (ref 0.61–1.24)
GFR calc Af Amer: 27 mL/min — ABNORMAL LOW (ref >60)
GFR calc non Af Amer: 23 mL/min — ABNORMAL LOW (ref >60)
GLUCOSE: 111 mg/dL — AB (ref 65–99)
POTASSIUM: 4.3 mmol/L (ref 3.5–5.1)
Sodium: 138 mmol/L (ref 135–145)

## 2015-10-17 LAB — PROTIME-INR
INR: 2.61
PROTHROMBIN TIME: 28.4 s — AB (ref 11.4–15.2)

## 2015-10-17 LAB — I-STAT TROPONIN, ED: Troponin i, poc: 0.03 ng/mL (ref 0.00–0.08)

## 2015-10-17 LAB — BRAIN NATRIURETIC PEPTIDE: B NATRIURETIC PEPTIDE 5: 591.8 pg/mL — AB (ref 0.0–100.0)

## 2015-10-17 MED ORDER — POTASSIUM CHLORIDE CRYS ER 20 MEQ PO TBCR
20.0000 meq | EXTENDED_RELEASE_TABLET | Freq: Two times a day (BID) | ORAL | Status: DC
  Administered 2015-10-17 – 2015-10-19 (×5): 20 meq via ORAL
  Filled 2015-10-17 (×5): qty 1

## 2015-10-17 MED ORDER — DIPHENHYDRAMINE HCL 25 MG PO CAPS
50.0000 mg | ORAL_CAPSULE | Freq: Once | ORAL | Status: AC
  Administered 2015-10-17: 50 mg via ORAL
  Filled 2015-10-17: qty 2

## 2015-10-17 MED ORDER — PANTOPRAZOLE SODIUM 40 MG PO TBEC
40.0000 mg | DELAYED_RELEASE_TABLET | Freq: Every day | ORAL | Status: DC
  Administered 2015-10-17 – 2015-10-19 (×3): 40 mg via ORAL
  Filled 2015-10-17 (×3): qty 1

## 2015-10-17 MED ORDER — WARFARIN - PHARMACIST DOSING INPATIENT: Freq: Every day | Status: DC

## 2015-10-17 MED ORDER — ACETAMINOPHEN 325 MG PO TABS
650.0000 mg | ORAL_TABLET | ORAL | Status: DC | PRN
Start: 2015-10-17 — End: 2015-10-19

## 2015-10-17 MED ORDER — SODIUM CHLORIDE 0.9 % IV SOLN
INTRAVENOUS | Status: DC
  Administered 2015-10-17: 09:00:00 via INTRAVENOUS

## 2015-10-17 MED ORDER — LOSARTAN POTASSIUM 50 MG PO TABS
50.0000 mg | ORAL_TABLET | Freq: Every day | ORAL | Status: DC
  Administered 2015-10-17 – 2015-10-19 (×3): 50 mg via ORAL
  Filled 2015-10-17 (×3): qty 1

## 2015-10-17 MED ORDER — WARFARIN SODIUM 2.5 MG PO TABS: 2.5000 mg | ORAL_TABLET | Freq: Every day | ORAL | Status: DC

## 2015-10-17 MED ORDER — FUROSEMIDE 10 MG/ML IJ SOLN
60.0000 mg | Freq: Two times a day (BID) | INTRAMUSCULAR | Status: DC
  Administered 2015-10-17 – 2015-10-18 (×4): 60 mg via INTRAVENOUS
  Filled 2015-10-17 (×5): qty 6

## 2015-10-17 MED ORDER — WARFARIN - PHYSICIAN DOSING INPATIENT: Freq: Every day | Status: DC

## 2015-10-17 MED ORDER — ISOSORBIDE MONONITRATE ER 60 MG PO TB24
120.0000 mg | ORAL_TABLET | Freq: Every day | ORAL | Status: DC
Start: 2015-10-17 — End: 2015-10-19
  Administered 2015-10-17 – 2015-10-19 (×3): 120 mg via ORAL
  Filled 2015-10-17 (×3): qty 2

## 2015-10-17 MED ORDER — FUROSEMIDE 40 MG PO TABS
40.0000 mg | ORAL_TABLET | Freq: Every day | ORAL | Status: DC
  Administered 2015-10-17: 40 mg via ORAL
  Filled 2015-10-17: qty 1

## 2015-10-17 MED ORDER — WARFARIN SODIUM 2.5 MG PO TABS
2.5000 mg | ORAL_TABLET | Freq: Once | ORAL | Status: AC
  Administered 2015-10-17: 2.5 mg via ORAL
  Filled 2015-10-17: qty 1

## 2015-10-17 MED ORDER — CARVEDILOL 25 MG PO TABS
25.0000 mg | ORAL_TABLET | Freq: Two times a day (BID) | ORAL | Status: DC
  Administered 2015-10-17 – 2015-10-19 (×5): 25 mg via ORAL
  Filled 2015-10-17 (×5): qty 1

## 2015-10-17 MED ORDER — ATORVASTATIN CALCIUM 20 MG PO TABS
20.0000 mg | ORAL_TABLET | Freq: Every day | ORAL | Status: DC
  Administered 2015-10-17 – 2015-10-19 (×3): 20 mg via ORAL
  Filled 2015-10-17 (×3): qty 1

## 2015-10-17 MED ORDER — GI COCKTAIL ~~LOC~~: 30.0000 mL | Freq: Four times a day (QID) | ORAL | Status: DC | PRN

## 2015-10-17 MED ORDER — DONEPEZIL HCL 10 MG PO TABS
10.0000 mg | ORAL_TABLET | Freq: Every day | ORAL | Status: DC
  Administered 2015-10-17 – 2015-10-18 (×2): 10 mg via ORAL
  Filled 2015-10-17 (×2): qty 1

## 2015-10-17 MED ORDER — FUROSEMIDE 10 MG/ML IJ SOLN: 40.0000 mg | Freq: Two times a day (BID) | INTRAMUSCULAR | Status: DC

## 2015-10-17 MED ORDER — PROMETHAZINE HCL 25 MG/ML IJ SOLN: 12.5000 mg | Freq: Four times a day (QID) | INTRAMUSCULAR | Status: DC | PRN

## 2015-10-17 MED ORDER — RANOLAZINE ER 500 MG PO TB12
1000.0000 mg | ORAL_TABLET | Freq: Two times a day (BID) | ORAL | Status: DC
  Administered 2015-10-17 – 2015-10-19 (×5): 1000 mg via ORAL
  Filled 2015-10-17 (×5): qty 2

## 2015-10-17 MED ORDER — FENOFIBRATE 160 MG PO TABS
160.0000 mg | ORAL_TABLET | Freq: Every day | ORAL | Status: DC
  Administered 2015-10-17 – 2015-10-19 (×3): 160 mg via ORAL
  Filled 2015-10-17 (×3): qty 1

## 2015-10-17 MED ORDER — AMIODARONE HCL 200 MG PO TABS
200.0000 mg | ORAL_TABLET | Freq: Every day | ORAL | Status: DC
  Administered 2015-10-17 – 2015-10-19 (×3): 200 mg via ORAL
  Filled 2015-10-17 (×3): qty 1

## 2015-10-17 NOTE — ED Triage Notes (Signed)
Pt brought to ED by [facility] from home for 5/10 mid chest pain that started last night before going to bed at 2000 pt states he is been having intermittent cp during the whole week but last night started on his mid cp with bilateral arm numbness that didn't got relieve by resting, pt got 324 mg asa pta to ED and is cp free on ED arrival.

## 2015-10-17 NOTE — ED Notes (Signed)
Admitting MD at the bedside.  

## 2015-10-17 NOTE — ED Provider Notes (Signed)
[facility] DEPT Provider Note   CSN: [id number] Arrival date & time: 10/17/15  0430     History   Chief Complaint Chief Complaint  Patient presents with  . Chest Pain  . Shortness of Breath    HPI Sean [name] is a 82 y.o. male.  The history is provided by the patient.  He had onset about 0200 of pain in both arms and chest. He describes the pain is sharp but not as sharp as prior heart attacks. He rated pain at 6/10. Nothing made it better nothing made it worse. He did take aspirin 324 mg at home. There were his differently later apparently fired and he woke up on the floor. He does not recall any pain from the defibrillator firing. Pain completely resolved at about the time he arrived to the hospital. There was associated dyspnea, nausea, vomiting, diaphoresis.  Past Medical History:  Diagnosis Date  . Atrial fibrillation ([place]) 04/04/2010  . AV BLOCK 05/22/2008  . CAD, ARTERY BYPASS GRAFT 05/22/2008  . CARDIOMYOPATHY, ISCHEMIC 05/22/2008  . CAROTID ARTERY STENOSIS 11/15/2009  . CHF (congestive heart failure) (HCC)    class 2 to 3  . Chronic systolic heart failure ([place]) 10/13/2008  . COLONIC POLYPS, HX OF 04/07/2008  . Decreased left ventricular function   . DEGENERATIVE JOINT DISEASE, LEFT KNEE 04/07/2008  . DIVERTICULITIS, HX OF 04/07/2008  . GERD 04/07/2008  . HYPERLIPIDEMIA 04/07/2008  . HYPERTENSION 04/07/2008  . MITRAL VALVE PROLAPSE, NON-RHEUMATIC 05/22/2008  . RENAL INSUFFICIENCY 10/17/2008  . Shortness of breath dyspnea   . Ventricular tachycardia ([place])    Status post catheter ablation may 2013    Patient Active Problem List   Diagnosis Date Noted  . Abnormal chest x-ray 03/19/2015  . Coronary artery disease involving coronary bypass graft of native heart with unstable angina pectoris ([place])   . Unstable angina ([place]) 03/15/2015  . Cognitive impairment 10/23/2014  . Encounter for therapeutic drug monitoring 02/28/2013  . Leg pain 01/18/2013  . Mitral regurgitation  05/09/2012  . CKD (chronic kidney disease) stage 3, GFR 30-59 ml/min 08/14/2011  . OSA (obstructive sleep apnea) 01/24/2011  . Ventricular tachycardia ([place]) 11/07/2010  . Biventricular implantable cardioverter-defibrillator in situ 11/07/2010  . Long term (current) use of anticoagulants 05/03/2010  . Paroxysmal atrial fibrillation ([place]) 04/04/2010  . CAROTID ARTERY STENOSIS 11/15/2009  . Chronic combined systolic and diastolic heart failure ([place]) 10/13/2008  . Atherosclerosis of coronary artery bypass graft with unstable angina pectoris ([place]) 05/22/2008  . CARDIOMYOPATHY, ISCHEMIC 05/22/2008  . Mitral valve disorder 05/22/2008  . AV BLOCK 05/22/2008  . Hyperlipidemia 04/07/2008  . Essential hypertension 04/07/2008  . GERD 04/07/2008  . DEGENERATIVE JOINT DISEASE, LEFT KNEE 04/07/2008    Past Surgical History:  Procedure Laterality Date  . APPENDECTOMY    . CARDIAC CATHETERIZATION N/A 03/19/2015   Procedure: Left Heart Cath and Coronary Angiography;  Surgeon: Troy Sine, MD;  Location: [facility];  Service: Cardiovascular;  Laterality: N/A;  . CARDIAC SURGERY     open heart 1991, 1997, pacemaker insertion [place] 2010 CD 3231  . CATARACT EXTRACTION Bilateral 2016  . CHOLECYSTECTOMY    . COLONOSCOPY N/A 08/26/2013   Procedure: COLONOSCOPY;  Surgeon: Beryle Beams, MD;  Location: [facility];  Service: Endoscopy;  Laterality: N/A;  . HERNIA REPAIR  1952   2004  . IMPLANTABLE CARDIOVERTER DEFIBRILLATOR GENERATOR CHANGE N/A 07/15/2013   Procedure: IMPLANTABLE CARDIOVERTER DEFIBRILLATOR GENERATOR CHANGE;  Surgeon: Deboraha Sprang, MD;  Location: [facility];  Service: Cardiovascular;  Laterality: N/A;  . INSERT / REPLACE / REMOVE PACEMAKER     [employer] unify CD 3231/2010  . LEFT HEART CATHETERIZATION WITH CORONARY/GRAFT ANGIOGRAM N/A 04/30/2011   Procedure: LEFT HEART CATHETERIZATION WITH Beatrix Fetters;  Surgeon: Hillary Bow, MD;  Location: [facility] CATH LAB;   Service: Cardiovascular;  Laterality: N/A;  . LUNG SURGERY  1987  . THORACOTOMY     secondary to lung mass  . V-TACH ABLATION N/A 06/26/2011   Procedure: V-TACH ABLATION;  Surgeon: Thompson Grayer, MD;  Location: [facility] CATH LAB;  Service: Cardiovascular;  Laterality: N/A;       Home Medications    Prior to Admission medications   Medication Sig Start Date End Date Taking? Authorizing Provider  amiodarone (PACERONE) 200 MG tablet Take 200mg  twice daily for 10 days. Then decrease to 200 mg daily. 10/09/15   Larey Dresser, MD  atorvastatin (LIPITOR) 20 MG tablet TAKE 1 TABLET BY MOUTH EVERY DAY 07/26/15   Larey Dresser, MD  carvedilol (COREG) 25 MG tablet TAKE 1 TABLET BY MOUTH TWICE DAILY 06/13/15   Larey Dresser, MD  donepezil (ARICEPT) 10 MG tablet Take 1 tablet (10 mg total) by mouth at bedtime. 10/23/14   Eulas Post, MD  fenofibrate 160 MG tablet Take 1 tablet (160 mg total) by mouth daily. 12/18/14   Eulas Post, MD  furosemide (LASIX) 40 MG tablet TAKE 2 TABLETS BY MOUTH EVERY MORNING AND 1 TABLET BY MOUTH EVERY AFTERNOON 08/16/15   Larey Dresser, MD  isosorbide mononitrate (IMDUR) 120 MG 24 hr tablet Take 1 tablet (120 mg total) by mouth daily. 10/04/15   Larey Dresser, MD  losartan (COZAAR) 50 MG tablet Take 1 tablet (50 mg total) by mouth daily. 09/03/15   Larey Dresser, MD  nitroGLYCERIN (NITROSTAT) 0.4 MG SL tablet PLACE 1 TABLET UNDER THE TONGUE EVERY 5 MINUTES AS NEEDED FOR CHEST PAIN 11/15/14   Larey Dresser, MD  omeprazole (PRILOSEC) 20 MG capsule TAKE ONE CAPSULE BY MOUTH TWICE DAILY 05/03/15   Eulas Post, MD  potassium chloride SA (K-DUR,KLOR-CON) 20 MEQ tablet TAKE 1 TABLET BY MOUTH TWICE DAILY 07/17/15   Larey Dresser, MD  ranolazine (RANEXA) 1000 MG SR tablet Take 1 tablet (1,000 mg total) by mouth 2 (two) times daily. 03/20/15   Brett Canales, PA-C  warfarin (COUMADIN) 5 MG tablet TAKE AS DIRECTED BY COUMADIN CLINIC 07/13/15   Larey Dresser, MD     Family History Family History  Problem Relation Age of Onset  . Heart disease Mother     CAD, deceased   . Coronary artery disease Mother   . Heart disease Sister   . Heart disease Brother     Social History Social History  Substance Use Topics  . Smoking status: Former Smoker    Packs/day: 0.50    Years: 30.00    Types: Cigarettes    Quit date: 04/24/1972  . Smokeless tobacco: Never Used  . Alcohol use No     Allergies   Carisoprodol; Lisinopril; Tape; Zolpidem tartrate; and Penicillins   Review of Systems Review of Systems  Constitutional: Negative.      Physical Exam Updated Vital Signs BP (!) 119/54 (BP Location: Right Arm)   Pulse 60   Temp 98.6 F (37 C) (Oral)   Resp 24   Ht 6\' 1"  (1.854 m)   Wt 180 lb (81.6 kg)  SpO2 98%   BMI 23.75 kg/m   Physical Exam  Nursing note and vitals reviewed.  82 year old male, resting comfortably and in no acute distress. Vital signs are normal. Oxygen saturation is 98%, which is normal. Head is normocephalic and atraumatic. PERRLA, EOMI. Oropharynx is clear. Neck is nontender and supple without adenopathy or JVD. Back is nontender and there is no CVA tenderness. Lungs are clear without rales, wheezes, or rhonchi. Chest is nontender. Heart has regular rate and rhythm with 2-3 systolic ejection murmur heard at the left sternal border. Abdomen is soft, flat, nontender without masses or hepatosplenomegaly and peristalsis is normoactive. Extremities have no cyanosis or edema, full range of motion is present. Skin is warm and dry without rash. Neurologic: Mental status is normal, cranial nerves are intact, there are no motor or sensory deficits.  ED Treatments / Results  Labs (all labs ordered are listed, but only abnormal results are displayed) Labs Reviewed  BASIC METABOLIC PANEL - Abnormal; Notable for the following:       Result Value   Chloride 98 (*)    Glucose, Bld 111 (*)    BUN 31 (*)    Creatinine,  Ser 2.43 (*)    GFR calc non Af Amer 23 (*)    GFR calc Af Amer 27 (*)    All other components within normal limits  CBC - Abnormal; Notable for the following:    Hemoglobin 12.6 (*)    All other components within normal limits  PROTIME-INR  I-STAT TROPOININ, ED    EKG  EKG Interpretation  Date/Time:  Wednesday October 17 2015 04:36:20 EDT Ventricular Rate:  63 PR Interval:    QRS Duration: 178 QT Interval:  551 QTC Calculation: 565 R Axis:   -29 Text Interpretation:  VENTRICULAR PACED RHYTHM When compared with ECG of 03/17/2015, No significant change was found Confirmed by [facility]  MD, Cassadie Pankonin ([id number]) on 10/17/2015 4:52:47 AM       Radiology Dg Chest 2 View  Result Date: 10/17/2015 CLINICAL DATA:  Midchest pain tonight radiating down the arms. EXAM: CHEST  2 VIEW COMPARISON:  03/19/2015 FINDINGS: Unchanged borderline cardiomegaly. The lungs are clear except for minor linear scarring or atelectasis. No confluent airspace consolidation. No effusions. Normal pulmonary vasculature. Intact appearances of the biventricular transvenous cardiac leads. Prior sternotomy and CABG. IMPRESSION: Unchanged borderline cardiomegaly. No acute cardiopulmonary findings. Electronically Signed   By: Andreas Newport M.D.   On: 10/17/2015 05:31    Procedures Procedures (including critical care time)  Medications Ordered in ED Medications - No data to display   Initial Impression / Assessment and Plan / ED Course  I have reviewed the triage vital signs and the nursing notes.  Pertinent labs & imaging results that were available during my care of the patient were reviewed by me and considered in my medical decision making (see chart for details).  Clinical Course    Chest and arm pain certainly worrisome for acute coronary syndrome. Old records are reviewed and he did have a catheterization showing clotted saphenous vein grafts and ongoing coronary artery disease not amenable to any intervention.  We'll need to interrogate his defibrillator and he will clearly need hospital admission. Recent (two days ago) INR was therapeutic.  Defibrillator interrogation shows no arrhythmia and no shock. Troponin has come back normal and INR is therapeutic. Heparin is withheld because of current therapeutic anticoagulation. He has remained pain-free in the ED. Case is discussed with Dr. Hal Hope of [facility]  hospital[facility] who agrees to admit the patient under observation status.   Final Clinical Impressions(s) / ED Diagnoses   Final diagnoses:  Chest pain, unspecified chest pain type  Syncope, unspecified syncope type  Renal insufficiency  Anticoagulated on warfarin  Normochromic normocytic anemia    New Prescriptions New Prescriptions   No medications on file     Delora Fuel, MD [id number]

## 2015-10-17 NOTE — Plan of Care (Signed)
82 year old male with known history of CAD status post CABG atrial fibrillation pacemaker placement and presents with chest pain and admitted for further management. Presently chest pain-free as per the ER physician.  Sean [name]

## 2015-10-17 NOTE — H&P (Signed)
History and Physical    Sean [name] LYY:[id number] DOB: Sep 27, 1931 DOA: 10/17/2015   PCP: Sean Post, MD   Patient coming from:  Home   Chief Complaint: Chest pain   HPI: Sean [name] is a 82 y.o. male with medical history significant for CAD s/p CABG 1997, ICM, chronic combined CHF,  MR, h/o VT s/p ablation 5/13, renal artery and celiac artery  stenosis, dementia, CKD,  PAF on Coumadin, GERD, OA, presented for evaluation of chest pain. These symptoms began around 2 am,  are described as mild chest tightness with exertion, happening 1 to 2 times a week, worse with mild exertion, diaphoresis and nausea. He reports these symptoms are similar to his prior heart attacks. He took ASA 324 at home, and  on transport, he took NTG and 4 ASA with relief. He is chest pain free at this time. 2 days ago the patient had one episode of syncope while working at the yard.  He was seen by his cardiologist at the office, he was interrogated, showing rapid VT receiving one shock from his ICD. He does report several episodes of presyncope on exertion   Denies any cough. Denies any fever or chills. Denies any  vomiting or abdominal pain. Appetite is normal and eats healthy foods. Denies any leg swelling or calf pain. Denies any headaches or vision changes. Denies any seizures or confusion.No recent long distance trips. Denies any new stressors.  Not on hormonal therapy.  No new herbal supplements. Amiodarone was recently added to his regimen for VT.  Does not smoke. No ETOH or recreational drugs.  His cardiologist is Sean [name].    ED Course:  BP 119/56 (BP Location: Right Arm)   Pulse 60   Temp 98.5 F (36.9 C) (Oral)   Resp 23   Ht 6' 1"  (1.854 m)   Wt 81.6 kg (180 lb)   SpO2 96%   BMI 23.75 kg/m    Last 2 D echo 05/2015 showed  EF 30-35%, inferolateral/inferior akinesis, severe LV dilation, moderate MR  Carotid dopplers: 5/14 carotid dopplers with 40-59% bilateral ICA stenosis and occluded  right vertebral.  5/15 carotid dopplers with 40-59% bilateral ICA stenosis. 5/16 carotid dopplers with 40-59% BICA stenosis.  BUN  31 creatinine 2.43. EGFR 23  Hemoglobin 12.6 EKG shows ventricular paste review them, without significant changes from prior EKG on 03/17/2015. Troponin was normal. INR therapeutic Defibrillator interrogation at the ER showed no arrhythmia or shock  Review of Systems: As per HPI otherwise 10 point review of systems negative.   Past Medical History:  Diagnosis Date  . Atrial fibrillation ([place]) 04/04/2010  . AV BLOCK 05/22/2008  . CAD, ARTERY BYPASS GRAFT 05/22/2008  . CARDIOMYOPATHY, ISCHEMIC 05/22/2008  . CAROTID ARTERY STENOSIS 11/15/2009  . CHF (congestive heart failure) (HCC)    class 2 to 3  . Chronic systolic heart failure ([place]) 10/13/2008  . COLONIC POLYPS, HX OF 04/07/2008  . Decreased left ventricular function   . DEGENERATIVE JOINT DISEASE, LEFT KNEE 04/07/2008  . DIVERTICULITIS, HX OF 04/07/2008  . GERD 04/07/2008  . HYPERLIPIDEMIA 04/07/2008  . HYPERTENSION 04/07/2008  . MITRAL VALVE PROLAPSE, NON-RHEUMATIC 05/22/2008  . RENAL INSUFFICIENCY 10/17/2008  . Shortness of breath dyspnea   . Ventricular tachycardia ([place])    Status [name] catheter ablation may 2013    Past Surgical History:  Procedure Laterality Date  . APPENDECTOMY    . CARDIAC CATHETERIZATION N/A 03/19/2015   Procedure: Left Heart Cath and Coronary Angiography;  Surgeon: Troy Sine, MD;  Location: [place] [facility];  Service: Cardiovascular;  Laterality: N/A;  . CARDIAC SURGERY     open heart 1991, 1997, pacemaker insertion [place] 2010 CD 3231  . CATARACT EXTRACTION Bilateral 2016  . CHOLECYSTECTOMY    . COLONOSCOPY N/A 08/26/2013   Procedure: COLONOSCOPY;  Surgeon: Beryle Beams, MD;  Location: [facility];  Service: Endoscopy;  Laterality: N/A;  . HERNIA REPAIR  1952   2004  . IMPLANTABLE CARDIOVERTER DEFIBRILLATOR GENERATOR CHANGE N/A 07/15/2013   Procedure: IMPLANTABLE CARDIOVERTER  DEFIBRILLATOR GENERATOR CHANGE;  Surgeon: Deboraha Sprang, MD;  Location: [facility] CATH LAB;  Service: Cardiovascular;  Laterality: N/A;  . INSERT / REPLACE / REMOVE PACEMAKER     [employer] unify CD 3231/2010  . LEFT HEART CATHETERIZATION WITH CORONARY/GRAFT ANGIOGRAM N/A 04/30/2011   Procedure: LEFT HEART CATHETERIZATION WITH Beatrix Fetters;  Surgeon: Hillary Bow, MD;  Location: [facility] CATH LAB;  Service: Cardiovascular;  Laterality: N/A;  . LUNG SURGERY  1987  . THORACOTOMY     secondary to lung mass  . V-TACH ABLATION N/A 06/26/2011   Procedure: V-TACH ABLATION;  Surgeon: Thompson Grayer, MD;  Location[facility] CATH LAB;  Service: Cardiovascular;  Laterality: N/A;    Social History Social History   Social History  . Marital status: Married    Spouse name: N/A  . Number of children: 2  . Years of education: N/A   Occupational History  .  Retired   Social History Main Topics  . Smoking status: Former Smoker    Packs/day: 0.50    Years: 30.00    Types: Cigarettes    Quit date: 04/24/1972  . Smokeless tobacco: Never Used  . Alcohol use No  . Drug use: No  . Sexual activity: Not on file   Other Topics Concern  . Not on file   Social History Narrative  . No narrative on file     Allergies  Allergen Reactions  . Carisoprodol     REACTION: rash  . Lisinopril     REACTION: cough  . Tape     Cloth Tape tears skin  . Zolpidem Tartrate Other (See Comments)    Altered mental status  . Penicillins Rash    Has patient had a PCN reaction causing immediate rash, facial/tongue/throat swelling, SOB or lightheadedness with hypotension:YES Has patient had a PCN reaction causing severe rash involving mucus membranes or skin necrosis: NO Has patient had a PCN reaction that required hospitalization NO Has patient had a PCN reaction occurring within the last 10 years: NO If all of the above answers are "NO", then may proceed with Cephalosporin use.    Family History  Problem Relation  Age of Onset  . Heart disease Mother     CAD, deceased   . Coronary artery disease Mother   . Heart disease Sister   . Heart disease Brother       Prior to Admission medications   Medication Sig Start Date End Date Taking? Authorizing Provider  amiodarone (PACERONE) 200 MG tablet Take 237m twice daily for 10 days. Then decrease to 200 mg daily. 10/09/15  Yes DLarey Dresser MD  atorvastatin (LIPITOR) 20 MG tablet TAKE 1 TABLET BY MOUTH EVERY DAY 07/26/15  Yes DLarey Dresser MD  carvedilol (COREG) 25 MG tablet TAKE 1 TABLET BY MOUTH TWICE DAILY 06/13/15  Yes DLarey Dresser MD  donepezil (ARICEPT) 10 MG tablet Take 1 tablet (10 mg total) by  mouth at bedtime. 10/23/14  Yes Sean Post, MD  fenofibrate 160 MG tablet Take 1 tablet (160 mg total) by mouth daily. 12/18/14  Yes Sean Post, MD  furosemide (LASIX) 40 MG tablet TAKE 2 TABLETS BY MOUTH EVERY MORNING AND 1 TABLET BY MOUTH EVERY AFTERNOON 08/16/15  Yes Larey Dresser, MD  isosorbide mononitrate (IMDUR) 120 MG 24 hr tablet Take 1 tablet (120 mg total) by mouth daily. 10/04/15  Yes Larey Dresser, MD  losartan (COZAAR) 50 MG tablet Take 1 tablet (50 mg total) by mouth daily. 09/03/15  Yes Larey Dresser, MD  nitroGLYCERIN (NITROSTAT) 0.4 MG SL tablet PLACE 1 TABLET UNDER THE TONGUE EVERY 5 MINUTES AS NEEDED FOR CHEST PAIN 11/15/14  Yes Larey Dresser, MD  omeprazole (PRILOSEC) 20 MG capsule TAKE ONE CAPSULE BY MOUTH TWICE DAILY 05/03/15  Yes Sean Post, MD  potassium chloride SA (K-DUR,KLOR-CON) 20 MEQ tablet TAKE 1 TABLET BY MOUTH TWICE DAILY 07/17/15  Yes Larey Dresser, MD  ranolazine (RANEXA) 1000 MG SR tablet Take 1 tablet (1,000 mg total) by mouth 2 (two) times daily. 03/20/15  Yes Brett Canales, PA-C  warfarin (COUMADIN) 5 MG tablet Take 2.5 mg by mouth daily.   Yes Historical Provider, MD  warfarin (COUMADIN) 5 MG tablet TAKE AS DIRECTED BY COUMADIN CLINIC Patient not taking: Reported on 10/17/2015 07/13/15   Larey Dresser, MD    Physical Exam:    Vitals:   10/17/15 0600 10/17/15 0615 10/17/15 0630 10/17/15 0721  BP: (!) 116/50 (!) 118/51 113/55 119/56  Pulse: (!) 58 (!) 59 (!) 59 60  Resp: 20 20 23 23   Temp:    98.5 F (36.9 C)  TempSrc:    Oral  SpO2: 95% 97% 97% 96%  Weight:      Height:           Constitutional: NAD, calm, comfortable  Vitals:   10/17/15 0600 10/17/15 0615 10/17/15 0630 10/17/15 0721  BP: (!) 116/50 (!) 118/51 113/55 119/56  Pulse: (!) 58 (!) 59 (!) 59 60  Resp: 20 20 23 23   Temp:    98.5 F (36.9 C)  TempSrc:    Oral  SpO2: 95% 97% 97% 96%  Weight:      Height:       Eyes: PERRL, lids and conjunctivae normal ENMT: Mucous membranes are moist. Posterior pharynx clear of any exudate or lesions.Normal dentition.  Neck: normal, supple, no masses, no thyromegaly Respiratory: clear to auscultation bilaterally, no wheezing, no crackles. Normal respiratory effort. No accessory muscle use.  Cardiovascular: Regular rate and rhythm, 2/6 murmurs / rubs / gallops. No extremity edema. 2+ pedal pulses. No carotid bruits.  Abdomen: no tenderness, no masses palpated. No hepatosplenomegaly. Bowel sounds positive.  Musculoskeletal: no clubbing / cyanosis. No joint deformity upper and lower extremities. Good ROM, no contractures. Normal muscle tone.  Skin: no rashes, lesions, ulcers.  Neurologic: CN 2-12 grossly intact. Sensation intact, DTR normal. Strength 5/5 in all 4.  Psychiatric: Normal judgment and insight. Alert and oriented x 3. Normal mood.     Labs on Admission: I have personally reviewed following labs and imaging studies  CBC:  Recent Labs Lab 10/17/15 0459  WBC 6.3  HGB 12.6*  HCT 39.3  MCV 89.3  PLT 106    Basic Metabolic Panel:  Recent Labs Lab 10/17/15 0459  NA 138  K 4.3  CL 98*  CO2 28  GLUCOSE 111*  BUN 31*  CREATININE 2.43*  CALCIUM 9.6    GFR: Estimated Creatinine Clearance: 26.5 mL/min (by C-G formula based on SCr of 2.43  mg/dL (H)).  Liver Function Tests: No results for input(s): AST, ALT, ALKPHOS, BILITOT, PROT, ALBUMIN in the last 168 hours. No results for input(s): LIPASE, AMYLASE in the last 168 hours. No results for input(s): AMMONIA in the last 168 hours.  Coagulation Profile:  Recent Labs Lab 10/15/15 0742  INR 2.8    Cardiac Enzymes: No results for input(s): CKTOTAL, CKMB, CKMBINDEX, TROPONINI in the last 168 hours.  BNP (last 3 results) No results for input(s): PROBNP in the last 8760 hours.  HbA1C: No results for input(s): HGBA1C in the last 72 hours.  CBG: No results for input(s): GLUCAP in the last 168 hours.  Lipid Profile: No results for input(s): CHOL, HDL, LDLCALC, TRIG, CHOLHDL, LDLDIRECT in the last 72 hours.  Thyroid Function Tests: No results for input(s): TSH, T4TOTAL, FREET4, T3FREE, THYROIDAB in the last 72 hours.  Anemia Panel: No results for input(s): VITAMINB12, FOLATE, FERRITIN, TIBC, IRON, RETICCTPCT in the last 72 hours.  Urine analysis:    Component Value Date/Time   COLORURINE YELLOW 01/21/2009 0947   APPEARANCEUR CLOUDY (A) 01/21/2009 0947   LABSPEC 1.012 01/21/2009 0947   PHURINE 6.0 01/21/2009 0947   GLUCOSEU NEGATIVE 01/21/2009 0947   HGBUR NEGATIVE 01/21/2009 0947   BILIRUBINUR n 04/03/2011 1113   KETONESUR NEGATIVE 01/21/2009 0947   PROTEINUR n 04/03/2011 1113   PROTEINUR NEGATIVE 01/21/2009 0947   UROBILINOGEN 1.0 04/03/2011 1113   UROBILINOGEN 1.0 01/21/2009 0947   NITRITE n 04/03/2011 1113   NITRITE NEGATIVE 01/21/2009 0947   LEUKOCYTESUR Negative 04/03/2011 1113    Sepsis Labs: @LABRCNTIP (procalcitonin:4,lacticidven:4) )No results found for this or any previous visit (from the past 240 hour(s)).   Radiological Exams on Admission: Dg Chest 2 View  Result Date: 10/17/2015 CLINICAL DATA:  Midchest pain tonight radiating down the arms. EXAM: CHEST  2 VIEW COMPARISON:  03/19/2015 FINDINGS: Unchanged borderline cardiomegaly. The lungs  are clear except for minor linear scarring or atelectasis. No confluent airspace consolidation. No effusions. Normal pulmonary vasculature. Intact appearances of the biventricular transvenous cardiac leads. Prior sternotomy and CABG. IMPRESSION: Unchanged borderline cardiomegaly. No acute cardiopulmonary findings. Electronically Signed   By: Andreas Newport M.D.   On: 10/17/2015 05:31    EKG: Independently reviewed.  Assessment/Plan Active Problems:   Pain in the chest   Pre-syncope   Hyperlipidemia   Essential hypertension   Atherosclerosis of coronary artery bypass graft with unstable angina pectoris (HCC)   Mitral valve disorder   AV BLOCK   Chronic combined systolic and diastolic heart failure (HCC)   GERD   Paroxysmal atrial fibrillation ([place])   Long term (current) use of anticoagulants   Ventricular tachycardia (HCC)   Biventricular implantable cardioverter-defibrillator in situ   OSA (obstructive sleep apnea)   CKD (chronic kidney disease) stage 3, GFR 30-59 ml/min   Cognitive impairment   Chest pain syndrome/known CAD s/p CABG s/p MI. Chest pain  episodes have been more frequent prior to admission. Last 2-D echo  2017 showed stable ejection fraction at 30 to 35%.  HEART score 6. Troponin 0.03 , EKG without evidence of  changes for ACS. CP relieved by nitroglycerin  aspirin. CXR unrevealing. 2 D echo  05/2015 showed  EF 30-35%, inferolateral/inferior akinesis, severe LV dilation, moderate MR. Currently chest pain free  Admit to Telemetry/ Observation Chest pain order set Cycle troponins EKG in am continue ASA,  O2 and NTG as needed Coumadin as per Cards discretion Continue preadmission beta blocker and nitrate Statins  GI cocktail Check Lipid panel  Hb A1C Consult to Cards, patient will be kept NPO with sips for meds in case that cardiac catheterization is indicated. Can resume heart healthy diet if no cath is planned   Pre Syncope in a patient with a history of ICM  , carotid artery disease and atrial fibrillation. Last carotid ultrasound 06/2013 with about 50 percent BCAS   EKG  without significant changes from pior  Troponin was normal. INR therapeutic 2.8. Neuro exam unremarkable. No seizure activity noted . VSS, Afebrile, Sat O2 normal  Orthostatics   Gentle IV fluids EKG in am Urinalysis  Pacemaker interrogation   Atrial Fibrillation CHA2DS2-VASc score 5, on chronic Coumadin , s/p PMP  Interrogation showed no arrythmia or shock at the ER   Rate controlled -Continue meds  Coumadin as per Cardiology discretion   Chronic combined systolic and diastolic CHF.CXR unrevealing. Not hypoxic or increase WOB. Current weight 84.3 kg Continue home meds  -monitor I/Os -daily weights -prn 02   Hypertension BP 119/56 (BP Location: Right Arm)   Pulse 60   Temp 98.5 F (36.9 C) (Oral)   Resp 23   Ht 6' 1"  (1.854 m)   Wt 81.6 kg (180 lb)   SpO2 96%   BMI 23.75 kg/m  Controlled Continue home anti-hypertensive medications  May hold if Orthostatic is found Other recommendations as per Cards   Chronic kidney disease stage creatinine 2.43., baseline 2.2  EGFR 23  Lab Results  Component Value Date   CREATININE 2.43 (H) 10/17/2015   CREATININE 2.27 (H) 10/09/2015   CREATININE 2.26 (H) 08/06/2015   IVF Repeat CMET in am   Hyperlipidemia Continue home statins   GERD, no acute symptoms: Continue PPI   Dementia Continue Aricept    DVT prophylaxis: Coumadin   Code Status:   Full     Family Communication:  Discussed with patient wife  Disposition Plan: Expect patient to be discharged to home after condition improves Consults called:   Cardiology Admission status:Tele  Obs  I  Sharene Butters E, PA-C [facility]   If 7PM-7AM, please contact night-coverage [email or web address] Password [facility]  10/17/2015, 7:33 AM

## 2015-10-17 NOTE — Consult Note (Signed)
Cardiology Consult    Patient ID: Sean [name] MRN: [id number], DOB/AGE: [birth year]   Admit date: 10/17/2015 Date of Consult: 10/17/2015  Primary Physician: Eulas Post, MD Reason for Consult: Chest pain Primary Cardiologist: Dr. Aundra Dubin Requesting Provider: Dr. Marily Memos  Patient Profile  Sean [name] is a 82 year old male with a past medical history of CAD s/p CABG 1991 with redo in 1997, ischemic cardiomyopathy s/p ICD CRT upgrade upgrade in 9/10, CKD, and PAF. He presented to the ED on 10/17/15 with chest pain and associated SOB and diaphoresis.   History of Present Illness  Sean [name] got up this morning to walk his dogs. He felt weak and had to sit down. He eventually got off the couch and walk the dog outside but only stayed out for a minute because he felt so weak. He reported having left-sided chest pain with radiation to both arms. This is reminiscent of his previous angina. He describes the pain as a heaviness that just remained for a long period of time. He continued to have shortness of breath and weakness so he came to the emergency room.  EKG on arrival to the emergency room showed ventricular paced rhythm. His troponin was negative.  His last cath was in February 2017 that showed a patent LIMA to LAD, all other 3 remaining grafts were occluded.Echo in 2/17 showed stable EF 30-35%.   Was done in February as he ran out of Ranexa and did not refill it and began to have more frequent chest pain with less exertion. His cath report from 2017 was similar to his last cath in 2013, so there were no good interventional options.  Dr. Aundra Dubin saw him earlier this month, and the patient reported having several episodes of diaphoresis and nausea. He had an episode of rapid VT and had a shock from his ICD on 10/08/2015. As a result Dr. Aundra Dubin started him on amiodarone.  Patient says that he has had more frequent diarrhea since starting on amiodarone. He is on good antianginal therapy  with maximum dose Ranexa, 120 mg daily of isosorbide, and 25 mg of Coreg twice daily.   Past Medical History   Past Medical History:  Diagnosis Date  . Atrial fibrillation ([place]) 04/04/2010  . AV BLOCK 05/22/2008  . CAD, ARTERY BYPASS GRAFT 05/22/2008  . CARDIOMYOPATHY, ISCHEMIC 05/22/2008  . CAROTID ARTERY STENOSIS 11/15/2009  . CHF (congestive heart failure) (HCC)    class 2 to 3  . Chronic systolic heart failure ([place]) 10/13/2008  . COLONIC POLYPS, HX OF 04/07/2008  . Decreased left ventricular function   . DEGENERATIVE JOINT DISEASE, LEFT KNEE 04/07/2008  . DIVERTICULITIS, HX OF 04/07/2008  . GERD 04/07/2008  . HYPERLIPIDEMIA 04/07/2008  . HYPERTENSION 04/07/2008  . MITRAL VALVE PROLAPSE, NON-RHEUMATIC 05/22/2008  . RENAL INSUFFICIENCY 10/17/2008  . Shortness of breath dyspnea   . Ventricular tachycardia ([place])    Status post catheter ablation may 2013    Past Surgical History:  Procedure Laterality Date  . APPENDECTOMY    . CARDIAC CATHETERIZATION N/A 03/19/2015   Procedure: Left Heart Cath and Coronary Angiography;  Surgeon: Troy Sine, MD;  Location: [place];  Service: Cardiovascular;  Laterality: N/A;  . CARDIAC SURGERY     open heart 1991, 1997, pacemaker insertion [place] 2010 CD 3231  . CATARACT EXTRACTION Bilateral 2016  . CHOLECYSTECTOMY    . COLONOSCOPY N/A 08/26/2013   Procedure: COLONOSCOPY;  Surgeon: Beryle Beams, MD;  Location:  [facility];  Service: Endoscopy;  Laterality: N/A;  . HERNIA REPAIR  1952   2004  . IMPLANTABLE CARDIOVERTER DEFIBRILLATOR GENERATOR CHANGE N/A 07/15/2013   Procedure: IMPLANTABLE CARDIOVERTER DEFIBRILLATOR GENERATOR CHANGE;  Surgeon: Deboraha Sprang, MD;  Location[facility] CATH LAB;  Service: Cardiovascular;  Laterality: N/A;  . INSERT / REPLACE / REMOVE PACEMAKER     [employer] unify CD 3231/2010  . LEFT HEART CATHETERIZATION WITH CORONARY/GRAFT ANGIOGRAM N/A 04/30/2011   Procedure: LEFT HEART CATHETERIZATION WITH Beatrix Fetters;   Surgeon: Hillary Bow, MD;  Location: [facility] CATH LAB;  Service: Cardiovascular;  Laterality: N/A;  . LUNG SURGERY  1987  . THORACOTOMY     secondary to lung mass  . V-TACH ABLATION N/A 06/26/2011   Procedure: V-TACH ABLATION;  Surgeon: Thompson Grayer, MD;  Location: [facility] CATH LAB;  Service: Cardiovascular;  Laterality: N/A;     Allergies  Allergies  Allergen Reactions  . Carisoprodol     REACTION: rash  . Lisinopril     REACTION: cough  . Tape     Cloth Tape tears skin  . Zolpidem Tartrate Other (See Comments)    Altered mental status  . Penicillins Rash    Has patient had a PCN reaction causing immediate rash, facial/tongue/throat swelling, SOB or lightheadedness with hypotension:YES Has patient had a PCN reaction causing severe rash involving mucus membranes or skin necrosis: NO Has patient had a PCN reaction that required hospitalization NO Has patient had a PCN reaction occurring within the last 10 years: NO If all of the above answers are "NO", then may proceed with Cephalosporin use.    Inpatient Medications    . amiodarone  200 mg Oral Daily  . atorvastatin  20 mg Oral Daily  . carvedilol  25 mg Oral BID WC  . donepezil  10 mg Oral QHS  . fenofibrate  160 mg Oral Daily  . furosemide  40 mg Oral Daily  . isosorbide mononitrate  120 mg Oral Daily  . losartan  50 mg Oral Daily  . pantoprazole  40 mg Oral Daily  . potassium chloride SA  20 mEq Oral BID  . ranolazine  1,000 mg Oral BID  . warfarin  2.5 mg Oral q1800  . Warfarin - Physician Dosing Inpatient   Does not apply q4    Family History    Family History  Problem Relation Age of Onset  . Heart disease Mother     CAD, deceased   . Coronary artery disease Mother   . Heart disease Sister   . Heart disease Brother     Social History    Social History   Social History  . Marital status: Married    Spouse name: N/A  . Number of children: 2  . Years of education: N/A   Occupational History  .   Retired   Social History Main Topics  . Smoking status: Former Smoker    Packs/day: 0.50    Years: 30.00    Types: Cigarettes    Quit date: 04/24/1972  . Smokeless tobacco: Never Used  . Alcohol use No  . Drug use: No  . Sexual activity: Not on file   Other Topics Concern  . Not on file   Social History Narrative  . No narrative on file     Review of Systems    General:  No chills, fever, night sweats or weight changes.  Cardiovascular:  + chest pain, + dyspnea on exertion, edema, + orthopnea,  palpitations, paroxysmal nocturnal dyspnea. Dermatological: No rash, lesions/masses Respiratory: No cough, dyspnea Urologic: No hematuria, dysuria Abdominal:   No nausea, vomiting, diarrhea, bright red blood per rectum, melena, or hematemesis Neurologic:  No visual changes, wkns, changes in mental status. All other systems reviewed and are otherwise negative except as noted above.  Physical Exam    Blood pressure (!) 125/55, pulse 62, temperature 98 F (36.7 C), temperature source Oral, resp. rate 20, height 6\' 1"  (1.854 m), weight 185 lb 12.8 oz (84.3 kg), SpO2 98 %.  General: Pleasant, NAD Psych: Normal affect. Neuro: Alert and oriented X 3. Moves all extremities spontaneously. HEENT: Normal  Neck: JVP 8-9 cm with HJR Lungs:  Resp regular and unlabored, crackles in bases.  Heart: RRR no s3, s4.  3/6 HSM apex.  Trace ankle edema.  Abdomen: Soft, non-tender, non-distended, BS + x 4.  Extremities: No clubbing, cyanosis or edema. DP/PT/Radials 2+ and equal bilaterally.  Labs    Troponin [facility] Test)  Recent Labs  10/17/15 0504  TROPIPOC 0.03    Lab Results  Component Value Date   WBC 6.3 10/17/2015   HGB 12.6 (L) 10/17/2015   HCT 39.3 10/17/2015   MCV 89.3 10/17/2015   PLT 153 10/17/2015    Recent Labs Lab 10/17/15 0459  NA 138  K 4.3  CL 98*  CO2 28  BUN 31*  CREATININE 2.43*  CALCIUM 9.6  GLUCOSE 111*   Lab Results  Component Value Date   CHOL  150 04/10/2015   HDL 28 (L) 04/10/2015   LDLCALC 74 04/10/2015   TRIG 241 (H) 04/10/2015   No results found for: [facility]   Radiology Studies    Dg Chest 2 View  Result Date: 10/17/2015 CLINICAL DATA:  Midchest pain tonight radiating down the arms. EXAM: CHEST  2 VIEW COMPARISON:  03/19/2015 FINDINGS: Unchanged borderline cardiomegaly. The lungs are clear except for minor linear scarring or atelectasis. No confluent airspace consolidation. No effusions. Normal pulmonary vasculature. Intact appearances of the biventricular transvenous cardiac leads. Prior sternotomy and CABG. IMPRESSION: Unchanged borderline cardiomegaly. No acute cardiopulmonary findings. Electronically Signed   By: Andreas Newport M.D.   On: 10/17/2015 05:31    EKG & Cardiac Imaging    EKG: V paced  Echocardiogram: 03/18/15 Study Conclusions  - Left ventricle: The cavity size was severely dilated. Wall   thickness was increased in a pattern of mild LVH. Systolic   function was moderately to severely reduced. The estimated   ejection fraction was in the range of 30% to 35%. Diffuse   hypokinesis. There is akinesis of the inferolateral and inferior   myocardium. - Aortic valve: There was mild regurgitation. - Mitral valve: There was moderate regurgitation. - Left atrium: The atrium was severely dilated.  Impressions:  - Akinesis of the inferior, inferoseptal and inferior lateral walls   with overall moderate to severe LV dysfunction; thickened aortic   valve; appears to open well but velocity not performed; mild AI;   moderate MR; severe LAE; mild TR.  Left Heart Cath and Coronary Angiography Left Heart Cath and Coronary Angiography  Conclusion    Mid LAD-1 lesion, 95% stenosed.  Mid LAD-2 lesion, 100% stenosed.  Prox Cx lesion, 100% stenosed.  Prox RCA lesion, 85% stenosed.  Mid RCA lesion, 100% stenosed.  LIMA was injected is normal in caliber, and is anatomically normal.  Patent LIMA graft  which supplies the mid LAD  SVG was injected .  There is mild disease  in the graft.  Patent vein graft which supplies the distal circumflex vessel. There is mild 30% proximal graft narrowing and a widely patent stent in the mid distal portion of this graft. There is collateralization to the distal RCA from the marginal branch supplied by this graft.  SVG .  Origin lesion, 100% stenosed.  SVG was injected .  There is severe disease in the graft.  SVG to a marginal 1 vessel was totally occluded proximal to a previously placed very proximal stent.  Prox Graft lesion, 100% stenosed. The lesion was previously treated with a stent (unknown type).  SVG .  There is severe disease in the graft.  Occluded vein graft at its origin. The exact location to where this graft anastomosed into is uncertain.  Origin to Prox Graft lesion, 100% stenosed.  Origin lesion, 100% stenosed.  LM lesion, 80% stenosed.   Severe native coronary obstructive disease with 80% distal left main stenosis, 95% proximal followed by total occlusion of the LAD after the first septal perforating artery; occluded proximal left circumflex: Artery, and total proximal occlusion of the RCA after the initial RV marginal vessel with faint bridging collaterals to the distal RCA from this marginal branch and also collateralization from the distal circumflex marginal supplied by the vein graft.  Patent LIMA to the LAD.  Patent vein graft which supplies the distal marginal vessel which has mild 30% proximal narrowing in the vein graft and a widely patent mid distal stent in the body of the graft.  Occlusion of all 3 remaining saphenous vein grafts.  RECOMMENDATION: The patient's catheterization findings are similar to his last catheterization in 2013.  Increased medical therapy will be recommended.  Angiograms will be reviewed with Dr. Aundra Dubin, the patient's primary cardiologist.  He will be hydrated post procedure.      Assessment & Plan  1. Chronic angina: patient presents with his usual angina with associated SOB and diaphoresis. He main compliant today is diaphoresis and nausea. I wonder if the Amiodarone that was started earlier this month is causing his nausea. He has also had more frequent diarrhea.   Its hard to say if his diaphoresis is coming from unstable angina or if he is having more frequent VT. He had an episode of prolonged VT on 10/08/15 that required shock x 1. We need to interrogate his pacer this admission to see if he is having VT still.   He is on good anti-anginal medications with maximum dose Ranexa and 120mg  daily of isosorbide.   2. Ischemic cardiomyopathy: Last EF was 30-35%. He has ICD with CRT upgrade in 2010. Weighs himself daily and says that his weight has fluctuated between 1-2 pounds the last month. Does not appear massively volume overloaded on exam but has crackles in bases. Would likely benefit from a dose of IV Lasix today.  3. Paroxysmal atrial fibrillation: On Coumadin for anticoagulation. Pharmacy is following closely as he is also on amiodarone.  This patients CHA2DS2-VASc Score and unadjusted Ischemic Stroke Rate (% per year) is equal to 7.2 % stroke rate/year from a score of 5 Above score calculated as 1 point each if present [CHF, HTN, DM, Vascular=MI/PAD/Aortic Plaque, Age if 65-74, or Male], 2 points each if present [Age > 75, or Stroke/TIA/TE]   Signed, Arbutus Leas, NP 10/17/2015, 7:55 AM Pager: [phone]  Patient seen with NP, agree with the above note.  He felt profoundly weak and short of breath this morning when letting the dogs out so came  to the ER.  He has had increased dyspnea x 2 weeks.  Has had to shorten his walk from 1 mile to 1/4 mile and has to stop a couple of times during that 1/4 mile because of dyspnea.  Also has has some nausea (pre-dated amiodarone) and diarrhea (since amiodarone).  Device interrogated today, suggestive of some fluid  overload but no further VT/VF.  He is in NSR.  His angina is unchanged at his baseline.   1. CAD: s/p CABG.  Chronic stable angina, unchanged currently.  I think that his symptoms are due more to volume overload than angina.   - Continue ranolazine, Imdur, ARB, statin, and Coreg.   - He does not need aspirin as he has stable CAD and is on warfarin.   2. Acute on chronic systolic CHF:  Ischemic cardiomyopathy.  EF 30-35% on last echo.  He has a [employer] CRT-D device.  I do think that he is volume overloaded on exam today.  He has had increased exertional dyspnea recently.  - Lasix 60 mg IV bid.   - Continue current losartan as long as creatinine remains stable.    - Continue current Coreg (at goal dose).  - Echo today.  3. Mitral regurgitation: Moderate on last echo, murmur is loud.  Getting repeat echo.  4. Paroxysmal atrial fibrillation: Patient is on warfarin.  He is in NSR today.  5. CKD: Stable recently, sees nephrology. Follow closely with diuressi.  6.  VT: Prior VT ablation.  VT on 9/4 with ICD discharge.  Amiodarone started.  He has not had further VT on ICD interrogation.  - Continue amiodarone, will decrease dose to 200 mg daily as it may have caused some loose stool. Will check LFTs in am.   [name] 10/17/2015 12:04 PM

## 2015-10-17 NOTE — Progress Notes (Signed)
ANTICOAGULATION CONSULT NOTE - Initial Consult  Pharmacy Consult for Warfain Indication: atrial fibrillation  Assessment: RP is a 82yo WM who was admitted on 9/13 for CP, weakness, and SOB. Prior to admission, he was managed on warfarin 2.5mg  daily (5mg  tablets at home) for atrial fibrillation. INR on admission was 2.8, last dose was 6pm on 9/12. Today, INR is 2.61, hemoglobin is slightly low, platelets are normal, and no bleeding noted.   Of note, patient was started on amiodarone on 9/4 (outpatient) for ventricular tachycardia and ICD firing. Interaction between amiodarone and warfarin is delayed, and we will continue to monitor while here. Patient's cardiologist has a follow-up plan for outpatient INR monitoring already.   Goal of Therapy:  INR 2-3 Monitor platelets by anticoagulation protocol: Yes   Plan:  Warfarin 2.5 mg x 1 dose tonight Monitor daily INR, s/sx's of bleeding, and CBC as needed   Allergies  Allergen Reactions  . Carisoprodol     REACTION: rash  . Lisinopril     REACTION: cough  . Tape     Cloth Tape tears skin  . Zolpidem Tartrate Other (See Comments)    Altered mental status  . Penicillins Rash    Has patient had a PCN reaction causing immediate rash, facial/tongue/throat swelling, SOB or lightheadedness with hypotension:YES Has patient had a PCN reaction causing severe rash involving mucus membranes or skin necrosis: NO Has patient had a PCN reaction that required hospitalization NO Has patient had a PCN reaction occurring within the last 10 years: NO If all of the above answers are "NO", then may proceed with Cephalosporin use.    Patient Measurements: Height: 6\' 1"  (185.4 cm) Weight: 185 lb 12.8 oz (84.3 kg) IBW/kg (Calculated) : 79.9   Vital Signs: Temp: 98 F (36.7 C) (09/13 0738) Temp Source: Oral (09/13 0738) BP: 125/55 (09/13 0738) Pulse Rate: 62 (09/13 0738)  Labs:  Recent Labs  10/15/15 0742 10/17/15 0459 10/17/15 0758  HGB  --   12.6*  --   HCT  --  39.3  --   PLT  --  153  --   LABPROT  --   --  28.4*  INR 2.8  --  2.61  CREATININE  --  2.43*  --   TROPONINI  --   --  0.04*    Estimated Creatinine Clearance: 26.5 mL/min (by C-G formula based on SCr of 2.43 mg/dL (H)).   Medical History: Past Medical History:  Diagnosis Date  . Atrial fibrillation ([place]) 04/04/2010  . AV BLOCK 05/22/2008  . CAD, ARTERY BYPASS GRAFT 05/22/2008  . CARDIOMYOPATHY, ISCHEMIC 05/22/2008  . CAROTID ARTERY STENOSIS 11/15/2009  . CHF (congestive heart failure) (HCC)    class 2 to 3  . Chronic systolic heart failure ([place]) 10/13/2008  . COLONIC POLYPS, HX OF 04/07/2008  . Decreased left ventricular function   . DEGENERATIVE JOINT DISEASE, LEFT KNEE 04/07/2008  . DIVERTICULITIS, HX OF 04/07/2008  . GERD 04/07/2008  . HYPERLIPIDEMIA 04/07/2008  . HYPERTENSION 04/07/2008  . MITRAL VALVE PROLAPSE, NON-RHEUMATIC 05/22/2008  . RENAL INSUFFICIENCY 10/17/2008  . Shortness of breath dyspnea   . Ventricular tachycardia ([place])    Status post catheter ablation may 2013    Medications:  Scheduled:  . amiodarone  200 mg Oral Daily  . atorvastatin  20 mg Oral Daily  . carvedilol  25 mg Oral BID WC  . donepezil  10 mg Oral QHS  . fenofibrate  160 mg Oral Daily  . furosemide  40 mg Oral Daily  . isosorbide mononitrate  120 mg Oral Daily  . losartan  50 mg Oral Daily  . pantoprazole  40 mg Oral Daily  . potassium chloride SA  20 mEq Oral BID  . ranolazine  1,000 mg Oral BID  . warfarin  2.5 mg Oral ONCE-1800  . Warfarin - Pharmacist Dosing Inpatient   Does not apply [id number]    Belia Heman, PharmD PGY1 Pharmacy Resident [phone] (Pager) 10/17/2015 9:52 AM

## 2015-10-17 NOTE — ED Notes (Signed)
Report attempted, RN unable to get report at this time. 

## 2015-10-17 NOTE — Care Management Obs Status (Signed)
[place] NOTIFICATION   Patient Details  Name: Sean [name] MRN: [id number] Date of Birth: 1931/10/28   Medicare Observation Status Notification Given:  Yes (observation letter explained, answered patient's questions)    Apolonio Schneiders, RN 10/17/2015, 7:00 PM

## 2015-10-18 ENCOUNTER — Inpatient Hospital Stay (HOSPITAL_COMMUNITY): Payer: Medicare Other

## 2015-10-18 ENCOUNTER — Other Ambulatory Visit (HOSPITAL_COMMUNITY): Payer: Medicare Other

## 2015-10-18 DIAGNOSIS — I472 Ventricular tachycardia: Secondary | ICD-10-CM

## 2015-10-18 DIAGNOSIS — R079 Chest pain, unspecified: Secondary | ICD-10-CM

## 2015-10-18 DIAGNOSIS — R4189 Other symptoms and signs involving cognitive functions and awareness: Secondary | ICD-10-CM

## 2015-10-18 DIAGNOSIS — E785 Hyperlipidemia, unspecified: Secondary | ICD-10-CM

## 2015-10-18 DIAGNOSIS — I10 Essential (primary) hypertension: Secondary | ICD-10-CM

## 2015-10-18 DIAGNOSIS — I5023 Acute on chronic systolic (congestive) heart failure: Secondary | ICD-10-CM

## 2015-10-18 DIAGNOSIS — R55 Syncope and collapse: Secondary | ICD-10-CM

## 2015-10-18 DIAGNOSIS — N184 Chronic kidney disease, stage 4 (severe): Secondary | ICD-10-CM

## 2015-10-18 DIAGNOSIS — Z7901 Long term (current) use of anticoagulants: Secondary | ICD-10-CM

## 2015-10-18 DIAGNOSIS — I480 Paroxysmal atrial fibrillation: Secondary | ICD-10-CM

## 2015-10-18 LAB — COMPREHENSIVE METABOLIC PANEL
ALT: 15 U/L — ABNORMAL LOW (ref 17–63)
AST: 25 U/L (ref 15–41)
Albumin: 3.8 g/dL (ref 3.5–5.0)
Alkaline Phosphatase: 34 U/L — ABNORMAL LOW (ref 38–126)
Anion gap: 9 (ref 5–15)
BUN: 37 mg/dL — ABNORMAL HIGH (ref 6–20)
CHLORIDE: 100 mmol/L — AB (ref 101–111)
CO2: 31 mmol/L (ref 22–32)
CREATININE: 2.52 mg/dL — AB (ref 0.61–1.24)
Calcium: 9.9 mg/dL (ref 8.9–10.3)
GFR, EST AFRICAN AMERICAN: 26 mL/min — AB (ref >60)
GFR, EST NON AFRICAN AMERICAN: 22 mL/min — AB (ref >60)
Glucose, Bld: 103 mg/dL — ABNORMAL HIGH (ref 65–99)
POTASSIUM: 4.2 mmol/L (ref 3.5–5.1)
Sodium: 140 mmol/L (ref 135–145)
Total Bilirubin: 1.3 mg/dL — ABNORMAL HIGH (ref 0.3–1.2)
Total Protein: 6.7 g/dL (ref 6.5–8.1)

## 2015-10-18 LAB — HEMOGLOBIN A1C
HEMOGLOBIN A1C: 5.7 % — AB (ref 4.8–5.6)
MEAN PLASMA GLUCOSE: 117 mg/dL

## 2015-10-18 LAB — CBC
HCT: 39.8 % (ref 39.0–52.0)
Hemoglobin: 12.7 g/dL — ABNORMAL LOW (ref 13.0–17.0)
MCH: 28.5 pg (ref 26.0–34.0)
MCHC: 31.9 g/dL (ref 30.0–36.0)
MCV: 89.4 fL (ref 78.0–100.0)
PLATELETS: 163 10*3/uL (ref 150–400)
RBC: 4.45 MIL/uL (ref 4.22–5.81)
RDW: 15.2 % (ref 11.5–15.5)
WBC: 6.2 10*3/uL (ref 4.0–10.5)

## 2015-10-18 LAB — PROTIME-INR
INR: 2.41
Prothrombin Time: 26.7 seconds — ABNORMAL HIGH (ref 11.4–15.2)

## 2015-10-18 LAB — ECHOCARDIOGRAM COMPLETE
HEIGHTINCHES: 73 in
WEIGHTICAEL: 2873.6 [oz_av]

## 2015-10-18 LAB — LIPID PANEL
CHOL/HDL RATIO: 6.3 ratio
CHOLESTEROL: 183 mg/dL (ref 0–200)
HDL: 29 mg/dL — AB (ref >40)
LDL Cholesterol: 111 mg/dL — ABNORMAL HIGH (ref 0–99)
TRIGLYCERIDES: 214 mg/dL — AB (ref <150)
VLDL: 43 mg/dL — ABNORMAL HIGH (ref 0–40)

## 2015-10-18 LAB — MAGNESIUM: MAGNESIUM: 2.2 mg/dL (ref 1.7–2.4)

## 2015-10-18 MED ORDER — WARFARIN SODIUM 2.5 MG PO TABS
2.5000 mg | ORAL_TABLET | Freq: Once | ORAL | Status: AC
  Administered 2015-10-18: 2.5 mg via ORAL
  Filled 2015-10-18: qty 1

## 2015-10-18 NOTE — Progress Notes (Signed)
  Echocardiogram 2D Echocardiogram has been performed.  Sean [name] 10/18/2015, 3:52 PM

## 2015-10-18 NOTE — Progress Notes (Signed)
ANTICOAGULATION CONSULT NOTE - Follow Up Consult  Pharmacy Consult for Coumadin Indication: atrial fibrillation  Allergies  Allergen Reactions  . Carisoprodol     REACTION: rash  . Lisinopril     REACTION: cough  . Tape     Cloth Tape tears skin  . Zolpidem Tartrate Other (See Comments)    Altered mental status  . Penicillins Rash    Has patient had a PCN reaction causing immediate rash, facial/tongue/throat swelling, SOB or lightheadedness with hypotension:YES Has patient had a PCN reaction causing severe rash involving mucus membranes or skin necrosis: NO Has patient had a PCN reaction that required hospitalization NO Has patient had a PCN reaction occurring within the last 10 years: NO If all of the above answers are "NO", then may proceed with Cephalosporin use.    Patient Measurements: Height: 6\' 1"  (185.4 cm) Weight: 179 lb 9.6 oz (81.5 kg) IBW/kg (Calculated) : 79.9  Vital Signs: Temp: 98.5 F (36.9 C) (09/14 0811) Temp Source: Oral (09/14 0811) BP: 121/56 (09/14 0811) Pulse Rate: 66 (09/14 0811)  Labs:  Recent Labs  10/17/15 0459 10/17/15 0758 10/17/15 1042 10/17/15 1349 10/18/15 0258  HGB 12.6*  --   --   --  12.7*  HCT 39.3  --   --   --  39.8  PLT 153  --   --   --  163  LABPROT  --  28.4*  --   --  26.7*  INR  --  2.61  --   --  2.41  CREATININE 2.43*  --   --   --  2.52*  TROPONINI  --  0.04* 0.04* 0.04*  --     Estimated Creatinine Clearance: 25.5 mL/min (by C-G formula based on SCr of 2.52 mg/dL (H)).   Assessment: 84yom continues on coumadin for afib. INR therapeutic at 2.41 on home dose. CBC stable. No bleeding. Continues on po amiodarone as pta.  Home dose: 2.5mg  daily - last dose 9/12  Goal of Therapy:  INR 2-3 Monitor platelets by anticoagulation protocol: Yes   Plan:  1) Coumadin 2.5mg  x 1 2) Daily INR  Deboraha Sprang 10/18/2015,11:36 AM

## 2015-10-18 NOTE — Progress Notes (Signed)
Advanced Heart Failure Rounding Note   Subjective:   Admitted with volume overload, increased dyspnea, and chest pain. Diuresing with IV lasix. Amiodarone cut back to 200 mg daily due to diarrhea.   Feeling better this morning. Breathing much better and sleeping comfortably in hospital bed. Denies CP/orthopnea/PND.    Objective:   Weight Range: 179 - 180 lbs  Vital Signs:   Temp:  [97.7 F (36.5 C)-98.3 F (36.8 C)] 98 F (36.7 C) (09/14 0400) Pulse Rate:  [59-63] 63 (09/14 0400) Resp:  [21-23] 21 (09/14 0400) BP: (106-121)/(56-74) 106/74 (09/14 0400) SpO2:  [95 %-99 %] 99 % (09/14 0400) Weight:  [179 lb 9.6 oz (81.5 kg)] 179 lb 9.6 oz (81.5 kg) (09/14 0400) Last BM Date: 10/17/15  Weight change: Filed Weights   10/17/15 0449 10/17/15 0738 10/18/15 0400  Weight: 180 lb (81.6 kg) 185 lb 12.8 oz (84.3 kg) 179 lb 9.6 oz (81.5 kg)    Intake/Output:   Intake/Output Summary (Last 24 hours) at 10/18/15 0814 Last data filed at 10/18/15 0400  Gross per 24 hour  Intake           888.33 ml  Output             3300 ml  Net         -2411.67 ml     Physical Exam: General:  Well appearing. No resp difficulty sitting up in bed.  HEENT: normal Neck: supple. JVP ~8 -9. Carotids 2+ bilat; no bruits. No lymphadenopathy or thryomegaly appreciated. Cor: PMI nondisplaced. Regular rate & rhythm. No S3, rubs, gallops. 3/6 HSM. Lungs: clear bilaterally Abdomen: soft, nontender, nondistended. No hepatosplenomegaly. No bruits or masses. Good bowel sounds.  Extremities: no cyanosis, clubbing, rash, edema Neuro: alert & oriented x 3, cranial nerves grossly intact. moves all 4 extremities w/o difficulty. Affect pleasant  Telemetry: V paced 60s  Labs: Basic Metabolic Panel:  Recent Labs Lab 10/17/15 0459 10/18/15 0258  NA 138 140  K 4.3 4.2  CL 98* 100*  CO2 28 31  GLUCOSE 111* 103*  BUN 31* 37*  CREATININE 2.43* 2.52*  CALCIUM 9.6 9.9    Liver Function Tests:  Recent  Labs Lab 10/18/15 0258  AST 25  ALT 15*  ALKPHOS 34*  BILITOT 1.3*  PROT 6.7  ALBUMIN 3.8   No results for input(s): LIPASE, AMYLASE in the last 168 hours. No results for input(s): AMMONIA in the last 168 hours.  CBC:  Recent Labs Lab 10/17/15 0459 10/18/15 0258  WBC 6.3 6.2  HGB 12.6* 12.7*  HCT 39.3 39.8  MCV 89.3 89.4  PLT 153 163    Cardiac Enzymes:  Recent Labs Lab 10/17/15 0758 10/17/15 1042 10/17/15 1349  TROPONINI 0.04* 0.04* 0.04*    BNP: BNP (last 3 results)  Recent Labs  03/15/15 0548 10/17/15 1349  BNP 441.9* 591.8*    ProBNP (last 3 results) No results for input(s): PROBNP in the last 8760 hours.    Other results:  Imaging: Dg Chest 2 View  Result Date: 10/17/2015 CLINICAL DATA:  Midchest pain tonight radiating down the arms. EXAM: CHEST  2 VIEW COMPARISON:  03/19/2015 FINDINGS: Unchanged borderline cardiomegaly. The lungs are clear except for minor linear scarring or atelectasis. No confluent airspace consolidation. No effusions. Normal pulmonary vasculature. Intact appearances of the biventricular transvenous cardiac leads. Prior sternotomy and CABG. IMPRESSION: Unchanged borderline cardiomegaly. No acute cardiopulmonary findings. Electronically Signed   By: Andreas Newport M.D.   On: 10/17/2015 05:31  Medications:     Scheduled Medications: . amiodarone  200 mg Oral Daily  . atorvastatin  20 mg Oral Daily  . carvedilol  25 mg Oral BID WC  . donepezil  10 mg Oral QHS  . fenofibrate  160 mg Oral Daily  . furosemide  60 mg Intravenous BID  . isosorbide mononitrate  120 mg Oral Daily  . losartan  50 mg Oral Daily  . pantoprazole  40 mg Oral Daily  . potassium chloride SA  20 mEq Oral BID  . ranolazine  1,000 mg Oral BID  . Warfarin - Pharmacist Dosing Inpatient   Does not apply q1800     Infusions:   NONE  PRN Medications:  acetaminophen, gi cocktail, promethazine   Assessment/Plan/Discussion  Admitted with  increased dyspnea and chest pain.  Also has has some nausea (pre-dated amiodarone) and diarrhea (since amiodarone).  Device interrogated, suggestive of some fluid overload but no further VT/VF.  He is in NSR.  His angina is unchanged at his baseline.   1. CAD: s/p CABG. Chronic stable angina, unchanged currently.  Symptoms appear to be due to volume overload than angina. - Continue ranolazine, Imdur, ARB, statin, and Coreg.  - He does not need aspirin as he has stable CAD and is on warfarin. Pharmacy dosing coumadin.  2. Acute on chronic systolic CHF: Ischemic cardiomyopathy. EF 30-35% on last echo. He has a [employer] CRT-D device.   - Volume status improving. Continue Lasix 60 mg IV bid. Dry weight will be lowered. Anticipate transitioning to lasix 80 mg bid. Watch renal function closely.    - Continue current losartan as long as creatinine remains stable.   - Continue current Coreg (at goal dose).  - Echo pending.   3. Mitral regurgitation: Moderate on last echo, murmur is loud.  Getting repeat echo.  4. Paroxysmal atrial fibrillation: Patient is on warfarin. He is in NSR today.  5. CKD: Stable recently, sees nephrology. Follow closely with diuresis.  6. VT: Prior VT ablation. VT on 9/4 with ICD discharge.  Amiodarone started.  He has not had further VT on ICD interrogation.  - Continue amiodarone at reduced dose 200 mg daily as it may have caused some loose stool. LFTs ok.  K 4.2. Check Mag.    Consult cardiac rehab.   Length of Stay: 1  Amy Clegg NP-C  10/18/2015, 8:14 AM  Advanced Heart Failure Team Pager [phone] (M-F; 7a - 4p)  Please contact [facility] for night-coverage after hours (4p -7a ) and weekends on [email or web address]  Patient seen with NP, agree with the above note.  He is feeling better with diuresis yesterday, weight down. Would continue 1 more day IV Lasix, then back to po (likely Lasix 80 mg po bid).  LDL high, will increase atorvastatin to 40 mg daily.    [name] 10/18/2015 12:23 PM

## 2015-10-18 NOTE — Progress Notes (Signed)
PROGRESS NOTE  Sean [name] [id number] DOB: 1931/06/02 DOA: 10/17/2015 PCP: Eulas Post, MD  Brief History:  82 year old male with a history of coronary artery disease, ischemic cardiomyopathy, ventricular tachycardia status post ablation and ICD, renal artery stenosis, CKD stage IV, PAF presents with one-month history of dyspnea on exertion. In the past 2 weeks, the patient has had exertional chest discomfort and worsening dyspnea on exertion. On 10/09/2015, the patient had a syncopal episode while working in the yard. He saw his cardiologist in the office on 10/10/2015. His AICD was interrogated and the patient was noted to have ventricular tachycardia, and the patient was given a shock. The patient was started on amiodarone. Unfortunately, the patient continued to have presyncopal episodes and dyspnea on exertion. Upon presentation, the patient was noted to be afebrile and hemodynamically stable.  The patient was also noted to be on the hypervolemic side, and he was started on intravenous furosemide. Cardiology was consulted to assist with management.   Assessment/Plan: Acute on chronic systolic CHF (HFrEF ) -[id number] echo EF 30-35%, diffuse HK, moderate MR  -Continue intravenous furosemide  --daily weights -accurate I/O's -fluid restrict -Echo -continue IV Lasix -NEG  2.4 L for the admission -repeat echo -appreciate cardiology consult -Continue carvedilol, Imdur, losartan  Angina/CAD -Patient's symptoms are consistent with stable angina -Appreciate cardiology follow-up -Continue Imdur, carvedilol, Ranexa  Paroxsymal Atrial fibrillation -presently in sinus -Continue warfarin -CHADSVASc = 5  Ventricular tachycardia/syncope -History of VT ablation -VT on 9/4 with ICD discharge  CKD stage IV -Baseline creatinine 2.0-2.4 -Monitor with diuresis  Impaired glucose tolerance -10/17/2015 hemoglobin A1c 5.7  Hypertension -Continue carvedilol, losartan,  Imdur  Hyperlipidemia -Continue fenofibrate and atorvastatin   Disposition Plan:   Home in 1-2 days when cleared by cardiology Family Communication:   Wife updated at bedside--Total time spent 35 minutes.  Greater than 50% spent face to face counseling and coordinating care.   Consultants:  cardiology  Code Status:  FULL   DVT Prophylaxis:  Coumadin   Procedures: As Listed in Progress Note Above  Antibiotics: None    Subjective: Patient denies fevers, chills, headache, chest pain, dyspnea, nausea, vomiting, diarrhea, abdominal pain, dysuria, hematuria, hematochezia, and melena.  He still has some dyspnea on exertion   Objective: Vitals:   10/17/15 0738 10/17/15 2151 10/18/15 0011 10/18/15 0400  BP: (!) 125/55 (!) 117/57 (!) 121/56 106/74  Pulse: 62  (!) 59 63  Resp: 20 (!) 22 (!) 23 (!) 21  Temp: 98 F (36.7 C) 98.3 F (36.8 C) 97.7 F (36.5 C) 98 F (36.7 C)  TempSrc: Oral Oral Oral Oral  SpO2: 98% 97% 95% 99%  Weight: 84.3 kg (185 lb 12.8 oz)   81.5 kg (179 lb 9.6 oz)  Height: 6\' 1"  (1.854 m)       Intake/Output Summary (Last 24 hours) at 10/18/15 0754 Last data filed at 10/18/15 0400  Gross per 24 hour  Intake           888.33 ml  Output             3300 ml  Net         -2411.67 ml   Weight change: 2.631 kg (5 lb 12.8 oz) Exam:   General:  Pt is alert, follows commands appropriately, not in acute distress  HEENT: No icterus, No thrush, No neck mass, [place]/AT  Cardiovascular: RRR, S1/S2, no rubs, no gallops  Respiratory: Fine bibasilar crackles. No  wheezing. Good air movement.   Abdomen: Soft/+BS, non tender, non distended, no guarding  Extremities: No edema, No lymphangitis, No petechiae, No rashes, no synovitis   Data Reviewed: I have personally reviewed following labs and imaging studies Basic Metabolic Panel:  Recent Labs Lab 10/17/15 0459 10/18/15 0258  NA 138 140  K 4.3 4.2  CL 98* 100*  CO2 28 31  GLUCOSE 111* 103*  BUN 31* 37*    CREATININE 2.43* 2.52*  CALCIUM 9.6 9.9   Liver Function Tests:  Recent Labs Lab 10/18/15 0258  AST 25  ALT 15*  ALKPHOS 34*  BILITOT 1.3*  PROT 6.7  ALBUMIN 3.8   No results for input(s): LIPASE, AMYLASE in the last 168 hours. No results for input(s): AMMONIA in the last 168 hours. Coagulation Profile:  Recent Labs Lab 10/15/15 0742 10/17/15 0758 10/18/15 0258  INR 2.8 2.61 2.41   CBC:  Recent Labs Lab 10/17/15 0459 10/18/15 0258  WBC 6.3 6.2  HGB 12.6* 12.7*  HCT 39.3 39.8  MCV 89.3 89.4  PLT 153 163   Cardiac Enzymes:  Recent Labs Lab 10/17/15 0758 10/17/15 1042 10/17/15 1349  TROPONINI 0.04* 0.04* 0.04*   BNP: Invalid input(s): POCBNP CBG: No results for input(s): GLUCAP in the last 168 hours. HbA1C:  Recent Labs  10/17/15 0758  HGBA1C 5.7*   Urine analysis:    Component Value Date/Time   COLORURINE YELLOW 10/17/2015 0854   APPEARANCEUR CLEAR 10/17/2015 0854   LABSPEC 1.012 10/17/2015 0854   PHURINE 6.0 10/17/2015 0854   GLUCOSEU NEGATIVE 10/17/2015 0854   HGBUR NEGATIVE 10/17/2015 0854   BILIRUBINUR NEGATIVE 10/17/2015 0854   BILIRUBINUR n 04/03/2011 1113   KETONESUR NEGATIVE 10/17/2015 0854   PROTEINUR NEGATIVE 10/17/2015 0854   UROBILINOGEN 1.0 04/03/2011 1113   UROBILINOGEN 1.0 01/21/2009 0947   NITRITE NEGATIVE 10/17/2015 0854   LEUKOCYTESUR NEGATIVE 10/17/2015 0854   Sepsis Labs: @LABRCNTIP (procalcitonin:4,lacticidven:4) )No results found for this or any previous visit (from the past 240 hour(s)).   Scheduled Meds: . amiodarone  200 mg Oral Daily  . atorvastatin  20 mg Oral Daily  . carvedilol  25 mg Oral BID WC  . donepezil  10 mg Oral QHS  . fenofibrate  160 mg Oral Daily  . furosemide  60 mg Intravenous BID  . isosorbide mononitrate  120 mg Oral Daily  . losartan  50 mg Oral Daily  . pantoprazole  40 mg Oral Daily  . potassium chloride SA  20 mEq Oral BID  . ranolazine  1,000 mg Oral BID  . Warfarin -  Pharmacist Dosing Inpatient   Does not apply q1800   Continuous Infusions:   Procedures/Studies: Dg Chest 2 View  Result Date: 10/17/2015 CLINICAL DATA:  Midchest pain tonight radiating down the arms. EXAM: CHEST  2 VIEW COMPARISON:  03/19/2015 FINDINGS: Unchanged borderline cardiomegaly. The lungs are clear except for minor linear scarring or atelectasis. No confluent airspace consolidation. No effusions. Normal pulmonary vasculature. Intact appearances of the biventricular transvenous cardiac leads. Prior sternotomy and CABG. IMPRESSION: Unchanged borderline cardiomegaly. No acute cardiopulmonary findings. Electronically Signed   By: Andreas Newport M.D.   On: 10/17/2015 05:31    Alvira Hecht, DO  [facility] Pager [phone]  If 7PM-7AM, please contact night-coverage [email or web address] Password [email or web address] 10/18/2015, 7:54 AM   LOS: 1 day

## 2015-10-19 DIAGNOSIS — I459 Conduction disorder, unspecified: Secondary | ICD-10-CM

## 2015-10-19 DIAGNOSIS — Z95810 Presence of automatic (implantable) cardiac defibrillator: Secondary | ICD-10-CM

## 2015-10-19 LAB — BASIC METABOLIC PANEL
ANION GAP: 11 (ref 5–15)
BUN: 37 mg/dL — ABNORMAL HIGH (ref 6–20)
CALCIUM: 10 mg/dL (ref 8.9–10.3)
CHLORIDE: 98 mmol/L — AB (ref 101–111)
CO2: 29 mmol/L (ref 22–32)
Creatinine, Ser: 2.43 mg/dL — ABNORMAL HIGH (ref 0.61–1.24)
GFR calc non Af Amer: 23 mL/min — ABNORMAL LOW (ref >60)
GFR, EST AFRICAN AMERICAN: 27 mL/min — AB (ref >60)
Glucose, Bld: 112 mg/dL — ABNORMAL HIGH (ref 65–99)
Potassium: 4.1 mmol/L (ref 3.5–5.1)
Sodium: 138 mmol/L (ref 135–145)

## 2015-10-19 LAB — PROTIME-INR
INR: 2.49
Prothrombin Time: 27.4 seconds — ABNORMAL HIGH (ref 11.4–15.2)

## 2015-10-19 LAB — MAGNESIUM: MAGNESIUM: 2.2 mg/dL (ref 1.7–2.4)

## 2015-10-19 MED ORDER — FUROSEMIDE 80 MG PO TABS
80.0000 mg | ORAL_TABLET | Freq: Two times a day (BID) | ORAL | Status: DC
  Administered 2015-10-19: 80 mg via ORAL
  Filled 2015-10-19: qty 1

## 2015-10-19 MED ORDER — WARFARIN SODIUM 2.5 MG PO TABS: 2.5000 mg | ORAL_TABLET | Freq: Once | ORAL | Status: DC

## 2015-10-19 MED ORDER — FUROSEMIDE 40 MG PO TABS: 80.0000 mg | ORAL_TABLET | Freq: Two times a day (BID) | ORAL | 0 refills | Status: DC

## 2015-10-19 NOTE — Progress Notes (Signed)
Advanced Heart Failure Rounding Note   Subjective:   Admitted with volume overload, increased dyspnea, and chest pain. Diuresing with IV lasix. Amiodarone cut back to 200 mg daily due to diarrhea.   Denies SOB   Objective:   Weight Range: 185>178 pounds   Vital Signs:   Temp:  [97.7 F (36.5 C)-98.5 F (36.9 C)] 98 F (36.7 C) (09/15 0416) Pulse Rate:  [59-69] 63 (09/15 0416) Resp:  [18-22] 18 (09/15 0416) BP: (103-131)/(52-62) 131/59 (09/15 0416) SpO2:  [93 %-98 %] 93 % (09/15 0416) Weight:  [81 kg (178 lb 9.6 oz)] 81 kg (178 lb 9.6 oz) (09/15 0416) Last BM Date: 10/18/15  Weight change: Filed Weights   10/17/15 0738 10/18/15 0400 10/19/15 0416  Weight: 84.3 kg (185 lb 12.8 oz) 81.5 kg (179 lb 9.6 oz) 81 kg (178 lb 9.6 oz)    Intake/Output:   Intake/Output Summary (Last 24 hours) at 10/19/15 0755 Last data filed at 10/19/15 0000  Gross per 24 hour  Intake              720 ml  Output              881 ml  Net             -161 ml     Physical Exam: General:  Well appearing. No resp difficulty sitting up in bed.  HEENT: normal Neck: supple. JVP ~6-7. Carotids 2+ bilat; no bruits. No lymphadenopathy or thryomegaly appreciated. Cor: PMI nondisplaced. Regular rate & rhythm. No S3, rubs, gallops. 3/6 HSM. Lungs: clear bilaterally Abdomen: soft, nontender, nondistended. No hepatosplenomegaly. No bruits or masses. Good bowel sounds.  Extremities: no cyanosis, clubbing, rash, edema Neuro: alert & oriented x 3, cranial nerves grossly intact. moves all 4 extremities w/o difficulty. Affect pleasant  Telemetry: V paced 60s  Labs: Basic Metabolic Panel:  Recent Labs Lab 10/17/15 0459 10/18/15 0258 10/18/15 1003 10/19/15 0439  NA 138 140  --  138  K 4.3 4.2  --  4.1  CL 98* 100*  --  98*  CO2 28 31  --  29  GLUCOSE 111* 103*  --  112*  BUN 31* 37*  --  37*  CREATININE 2.43* 2.52*  --  2.43*  CALCIUM 9.6 9.9  --  10.0  MG  --   --  2.2 2.2    Liver  Function Tests:  Recent Labs Lab 10/18/15 0258  AST 25  ALT 15*  ALKPHOS 34*  BILITOT 1.3*  PROT 6.7  ALBUMIN 3.8   No results for input(s): LIPASE, AMYLASE in the last 168 hours. No results for input(s): AMMONIA in the last 168 hours.  CBC:  Recent Labs Lab 10/17/15 0459 10/18/15 0258  WBC 6.3 6.2  HGB 12.6* 12.7*  HCT 39.3 39.8  MCV 89.3 89.4  PLT 153 163    Cardiac Enzymes:  Recent Labs Lab 10/17/15 0758 10/17/15 1042 10/17/15 1349  TROPONINI 0.04* 0.04* 0.04*    BNP: BNP (last 3 results)  Recent Labs  03/15/15 0548 10/17/15 1349  BNP 441.9* 591.8*    ProBNP (last 3 results) No results for input(s): PROBNP in the last 8760 hours.    Other results:  Imaging: No results found.   Medications:     Scheduled Medications: . amiodarone  200 mg Oral Daily  . atorvastatin  20 mg Oral Daily  . carvedilol  25 mg Oral BID WC  . donepezil  10 mg Oral QHS  .  fenofibrate  160 mg Oral Daily  . furosemide  60 mg Intravenous BID  . isosorbide mononitrate  120 mg Oral Daily  . losartan  50 mg Oral Daily  . pantoprazole  40 mg Oral Daily  . potassium chloride SA  20 mEq Oral BID  . ranolazine  1,000 mg Oral BID  . Warfarin - Pharmacist Dosing Inpatient   Does not apply q1800    Infusions:   NONE  PRN Medications: acetaminophen, gi cocktail, promethazine   Assessment/Plan/Discussion  Admitted with increased dyspnea and chest pain.  Also has has some nausea (pre-dated amiodarone) and diarrhea (since amiodarone).  Device interrogated, suggestive of some fluid overload but no further VT/VF.  He is in NSR.  His angina is unchanged at his baseline.   1. CAD: s/p CABG. Chronic stable angina, unchanged currently.  Symptoms appear to be due to volume overload than angina. - Continue ranolazine, Imdur, ARB, statin, and Coreg. Statin increased.  - He does not need aspirin as he has stable CAD and is on warfarin. Pharmacy dosing coumadin.  2. Acute on  chronic systolic CHF: Ischemic cardiomyopathy. EF 25-30%. He has a [employer] CRT-D device.   - Volume status improved. Stop IV lasix. Transitioning to po lasix 80 mg bid. Renal function stable.     - Continue current losartan as long as creatinine remains stable.   - Continue current Coreg (at goal dose).  - Echo 25-30% Grade II DD   3. Mitral regurgitation: Moderate on last echo, murmur is loud.  Getting repeat echo.  4. Paroxysmal atrial fibrillation: Patient is on warfarin. He is in NSR today.  5. CKD: Stable recently, sees nephrology. Follow closely with diuresis.  6. VT: Prior VT ablation. VT on 9/4 wi th ICD discharge.  Amiodarone started.  He has not had further VT on ICD interrogation.  - Continue amiodarone at reduced dose 200 mg daily as it may have caused some loose stool. LFTs ok.  K 4.1. Mag 2.2     Length of Stay: 2  Amy Clegg NP-C  10/19/2015, 7:55 AM  Advanced Heart Failure Team Pager [phone] (M-F; 7a - 4p)  Please contact [facility] for night-coverage after hours (4p -7a ) and weekends on [email or web address]  Patient seen with NP, agree with the above note.  He feels better with diuresis.  Looks euvolemic now.  Echo showed stable EF 25-30% but now has severe mitral regurgitation.  He has followup with me next week.  Would increase Lasix to 80 mg bid for home and will take amiodarone 200 mg daily going home.   [name] 10/19/2015 1:20 PM

## 2015-10-19 NOTE — Progress Notes (Signed)
Pt discharged in stable condition with wife to private vehicle.

## 2015-10-19 NOTE — Discharge Summary (Signed)
Physician Discharge Summary  Sean [name] [id number] DOB: Jun 29, 1931 DOA: 10/17/2015  PCP: Eulas Post, MD  Admit date: 10/17/2015 Discharge date: 10/19/2015  Admitted From: Home Disposition:  Home  Recommendations for Outpatient Follow-up:  1. Follow up with PCP in 1-2 weeks 2. Please obtain BMP in one week 3. Please keep appointment at coumadin clinic on 10/22/15   Home Health: No Equipment/Devices:  Discharge Condition: Stable CODE STATUS:FULL Diet recommendation: Heart Healthy   Brief/Interim Summary: 82 year old male with a history of coronary artery disease, ischemic cardiomyopathy, ventricular tachycardia status post ablation and ICD, renal artery stenosis, CKD stage IV, PAF presents with one-month history of dyspnea on exertion. In the past 2 weeks, the patient has had exertional chest discomfort and worsening dyspnea on exertion. On 10/09/2015, the patient had a syncopal episode while working in the yard. He saw his cardiologist in the office on 10/10/2015. His AICD was interrogated and the patient was noted to have ventricular tachycardia, and the patient was given a shock. The patient was started on amiodarone. Unfortunately, the patient continued to have presyncopal episodes and dyspnea on exertion. Upon presentation, the patient was noted to be afebrile and hemodynamically stable.  The patient was also noted to be on the hypervolemic side, and he was started on intravenous furosemide. Cardiology was consulted to assist with management.   Discharge Diagnoses:  Acute on chronic systolic CHF (HFrEF ) -[id number] echo EF 30-35%, diffuse HK, moderate MR  --daily weights -fluid restrict -10/18/15--Echo-EF 25-30%. 2 DD, moderate AS, severe MR with malcoaptation of leaflets -continue IV Lasix 60 mg bid-->home with lasix 80 mg po bid -NEG  3.1 L for the admission -discharge weight 178 lbs -appreciate cardiology consult -Continue carvedilol, Imdur,  losartan  Angina/CAD -Patient's symptoms are consistent with stable angina -Appreciate cardiology follow-up -Continue Imdur, carvedilol, Ranexa  Paroxsymal Atrial fibrillation -presently in sinus -Continue warfarin -CHADSVASc = 5 -INR 2.49 at time of d/c  Ventricular tachycardia/syncope -History of VT ablation -VT on 9/4 with ICD discharge -continue amiodarone--LFTs ok  CKD stage IV -Baseline creatinine 2.0-2.4 -Monitor with diuresis -serum creatinine 2.43 on day of discharge  Impaired glucose tolerance -10/17/2015 hemoglobin A1c 5.7  Hypertension -Continue carvedilol, losartan, Imdur   Hyperlipidemia -Continue fenofibrate and atorvastatin    Discharge Instructions  Discharge Instructions    Diet - low sodium heart healthy    Complete by:  As directed    Increase activity slowly    Complete by:  As directed        Medication List    TAKE these medications   amiodarone 200 MG tablet Commonly known as:  PACERONE Take 200mg  twice daily for 10 days. Then decrease to 200 mg daily.   atorvastatin 20 MG tablet Commonly known as:  LIPITOR TAKE 1 TABLET BY MOUTH EVERY DAY   carvedilol 25 MG tablet Commonly known as:  COREG TAKE 1 TABLET BY MOUTH TWICE DAILY   donepezil 10 MG tablet Commonly known as:  ARICEPT Take 1 tablet (10 mg total) by mouth at bedtime.   fenofibrate 160 MG tablet Take 1 tablet (160 mg total) by mouth daily.   furosemide 40 MG tablet Commonly known as:  LASIX Take 2 tablets (80 mg total) by mouth 2 (two) times daily. What changed:  See the new instructions.   isosorbide mononitrate 120 MG 24 hr tablet Commonly known as:  IMDUR Take 1 tablet (120 mg total) by mouth daily.   losartan 50 MG tablet Commonly known as:  COZAAR Take  1 tablet (50 mg total) by mouth daily.   nitroGLYCERIN 0.4 MG SL tablet Commonly known as:  NITROSTAT PLACE 1 TABLET UNDER THE TONGUE EVERY 5 MINUTES AS NEEDED FOR CHEST PAIN   omeprazole 20  MG capsule Commonly known as:  PRILOSEC TAKE ONE CAPSULE BY MOUTH TWICE DAILY   potassium chloride SA 20 MEQ tablet Commonly known as:  K-DUR,KLOR-CON TAKE 1 TABLET BY MOUTH TWICE DAILY   ranolazine 1000 MG SR tablet Commonly known as:  RANEXA Take 1 tablet (1,000 mg total) by mouth 2 (two) times daily.   warfarin 5 MG tablet Commonly known as:  COUMADIN Take 2.5 mg by mouth daily. What changed:  Another medication with the same name was removed. Continue taking this medication, and follow the directions you see here.      Follow-up Information    [name], MD Follow up on 11/01/2015.   Specialty:  Cardiology Why:  at 10:30 Contact information: [place] [address] [phone]          Allergies  Allergen Reactions  . Carisoprodol     REACTION: rash  . Lisinopril     REACTION: cough  . Tape     Cloth Tape tears skin  . Zolpidem Tartrate Other (See Comments)    Altered mental status  . Penicillins Rash    Has patient had a PCN reaction causing immediate rash, facial/tongue/throat swelling, SOB or lightheadedness with hypotension:YES Has patient had a PCN reaction causing severe rash involving mucus membranes or skin necrosis: NO Has patient had a PCN reaction that required hospitalization NO Has patient had a PCN reaction occurring within the last 10 years: NO If all of the above answers are "NO", then may proceed with Cephalosporin use.    Consultations:  Cardiology   Procedures/Studies: Dg Chest 2 View  Result Date: 10/17/2015 CLINICAL DATA:  Midchest pain tonight radiating down the arms. EXAM: CHEST  2 VIEW COMPARISON:  03/19/2015 FINDINGS: Unchanged borderline cardiomegaly. The lungs are clear except for minor linear scarring or atelectasis. No confluent airspace consolidation. No effusions. Normal pulmonary vasculature. Intact appearances of the biventricular transvenous cardiac leads. Prior sternotomy and CABG.  IMPRESSION: Unchanged borderline cardiomegaly. No acute cardiopulmonary findings. Electronically Signed   By: Andreas Newport M.D.   On: 10/17/2015 05:31        Discharge Exam: Vitals:   10/19/15 0416 10/19/15 0740  BP: (!) 131/59 (!) 149/69  Pulse: 63   Resp: 18 (!) 22  Temp: 98 F (36.7 C)    Vitals:   10/18/15 1556 10/18/15 2039 10/19/15 0416 10/19/15 0740  BP: (!) 110/59 (!) 103/52 (!) 131/59 (!) 149/69  Pulse: (!) 59 65 63   Resp:  18 18 (!) 22  Temp: 98.2 F (36.8 C) 97.7 F (36.5 C) 98 F (36.7 C)   TempSrc: Oral Oral Oral   SpO2: 96% 95% 93%   Weight:   81 kg (178 lb 9.6 oz)   Height:        General: Pt is alert, awake, not in acute distress Cardiovascular: RRR, S1/S2 +, no rubs, no gallops Respiratory: CTA bilaterally, no wheezing, no rhonchi Abdominal: Soft, NT, ND, bowel sounds + Extremities: no edema, no cyanosis   The results of significant diagnostics from this hospitalization (including imaging, microbiology, ancillary and laboratory) are listed below for reference.    Significant Diagnostic Studies: Dg Chest 2 View  Result Date: 10/17/2015 CLINICAL DATA:  Midchest pain tonight radiating down  the arms. EXAM: CHEST  2 VIEW COMPARISON:  03/19/2015 FINDINGS: Unchanged borderline cardiomegaly. The lungs are clear except for minor linear scarring or atelectasis. No confluent airspace consolidation. No effusions. Normal pulmonary vasculature. Intact appearances of the biventricular transvenous cardiac leads. Prior sternotomy and CABG. IMPRESSION: Unchanged borderline cardiomegaly. No acute cardiopulmonary findings. Electronically Signed   By: Andreas Newport M.D.   On: 10/17/2015 05:31     Microbiology: No results found for this or any previous visit (from the past 240 hour(s)).   Labs: Basic Metabolic Panel:  Recent Labs Lab 10/17/15 0459 10/18/15 0258 10/18/15 1003 10/19/15 0439  NA 138 140  --  138  K 4.3 4.2  --  4.1  CL 98* 100*  --  98*   CO2 28 31  --  29  GLUCOSE 111* 103*  --  112*  BUN 31* 37*  --  37*  CREATININE 2.43* 2.52*  --  2.43*  CALCIUM 9.6 9.9  --  10.0  MG  --   --  2.2 2.2   Liver Function Tests:  Recent Labs Lab 10/18/15 0258  AST 25  ALT 15*  ALKPHOS 34*  BILITOT 1.3*  PROT 6.7  ALBUMIN 3.8   No results for input(s): LIPASE, AMYLASE in the last 168 hours. No results for input(s): AMMONIA in the last 168 hours. CBC:  Recent Labs Lab 10/17/15 0459 10/18/15 0258  WBC 6.3 6.2  HGB 12.6* 12.7*  HCT 39.3 39.8  MCV 89.3 89.4  PLT 153 163   Cardiac Enzymes:  Recent Labs Lab 10/17/15 0758 10/17/15 1042 10/17/15 1349  TROPONINI 0.04* 0.04* 0.04*   BNP: Invalid input(s): POCBNP CBG: No results for input(s): GLUCAP in the last 168 hours.  Time coordinating discharge:  Greater than 30 minutes  Signed:  Gala Padovano, DO [facility] Pager: [phone] 10/19/2015, 1:26 PM

## 2015-10-19 NOTE — Progress Notes (Signed)
ANTICOAGULATION CONSULT NOTE - Follow Up Consult  Pharmacy Consult for Coumadin Indication: atrial fibrillation  Allergies  Allergen Reactions  . Carisoprodol     REACTION: rash  . Lisinopril     REACTION: cough  . Tape     Cloth Tape tears skin  . Zolpidem Tartrate Other (See Comments)    Altered mental status  . Penicillins Rash    Has patient had a PCN reaction causing immediate rash, facial/tongue/throat swelling, SOB or lightheadedness with hypotension:YES Has patient had a PCN reaction causing severe rash involving mucus membranes or skin necrosis: NO Has patient had a PCN reaction that required hospitalization NO Has patient had a PCN reaction occurring within the last 10 years: NO If all of the above answers are "NO", then may proceed with Cephalosporin use.    Patient Measurements: Height: 6\' 1"  (185.4 cm) Weight: 178 lb 9.6 oz (81 kg) IBW/kg (Calculated) : 79.9  Vital Signs: Temp: 98 F (36.7 C) (09/15 0416) Temp Source: Oral (09/15 0416) BP: 149/69 (09/15 0740) Pulse Rate: 63 (09/15 0416)  Labs:  Recent Labs  10/17/15 0459 10/17/15 0758 10/17/15 1042 10/17/15 1349 10/18/15 0258 10/19/15 0439  HGB 12.6*  --   --   --  12.7*  --   HCT 39.3  --   --   --  39.8  --   PLT 153  --   --   --  163  --   LABPROT  --  28.4*  --   --  26.7* 27.4*  INR  --  2.61  --   --  2.41 2.49  CREATININE 2.43*  --   --   --  2.52* 2.43*  TROPONINI  --  0.04* 0.04* 0.04*  --   --     Estimated Creatinine Clearance: 26.5 mL/min (by C-G formula based on SCr of 2.43 mg/dL (H)).   Assessment: 84yom continues on coumadin for afib. INR therapeutic at 2.49 on home dose. No CBC. No bleeding. Continues on po amiodarone as pta.  Home dose: 2.5mg  daily - last dose 9/12  Goal of Therapy:  INR 2-3 Monitor platelets by anticoagulation protocol: Yes   Plan:  1) Coumadin 2.5mg  x 1 2) Daily INR  Deboraha Sprang 10/19/2015,11:20 AM

## 2015-10-21 ENCOUNTER — Other Ambulatory Visit: Payer: Self-pay | Admitting: Family Medicine

## 2015-10-22 ENCOUNTER — Encounter: Payer: Self-pay | Admitting: Family Medicine

## 2015-10-22 ENCOUNTER — Ambulatory Visit (INDEPENDENT_AMBULATORY_CARE_PROVIDER_SITE_OTHER): Payer: Medicare Other | Admitting: Family Medicine

## 2015-10-22 VITALS — BP 100/50 | HR 63 | Temp 97.8°F | Ht 73.0 in | Wt 185.9 lb

## 2015-10-22 DIAGNOSIS — I10 Essential (primary) hypertension: Secondary | ICD-10-CM

## 2015-10-22 DIAGNOSIS — N184 Chronic kidney disease, stage 4 (severe): Secondary | ICD-10-CM

## 2015-10-22 DIAGNOSIS — I5042 Chronic combined systolic (congestive) and diastolic (congestive) heart failure: Secondary | ICD-10-CM | POA: Diagnosis not present

## 2015-10-22 DIAGNOSIS — I255 Ischemic cardiomyopathy: Secondary | ICD-10-CM

## 2015-10-22 DIAGNOSIS — Z23 Encounter for immunization: Secondary | ICD-10-CM

## 2015-10-22 LAB — BASIC METABOLIC PANEL
BUN: 44 mg/dL — ABNORMAL HIGH (ref 6–23)
CHLORIDE: 98 meq/L (ref 96–112)
CO2: 31 mEq/L (ref 19–32)
CREATININE: 2.79 mg/dL — AB (ref 0.40–1.50)
Calcium: 9.4 mg/dL (ref 8.4–10.5)
GFR: 23.24 mL/min — ABNORMAL LOW (ref >60.00)
Glucose, Bld: 106 mg/dL — ABNORMAL HIGH (ref 70–99)
POTASSIUM: 4.1 meq/L (ref 3.5–5.1)
SODIUM: 137 meq/L (ref 135–145)

## 2015-10-22 NOTE — Progress Notes (Signed)
Subjective:     Patient ID: Sean [name], male   DOB: 1931-03-09, 82 y.o.   MRN: [id number]  HPI Patient is seen for routine scheduled six-month medical follow-up but actually was discharged from hospital just a few days ago. He was admitted on 9/13 with exertional chest discomfort and worsening dyspnea on exertion. He also had recent syncopal episode while working at home and ICD interrogation revealed that he had episode of ventricular tachycardia and he was given a shock. Patient was started on amiodarone but continued to have presyncopal episodes and progressive dyspnea on exertion. He was felt to be hypervolemic on presentation and started on IV furosemide. Echocardiogram revealed ejection fraction 25-30% with severe mitral regurgitation. He was transitioned to Lasix 80 mg twice a day at discharge. He had been taking 80 in the morning and 40 in the afternoon prior to admission. He diuresed 3.1 L with discharge weight 178 pounds. He also remains on carvedilol, Imdur, losartan Home weight this AM undressed was 180.  He has multiple chronic problems including history of observed sleep apnea, hypertension, CAD, combined systolic and diastolic heart failure, history of ventricular tachycardia, history of atrial fibrillation, chronic kidney disease with baseline creatinine around 2.4. His discharge creatinine was 2.43.  He's felt somewhat weak and generally washed out and lightheaded since discharge. No further syncope He had some confusion during hospitalization which cleared once he returned home. Has not had any recent fever. He does have some clear postnasal drip which wife states he's had for several weeks and they think is more allergic.  Past Medical History:  Diagnosis Date  . Atrial fibrillation ([place]) 04/04/2010  . AV BLOCK 05/22/2008  . CAD, ARTERY BYPASS GRAFT 05/22/2008  . CARDIOMYOPATHY, ISCHEMIC 05/22/2008  . CAROTID ARTERY STENOSIS 11/15/2009  . CHF (congestive heart failure) ([facility])    class 2 to 3  . Chronic systolic heart failure ([place]) 10/13/2008  . COLONIC POLYPS, HX OF 04/07/2008  . Decreased left ventricular function   . DEGENERATIVE JOINT DISEASE, LEFT KNEE 04/07/2008  . DIVERTICULITIS, HX OF 04/07/2008  . GERD 04/07/2008  . HYPERLIPIDEMIA 04/07/2008  . HYPERTENSION 04/07/2008  . MITRAL VALVE PROLAPSE, NON-RHEUMATIC 05/22/2008  . RENAL INSUFFICIENCY 10/17/2008  . Shortness of breath dyspnea   . Ventricular tachycardia ([place])    Status post catheter ablation may 2013   Past Surgical History:  Procedure Laterality Date  . APPENDECTOMY    . CARDIAC CATHETERIZATION N/A 03/19/2015   Procedure: Left Heart Cath and Coronary Angiography;  Surgeon: Troy Sine, MD;  Location: [facility];  Service: Cardiovascular;  Laterality: N/A;  . CARDIAC SURGERY     open heart 1991, 1997, pacemaker insertion [place] 2010 CD [id number]  . CATARACT EXTRACTION Bilateral 2016  . CHOLECYSTECTOMY    . COLONOSCOPY N/A 08/26/2013   Procedure: COLONOSCOPY;  Surgeon: Beryle Beams, MD;  Location: [facility];  Service: Endoscopy;  Laterality: N/A;  . HERNIA REPAIR  1952   2004  . IMPLANTABLE CARDIOVERTER DEFIBRILLATOR GENERATOR CHANGE N/A 07/15/2013   Procedure: IMPLANTABLE CARDIOVERTER DEFIBRILLATOR GENERATOR CHANGE;  Surgeon: Deboraha Sprang, MD;  Location: [facility] CATH LAB;  Service: Cardiovascular;  Laterality: N/A;  . INSERT / REPLACE / REMOVE PACEMAKER     [employer] unify CD 3231/2010  . LEFT HEART CATHETERIZATION WITH CORONARY/GRAFT ANGIOGRAM N/A 04/30/2011   Procedure: LEFT HEART CATHETERIZATION WITH Beatrix Fetters;  Surgeon: Hillary Bow, MD;  Location: [facility] CATH LAB;  Service: Cardiovascular;  Laterality: N/A;  .  LUNG SURGERY  1987  . THORACOTOMY     secondary to lung mass  . V-TACH ABLATION N/A 06/26/2011   Procedure: V-TACH ABLATION;  Surgeon: Thompson Grayer, MD;  Location: [facility] CATH LAB;  Service: Cardiovascular;  Laterality: N/A;    reports that he quit smoking about 43 years ago.  His smoking use included Cigarettes. He has a 15.00 pack-year smoking history. He has never used smokeless tobacco. He reports that he does not drink alcohol or use drugs. family history includes Coronary artery disease in his mother; Heart disease in his brother, mother, and sister. Allergies  Allergen Reactions  . Carisoprodol     REACTION: rash  . Lisinopril     REACTION: cough  . Tape     Cloth Tape tears skin  . Zolpidem Tartrate Other (See Comments)    Altered mental status  . Penicillins Rash    Has patient had a PCN reaction causing immediate rash, facial/tongue/throat swelling, SOB or lightheadedness with hypotension:YES Has patient had a PCN reaction causing severe rash involving mucus membranes or skin necrosis: NO Has patient had a PCN reaction that required hospitalization NO Has patient had a PCN reaction occurring within the last 10 years: NO If all of the above answers are "NO", then may proceed with Cephalosporin use.     Review of Systems  Constitutional: Positive for fatigue. Negative for chills and fever.  Eyes: Negative for visual disturbance.  Respiratory: Positive for shortness of breath. Negative for cough and chest tightness. Stridor: With exertion.   Cardiovascular: Negative for chest pain, palpitations and leg swelling.  Gastrointestinal: Negative for abdominal pain.  Genitourinary: Negative for dysuria.  Neurological: Positive for dizziness, weakness and light-headedness. Negative for syncope and headaches.  Psychiatric/Behavioral: Negative for confusion.       Objective:   Physical Exam  Constitutional: He is oriented to person, place, and time. He appears well-developed and well-nourished. No distress.  Neck: Neck supple. No JVD present.  Cardiovascular: Normal rate and regular rhythm.   Murmur heard. Prominent murmur over mitral valve area  Pulmonary/Chest: Effort normal and breath sounds normal. No respiratory distress. He has no wheezes. He has  no rales.  Musculoskeletal: He exhibits no edema.  Neurological: He is alert and oriented to person, place, and time.       Assessment:     #1 history of combined systolic and diastolic heart failure with recent admission as above now on furosemide 80 mg twice a day  #2 history of chronic kidney disease stage IV  #3 history of CAD with stable exertional angina  #4 lightheadedness with sitting blood pressure today 100/50 and standing 90/50  #5 history of ventricular tachycardia with ICD  #6 history of hyperglycemia with recent A1c 5.7%  #7 cognitive impairment      Plan:     -Repeat basic metabolic panel -Flu vaccine given -Recommend daily weights and prompt follow-up for weight gain greater than 3 pounds in 1 day or 5 pounds in one week -Get up and change positions slowly and we recommend standing for several seconds before ambulating -Use a cane or other support at all times -Continue close follow-up with cardiology  Eulas Post MD [facility]

## 2015-10-22 NOTE — Patient Instructions (Signed)
Continue with daily weights and be in touch for weight gain > 3 pounds in one day or 5 pounds in a week.

## 2015-10-22 NOTE — Progress Notes (Signed)
Pre visit review using our clinic review tool, if applicable. No additional management support is needed unless otherwise documented below in the visit note. 

## 2015-10-23 ENCOUNTER — Telehealth: Payer: Self-pay | Admitting: Family Medicine

## 2015-10-23 ENCOUNTER — Ambulatory Visit (INDEPENDENT_AMBULATORY_CARE_PROVIDER_SITE_OTHER): Payer: Medicare Other

## 2015-10-23 DIAGNOSIS — I5042 Chronic combined systolic (congestive) and diastolic (congestive) heart failure: Secondary | ICD-10-CM

## 2015-10-23 DIAGNOSIS — Z95810 Presence of automatic (implantable) cardiac defibrillator: Secondary | ICD-10-CM | POA: Diagnosis not present

## 2015-10-23 NOTE — Progress Notes (Signed)
EPIC Encounter for ICM Monitoring  Patient Name: Sean [name] is a 82 y.o. male Date: 10/23/2015 Primary Care Physican: Eulas Post, MD Primary Cardiologist: Aundra Dubin Electrophysiologist: Caryl Comes Dry Weight: 180.4 lb  Bi-V Pacing:  98%       Spoke with wife.  Patient hospitalized 10/17/2015 to 10/21/2015 for feeling weakness, weight gain and SOB prior to hospitalization.   Thoracic impedance returned to normal after being diuresed at hospitalization 10/17/2015 to 10/19/2015.    Recommendations:  No changes today.  Advised wife to call if patient starts developing symptoms to help decrease risk of hospitalization.    Follow-up plan: ICM clinic phone appointment on 12/06/2015.  Next remote transmission for 91 day follow up 11/05/2015 and will review fluid levels.  HF office appointment on 11/01/2015 for post hospital follow up.    Copy of ICM check sent to primary cardiologist and device physician.   ICM trend: 10/23/2015       Rosalene Billings, RN 10/23/2015 4:21 PM

## 2015-10-23 NOTE — Telephone Encounter (Signed)
Pt talked to md this morning and per wife her husband  can not remember want  dr Elease Hashimoto said about his medication. Please call wife back

## 2015-10-23 NOTE — Telephone Encounter (Signed)
Pt's wife is aware of medication change.

## 2015-10-23 NOTE — Telephone Encounter (Signed)
Refill for one year 

## 2015-10-29 ENCOUNTER — Ambulatory Visit (INDEPENDENT_AMBULATORY_CARE_PROVIDER_SITE_OTHER): Payer: Medicare Other | Admitting: *Deleted

## 2015-10-29 DIAGNOSIS — I059 Rheumatic mitral valve disease, unspecified: Secondary | ICD-10-CM

## 2015-10-29 DIAGNOSIS — Z5181 Encounter for therapeutic drug level monitoring: Secondary | ICD-10-CM

## 2015-10-29 DIAGNOSIS — I4891 Unspecified atrial fibrillation: Secondary | ICD-10-CM

## 2015-10-29 DIAGNOSIS — I480 Paroxysmal atrial fibrillation: Secondary | ICD-10-CM

## 2015-10-29 DIAGNOSIS — Z7901 Long term (current) use of anticoagulants: Secondary | ICD-10-CM

## 2015-10-29 LAB — POCT INR: INR: 3.7

## 2015-10-30 ENCOUNTER — Other Ambulatory Visit: Payer: Self-pay | Admitting: Cardiology

## 2015-11-01 ENCOUNTER — Other Ambulatory Visit (HOSPITAL_COMMUNITY): Payer: Self-pay | Admitting: *Deleted

## 2015-11-01 ENCOUNTER — Encounter (HOSPITAL_COMMUNITY): Payer: Self-pay

## 2015-11-01 ENCOUNTER — Ambulatory Visit (HOSPITAL_COMMUNITY)
Admission: RE | Admit: 2015-11-01 | Discharge: 2015-11-01 | Disposition: A | Discharge status: Home / Self Care | Payer: Medicare Other | Source: Ambulatory Visit | Attending: Cardiology | Admitting: Cardiology

## 2015-11-01 VITALS — BP 124/60 | HR 60 | Wt 187.5 lb

## 2015-11-01 DIAGNOSIS — I059 Rheumatic mitral valve disease, unspecified: Secondary | ICD-10-CM

## 2015-11-01 DIAGNOSIS — I5042 Chronic combined systolic (congestive) and diastolic (congestive) heart failure: Secondary | ICD-10-CM | POA: Insufficient documentation

## 2015-11-01 DIAGNOSIS — I480 Paroxysmal atrial fibrillation: Secondary | ICD-10-CM | POA: Diagnosis not present

## 2015-11-01 LAB — COMPREHENSIVE METABOLIC PANEL
ALBUMIN: 3.7 g/dL (ref 3.5–5.0)
ALK PHOS: 31 U/L — AB (ref 38–126)
ALT: 19 U/L (ref 17–63)
ANION GAP: 7 (ref 5–15)
AST: 25 U/L (ref 15–41)
BUN: 31 mg/dL — ABNORMAL HIGH (ref 6–20)
CALCIUM: 9.5 mg/dL (ref 8.9–10.3)
CO2: 27 mmol/L (ref 22–32)
Chloride: 103 mmol/L (ref 101–111)
Creatinine, Ser: 2.29 mg/dL — ABNORMAL HIGH (ref 0.61–1.24)
GFR calc Af Amer: 29 mL/min — ABNORMAL LOW (ref >60)
GFR calc non Af Amer: 25 mL/min — ABNORMAL LOW (ref >60)
GLUCOSE: 105 mg/dL — AB (ref 65–99)
Potassium: 3.6 mmol/L (ref 3.5–5.1)
SODIUM: 137 mmol/L (ref 135–145)
Total Bilirubin: 0.8 mg/dL (ref 0.3–1.2)
Total Protein: 6.6 g/dL (ref 6.5–8.1)

## 2015-11-01 LAB — TSH: TSH: 4.845 u[IU]/mL — ABNORMAL HIGH (ref 0.350–4.500)

## 2015-11-01 MED ORDER — ATORVASTATIN CALCIUM 40 MG PO TABS: 40.0000 mg | ORAL_TABLET | Freq: Every day | ORAL | 3 refills | Status: DC

## 2015-11-01 NOTE — Patient Instructions (Signed)
Increase Atorvastatin to 40mg  daily.  Routine lab work today. Will notify you of abnormal results  Follow up with Dr.McLean in 6 weeks.

## 2015-11-03 NOTE — Progress Notes (Signed)
Patient ID: Sean [name], male   DOB: 07-01-1931, 82 y.o.   MRN: [id number] PCP: Dr. Elease Hashimoto Cardiology: Dr. Aundra Dubin  82 yo with history of CAD s/p CABG, ischemic cardiomyopathy, CKD, and paroxysmal atrial fibrillation presents for cardiology followup.  In the past, he has had a stable chest pain pattern walking up hill or walking fast. This resolves with rest.  He generally takes NTG 1-2 times/month.   In 2/17, he ran out of ranolazine and did not refill it.  He began to have more frequent episodes of chest pain with less exertion.  He was admitted to [facility].  LHC was similar to the study from 2013, no good interventional options.  LIMA-LAD and SVG-OM remain patent, other SVGs occluded. Echo in 2/17 showed stable EF 30-35%.    He was admitted in 9/17 with acute on chronic systolic CHF.  He was treated with IV Lasix and improved.  Echo in 9/17 showed EF 25-30% with severely dilated LV, severe eccentric mitral regurgitation.    He is doing ok today.  Able to walk around [employer] without much problem.   No problems walking on flat ground.  Weight is stable at home.  Poor appetite, generalized fatigue.  He has had minimal chest pain recently.   Labs (7/13): LDL 77, HDL 34 Labs (4/14): K 4.3, creatinine 2.0 Labs (5/14): K 4.2, creatinine 1.78 Labs (6/14): LDL 26, HDL 83 Labs (9/14): K 3.6, creatinine 2.0, BNP 308 Labs (6/15): K 3.9, creatinine 2.2, HCT 38.2, LDL 74, HDL 31 Labs (12/15): K 4, creatinine 2.3 Labs (1/16): LDL 88,  HDL 32, TGs 251 Labs (2/17): HCT 37.2, K 3.8, creatinine 2.01 Labs (3/17): LDL 74, HDL 28 Labs (7/17): K 4, creatinine 2.26 Labs (9/17): K 4.1, creatinine 2.79, BNP 592, LDL 111  Corevue: Thoracic impedance stable, 98% BiV pacing, no VT  PMH: 1. History of DVT 2. HTN: ACEI cough. 3. Hyperlipidemia 4. CAD s/p CABG in 1991 with redo in 1997.  [place] 2013 with SVG-OM patent and LIMA-LAD patent, other 3 SVGs closed.  Chronic stable angina.  - LHC (2/17) with patent  LIMA-LAD, patent SVG-OM, total occlusion of 3 other SVGs (similar to 2013 cath).  5. Ischemic cardiomyopathy: Echo in 4/13 with dilated LV, EF 30%, mildly dilated RV with mildly decreased RV function, moderate MR, mild AI.  - St Jude ICD with CRT upgrade in 9/10.  - Echo (2/17) with EF 30-35%, inferolateral/inferior akinesis, severe LV dilation, moderate MR.  - Echo (9/17) with EF 25-30%, severely dilated LV, akinesis of the inferior and inferoseptal walls, moderate AS, mild to moderate AI, severe eccentric MR, severe LAE.  6. Carotid stenosis: 5/14 carotid dopplers with 40-59% bilateral ICA stenosis and occluded right vertebral.  5/15 carotid dopplers with 40-59% bilateral ICA stenosis. 5/16 carotid dopplers with 40-59% BICA stenosis.  - Carotid dopplers (9/17) with 40-59% BICA stenosis.  7. GERD 8. CKD 9. OA 10. Paroxysmal atrial fibrillation 11. Mitral regurgitation: Severe on last echo.  12. VT: s/p ablation in 5/13.  - ICD discharge 9/17 13. H/o diverticulitis 14. H/o cholecystectomy 15. H/o appendectomy 16. Renal artery stenosis: Renal artery dopplers (5/14) with 60-99% proximal right renal artery stenosis as well as severe celiac artery stenosis.  17. ABIs (12/14) were normal. 18. Low back pain with sciatica 19. Dementia 20. Aortic stenosis: Moderate on last echo.   SH: Married, lives in [place], 2 kids, quit smoking in 1974.   FH: Mother, sister, and brother with CAD.  ROS: All  systems reviewed and negative except as per HPI.   Current Outpatient Prescriptions  Medication Sig Dispense Refill  . amiodarone (PACERONE) 200 MG tablet Take 200 mg by mouth daily.    . carvedilol (COREG) 25 MG tablet TAKE 1 TABLET BY MOUTH TWICE DAILY 180 tablet 3  . donepezil (ARICEPT) 10 MG tablet Take 1 tablet (10 mg total) by mouth at bedtime. 90 tablet 3  . furosemide (LASIX) 40 MG tablet Take 40-80 mg by mouth as directed. Take 80mg  in the AM and 40mg  in the PM    . isosorbide  mononitrate (IMDUR) 120 MG 24 hr tablet Take 1 tablet (120 mg total) by mouth daily. 30 tablet 3  . losartan (COZAAR) 50 MG tablet Take 1 tablet (50 mg total) by mouth daily. 90 tablet 3  . omeprazole (PRILOSEC) 20 MG capsule TAKE ONE CAPSULE BY MOUTH TWICE DAILY 60 capsule 5  . potassium chloride SA (K-DUR,KLOR-CON) 20 MEQ tablet TAKE 1 TABLET BY MOUTH TWICE DAILY 180 tablet 0  . ranolazine (RANEXA) 1000 MG SR tablet Take 1 tablet (1,000 mg total) by mouth 2 (two) times daily. 60 tablet 11  . warfarin (COUMADIN) 5 MG tablet TAKE AS DIRECTED BY COUMADIN CLINIC 90 tablet 0  . atorvastatin (LIPITOR) 40 MG tablet Take 1 tablet (40 mg total) by mouth daily. 90 tablet 3  . nitroGLYCERIN (NITROSTAT) 0.4 MG SL tablet PLACE 1 TABLET UNDER THE TONGUE EVERY 5 MINUTES AS NEEDED FOR CHEST PAIN (Patient not taking: Reported on 11/01/2015) 100 tablet 0   No current facility-administered medications for this encounter.    BP 124/60   Pulse 60   Wt 187 lb 8 oz (85 kg)   SpO2 100%   BMI 24.74 kg/m  General: NAD Neck: No JVD, no thyromegaly or thyroid nodule.  Lungs: Clear to auscultation bilaterally with normal respiratory effort. CV: Nondisplaced PMI.  Heart regular S1/S2, no S3/S4, 3/6 HSM apex.  No peripheral edema.  R carotid bruit.  1+ PT bilaterally.  Abdomen: Soft, nontender, no hepatosplenomegaly, no distention.   Neurologic: Alert and oriented x 3.  Psych: Normal affect. Extremities: No clubbing or cyanosis.   Assessment/Plan:  1. CAD: s/p CABG.  Chronic stable angina, minimal currently, improved on ranolazine.   - Continue ranolazine, Imdur, ARB, statin, and Coreg.   - He does not need aspirin as he has stable CAD and is on warfarin.   2. Chronic systolic CHF:  Ischemic cardiomyopathy.  EF 25-30% on last echo.  He does not appear volume overloaded on exam or by Corevue. He has a [employer] CRT-D device.  - Continue current Lasix dosing (80 qam, 40 qpm).   - Given CKD, will not titrate losartan  at this time or add spironolactone.   - Continue current Coreg (at goal dose).  - BMET today.   3. Hyperlipidemia: LDL too high when recently checked. Increase atorvastatin to 40 mg daily with lipids/LFTs in 2 months.  4. Mitral regurgitation: Severe on last echo, probably ischemic MR.  May consider Mitraclip in the future.  5. Carotid stenosis: Repeat carotid dopplers in 9/18.   6. Renal artery stenosis: Unilateral renal artery stenosis.  BP is controlled.  I do not think there is a lot of utility to opening this artery in the absence of uncontrolled HTN or bilateral stenosis.   7. Celiac artery stenosis: Severe celiac stenosis on abdominal dopplers.  He does not describe abdominal angina. Continue to follow.  8. Paroxysmal atrial fibrillation: Patient  is on warfarin.  He is in NSR today.  9. CKD: Stable recently, sees nephrology.  10.  VT: Prior VT ablation.  He has had episodes of VT this year.  Amiodarone was started.   - With amiodarone use, check LFTs and TSH.  He will need a regular eye exam.  [name] 11/03/2015

## 2015-11-05 ENCOUNTER — Ambulatory Visit (INDEPENDENT_AMBULATORY_CARE_PROVIDER_SITE_OTHER): Payer: Medicare Other | Admitting: *Deleted

## 2015-11-05 DIAGNOSIS — Z95810 Presence of automatic (implantable) cardiac defibrillator: Secondary | ICD-10-CM

## 2015-11-05 DIAGNOSIS — I255 Ischemic cardiomyopathy: Secondary | ICD-10-CM

## 2015-11-05 NOTE — Progress Notes (Signed)
Remote ICD transmission.   

## 2015-11-07 ENCOUNTER — Encounter: Payer: Self-pay | Admitting: Cardiology

## 2015-11-09 ENCOUNTER — Ambulatory Visit (HOSPITAL_COMMUNITY)
Admission: RE | Admit: 2015-11-09 | Discharge: 2015-11-09 | Disposition: A | Discharge status: Home / Self Care | Payer: Medicare Other | Source: Ambulatory Visit | Attending: Cardiology | Admitting: Cardiology

## 2015-11-09 DIAGNOSIS — I5042 Chronic combined systolic (congestive) and diastolic (congestive) heart failure: Secondary | ICD-10-CM | POA: Diagnosis present

## 2015-11-09 LAB — T4, FREE: Free T4: 1 ng/dL (ref 0.61–1.12)

## 2015-11-09 LAB — TSH: TSH: 4.34 u[IU]/mL (ref 0.350–4.500)

## 2015-11-10 LAB — T3, FREE: T3, Free: 2.7 pg/mL (ref 2.0–4.4)

## 2015-11-13 ENCOUNTER — Other Ambulatory Visit: Payer: Self-pay | Admitting: Family Medicine

## 2015-11-13 LAB — CUP PACEART REMOTE DEVICE CHECK
Brady Statistic AP VP Percent: 36 %
Brady Statistic AP VS Percent: 1 %
Brady Statistic AS VP Percent: 63 %
Brady Statistic AS VS Percent: 1 %
Date Time Interrogation Session: 20171002080019
HighPow Impedance: 42 Ohm
HighPow Impedance: 43 Ohm
Implantable Lead Implant Date: 20070212
Implantable Lead Implant Date: 20100927
Implantable Lead Location: 753858
Implantable Lead Location: 753859
Implantable Lead Model: 157
Lead Channel Impedance Value: 450 Ohm
Lead Channel Pacing Threshold Amplitude: 1 V
Lead Channel Pacing Threshold Pulse Width: 0.5 ms
Lead Channel Pacing Threshold Pulse Width: 0.5 ms
Lead Channel Sensing Intrinsic Amplitude: 1.4 mV
Lead Channel Setting Pacing Amplitude: 2 V
Lead Channel Setting Pacing Amplitude: 2.5 V
Lead Channel Setting Pacing Amplitude: 2.5 V
Lead Channel Setting Pacing Pulse Width: 1 ms
MDC IDC LEAD IMPLANT DT: 20100927
MDC IDC LEAD LOCATION: 753860
MDC IDC LEAD SERIAL: 132614
MDC IDC MSMT BATTERY REMAINING LONGEVITY: 42 mo
MDC IDC MSMT BATTERY REMAINING PERCENTAGE: 62 %
MDC IDC MSMT BATTERY VOLTAGE: 2.93 V
MDC IDC MSMT LEADCHNL LV IMPEDANCE VALUE: 280 Ohm
MDC IDC MSMT LEADCHNL LV PACING THRESHOLD AMPLITUDE: 1.5 V
MDC IDC MSMT LEADCHNL LV PACING THRESHOLD PULSEWIDTH: 1 ms
MDC IDC MSMT LEADCHNL RA IMPEDANCE VALUE: 360 Ohm
MDC IDC MSMT LEADCHNL RV PACING THRESHOLD AMPLITUDE: 0.5 V
MDC IDC MSMT LEADCHNL RV SENSING INTR AMPL: 12 mV
MDC IDC PG SERIAL: 7183458
MDC IDC SET LEADCHNL RV PACING PULSEWIDTH: 0.5 ms
MDC IDC SET LEADCHNL RV SENSING SENSITIVITY: 2 mV
MDC IDC STAT BRADY RA PERCENT PACED: 35 %

## 2015-11-15 ENCOUNTER — Ambulatory Visit (INDEPENDENT_AMBULATORY_CARE_PROVIDER_SITE_OTHER): Payer: Medicare Other

## 2015-11-15 ENCOUNTER — Telehealth: Payer: Self-pay | Admitting: Internal Medicine

## 2015-11-15 DIAGNOSIS — I480 Paroxysmal atrial fibrillation: Secondary | ICD-10-CM

## 2015-11-15 DIAGNOSIS — Z7901 Long term (current) use of anticoagulants: Secondary | ICD-10-CM | POA: Diagnosis not present

## 2015-11-15 DIAGNOSIS — I4891 Unspecified atrial fibrillation: Secondary | ICD-10-CM

## 2015-11-15 DIAGNOSIS — Z5181 Encounter for therapeutic drug level monitoring: Secondary | ICD-10-CM

## 2015-11-15 DIAGNOSIS — I059 Rheumatic mitral valve disease, unspecified: Secondary | ICD-10-CM

## 2015-11-15 LAB — POCT INR: INR: 2.5

## 2015-11-15 NOTE — Telephone Encounter (Signed)
Spoke with pts wife, pts wife stated that he told her he got shocked Tuesday night. Pts wife stated that she wasn't sure since the office calls with anything happens and that he gets confused. Informed pt's wife we had not received a transmission with a shock and that the pts home monitor is updated and would transmit if he did receive a shock. Pts wife voiced understanding.

## 2015-11-15 NOTE — Telephone Encounter (Signed)
New message       1. Has your device fired? Pt thinks he was shocked earlier this week  2. Is you device beeping? no 3. Are you experiencing draining or swelling at device site? no  4. Are you calling to see if we received your device transmission? no 5. Have you passed out? no

## 2015-11-16 ENCOUNTER — Encounter: Payer: Self-pay | Admitting: Internal Medicine

## 2015-12-06 ENCOUNTER — Ambulatory Visit (INDEPENDENT_AMBULATORY_CARE_PROVIDER_SITE_OTHER): Payer: Medicare Other

## 2015-12-06 DIAGNOSIS — Z95810 Presence of automatic (implantable) cardiac defibrillator: Secondary | ICD-10-CM | POA: Diagnosis not present

## 2015-12-06 DIAGNOSIS — I5042 Chronic combined systolic (congestive) and diastolic (congestive) heart failure: Secondary | ICD-10-CM | POA: Diagnosis not present

## 2015-12-06 NOTE — Progress Notes (Signed)
EPIC Encounter for ICM Monitoring  Patient Name: Sean [name] is a 82 y.o. male Date: 12/06/2015 Primary Care Physican: Eulas Post, MD Primary Cardiologist:[place] Electrophysiologist: Caryl Comes Dry Weight:    180.4 lb  Bi-V Pacing:  98%               Heart Failure questions reviewed, pt asymptomatic for fluid symptoms but reports being really tired and dizzy today.    Thoracic impedance normal.  Recommendations:  No changes.  Advised to limit salt intake to 2000 mg daily.  Encouraged to call for fluid symptoms.    Follow-up plan: ICM clinic phone appointment on 01/07/2016.  Copy of ICM check sent to device physician.   ICM trend: 12/06/2015       Rosalene Billings, RN 12/06/2015 3:33 PM

## 2015-12-14 ENCOUNTER — Ambulatory Visit (HOSPITAL_COMMUNITY)
Admission: RE | Admit: 2015-12-14 | Discharge: 2015-12-14 | Disposition: A | Discharge status: Home / Self Care | Payer: Medicare Other | Source: Ambulatory Visit | Attending: Cardiology | Admitting: Cardiology

## 2015-12-14 VITALS — BP 120/51 | Resp 18 | Wt 188.5 lb

## 2015-12-14 DIAGNOSIS — I130 Hypertensive heart and chronic kidney disease with heart failure and stage 1 through stage 4 chronic kidney disease, or unspecified chronic kidney disease: Secondary | ICD-10-CM | POA: Diagnosis not present

## 2015-12-14 DIAGNOSIS — K219 Gastro-esophageal reflux disease without esophagitis: Secondary | ICD-10-CM | POA: Insufficient documentation

## 2015-12-14 DIAGNOSIS — E785 Hyperlipidemia, unspecified: Secondary | ICD-10-CM | POA: Insufficient documentation

## 2015-12-14 DIAGNOSIS — F0390 Unspecified dementia without behavioral disturbance: Secondary | ICD-10-CM | POA: Diagnosis not present

## 2015-12-14 DIAGNOSIS — M1990 Unspecified osteoarthritis, unspecified site: Secondary | ICD-10-CM | POA: Diagnosis not present

## 2015-12-14 DIAGNOSIS — I255 Ischemic cardiomyopathy: Secondary | ICD-10-CM | POA: Insufficient documentation

## 2015-12-14 DIAGNOSIS — R42 Dizziness and giddiness: Secondary | ICD-10-CM | POA: Diagnosis not present

## 2015-12-14 DIAGNOSIS — N189 Chronic kidney disease, unspecified: Secondary | ICD-10-CM | POA: Insufficient documentation

## 2015-12-14 DIAGNOSIS — Z951 Presence of aortocoronary bypass graft: Secondary | ICD-10-CM | POA: Diagnosis not present

## 2015-12-14 DIAGNOSIS — I5022 Chronic systolic (congestive) heart failure: Secondary | ICD-10-CM | POA: Insufficient documentation

## 2015-12-14 DIAGNOSIS — Z79899 Other long term (current) drug therapy: Secondary | ICD-10-CM | POA: Insufficient documentation

## 2015-12-14 DIAGNOSIS — I480 Paroxysmal atrial fibrillation: Secondary | ICD-10-CM | POA: Insufficient documentation

## 2015-12-14 DIAGNOSIS — Z86718 Personal history of other venous thrombosis and embolism: Secondary | ICD-10-CM | POA: Diagnosis not present

## 2015-12-14 DIAGNOSIS — Z87891 Personal history of nicotine dependence: Secondary | ICD-10-CM | POA: Insufficient documentation

## 2015-12-14 DIAGNOSIS — Z7901 Long term (current) use of anticoagulants: Secondary | ICD-10-CM | POA: Diagnosis not present

## 2015-12-14 DIAGNOSIS — I340 Nonrheumatic mitral (valve) insufficiency: Secondary | ICD-10-CM | POA: Diagnosis not present

## 2015-12-14 DIAGNOSIS — I2510 Atherosclerotic heart disease of native coronary artery without angina pectoris: Secondary | ICD-10-CM | POA: Insufficient documentation

## 2015-12-14 DIAGNOSIS — I774 Celiac artery compression syndrome: Secondary | ICD-10-CM | POA: Insufficient documentation

## 2015-12-14 DIAGNOSIS — I701 Atherosclerosis of renal artery: Secondary | ICD-10-CM | POA: Insufficient documentation

## 2015-12-14 DIAGNOSIS — I5042 Chronic combined systolic (congestive) and diastolic (congestive) heart failure: Secondary | ICD-10-CM

## 2015-12-14 DIAGNOSIS — Z8249 Family history of ischemic heart disease and other diseases of the circulatory system: Secondary | ICD-10-CM | POA: Insufficient documentation

## 2015-12-14 DIAGNOSIS — I059 Rheumatic mitral valve disease, unspecified: Secondary | ICD-10-CM | POA: Diagnosis not present

## 2015-12-14 LAB — COMPREHENSIVE METABOLIC PANEL
ALT: 18 U/L (ref 17–63)
AST: 33 U/L (ref 15–41)
Albumin: 3.9 g/dL (ref 3.5–5.0)
Alkaline Phosphatase: 27 U/L — ABNORMAL LOW (ref 38–126)
Anion gap: 9 (ref 5–15)
BILIRUBIN TOTAL: 1 mg/dL (ref 0.3–1.2)
BUN: 36 mg/dL — AB (ref 6–20)
CALCIUM: 9.6 mg/dL (ref 8.9–10.3)
CO2: 27 mmol/L (ref 22–32)
CREATININE: 2.61 mg/dL — AB (ref 0.61–1.24)
Chloride: 101 mmol/L (ref 101–111)
GFR calc Af Amer: 25 mL/min — ABNORMAL LOW (ref >60)
GFR, EST NON AFRICAN AMERICAN: 21 mL/min — AB (ref >60)
Glucose, Bld: 109 mg/dL — ABNORMAL HIGH (ref 65–99)
Potassium: 4 mmol/L (ref 3.5–5.1)
Sodium: 137 mmol/L (ref 135–145)
TOTAL PROTEIN: 7 g/dL (ref 6.5–8.1)

## 2015-12-14 LAB — LIPID PANEL
Cholesterol: 168 mg/dL (ref 0–200)
HDL: 31 mg/dL — ABNORMAL LOW (ref >40)
LDL Cholesterol: 90 mg/dL (ref 0–99)
Total CHOL/HDL Ratio: 5.4 RATIO
Triglycerides: 234 mg/dL — ABNORMAL HIGH (ref <150)
VLDL: 47 mg/dL — ABNORMAL HIGH (ref 0–40)

## 2015-12-14 LAB — BRAIN NATRIURETIC PEPTIDE: B NATRIURETIC PEPTIDE 5: 779.2 pg/mL — AB (ref 0.0–100.0)

## 2015-12-14 MED ORDER — AMIODARONE HCL 100 MG PO TABS: 100.0000 mg | ORAL_TABLET | Freq: Every day | ORAL | 6 refills | Status: AC

## 2015-12-14 MED ORDER — POTASSIUM CHLORIDE CRYS ER 20 MEQ PO TBCR: EXTENDED_RELEASE_TABLET | ORAL | 6 refills | Status: AC

## 2015-12-14 MED ORDER — FUROSEMIDE 80 MG PO TABS: 80.0000 mg | ORAL_TABLET | Freq: Two times a day (BID) | ORAL | 6 refills | Status: DC

## 2015-12-14 NOTE — Patient Instructions (Signed)
Routine lab work today. Will notify you of abnormal results, otherwise no news is good news!  Return in 10 days for labs.  Will refer you to ENT at [place]. Address: [address], [place], [place] [address]  Phone: [phone]  ______________________________________________  ______________________________________________  INCREASE lasix to 80mg  twice daily. 80 mg tablets sent in to your preferred pharmacy.  INCREASE Potassium to 40 meq (2 tabs) in am and 20 meq (1 tab) in pm.  DECREASE Amiodarone to 100 mg once daily. 100 mg tablet sent in to your preferred pharmacy.  Follow up 1 month with Dr. Aundra Dubin.  Do the following things EVERYDAY: 1) Weigh yourself in the morning before breakfast. Write it down and keep it in a log. 2) Take your medicines as prescribed 3) Eat low salt foods-Limit salt (sodium) to 2000 mg per day.  4) Stay as active as you can everyday 5) Limit all fluids for the day to less than 2 liters

## 2015-12-15 NOTE — Progress Notes (Signed)
Patient ID: Sean [name], male   DOB: January 07, 1932, 82 y.o.   MRN: [id number] PCP: Dr. Elease Hashimoto Cardiology: Dr. Aundra Dubin  82 yo with history of CAD s/p CABG, ischemic cardiomyopathy, CKD, and paroxysmal atrial fibrillation presents for cardiology followup.  In the past, he has had a stable chest pain pattern walking up hill or walking fast. This resolves with rest.  He generally takes NTG 1-2 times/month.   In 2/17, he ran out of ranolazine and did not refill it.  He began to have more frequent episodes of chest pain with less exertion.  He was admitted to [facility].  LHC was similar to the study from 2013, no good interventional options.  LIMA-LAD and SVG-OM remain patent, other SVGs occluded. Echo in 2/17 showed stable EF 30-35%.    He was admitted in 9/17 with acute on chronic systolic CHF.  He was treated with IV Lasix and improved.  Echo in 9/17 showed EF 25-30% with severely dilated LV, severe eccentric mitral regurgitation.    For several weeks, he has noted a spinning sensation when he turns his head to one side or stands up (positional changes).  No falls but this affects his balance.  He was not orthostatic in the office today.  He is doing ok today.  Able to walk around [employer] without much problem.   He has been short of breath walking around in stores recently, perhaps a bit worse than usual for him.  Weight is stable.  He has his same anginal pattern, infrequent chest pain with exertion.  Also for 2-3 weeks, he has noted nausea after breakfast.    Labs (7/13): LDL 77, HDL 34 Labs (4/14): K 4.3, creatinine 2.0 Labs (5/14): K 4.2, creatinine 1.78 Labs (6/14): LDL 26, HDL 83 Labs (9/14): K 3.6, creatinine 2.0, BNP 308 Labs (6/15): K 3.9, creatinine 2.2, HCT 38.2, LDL 74, HDL 31 Labs (12/15): K 4, creatinine 2.3 Labs (1/16): LDL 88,  HDL 32, TGs 251 Labs (2/17): HCT 37.2, K 3.8, creatinine 2.01 Labs (3/17): LDL 74, HDL 28 Labs (7/17): K 4, creatinine 2.26 Labs (9/17): K 4.1, creatinine  2.79 => 2.29, BNP 592, LDL 111, hgb 12.7, LFTs normal Labs (10/17): TSH normal  Corevue: Impedance starting to trend down.  No VT.   PMH: 1. History of DVT 2. HTN: ACEI cough. 3. Hyperlipidemia 4. CAD s/p CABG in 1991 with redo in 1997.  [place] 2013 with SVG-OM patent and LIMA-LAD patent, other 3 SVGs closed.  Chronic stable angina.  - LHC (2/17) with patent LIMA-LAD, patent SVG-OM, total occlusion of 3 other SVGs (similar to 2013 cath).  5. Ischemic cardiomyopathy: Echo in 4/13 with dilated LV, EF 30%, mildly dilated RV with mildly decreased RV function, moderate MR, mild AI.  - St Jude ICD with CRT upgrade in 9/10.  - Echo (2/17) with EF 30-35%, inferolateral/inferior akinesis, severe LV dilation, moderate MR.  - Echo (9/17) with EF 25-30%, severely dilated LV, akinesis of the inferior and inferoseptal walls, moderate AS, mild to moderate AI, severe eccentric MR, severe LAE.  6. Carotid stenosis: 5/14 carotid dopplers with 40-59% bilateral ICA stenosis and occluded right vertebral.  5/15 carotid dopplers with 40-59% bilateral ICA stenosis. 5/16 carotid dopplers with 40-59% BICA stenosis.  - Carotid dopplers (9/17) with 40-59% BICA stenosis.  7. GERD 8. CKD 9. OA 10. Paroxysmal atrial fibrillation 11. Mitral regurgitation: Severe on last echo.  12. VT: s/p ablation in 5/13.  - ICD discharge 9/17 13. H/o diverticulitis 14.  H/o cholecystectomy 15. H/o appendectomy 16. Renal artery stenosis: Renal artery dopplers (5/14) with 60-99% proximal right renal artery stenosis as well as severe celiac artery stenosis.  17. ABIs (12/14) were normal. 18. Low back pain with sciatica 19. Dementia 20. Aortic stenosis: Moderate on last echo.   SH: Married, lives in [place], 2 kids, quit smoking in 1974.   FH: Mother, sister, and brother with CAD.  ROS: All systems reviewed and negative except as per HPI.   Current Outpatient Prescriptions  Medication Sig Dispense Refill  . amiodarone  (PACERONE) 100 MG tablet Take 1 tablet (100 mg total) by mouth daily. 30 tablet 6  . atorvastatin (LIPITOR) 40 MG tablet Take 1 tablet (40 mg total) by mouth daily. 90 tablet 3  . carvedilol (COREG) 25 MG tablet TAKE 1 TABLET BY MOUTH TWICE DAILY 180 tablet 3  . donepezil (ARICEPT) 10 MG tablet TAKE 1 TABLET(10 MG) BY MOUTH AT BEDTIME 90 tablet 1  . furosemide (LASIX) 80 MG tablet Take 1 tablet (80 mg total) by mouth 2 (two) times daily. Take 80mg  in the AM and 40mg  in the PM 60 tablet 6  . isosorbide mononitrate (IMDUR) 120 MG 24 hr tablet Take 1 tablet (120 mg total) by mouth daily. 30 tablet 3  . losartan (COZAAR) 50 MG tablet Take 1 tablet (50 mg total) by mouth daily. 90 tablet 3  . nitroGLYCERIN (NITROSTAT) 0.4 MG SL tablet PLACE 1 TABLET UNDER THE TONGUE EVERY 5 MINUTES AS NEEDED FOR CHEST PAIN 100 tablet 0  . omeprazole (PRILOSEC) 20 MG capsule TAKE ONE CAPSULE BY MOUTH TWICE DAILY 60 capsule 5  . potassium chloride SA (K-DUR,KLOR-CON) 20 MEQ tablet Take 40 meq (2 tabs) in am and 20 meq (1 tab) in pm 90 tablet 6  . ranolazine (RANEXA) 1000 MG SR tablet Take 1 tablet (1,000 mg total) by mouth 2 (two) times daily. 60 tablet 11  . warfarin (COUMADIN) 5 MG tablet TAKE AS DIRECTED BY COUMADIN CLINIC 90 tablet 0   No current facility-administered medications for this encounter.    BP (!) 120/51 (BP Location: Right Arm, Patient Position: Sitting, Cuff Size: Normal)   Resp 18   Wt 188 lb 8 oz (85.5 kg)   SpO2 94%   BMI 24.87 kg/m  General: NAD Neck: JVP 8-9 cm, no thyromegaly or thyroid nodule.  Lungs: Clear to auscultation bilaterally with normal respiratory effort. CV: Nondisplaced PMI.  Heart regular S1/S2, no S3/S4, 2/6 HSM apex.  Trace ankle edema.  R carotid bruit.  1+ PT bilaterally.  Abdomen: Soft, nontender, no hepatosplenomegaly, no distention.   Neurologic: Alert and oriented x 3.  Psych: Normal affect. Extremities: No clubbing or cyanosis.   Assessment/Plan:  1. CAD: s/p  CABG.  Chronic stable angina, no recent changes, improved on ranolazine.   - Continue ranolazine, Imdur, ARB, statin, and Coreg.   - He does not need aspirin as he has stable CAD and is on warfarin.   2. Chronic systolic CHF:  Ischemic cardiomyopathy.  EF 25-30% on last echo. He has a [employer] CRT-D device.  Mild volume overload on exam.  - Increase Lasix to 80 mg po bid.  BMET today and repeat in 10 days.   - Given CKD, will not titrate losartan at this time or add spironolactone.   - Continue current Coreg (at goal dose).  3. Hyperlipidemia: LDL too high when recently checked. Increased atorvastatin to 40 mg daily with lipids/LFTs to be done in another  month.  4. Mitral regurgitation: Severe on last echo, probably ischemic MR.  May consider Mitraclip in the future.  5. Carotid stenosis: Repeat carotid dopplers in 9/18.   6. Renal artery stenosis: Unilateral renal artery stenosis.  BP is controlled.  I do not think there is a lot of utility to opening this artery in the absence of uncontrolled HTN or bilateral stenosis.   7. Celiac artery stenosis: Severe celiac stenosis on abdominal dopplers.  He does not describe abdominal angina. Continue to follow.  8. Paroxysmal atrial fibrillation: Patient is on warfarin.  He is in NSR today.   9. CKD: Stable recently, sees nephrology.  10.  VT: Prior VT ablation.  He has had episodes of VT this year.  Amiodarone was started.   - Decrease amiodarone to 100 mg daily given chronic nausea, amiodarone may contribute.  - With amiodarone use, check LFTs today.  TSH recently normal.  He will need a regular eye exam. 11. Vertigo: Patient describes positional vertigo.  It is very bothersome.  I will make an ENT referral for him.   [name] 12/15/2015

## 2015-12-17 ENCOUNTER — Telehealth (HOSPITAL_COMMUNITY): Payer: Self-pay | Admitting: *Deleted

## 2015-12-17 ENCOUNTER — Other Ambulatory Visit (HOSPITAL_COMMUNITY): Payer: Self-pay | Admitting: *Deleted

## 2015-12-17 MED ORDER — ATORVASTATIN CALCIUM 80 MG PO TABS: 80.0000 mg | ORAL_TABLET | Freq: Every day | ORAL | 3 refills | Status: AC

## 2015-12-17 MED ORDER — FUROSEMIDE 80 MG PO TABS: 80.0000 mg | ORAL_TABLET | Freq: Two times a day (BID) | ORAL | 6 refills | Status: AC

## 2015-12-17 MED ORDER — LOSARTAN POTASSIUM 50 MG PO TABS: 25.0000 mg | ORAL_TABLET | Freq: Every day | ORAL | 3 refills | Status: AC

## 2015-12-17 NOTE — Telephone Encounter (Signed)
Notes Recorded by Harvie Junior, CMA on 12/17/2015 at 2:35 PM EST Patient aware. Spoke with patient and his wife they are aware of lab results and medication changes.   ------  Notes Recorded by Larey Dresser, MD on 12/16/2015 at 1:20 PM EST Creatinine higher, decrease losartan to 25 mg daily. Increase atorvastatin to 80 mg daily.    Ref Range & Units 3d ago (12/14/15) 10mo ago (11/01/15) 46mo ago (10/22/15)   Sodium 135 - 145 mmol/L 137  137  137R    Potassium 3.5 - 5.1 mmol/L 4.0  3.6  4.1R    Chloride 101 - 111 mmol/L 101  103  98R    CO2 22 - 32 mmol/L 27  27  31R    Glucose, Bld 65 - 99 mg/dL 109   105   106R     BUN 6 - 20 mg/dL 36   31   44R     Creatinine, Ser 0.61 - 1.24 mg/dL 2.61   2.29   2.79R     Calcium 8.9 - 10.3 mg/dL 9.6  9.5  9.4R    Total Protein 6.5 - 8.1 g/dL 7.0  6.6     Albumin 3.5 - 5.0 g/dL 3.9  3.7     AST 15 - 41 U/L 33  25     ALT 17 - 63 U/L 18  19     Alkaline Phosphatase 38 - 126 U/L 27   31      Total Bilirubin 0.3 - 1.2 mg/dL 1.0  0.8     GFR calc non Af Amer >60 mL/min 21   25      GFR calc Af Amer >60 mL/min 25   29CM

## 2015-12-17 NOTE — Telephone Encounter (Signed)
-----   Message from Larey Dresser, MD sent at 12/16/2015  1:20 PM EST ----- Creatinine higher, decrease losartan to 25 mg daily.  Increase atorvastatin to 80 mg daily.

## 2015-12-19 ENCOUNTER — Encounter: Payer: Self-pay | Admitting: Internal Medicine

## 2015-12-19 ENCOUNTER — Inpatient Hospital Stay (HOSPITAL_COMMUNITY): Payer: Medicare Other

## 2015-12-19 ENCOUNTER — Emergency Department (HOSPITAL_COMMUNITY): Payer: Medicare Other

## 2015-12-19 ENCOUNTER — Encounter (HOSPITAL_COMMUNITY): Payer: Self-pay

## 2015-12-19 ENCOUNTER — Inpatient Hospital Stay (HOSPITAL_COMMUNITY)
Admission: EM | Admit: 2015-12-19 | Discharge: 2016-01-04 | DRG: 308 | Disposition: E | Payer: Medicare Other | Attending: Cardiology | Admitting: Cardiology

## 2015-12-19 DIAGNOSIS — F0390 Unspecified dementia without behavioral disturbance: Secondary | ICD-10-CM | POA: Diagnosis present

## 2015-12-19 DIAGNOSIS — I480 Paroxysmal atrial fibrillation: Secondary | ICD-10-CM | POA: Diagnosis present

## 2015-12-19 DIAGNOSIS — Z66 Do not resuscitate: Secondary | ICD-10-CM | POA: Diagnosis not present

## 2015-12-19 DIAGNOSIS — Z91048 Other nonmedicinal substance allergy status: Secondary | ICD-10-CM

## 2015-12-19 DIAGNOSIS — I25118 Atherosclerotic heart disease of native coronary artery with other forms of angina pectoris: Secondary | ICD-10-CM | POA: Diagnosis present

## 2015-12-19 DIAGNOSIS — I248 Other forms of acute ischemic heart disease: Secondary | ICD-10-CM | POA: Diagnosis not present

## 2015-12-19 DIAGNOSIS — I462 Cardiac arrest due to underlying cardiac condition: Secondary | ICD-10-CM | POA: Diagnosis not present

## 2015-12-19 DIAGNOSIS — I472 Ventricular tachycardia: Secondary | ICD-10-CM | POA: Diagnosis present

## 2015-12-19 DIAGNOSIS — Z7901 Long term (current) use of anticoagulants: Secondary | ICD-10-CM

## 2015-12-19 DIAGNOSIS — I130 Hypertensive heart and chronic kidney disease with heart failure and stage 1 through stage 4 chronic kidney disease, or unspecified chronic kidney disease: Secondary | ICD-10-CM | POA: Diagnosis present

## 2015-12-19 DIAGNOSIS — E785 Hyperlipidemia, unspecified: Secondary | ICD-10-CM | POA: Diagnosis present

## 2015-12-19 DIAGNOSIS — I340 Nonrheumatic mitral (valve) insufficiency: Secondary | ICD-10-CM | POA: Diagnosis present

## 2015-12-19 DIAGNOSIS — R410 Disorientation, unspecified: Secondary | ICD-10-CM

## 2015-12-19 DIAGNOSIS — N184 Chronic kidney disease, stage 4 (severe): Secondary | ICD-10-CM | POA: Diagnosis present

## 2015-12-19 DIAGNOSIS — I459 Conduction disorder, unspecified: Secondary | ICD-10-CM | POA: Diagnosis not present

## 2015-12-19 DIAGNOSIS — R0603 Acute respiratory distress: Secondary | ICD-10-CM | POA: Diagnosis not present

## 2015-12-19 DIAGNOSIS — Z87891 Personal history of nicotine dependence: Secondary | ICD-10-CM

## 2015-12-19 DIAGNOSIS — E876 Hypokalemia: Secondary | ICD-10-CM | POA: Diagnosis not present

## 2015-12-19 DIAGNOSIS — Z789 Other specified health status: Secondary | ICD-10-CM

## 2015-12-19 DIAGNOSIS — Z888 Allergy status to other drugs, medicaments and biological substances status: Secondary | ICD-10-CM

## 2015-12-19 DIAGNOSIS — Z880 Allergy status to penicillin: Secondary | ICD-10-CM

## 2015-12-19 DIAGNOSIS — Z781 Physical restraint status: Secondary | ICD-10-CM

## 2015-12-19 DIAGNOSIS — F05 Delirium due to known physiological condition: Secondary | ICD-10-CM | POA: Diagnosis not present

## 2015-12-19 DIAGNOSIS — I951 Orthostatic hypotension: Secondary | ICD-10-CM

## 2015-12-19 DIAGNOSIS — I255 Ischemic cardiomyopathy: Secondary | ICD-10-CM | POA: Diagnosis present

## 2015-12-19 DIAGNOSIS — N170 Acute kidney failure with tubular necrosis: Secondary | ICD-10-CM | POA: Diagnosis present

## 2015-12-19 DIAGNOSIS — R570 Cardiogenic shock: Secondary | ICD-10-CM | POA: Diagnosis not present

## 2015-12-19 DIAGNOSIS — Z452 Encounter for adjustment and management of vascular access device: Secondary | ICD-10-CM

## 2015-12-19 DIAGNOSIS — R918 Other nonspecific abnormal finding of lung field: Secondary | ICD-10-CM | POA: Diagnosis present

## 2015-12-19 DIAGNOSIS — Z95810 Presence of automatic (implantable) cardiac defibrillator: Secondary | ICD-10-CM

## 2015-12-19 DIAGNOSIS — I5043 Acute on chronic combined systolic (congestive) and diastolic (congestive) heart failure: Secondary | ICD-10-CM | POA: Diagnosis not present

## 2015-12-19 DIAGNOSIS — N179 Acute kidney failure, unspecified: Secondary | ICD-10-CM

## 2015-12-19 DIAGNOSIS — Z515 Encounter for palliative care: Secondary | ICD-10-CM | POA: Diagnosis not present

## 2015-12-19 DIAGNOSIS — Z8249 Family history of ischemic heart disease and other diseases of the circulatory system: Secondary | ICD-10-CM

## 2015-12-19 DIAGNOSIS — I774 Celiac artery compression syndrome: Secondary | ICD-10-CM | POA: Diagnosis present

## 2015-12-19 DIAGNOSIS — Z951 Presence of aortocoronary bypass graft: Secondary | ICD-10-CM

## 2015-12-19 DIAGNOSIS — J810 Acute pulmonary edema: Secondary | ICD-10-CM | POA: Diagnosis not present

## 2015-12-19 DIAGNOSIS — N183 Chronic kidney disease, stage 3 (moderate): Secondary | ICD-10-CM | POA: Diagnosis present

## 2015-12-19 DIAGNOSIS — I5023 Acute on chronic systolic (congestive) heart failure: Secondary | ICD-10-CM | POA: Diagnosis not present

## 2015-12-19 DIAGNOSIS — I341 Nonrheumatic mitral (valve) prolapse: Secondary | ICD-10-CM | POA: Diagnosis present

## 2015-12-19 DIAGNOSIS — R9389 Abnormal findings on diagnostic imaging of other specified body structures: Secondary | ICD-10-CM

## 2015-12-19 DIAGNOSIS — J811 Chronic pulmonary edema: Secondary | ICD-10-CM

## 2015-12-19 DIAGNOSIS — R55 Syncope and collapse: Secondary | ICD-10-CM | POA: Diagnosis not present

## 2015-12-19 DIAGNOSIS — R0600 Dyspnea, unspecified: Secondary | ICD-10-CM

## 2015-12-19 LAB — CBC WITH DIFFERENTIAL/PLATELET
Basophils Absolute: 0 10*3/uL (ref 0.0–0.1)
Basophils Relative: 0 %
EOS ABS: 0.1 10*3/uL (ref 0.0–0.7)
EOS PCT: 3 %
HCT: 36.5 % — ABNORMAL LOW (ref 39.0–52.0)
Hemoglobin: 11.9 g/dL — ABNORMAL LOW (ref 13.0–17.0)
LYMPHS ABS: 0.8 10*3/uL (ref 0.7–4.0)
LYMPHS PCT: 15 %
MCH: 29 pg (ref 26.0–34.0)
MCHC: 32.6 g/dL (ref 30.0–36.0)
MCV: 88.8 fL (ref 78.0–100.0)
MONO ABS: 0.6 10*3/uL (ref 0.1–1.0)
MONOS PCT: 12 %
Neutro Abs: 3.6 10*3/uL (ref 1.7–7.7)
Neutrophils Relative %: 70 %
PLATELETS: 150 10*3/uL (ref 150–400)
RBC: 4.11 MIL/uL — ABNORMAL LOW (ref 4.22–5.81)
RDW: 15.4 % (ref 11.5–15.5)
WBC: 5.1 10*3/uL (ref 4.0–10.5)

## 2015-12-19 LAB — BASIC METABOLIC PANEL
Anion gap: 8 (ref 5–15)
Anion gap: 9 (ref 5–15)
BUN: 33 mg/dL — AB (ref 6–20)
BUN: 34 mg/dL — AB (ref 6–20)
CHLORIDE: 105 mmol/L (ref 101–111)
CO2: 22 mmol/L (ref 22–32)
CO2: 26 mmol/L (ref 22–32)
CREATININE: 2.64 mg/dL — AB (ref 0.61–1.24)
CREATININE: 2.94 mg/dL — AB (ref 0.61–1.24)
Calcium: 9 mg/dL (ref 8.9–10.3)
Calcium: 9.4 mg/dL (ref 8.9–10.3)
Chloride: 103 mmol/L (ref 101–111)
GFR calc Af Amer: 21 mL/min — ABNORMAL LOW (ref >60)
GFR calc Af Amer: 24 mL/min — ABNORMAL LOW (ref >60)
GFR calc non Af Amer: 21 mL/min — ABNORMAL LOW (ref >60)
GFR, EST NON AFRICAN AMERICAN: 18 mL/min — AB (ref >60)
GLUCOSE: 125 mg/dL — AB (ref 65–99)
GLUCOSE: 170 mg/dL — AB (ref 65–99)
Potassium: 3.6 mmol/L (ref 3.5–5.1)
Potassium: 4.7 mmol/L (ref 3.5–5.1)
SODIUM: 136 mmol/L (ref 135–145)
SODIUM: 137 mmol/L (ref 135–145)

## 2015-12-19 LAB — MAGNESIUM
MAGNESIUM: 2.1 mg/dL (ref 1.7–2.4)
Magnesium: 2.2 mg/dL (ref 1.7–2.4)

## 2015-12-19 LAB — BLOOD GAS, ARTERIAL
ACID-BASE DEFICIT: 7.3 mmol/L — AB (ref 0.0–2.0)
BICARBONATE: 17.9 mmol/L — AB (ref 20.0–28.0)
Drawn by: 252031
FIO2: 100
O2 Saturation: 96.2 %
PCO2 ART: 37.2 mmHg (ref 32.0–48.0)
PH ART: 7.303 — AB (ref 7.350–7.450)
PO2 ART: 104 mmHg (ref 83.0–108.0)
Patient temperature: 98.6

## 2015-12-19 LAB — COMPREHENSIVE METABOLIC PANEL
ALT: 18 U/L (ref 17–63)
ANION GAP: 10 (ref 5–15)
AST: 27 U/L (ref 15–41)
Albumin: 3.5 g/dL (ref 3.5–5.0)
Alkaline Phosphatase: 26 U/L — ABNORMAL LOW (ref 38–126)
BUN: 36 mg/dL — ABNORMAL HIGH (ref 6–20)
CALCIUM: 9.2 mg/dL (ref 8.9–10.3)
CHLORIDE: 101 mmol/L (ref 101–111)
CO2: 26 mmol/L (ref 22–32)
CREATININE: 3.23 mg/dL — AB (ref 0.61–1.24)
GFR, EST AFRICAN AMERICAN: 19 mL/min — AB (ref >60)
GFR, EST NON AFRICAN AMERICAN: 16 mL/min — AB (ref >60)
Glucose, Bld: 173 mg/dL — ABNORMAL HIGH (ref 65–99)
Potassium: 3.8 mmol/L (ref 3.5–5.1)
SODIUM: 137 mmol/L (ref 135–145)
Total Bilirubin: 0.9 mg/dL (ref 0.3–1.2)
Total Protein: 6.5 g/dL (ref 6.5–8.1)

## 2015-12-19 LAB — URINALYSIS, ROUTINE W REFLEX MICROSCOPIC
BILIRUBIN URINE: NEGATIVE
GLUCOSE, UA: NEGATIVE mg/dL
HGB URINE DIPSTICK: NEGATIVE
Ketones, ur: NEGATIVE mg/dL
Leukocytes, UA: NEGATIVE
Nitrite: NEGATIVE
Protein, ur: NEGATIVE mg/dL
SPECIFIC GRAVITY, URINE: 1.015 (ref 1.005–1.030)
pH: 5 (ref 5.0–8.0)

## 2015-12-19 LAB — CBC
HCT: 37 % — ABNORMAL LOW (ref 39.0–52.0)
HEMATOCRIT: 36.6 % — AB (ref 39.0–52.0)
HEMOGLOBIN: 12 g/dL — AB (ref 13.0–17.0)
Hemoglobin: 12.1 g/dL — ABNORMAL LOW (ref 13.0–17.0)
MCH: 29.4 pg (ref 26.0–34.0)
MCH: 29.6 pg (ref 26.0–34.0)
MCHC: 32.7 g/dL (ref 30.0–36.0)
MCHC: 32.8 g/dL (ref 30.0–36.0)
MCV: 89.8 fL (ref 78.0–100.0)
MCV: 90.4 fL (ref 78.0–100.0)
PLATELETS: 148 10*3/uL — AB (ref 150–400)
Platelets: 143 10*3/uL — ABNORMAL LOW (ref 150–400)
RBC: 4.05 MIL/uL — ABNORMAL LOW (ref 4.22–5.81)
RBC: 4.12 MIL/uL — ABNORMAL LOW (ref 4.22–5.81)
RDW: 15.7 % — AB (ref 11.5–15.5)
RDW: 15.8 % — ABNORMAL HIGH (ref 11.5–15.5)
WBC: 8.1 10*3/uL (ref 4.0–10.5)
WBC: 8.6 10*3/uL (ref 4.0–10.5)

## 2015-12-19 LAB — I-STAT TROPONIN, ED: TROPONIN I, POC: 0.2 ng/mL — AB (ref 0.00–0.08)

## 2015-12-19 LAB — PROTIME-INR
INR: 3.39
Prothrombin Time: 35.1 seconds — ABNORMAL HIGH (ref 11.4–15.2)

## 2015-12-19 LAB — TROPONIN I
TROPONIN I: 0.19 ng/mL — AB (ref <0.03)
Troponin I: 0.27 ng/mL (ref <0.03)

## 2015-12-19 MED ORDER — LIDOCAINE BOLUS VIA INFUSION
50.0000 mg | Freq: Once | INTRAVENOUS | Status: AC
  Administered 2015-12-19: 50 mg via INTRAVENOUS
  Filled 2015-12-19: qty 52

## 2015-12-19 MED ORDER — LIDOCAINE IN D5W 4-5 MG/ML-% IV SOLN
1.0000 mg/min | INTRAVENOUS | Status: DC
  Administered 2015-12-19: 1 mg/min via INTRAVENOUS
  Filled 2015-12-19 (×2): qty 500

## 2015-12-19 MED ORDER — AMIODARONE HCL IN DEXTROSE 360-4.14 MG/200ML-% IV SOLN
60.0000 mg/h | INTRAVENOUS | Status: AC
  Administered 2015-12-19 – 2015-12-20 (×2): 60 mg/h via INTRAVENOUS
  Filled 2015-12-19: qty 200

## 2015-12-19 MED ORDER — ISOSORBIDE MONONITRATE ER 60 MG PO TB24
120.0000 mg | ORAL_TABLET | Freq: Every day | ORAL | Status: DC
  Administered 2015-12-19 – 2015-12-21 (×3): 120 mg via ORAL
  Filled 2015-12-19 (×3): qty 2

## 2015-12-19 MED ORDER — CARVEDILOL 6.25 MG PO TABS
6.2500 mg | ORAL_TABLET | Freq: Two times a day (BID) | ORAL | Status: DC
  Administered 2015-12-19 – 2015-12-20 (×2): 6.25 mg via ORAL
  Filled 2015-12-19 (×2): qty 1

## 2015-12-19 MED ORDER — ATORVASTATIN CALCIUM 80 MG PO TABS
80.0000 mg | ORAL_TABLET | Freq: Every day | ORAL | Status: DC
  Administered 2015-12-19 – 2015-12-20 (×2): 80 mg via ORAL
  Filled 2015-12-19 (×2): qty 1

## 2015-12-19 MED ORDER — SODIUM CHLORIDE 0.9% FLUSH
3.0000 mL | Freq: Two times a day (BID) | INTRAVENOUS | Status: DC
  Administered 2015-12-19 – 2015-12-21 (×4): 3 mL via INTRAVENOUS

## 2015-12-19 MED ORDER — AMIODARONE HCL IN DEXTROSE 360-4.14 MG/200ML-% IV SOLN
60.0000 mg/h | INTRAVENOUS | Status: DC
  Administered 2015-12-20 (×3): 30 mg/h via INTRAVENOUS
  Administered 2015-12-21 (×2): 60 mg/h via INTRAVENOUS
  Filled 2015-12-19 (×4): qty 200

## 2015-12-19 MED ORDER — AMIODARONE LOAD VIA INFUSION
150.0000 mg | Freq: Once | INTRAVENOUS | Status: AC
Start: 2015-12-19 — End: 2015-12-19
  Administered 2015-12-19: 150 mg via INTRAVENOUS
  Filled 2015-12-19: qty 83.34

## 2015-12-19 MED ORDER — SODIUM CHLORIDE 0.9 % IV BOLUS (SEPSIS)
250.0000 mL | Freq: Once | INTRAVENOUS | Status: AC
  Administered 2015-12-19: 250 mL via INTRAVENOUS

## 2015-12-19 MED ORDER — SODIUM CHLORIDE 0.9 % IV SOLN
INTRAVENOUS | Status: AC
  Administered 2015-12-19: 16:00:00 via INTRAVENOUS

## 2015-12-19 MED ORDER — SODIUM CHLORIDE 0.9 % IV SOLN: 250.0000 mL | INTRAVENOUS | Status: DC | PRN

## 2015-12-19 MED ORDER — AMIODARONE HCL IN DEXTROSE 360-4.14 MG/200ML-% IV SOLN
INTRAVENOUS | Status: AC
  Filled 2015-12-19: qty 200

## 2015-12-19 MED ORDER — RANOLAZINE ER 500 MG PO TB12
1000.0000 mg | ORAL_TABLET | Freq: Two times a day (BID) | ORAL | Status: DC
  Administered 2015-12-20 (×3): 1000 mg via ORAL
  Filled 2015-12-19 (×4): qty 2

## 2015-12-19 MED ORDER — ACETAMINOPHEN 325 MG PO TABS
650.0000 mg | ORAL_TABLET | ORAL | Status: DC | PRN
  Administered 2015-12-22: 650 mg via ORAL
  Filled 2015-12-19: qty 2

## 2015-12-19 MED ORDER — WARFARIN SODIUM 1 MG PO TABS
1.0000 mg | ORAL_TABLET | Freq: Once | ORAL | Status: AC
  Administered 2015-12-19: 1 mg via ORAL
  Filled 2015-12-19 (×2): qty 1

## 2015-12-19 MED ORDER — DONEPEZIL HCL 10 MG PO TABS
10.0000 mg | ORAL_TABLET | Freq: Every day | ORAL | Status: DC
  Administered 2015-12-20 – 2015-12-21 (×3): 10 mg via ORAL
  Filled 2015-12-19 (×4): qty 1

## 2015-12-19 MED ORDER — ONDANSETRON HCL 4 MG/2ML IJ SOLN: 4.0000 mg | Freq: Four times a day (QID) | INTRAMUSCULAR | Status: DC | PRN

## 2015-12-19 MED ORDER — SODIUM CHLORIDE 0.9% FLUSH: 3.0000 mL | INTRAVENOUS | Status: DC | PRN

## 2015-12-19 MED ORDER — PANTOPRAZOLE SODIUM 40 MG PO TBEC
40.0000 mg | DELAYED_RELEASE_TABLET | Freq: Every day | ORAL | Status: DC
  Administered 2015-12-19 – 2015-12-21 (×3): 40 mg via ORAL
  Filled 2015-12-19 (×3): qty 1

## 2015-12-19 MED ORDER — WARFARIN - PHARMACIST DOSING INPATIENT
Freq: Every day | Status: DC
  Administered 2015-12-19: 18:00:00

## 2015-12-19 MED ORDER — AMIODARONE HCL 100 MG PO TABS
100.0000 mg | ORAL_TABLET | Freq: Every day | ORAL | Status: DC
  Administered 2015-12-19: 100 mg via ORAL
  Filled 2015-12-19: qty 1

## 2015-12-19 NOTE — ED Notes (Addendum)
Pt. MAP <56mmHg. EDP made aware. 247mL bolus ordered at this time. Pt. AOx4 with no complaints.

## 2015-12-19 NOTE — Progress Notes (Signed)
Code Blue was called due to pt being unresponsive and no pulses. On arrival CPR activated and ACLS protocol was underway. Once at head of the bed I administered 100% bagged mask manual ventilation for approx about 90min. After which noted patient to now have pulses, I palpitated his carotid pulse and RN palpitate femoral pulse. Both pulses present. At this point I ask patient a series of questions and for him to squeeze my hand firmly. He followed commands without hesitation and was able to respond verbally to answer my questions. Dentures were removed from mouth per RRT and given to RN.  Furthermore during the duration of the Code, I was administering 100% blow by oxygen through bag mask ventilation. Current rhythm Sinus Tachycardia, pt is diaphoretic, pt will be transfer to ICU. RT with patient  throughout transport o2 via nasal cannula in route. Report given verbally to unit RRT.

## 2015-12-19 NOTE — ED Notes (Signed)
EDP at bedside  

## 2015-12-19 NOTE — ED Provider Notes (Signed)
[address] DEPT Provider Note   CSN: [id number] Arrival date & time: 12/20/2015  [id number]     History   Chief Complaint Chief Complaint  Patient presents with  . Loss of Consciousness    HPI Sean [name] is a 82 y.o. male.  HPI Patient presents by EMS for suspected AICD firing. States he was in his normal health this morning when he woke up. He became lightheaded and generally weak and diaphoretic and nauseated. He sat down and laid his head on the table. Wife found him and he was unresponsive. Regained consciousness 1-2 minutes later. Had one episode of vomiting. Denies any chest pain or shortness of breath. States he feels better. Past Medical History:  Diagnosis Date  . Atrial fibrillation ([place]) 04/04/2010  . AV BLOCK 05/22/2008  . CAD, ARTERY BYPASS GRAFT 05/22/2008  . CARDIOMYOPATHY, ISCHEMIC 05/22/2008  . CAROTID ARTERY STENOSIS 11/15/2009  . CHF (congestive heart failure) (HCC)    class 2 to 3  . Chronic systolic heart failure ([place]) 10/13/2008  . COLONIC POLYPS, HX OF 04/07/2008  . Decreased left ventricular function   . DEGENERATIVE JOINT DISEASE, LEFT KNEE 04/07/2008  . DIVERTICULITIS, HX OF 04/07/2008  . GERD 04/07/2008  . HYPERLIPIDEMIA 04/07/2008  . HYPERTENSION 04/07/2008  . MITRAL VALVE PROLAPSE, NON-RHEUMATIC 05/22/2008  . RENAL INSUFFICIENCY 10/17/2008  . Shortness of breath dyspnea   . Ventricular tachycardia ([place])    Status post catheter ablation may 2013    Patient Active Problem List   Diagnosis Date Noted  . Syncope 12/13/2015  . Anticoagulated on warfarin   . Faintness   . Pain in the chest 10/17/2015  . Pre-syncope 10/17/2015  . Chest pain 10/17/2015  . Acute on chronic systolic CHF (congestive heart failure) ([place])   . CKD (chronic kidney disease) stage 4, GFR 15-29 ml/min (HCC)   . Near syncope   . Abnormal chest x-ray 03/19/2015  . Coronary artery disease involving coronary bypass graft of native heart with unstable angina pectoris ([place])   . Unstable  angina ([place]) 03/15/2015  . Cognitive impairment 10/23/2014  . Encounter for therapeutic drug monitoring 02/28/2013  . Leg pain 01/18/2013  . Mitral regurgitation 05/09/2012  . CKD (chronic kidney disease) stage 3, GFR 30-59 ml/min 08/14/2011  . OSA (obstructive sleep apnea) 01/24/2011  . Ventricular tachycardia ([place]) 11/07/2010  . Biventricular implantable cardioverter-defibrillator in situ 11/07/2010  . Long term (current) use of anticoagulants 05/03/2010  . Paroxysmal atrial fibrillation ([place]) 04/04/2010  . CAROTID ARTERY STENOSIS 11/15/2009  . Chronic combined systolic and diastolic heart failure ([place]) 10/13/2008  . Atherosclerosis of coronary artery bypass graft with unstable angina pectoris ([place]) 05/22/2008  . CARDIOMYOPATHY, ISCHEMIC 05/22/2008  . Mitral valve disorder 05/22/2008  . AV BLOCK 05/22/2008  . Hyperlipidemia 04/07/2008  . Essential hypertension 04/07/2008  . GERD 04/07/2008  . DEGENERATIVE JOINT DISEASE, LEFT KNEE 04/07/2008    Past Surgical History:  Procedure Laterality Date  . APPENDECTOMY    . CARDIAC CATHETERIZATION N/A 03/19/2015   Procedure: Left Heart Cath and Coronary Angiography;  Surgeon: Troy Sine, MD;  Location: [facility];  Service: Cardiovascular;  Laterality: N/A;  . CARDIAC SURGERY     open heart 1991, 1997, pacemaker insertion [place] 2010 CD 3231  . CATARACT EXTRACTION Bilateral 2016  . CHOLECYSTECTOMY    . COLONOSCOPY N/A 08/26/2013   Procedure: COLONOSCOPY;  Surgeon: Beryle Beams, MD;  Location: [facility];  Service: Endoscopy;  Laterality: N/A;  . HERNIA REPAIR  1952   2004  . IMPLANTABLE CARDIOVERTER DEFIBRILLATOR GENERATOR CHANGE N/A 07/15/2013   Procedure: IMPLANTABLE CARDIOVERTER DEFIBRILLATOR GENERATOR CHANGE;  Surgeon: Deboraha Sprang, MD;  Location: [facility] CATH LAB;  Service: Cardiovascular;  Laterality: N/A;  . INSERT / REPLACE / REMOVE PACEMAKER     [employer] unify CD 3231/2010  . LEFT HEART CATHETERIZATION WITH  CORONARY/GRAFT ANGIOGRAM N/A 04/30/2011   Procedure: LEFT HEART CATHETERIZATION WITH Beatrix Fetters;  Surgeon: Hillary Bow, MD;  [facility];  Service: Cardiovascular;  Laterality: N/A;  . LUNG SURGERY  1987  . THORACOTOMY     secondary to lung mass  . V-TACH ABLATION N/A 06/26/2011   Procedure: V-TACH ABLATION;  Surgeon: Thompson Grayer, MD;  Location: [facility] CATH LAB;  Service: Cardiovascular;  Laterality: N/A;       Home Medications    Prior to Admission medications   Medication Sig Start Date End Date Taking? Authorizing Provider  amiodarone (PACERONE) 100 MG tablet Take 1 tablet (100 mg total) by mouth daily. 12/14/15  Yes Larey Dresser, MD  atorvastatin (LIPITOR) 80 MG tablet Take 1 tablet (80 mg total) by mouth daily. 12/17/15  Yes Larey Dresser, MD  carvedilol (COREG) 25 MG tablet TAKE 1 TABLET BY MOUTH TWICE DAILY 06/13/15  Yes Larey Dresser, MD  donepezil (ARICEPT) 10 MG tablet TAKE 1 TABLET(10 MG) BY MOUTH AT BEDTIME 11/14/15  Yes Eulas Post, MD  furosemide (LASIX) 80 MG tablet Take 1 tablet (80 mg total) by mouth 2 (two) times daily. Take 80mg  in the AM and 40mg  in the PM Patient taking differently: Take 80 mg by mouth 2 (two) times daily.  12/17/15  Yes Larey Dresser, MD  isosorbide mononitrate (IMDUR) 120 MG 24 hr tablet Take 1 tablet (120 mg total) by mouth daily. 10/04/15  Yes Larey Dresser, MD  losartan (COZAAR) 50 MG tablet Take 0.5 tablets (25 mg total) by mouth daily. 12/17/15  Yes Larey Dresser, MD  omeprazole (PRILOSEC) 20 MG capsule TAKE ONE CAPSULE BY MOUTH TWICE DAILY 10/23/15  Yes Eulas Post, MD  potassium chloride SA (K-DUR,KLOR-CON) 20 MEQ tablet Take 40 meq (2 tabs) in am and 20 meq (1 tab) in pm 12/14/15  Yes Larey Dresser, MD  ranolazine (RANEXA) 1000 MG SR tablet Take 1 tablet (1,000 mg total) by mouth 2 (two) times daily. 03/20/15  Yes Brett Canales, PA-C  warfarin (COUMADIN) 5 MG tablet Take 2.5 mg by mouth daily.  Patient takes 2.5 mg everyday except on Monday, on Monday patient takes no coumadin.   Yes Historical Provider, MD  nitroGLYCERIN (NITROSTAT) 0.4 MG SL tablet PLACE 1 TABLET UNDER THE TONGUE EVERY 5 MINUTES AS NEEDED FOR CHEST PAIN 11/15/14   Larey Dresser, MD  warfarin (COUMADIN) 5 MG tablet TAKE AS DIRECTED BY COUMADIN CLINIC 10/30/15   Larey Dresser, MD    Family History Family History  Problem Relation Age of Onset  . Heart disease Mother     CAD, deceased   . Coronary artery disease Mother   . Heart disease Sister   . Heart disease Brother     Social History Social History  Substance Use Topics  . Smoking status: Former Smoker    Packs/day: 0.50    Years: 30.00    Types: Cigarettes    Quit date: 04/24/1972  . Smokeless tobacco: Never Used  . Alcohol use No     Allergies   Carisoprodol; Lisinopril; Tape;  Zolpidem tartrate; and Penicillins   Review of Systems Review of Systems  Constitutional: Positive for diaphoresis.  Respiratory: Negative for shortness of breath.   Cardiovascular: Negative for chest pain and palpitations.  Gastrointestinal: Positive for nausea and vomiting. Negative for abdominal pain.  Musculoskeletal: Negative for back pain, neck pain and neck stiffness.  Skin: Negative for rash and wound.  Neurological: Positive for dizziness, syncope, weakness (generalized) and light-headedness. Negative for headaches.  All other systems reviewed and are negative.    Physical Exam Updated Vital Signs BP (!) 101/44   Pulse 64   Temp 97.8 F (36.6 C) (Oral)   Resp 23   Ht 6\' 1"  (1.854 m)   Wt 180 lb 9.6 oz (81.9 kg)   SpO2 99%   BMI 23.83 kg/m   Physical Exam  Constitutional: He is oriented to person, place, and time. He appears well-developed and well-nourished. No distress.  HENT:  Head: Normocephalic and atraumatic.  Mouth/Throat: Oropharynx is clear and moist.  No evidence of head trauma.  Eyes: EOM are normal. Pupils are equal, round, and  reactive to light.  Neck: Normal range of motion. Neck supple. No JVD present.  No posterior midline cervical tenderness to palpation.  Cardiovascular: Normal rate and regular rhythm.  Exam reveals no gallop and no friction rub.   Murmur (holosystolic murmur) heard. Pulmonary/Chest: Effort normal and breath sounds normal. No respiratory distress. He has no wheezes. He has no rales. He exhibits no tenderness.  AICD palpated in the right anterior chest. No tenderness, erythema or warmth.  Abdominal: Soft. Bowel sounds are normal. There is no tenderness. There is no rebound and no guarding.  Musculoskeletal: Normal range of motion. He exhibits no edema or tenderness.  1+ bilateral lower extremity pitting edema. Pelvis stable. Distal pulses intact.  Neurological: He is alert and oriented to person, place, and time.  Moving all extremity is without deficit. Sensation is fully intact.  Skin: Skin is warm and dry. No rash noted. No erythema.  Psychiatric: He has a normal mood and affect. His behavior is normal.  Nursing note and vitals reviewed.    ED Treatments / Results  Labs (all labs ordered are listed, but only abnormal results are displayed) Labs Reviewed  CBC WITH DIFFERENTIAL/PLATELET - Abnormal; Notable for the following:       Result Value   RBC 4.11 (*)    Hemoglobin 11.9 (*)    HCT 36.5 (*)    All other components within normal limits  COMPREHENSIVE METABOLIC PANEL - Abnormal; Notable for the following:    Glucose, Bld 173 (*)    BUN 36 (*)    Creatinine, Ser 3.23 (*)    Alkaline Phosphatase 26 (*)    GFR calc non Af Amer 16 (*)    GFR calc Af Amer 19 (*)    All other components within normal limits  PROTIME-INR - Abnormal; Notable for the following:    Prothrombin Time 35.1 (*)    All other components within normal limits  I-STAT TROPOININ, ED - Abnormal; Notable for the following:    Troponin i, poc 0.20 (*)    All other components within normal limits  URINALYSIS,  ROUTINE W REFLEX MICROSCOPIC (NOT AT [facility])    EKG  EKG Interpretation  Date/Time:  Wednesday December 19 2015 07:43:40 EST Ventricular Rate:  67 PR Interval:    QRS Duration: 169 QT Interval:  533 QTC Calculation: 563 R Axis:   -46 Text Interpretation:  Ventricular-paced complexes No  further analysis attempted due to paced rhythm Confirmed by [facility]  MD, Alea Ryer ([id number]) on 01/03/2016 9:31:17 AM       Radiology Dg Chest Port 1 View  Result Date: 12/29/2015 CLINICAL DATA:  Dizziness for a few days EXAM: PORTABLE CHEST 1 VIEW COMPARISON:  10/17/2015 FINDINGS: Persisting right perihilar opacity. This finding is chronic to a degree when compared to more remote imaging, but conspicuity is mildly increased over time. Postoperative right mid lung with chain sutures. Chronic blunting of the lateral right costophrenic sulcus. There is no edema, consolidation, effusion, or pneumothorax. Chronic cardiopericardial enlargement. The patient is status post CABG. Biventricular ICD/pacer from the right. No acute osseous finding. Chronic right rotator cuff tear with high humeral head. IMPRESSION: 1. Right perihilar opacity may be related to remote ipsilateral lung surgery, but is more conspicuous than on remote exams. Recommend nonemergent chest CT follow-up, contrast not essential if there is contraindication. 2. No acute finding. Electronically Signed   By: Monte Fantasia M.D.   On: 01/02/2016 09:51    Procedures Procedures (including critical care time)  Medications Ordered in ED Medications  Warfarin - Pharmacist Dosing Inpatient (not administered)  warfarin (COUMADIN) tablet 1 mg (not administered)  sodium chloride 0.9 % bolus 250 mL (0 mLs Intravenous Stopped 01/01/2016 1002)     Initial Impression / Assessment and Plan / ED Course  I have reviewed the triage vital signs and the nursing notes.  Pertinent labs & imaging results that were available during my care of the patient were reviewed  by me and considered in my medical decision making (see chart for details).  Clinical Course     Pacer defibrillator interrogated. No firing episodes. Mild hypertension in the emergency department. Given gentle rehydration. Discussed with cardiology who will see patient. Cardiology to admit. Final Clinical Impressions(s) / ED Diagnoses   Final diagnoses:  Confusion  Abnormal x-ray  Confusion  Abnormal x-ray    New Prescriptions New Prescriptions   No medications on file     Julianne Rice, MD 01/03/2016 1307

## 2015-12-19 NOTE — ED Notes (Signed)
Cardiology at bedside.

## 2015-12-19 NOTE — ED Notes (Signed)
Patient transported to CT 

## 2015-12-19 NOTE — ED Notes (Signed)
Attempted report 

## 2015-12-19 NOTE — Code Documentation (Signed)
[place] responded to Code Blue; No family present. [place] available if needed.  [phone].

## 2015-12-19 NOTE — Progress Notes (Signed)
[place] for warfarin Indication: atrial fibrillation   Assessment: 84 yom with afib on warfarin PTA. Pharmacy consulted to continue inpatient. INR slightly supratherapeutic 3.39 on admit. Hg 11.9, plt wnl, no bleed documented. Noted on amiodarone PTA.  PTA warfarin dose: 2.5mg  except no warfarin on Mon (last dose PTA 11/14)  Goal of Therapy:  INR 2-3 Monitor platelets by anticoagulation protocol: Yes   Plan:  Warfarin 1mg  x 1 dose tonight - smaller dose with slightly supratherapeutic INR on admit Daily INR Monitor CBC, s/sx bleeding   Elicia Lamp, PharmD, BCPS Clinical Pharmacist 01/03/2016 12:51 PM

## 2015-12-19 NOTE — ED Notes (Addendum)
Pt. Had abnormal EKG tracing on monitor. Cardiology paged at this time.   NP Harlan Stains reviewed print out of event.

## 2015-12-19 NOTE — Progress Notes (Signed)
CTSP due to VT arrest.  Patient got up to go to the bathroom and was not complaining of anything at the time. In the bathroom noted to be in slow VT on monitor at 120bpm.  At that time he was asymptomatic and got him back in bed but then he became dizzy and SOB then became unresponsive with no pulse.  Code blue called and CPR started for 1 minute.  Patient then was defibrillated x 1 with AV paced post defib.  Patient then complained of chest pressure over left breast with SOB which he continues to complain of.  EKG showed AV paced rhythm.  Will send off stat Trop.  Start IV Amio bolus 150mg  and then gtt at 60mg /hr x 6 hours and then 30mg /hr.  Check BMET, Mg and Ca.  Now transferred to CCU.  He states that symptoms he had tonight during VT are identical to what he was feeling at home at time of syncopal episode. Patient noted on admission to have AKI felt secondary to low volume and ARB and diuretics stopped and started on IVF.  BB was decreased.  No changes to be made to ICD at this time.  Case discussed with Dr. Lovena Le.

## 2015-12-19 NOTE — H&P (Signed)
History & Physical    Patient ID: Sean [name] MRN: [id number], DOB/AGE: 06-05-1931   Admit date: 12/05/2015   Primary Physician: Eulas Post, MD Primary Cardiologist: Dr. Aundra Dubin  Patient Profile    82 yo with history of CAD s/p CABG, ischemic cardiomyopathy, CKD, and paroxysmal atrial fibrillation who presented to the ED after an unwitnessed syncopal episode this morning.   Past Medical History    Past Medical History:  Diagnosis Date  . Atrial fibrillation ([place]) 04/04/2010  . AV BLOCK 05/22/2008  . CAD, ARTERY BYPASS GRAFT 05/22/2008  . CARDIOMYOPATHY, ISCHEMIC 05/22/2008  . CAROTID ARTERY STENOSIS 11/15/2009  . CHF (congestive heart failure) (HCC)    class 2 to 3  . Chronic systolic heart failure ([place]) 10/13/2008  . COLONIC POLYPS, HX OF 04/07/2008  . Decreased left ventricular function   . DEGENERATIVE JOINT DISEASE, LEFT KNEE 04/07/2008  . DIVERTICULITIS, HX OF 04/07/2008  . GERD 04/07/2008  . HYPERLIPIDEMIA 04/07/2008  . HYPERTENSION 04/07/2008  . MITRAL VALVE PROLAPSE, NON-RHEUMATIC 05/22/2008  . RENAL INSUFFICIENCY 10/17/2008  . Shortness of breath dyspnea   . Ventricular tachycardia ([place])    Status post catheter ablation may 2013    Past Surgical History:  Procedure Laterality Date  . APPENDECTOMY    . CARDIAC CATHETERIZATION N/A 03/19/2015   Procedure: Left Heart Cath and Coronary Angiography;  Surgeon: Troy Sine, MD;  Location: [place] [facility];  Service: Cardiovascular;  Laterality: N/A;  . CARDIAC SURGERY     open heart 1991, 1997, pacemaker insertion [place] 2010 CD 3231  . CATARACT EXTRACTION Bilateral 2016  . CHOLECYSTECTOMY    . COLONOSCOPY N/A 08/26/2013   Procedure: COLONOSCOPY;  Surgeon: Beryle Beams, MD;  Location: [facility];  Service: Endoscopy;  Laterality: N/A;  . HERNIA REPAIR  1952   2004  . IMPLANTABLE CARDIOVERTER DEFIBRILLATOR GENERATOR CHANGE N/A 07/15/2013   Procedure: IMPLANTABLE CARDIOVERTER DEFIBRILLATOR GENERATOR CHANGE;   Surgeon: Deboraha Sprang, MD;  Location: [facility] CATH LAB;  Service: Cardiovascular;  Laterality: N/A;  . INSERT / REPLACE / REMOVE PACEMAKER     [employer] unify CD 3231/2010  . LEFT HEART CATHETERIZATION WITH CORONARY/GRAFT ANGIOGRAM N/A 04/30/2011   Procedure: LEFT HEART CATHETERIZATION WITH Beatrix Fetters;  Surgeon: Hillary Bow, MD;  Location: [facility] CATH LAB;  Service: Cardiovascular;  Laterality: N/A;  . LUNG SURGERY  1987  . THORACOTOMY     secondary to lung mass  . V-TACH ABLATION N/A 06/26/2011   Procedure: V-TACH ABLATION;  Surgeon: Thompson Grayer, MD;  Location: [facility] CATH LAB;  Service: Cardiovascular;  Laterality: N/A;     Allergies  Allergies  Allergen Reactions  . Carisoprodol     REACTION: rash  . Lisinopril     REACTION: cough  . Tape     Cloth Tape tears skin  . Zolpidem Tartrate Other (See Comments)    Altered mental status  . Penicillins Rash    Has patient had a PCN reaction causing immediate rash, facial/tongue/throat swelling, SOB or lightheadedness with hypotension:YES Has patient had a PCN reaction causing severe rash involving mucus membranes or skin necrosis: NO Has patient had a PCN reaction that required hospitalization NO Has patient had a PCN reaction occurring within the last 10 years: NO If all of the above answers are "NO", then may proceed with Cephalosporin use.    History of Present Illness    Sean [name] is a 82 yo male with PMH of CAD s/p CABG, ICM s/p  ICD EF 20-25%, CKD, VT s/p ablation and PAF. He is followed by Dr. Aundra Dubin in the CHF clinic.He has had stable stable angina and reports using nitro on an occasional basis. Back in 2/17 he reported running out of his ranolazine and began to have more freq episodes of chest pain with minimal exertion. He was admitted and had a LHC noted similar to that in 2013 (LIMA-LAD and SVG-OM remain patent, other SVGs occluded) with no good intervention options.   He was again admitted in 9/17 with acute on  chronic systolic CHF, and improved with Iv lasix. EF at that time showed severely dilated LV, severe eccentric mitral regurgitation. He followed up in the office on 12/14/15 where he reported a spinning sensation when he turned his head or stood up. Denied any falls. Also reported increased dyspnea with activity and nausea. His last device check note mild volume overload, therefore his lasix was increased to 80mg  BID. His amiodarone was reduced to 100mg  daily to help with reported nausea. Plan was made to referral to ENT for suspected vertigo.   He presented back to the ED today after having an unwitnessed syncopal episode. States he was in his utility room, bending over and began to experience light-headed, and felt nauseated. Walked into the kitchen and sat down, does not remember anything after that. Wife found him sitting at the table and was not responding appropriately. Difficult to arouse. Proceeded to call EMS.   Labs in the ED showed elevated Cr 2.61>>3.23, Poc Trop 0.20, Hgb 11.9. CXR with no acute findings.   [employer] CRT-D device interrogatedj: 99% BiV pacing, no atrial fibrillation or VT, Corevue with elevated impedance not suggestive of volume overload.   Home Medications    Prior to Admission medications   Medication Sig Start Date End Date Taking? Authorizing Provider  amiodarone (PACERONE) 100 MG tablet Take 1 tablet (100 mg total) by mouth daily. 12/14/15  Yes Larey Dresser, MD  atorvastatin (LIPITOR) 80 MG tablet Take 1 tablet (80 mg total) by mouth daily. 12/17/15  Yes Larey Dresser, MD  carvedilol (COREG) 25 MG tablet TAKE 1 TABLET BY MOUTH TWICE DAILY 06/13/15  Yes Larey Dresser, MD  donepezil (ARICEPT) 10 MG tablet TAKE 1 TABLET(10 MG) BY MOUTH AT BEDTIME 11/14/15  Yes Eulas Post, MD  furosemide (LASIX) 80 MG tablet Take 1 tablet (80 mg total) by mouth 2 (two) times daily. Take 80mg  in the AM and 40mg  in the PM Patient taking differently: Take 80 mg by mouth 2 (two)  times daily.  12/17/15  Yes Larey Dresser, MD  isosorbide mononitrate (IMDUR) 120 MG 24 hr tablet Take 1 tablet (120 mg total) by mouth daily. 10/04/15  Yes Larey Dresser, MD  losartan (COZAAR) 50 MG tablet Take 0.5 tablets (25 mg total) by mouth daily. 12/17/15  Yes Larey Dresser, MD  omeprazole (PRILOSEC) 20 MG capsule TAKE ONE CAPSULE BY MOUTH TWICE DAILY 10/23/15  Yes Eulas Post, MD  potassium chloride SA (K-DUR,KLOR-CON) 20 MEQ tablet Take 40 meq (2 tabs) in am and 20 meq (1 tab) in pm 12/14/15  Yes Larey Dresser, MD  ranolazine (RANEXA) 1000 MG SR tablet Take 1 tablet (1,000 mg total) by mouth 2 (two) times daily. 03/20/15  Yes Brett Canales, PA-C  warfarin (COUMADIN) 5 MG tablet Take 2.5 mg by mouth daily. Patient takes 2.5 mg everyday except on Monday, on Monday patient takes no coumadin.   Yes Historical  Provider, MD  nitroGLYCERIN (NITROSTAT) 0.4 MG SL tablet PLACE 1 TABLET UNDER THE TONGUE EVERY 5 MINUTES AS NEEDED FOR CHEST PAIN 11/15/14   Larey Dresser, MD  warfarin (COUMADIN) 5 MG tablet TAKE AS DIRECTED BY COUMADIN CLINIC 10/30/15   Larey Dresser, MD    Family History    Family History  Problem Relation Age of Onset  . Heart disease Mother     CAD, deceased   . Coronary artery disease Mother   . Heart disease Sister   . Heart disease Brother     Social History    Social History   Social History  . Marital status: Married    Spouse name: N/A  . Number of children: 2  . Years of education: N/A   Occupational History  .  Retired   Social History Main Topics  . Smoking status: Former Smoker    Packs/day: 0.50    Years: 30.00    Types: Cigarettes    Quit date: 04/24/1972  . Smokeless tobacco: Never Used  . Alcohol use No  . Drug use: No  . Sexual activity: Not on file   Other Topics Concern  . Not on file   Social History Narrative  . No narrative on file     Review of Systems    General:  No chills, fever, night sweats or weight changes.   Cardiovascular:  See HPI Dermatological: No rash, lesions/masses Respiratory: No cough, dyspnea Urologic: No hematuria, dysuria Abdominal:   No nausea, vomiting, diarrhea, bright red blood per rectum, melena, or hematemesis Neurologic:  No visual changes, wkns, changes in mental status. ++ syncope All other systems reviewed and are otherwise negative except as noted above.  Physical Exam    Blood pressure (!) 101/44, pulse 64, temperature 97.8 F (36.6 C), temperature source Oral, resp. rate 23, height 6\' 1"  (1.854 m), weight 180 lb 9.6 oz (81.9 kg), SpO2 99 %.  General: Pleasant, NAD Psych: Normal affect. Neuro: Alert and oriented X 3. Moves all extremities spontaneously. HEENT: Normal  Neck: Supple without bruits or JVD. Lungs:  Resp regular and unlabored, CTA. Heart: RRR no s3, s4, 4/6 systolic murmur. Abdomen: Soft, non-tender, non-distended, BS + x 4.  Extremities: No clubbing, cyanosis or edema. DP/PT/Radials 2+ and equal bilaterally.  Labs    Troponin [facility] Test)  Recent Labs  12/30/2015 0806  TROPIPOC 0.20*   No results for input(s): CKTOTAL, CKMB, TROPONINI in the last 72 hours. Lab Results  Component Value Date   WBC 5.1 12/16/2015   HGB 11.9 (L) 12/15/2015   HCT 36.5 (L) 12/15/2015   MCV 88.8 12/08/2015   PLT 150 12/26/2015    Recent Labs Lab 12/20/2015 0759  NA 137  K 3.8  CL 101  CO2 26  BUN 36*  CREATININE 3.23*  CALCIUM 9.2  PROT 6.5  BILITOT 0.9  ALKPHOS 26*  ALT 18  AST 27  GLUCOSE 173*   Lab Results  Component Value Date   CHOL 168 12/14/2015   HDL 31 (L) 12/14/2015   LDLCALC 90 12/14/2015   TRIG 234 (H) 12/14/2015   No results found for: [facility]   Radiology Studies    Dg Chest Port 1 View  Result Date: 12/06/2015 CLINICAL DATA:  Dizziness for a few days EXAM: PORTABLE CHEST 1 VIEW COMPARISON:  10/17/2015 FINDINGS: Persisting right perihilar opacity. This finding is chronic to a degree when compared to more remote imaging,  but conspicuity is mildly increased  over time. Postoperative right mid lung with chain sutures. Chronic blunting of the lateral right costophrenic sulcus. There is no edema, consolidation, effusion, or pneumothorax. Chronic cardiopericardial enlargement. The patient is status post CABG. Biventricular ICD/pacer from the right. No acute osseous finding. Chronic right rotator cuff tear with high humeral head. IMPRESSION: 1. Right perihilar opacity may be related to remote ipsilateral lung surgery, but is more conspicuous than on remote exams. Recommend nonemergent chest CT follow-up, contrast not essential if there is contraindication. 2. No acute finding. Electronically Signed   By: Monte Fantasia M.D.   On: 01/02/2016 09:51    ECG & Cardiac Imaging    EKG: AV paced  Echo: 10/18/15  Study Conclusions  - Left ventricle: The cavity size was severely dilated. Wall   thickness was normal. Systolic function was severely reduced. The   estimated ejection fraction was in the range of 25% to 30%.   Akinesis and scarring of the inferior and inferoseptal   myocardium. Hypokinesis of the inferolateral myocardium. Features   are consistent with a pseudonormal left ventricular filling   pattern, with concomitant abnormal relaxation and increased   filling pressure (grade 2 diastolic dysfunction). - Ventricular septum: Septal motion showed abnormal function and   dyssynergy. - Aortic valve: Transvalvular velocity was increased less than   expected, due to low cardiac output. There was moderate stenosis.   There was mild to moderate regurgitation directed centrally in   the LVOT. Valve area (VTI): 1.45 cm^2. Valve area (Vmax): 1.4   cm^2. - Mitral valve: There was malcoaptation of the valve leaflets.   There was severe regurgitation directed eccentrically and toward   the free wall. - Left atrium: The atrium was severely dilated. - Right atrium: The atrium was mildly dilated.  Assessment & Plan      Plan per Dr. Aundra Dubin  Signed, Reino Bellis, NP-C Pager [phone] 12/09/2015, 11:20 AM   82 yo with CAD s/p CABG, CKD stage III, ischemic cardiomyopathy, h/o VT presented with presyncope.  BP has been low and he has been lightheaded for a few weeks now, worse recently.  1. Presyncope/syncope: Occurred this morning but has been lightheaded with standing for a number of days now.  Creatinine up to 3.23.  Wife states he has been more confused.  Possible orthostatic symptoms with over-diuresis.  No evidence for VT or atrial fibrillation on device interrogation.  Exam and Corevue suggest low volume. - Stop losartan with AKI and low BP.  Decrease Coreg to 6.25 mg bid.  Stop Lasix for now.  - Gentle IVF: NS at 75 cc/hr for 8 hrs.  2. Confusion: Baseline dementia.  Suspect this may be worse due to low BP/cerebral hypoperfusion.  Will get head CT.  3. CAD: s/p CABG.  Chronic stable angina, no recent changes.  No chest pain prior to this admission.  TnI mildly elevated to 0.2, this may be demand ischemia due to hypotension.   - Continue ranolazine, Imdur, statin.   - He does not need aspirin as he has stable CAD and is on warfarin.   - Cycle troponin.  4. Chronic systolic CHF:  Ischemic cardiomyopathy.  EF 25-30% on last echo. He has a [employer] CRT-D device.  Volume status likely low with orthostatic changes. - Hold Lasix and losartan.   - Decrease Coreg to 6.25 mg bid, should not get a dose until this evening. - Careful hydration.   5. Hyperlipidemia: Continue atorvastatin. 6. Mitral regurgitation: Severe on last echo, probably  ischemic MR.   7. Celiac artery stenosis: Severe celiac stenosis on abdominal dopplers.  He does not describe abdominal angina. Continue to follow.  8. Paroxysmal atrial fibrillation: Patient is on warfarin.  He is in NSR today.  No recent atrial fibrillation on device interrogation. 9. AKI on CKD: Suspect prerenal vs ATN in setting of low BP/over-diuresis.  10.  VT:  Prior VT ablation.  He has had episodes of VT this year.  Amiodarone was started.   - Continue amiodarone 100 mg daily.  11. Abnormal CXR: More prominent peri-hilar opacity.  Will get noncontrast chest CT when we do head CT.  [name] 12/17/2015 11:52 AM

## 2015-12-19 NOTE — ED Triage Notes (Signed)
Pt. Coming from home via [facility] after his AICD shocked him this morning. Pt. Reports he woke up at 0630 and ate breakfast. Soon afterwards he felt diaphoretic, weak, and dizzy. Pt. States this is how he feels before his defib fires. Pt. Found by wife on the floor. Pt. Alert for wife, but confused when she found him. Pt. Aox4 at this time with no complaints. Pt. AICD [employer] serial number [id number].

## 2015-12-19 NOTE — ED Notes (Signed)
Wife assisting patient with urinal at this time.

## 2015-12-19 NOTE — Code Documentation (Signed)
CODE BLUE NOTE  Patient Name: Sean [name]   MRN: [id number]   Date of Birth/ Sex: [birth year] , male      Admission Date: 12/16/2015  Attending Provider: Larey Dresser, MD  Primary Diagnosis: <principal problem not specified>    Indication: Pt was in his usual state of health until this PM, when he was noted to be in [place]. Code blue was subsequently called. At the time of arrival on scene, ACLS protocol was underway.    Technical Description:  - CPR performance duration:  1 minute   - Was defibrillation or cardioversion used? Yes   - Was external pacer placed? Yes  - Was patient intubated pre/post CPR? No    Medications Administered: Y = Yes; Blank = No Amiodarone    Atropine    Calcium    Epinephrine    Lidocaine    Magnesium    Norepinephrine    Phenylephrine    Sodium bicarbonate    Vasopressin      Post CPR evaluation:  - Final Status - Was patient successfully resuscitated ? Yes - What is current rhythm? Sinus Tachycardia, intermittent Vtach not sustained - What is current hemodynamic status? Tenuous   Miscellaneous Information:  - Labs sent, including: BMP, CBC, Lactate,   - Primary team notified?  Yes  - Family Notified? No  - Additional notes/ transfer status: Transfer to ICU        Everrett Coombe, MD  12/22/2015, 8:51 PM

## 2015-12-19 NOTE — Progress Notes (Signed)
At approximately 2000, assisted patient to the bathroom. While in the room with patient, telemetry monitor showed a rhythm of Vtach, rate 108. Patient was asymptomatic with no complaints of dizziness, pain, or shortness of breath. Assisted the patient back to bed. At this time, patient began to develop shortness of breath. While attempting to obtain an EKG, patient became unresponsive and was without a pulse. A code blue was called. CPR was started. Patient was observed to be in pulseless Vtach; 1 shock was delivered at which time patient's rhythm changed to AV paced, a pulse was felt, and he became responsive and began to follow commands. Internal medicine MD at the bedside; Dr. Radford Pax on call for cardiology paged. Patient was transferred to [place]. Report given to receiving RN. Patient's wife, daughter, and son were notified of patient's transfer.

## 2015-12-19 NOTE — ED Notes (Signed)
[employer] called and spoke with RN about AICD events. The company reports no events with pacemaker. EDP made aware.

## 2015-12-20 ENCOUNTER — Inpatient Hospital Stay (HOSPITAL_COMMUNITY): Payer: Medicare Other

## 2015-12-20 DIAGNOSIS — I472 Ventricular tachycardia: Principal | ICD-10-CM

## 2015-12-20 DIAGNOSIS — I5023 Acute on chronic systolic (congestive) heart failure: Secondary | ICD-10-CM

## 2015-12-20 LAB — COOXEMETRY PANEL
CARBOXYHEMOGLOBIN: 0.8 % (ref 0.5–1.5)
CARBOXYHEMOGLOBIN: 0.9 % (ref 0.5–1.5)
CARBOXYHEMOGLOBIN: 0.6 % (ref 0.5–1.5)
Methemoglobin: 0.8 % (ref 0.0–1.5)
Methemoglobin: 1.1 % (ref 0.0–1.5)
Methemoglobin: 1.3 % (ref 0.0–1.5)
O2 SAT: 52.3 %
O2 SAT: 45.7 %
O2 SAT: 50.3 %
TOTAL HEMOGLOBIN: 10.8 g/dL — AB (ref 12.0–16.0)
Total hemoglobin: 13.7 g/dL (ref 12.0–16.0)
Total hemoglobin: 13.2 g/dL (ref 12.0–16.0)

## 2015-12-20 LAB — BASIC METABOLIC PANEL
Anion gap: 5 (ref 5–15)
BUN: 33 mg/dL — AB (ref 6–20)
CALCIUM: 9.2 mg/dL (ref 8.9–10.3)
CHLORIDE: 104 mmol/L (ref 101–111)
CO2: 28 mmol/L (ref 22–32)
CREATININE: 2.76 mg/dL — AB (ref 0.61–1.24)
GFR calc non Af Amer: 20 mL/min — ABNORMAL LOW (ref >60)
GFR, EST AFRICAN AMERICAN: 23 mL/min — AB (ref >60)
Glucose, Bld: 135 mg/dL — ABNORMAL HIGH (ref 65–99)
Potassium: 4.3 mmol/L (ref 3.5–5.1)
SODIUM: 137 mmol/L (ref 135–145)

## 2015-12-20 LAB — TROPONIN I: Troponin I: 0.7 ng/mL (ref <0.03)

## 2015-12-20 LAB — PROTIME-INR
INR: 2.69
PROTHROMBIN TIME: 29.1 s — AB (ref 11.4–15.2)

## 2015-12-20 LAB — MRSA PCR SCREENING: MRSA by PCR: NEGATIVE

## 2015-12-20 MED ORDER — FUROSEMIDE 10 MG/ML IJ SOLN
120.0000 mg | Freq: Two times a day (BID) | INTRAVENOUS | Status: DC
  Administered 2015-12-20: 120 mg via INTRAVENOUS
  Filled 2015-12-20 (×4): qty 12

## 2015-12-20 MED ORDER — DEXTROSE 5 % IV SOLN
120.0000 mg | Freq: Once | INTRAVENOUS | Status: AC
  Administered 2015-12-20: 120 mg via INTRAVENOUS
  Filled 2015-12-20: qty 12

## 2015-12-20 MED ORDER — FUROSEMIDE 10 MG/ML IJ SOLN
80.0000 mg | Freq: Once | INTRAMUSCULAR | Status: AC
  Administered 2015-12-20: 80 mg via INTRAVENOUS
  Filled 2015-12-20: qty 8

## 2015-12-20 MED ORDER — MILRINONE LACTATE IN DEXTROSE 20-5 MG/100ML-% IV SOLN
0.2500 ug/kg/min | INTRAVENOUS | Status: DC
Start: 2015-12-20 — End: 2015-12-21
  Administered 2015-12-20: 0.375 ug/kg/min via INTRAVENOUS
  Administered 2015-12-20 – 2015-12-21 (×2): 0.25 ug/kg/min via INTRAVENOUS
  Filled 2015-12-20 (×4): qty 100

## 2015-12-20 MED ORDER — NOREPINEPHRINE BITARTRATE 1 MG/ML IV SOLN
0.0000 ug/min | INTRAVENOUS | Status: DC
  Filled 2015-12-20: qty 16

## 2015-12-20 MED FILL — Medication: Qty: 1 | Status: AC

## 2015-12-20 NOTE — Progress Notes (Signed)
   Called by nursing staff for increased WOB.   CO-OX 52%. Given IV lasix this morning. Sluggish urine output.  CVP 12-13. Now requiring 40% oxygen. Extremities cool.   Start milrinone 0.25 mcg with norepi at bedside.  Give another 120  mg IV lasix now.    Dr Aundra Dubin at bedside to discuss goals of care.   Amy Clegg NP-C  1:14 PM  Patient seen with NP, agree with the above note. He has increased work of breathing and is tachypneic.  Co-ox low, cool extremities suggesting low output.   - Started milrinone, will then give Lasix 120 mg IV x 1.   - Place on Bipap.  I discussed possible need for intubation with family.  They want intubation if needed with 2-3 day trial of therapy while intubated. If no progress would then move towards comfort measures.   [name] 12/20/2015 1:44 PM

## 2015-12-20 NOTE — Procedures (Signed)
Central Venous Catheter Insertion Procedure Note Sean [name] [id number] [birth year]  Procedure: Insertion of Central Venous Catheter Indications: Drug and/or fluid administration and Frequent blood sampling  Procedure Details Consent: Risks of procedure as well as the alternatives and risks of each were explained to the (patient/caregiver).  Consent for procedure obtained. Time Out: Verified patient identification, verified procedure, site/side was marked, verified correct patient position, special equipment/implants available, medications/allergies/relevent history reviewed, required imaging and test results available.  Performed  Maximum sterile technique was used including antiseptics, cap, gloves, gown, hand hygiene, mask and sheet. Skin prep: Chlorhexidine; local anesthetic administered A antimicrobial bonded/coated triple lumen catheter was placed in the right internal jugular vein using the Seldinger technique.  Evaluation Blood flow good Complications: No apparent complications Patient did tolerate procedure well. Chest X-ray ordered to verify placement.  CXR: pending.  Sean [name] 12/20/2015, 10:01 AM   Unable ffp prior, would push to resp failure Risk bleeding noted Sean [name]  Sean [name]. Sean Mould, MD, [place] Pgr: [facility]

## 2015-12-20 NOTE — Progress Notes (Signed)
Advanced Heart Failure Rounding Note   Subjective:    Admitted pyre syncope/syncope. Device interrogation was negative for arrhythmias.   Over night VT arrest but slow VT and did not have defibrillation. Shock x1 and 1 minutes CPR. Started on amio drip + lidocaine drip. Increased dyspnea this morning oxygen increased to 4 liters. Tachypneic.   SOB at rest.    Objective:   Weight Range:  Vital Signs:   Temp:  [97.6 F (36.4 C)-98.7 F (37.1 C)] 97.8 F (36.6 C) (11/16 0300) Pulse Rate:  [58-79] 66 (11/16 0700) Resp:  [14-31] 22 (11/16 0700) BP: (75-126)/(38-100) 113/100 (11/16 0700) SpO2:  [90 %-100 %] 91 % (11/16 0700) Weight:  [180 lb 9.6 oz (81.9 kg)-187 lb (84.8 kg)] 187 lb (84.8 kg) (11/16 0300) Last BM Date: 12/15/2015  Weight change: Filed Weights   12/12/2015 0746 12/10/2015 1429 12/20/15 0300  Weight: 180 lb 9.6 oz (81.9 kg) 182 lb (82.6 kg) 187 lb (84.8 kg)    Intake/Output:   Intake/Output Summary (Last 24 hours) at 12/20/15 0744 Last data filed at 12/20/15 0700  Gross per 24 hour  Intake            645.8 ml  Output                0 ml  Net            645.8 ml     Physical Exam: General: Dyspneic at rest.  HEENT: normal Neck: supple. JVP to jaw . Carotids 2+ bilat; no bruits. No lymphadenopathy or thryomegaly appreciated. Cor: PMI nondisplaced. Regular rate & rhythm. No rubs, gallops or murmurs. Zoll pads in place.  Lungs: EW. 4 liters [place]  Abdomen: soft, nontender, nondistended. No hepatosplenomegaly. No bruits or masses. Good bowel sounds. Extremities: no cyanosis, clubbing, rash, R and LLE 1+ edema. Extremities cool.  Neuro: alert & orientedx2 , cranial nerves grossly intact. moves all 4 extremities w/o difficulty. Affect pleasant  Telemetry: A paced V sensed 70 s  Labs: Basic Metabolic Panel:  Recent Labs Lab 12/14/15 1005 12/08/2015 0759 12/27/2015 2036 12/21/2015 2113 12/20/15 0230  NA 137 137 136 137 137  K 4.0 3.8 3.6 4.7 4.3  CL 101 101  105 103 104  CO2 27 26 22 26 28   GLUCOSE 109* 173* 125* 170* 135*  BUN 36* 36* 33* 34* 33*  CREATININE 2.61* 3.23* 2.64* 2.94* 2.76*  CALCIUM 9.6 9.2 9.0 9.4 9.2  MG  --   --  2.2 2.1  --     Liver Function Tests:  Recent Labs Lab 12/14/15 1005 01/02/2016 0759  AST 33 27  ALT 18 18  ALKPHOS 27* 26*  BILITOT 1.0 0.9  PROT 7.0 6.5  ALBUMIN 3.9 3.5   No results for input(s): LIPASE, AMYLASE in the last 168 hours. No results for input(s): AMMONIA in the last 168 hours.  CBC:  Recent Labs Lab 12/08/2015 0759 12/05/2015 2036 12/22/2015 2113  WBC 5.1 8.1 8.6  NEUTROABS 3.6  --   --   HGB 11.9* 12.0* 12.1*  HCT 36.5* 36.6* 37.0*  MCV 88.8 90.4 89.8  PLT 150 143* 148*    Cardiac Enzymes:  Recent Labs Lab 12/15/2015 1541 12/26/2015 2036 12/20/15 0230  TROPONINI 0.19* 0.27* 0.70*    BNP: BNP (last 3 results)  Recent Labs  03/15/15 0548 10/17/15 1349 12/14/15 1005  BNP 441.9* 591.8* 779.2*    ProBNP (last 3 results) No results for input(s): PROBNP in the last  8760 hours.    Other results:  Imaging: Ct Head Wo Contrast  Result Date: 12/26/2015 CLINICAL DATA:  Syncope today.  Confusion. EXAM: CT HEAD WITHOUT CONTRAST TECHNIQUE: Contiguous axial images were obtained from the base of the skull through the vertex without intravenous contrast. COMPARISON:  None. FINDINGS: Brain: There is cortical atrophy and chronic microvascular ischemic change. No evidence of acute abnormality including hemorrhage, infarct, mass lesion, mass effect, midline shift or abnormal extra-axial fluid collection. No hydrocephalus or pneumocephalus. The calvarium is intact. Vascular: Atherosclerosis is noted. Skull: Intact. Sinuses/Orbits: Unremarkable. Other: None. IMPRESSION: No acute abnormality. Atrophy and chronic microvascular ischemic change. Electronically Signed   By: Inge Rise M.D.   On: 12/22/2015 13:48   Ct Chest Wo Contrast  Result Date: 12/08/2015 CLINICAL DATA:   Unwitnessed syncope. EXAM: CT CHEST WITHOUT CONTRAST TECHNIQUE: Multidetector CT imaging of the chest was performed following the standard protocol without IV contrast. COMPARISON:  Chest CT 10/05/2014 FINDINGS: Cardiovascular: Cardiomegaly without pericardial effusion. Extensive atherosclerotic calcifications status post CABG. Biventricular ICD/ pacer leads. Aortic valve calcification, mild for age. Mediastinum/Nodes: Negative for adenopathy. Lungs/Pleura: There is confluent airspace disease in the posterior right lower lobe measuring up to 7 cm. This is chronic based on chest x-rays from earlier in 2017. A small ground-glass area of opacity was present in this region on 2006 chest CT. Posterior left upper lobe subpleural nodule measuring up to 27 mm, along the upper margin of the major fissure, new from prior. Prior surgery in the right mid lung with sutures. Mild subpleural reticulation at the bases. Generalized airway thickening. Smaller peripheral pulmonary nodules which will be re- evaluated at follow-up. Upper Abdomen: No acute finding. Negative adrenals. Cholecystectomy. Musculoskeletal: Degenerative changes throughout the thoracic spine. No acute or aggressive finding. IMPRESSION: 1. 7 cm area of right lower lobe airspace disease, chronic based on radiography from earlier this year. A smaller ground-glass opacity was present in this region on 2006 chest CT. Constellation of findings is primarily concerning for adenocarcinoma. 2. 2.7 cm ground-glass nodule in the subpleural left upper lobe, also primarily concerning for neoplasm. 3. Recommend surgical referral. 4. History of syncope. Aortic valve calcification that is mild for age. Electronically Signed   By: Monte Fantasia M.D.   On: 12/10/2015 13:57   Dg Chest Port 1 View  Result Date: 12/22/2015 CLINICAL DATA:  Post resuscitation.  History of cardiomyopathy. EXAM: PORTABLE CHEST 1 VIEW COMPARISON:  01/03/2016 at 09:27 FINDINGS: There is unchanged  moderate cardiomegaly. The transvenous cardiac leads appear intact. The lungs are clear. Mild chronic right lateral costophrenic angle blunting is again evident. No large effusions. No pneumothorax. IMPRESSION: Unchanged cardiomegaly.  No acute cardiopulmonary findings. Electronically Signed   By: Andreas Newport M.D.   On: 12/05/2015 21:08   Dg Chest Port 1 View  Result Date: 12/31/2015 CLINICAL DATA:  Dizziness for a few days EXAM: PORTABLE CHEST 1 VIEW COMPARISON:  10/17/2015 FINDINGS: Persisting right perihilar opacity. This finding is chronic to a degree when compared to more remote imaging, but conspicuity is mildly increased over time. Postoperative right mid lung with chain sutures. Chronic blunting of the lateral right costophrenic sulcus. There is no edema, consolidation, effusion, or pneumothorax. Chronic cardiopericardial enlargement. The patient is status post CABG. Biventricular ICD/pacer from the right. No acute osseous finding. Chronic right rotator cuff tear with high humeral head. IMPRESSION: 1. Right perihilar opacity may be related to remote ipsilateral lung surgery, but is more conspicuous than on remote exams. Recommend nonemergent chest  CT follow-up, contrast not essential if there is contraindication. 2. No acute finding. Electronically Signed   By: Monte Fantasia M.D.   On: 12/28/2015 09:51      Medications:     Scheduled Medications: . amiodarone  100 mg Oral Daily  . atorvastatin  80 mg Oral q1800  . carvedilol  6.25 mg Oral BID WC  . donepezil  10 mg Oral QHS  . furosemide  80 mg Intravenous Once  . isosorbide mononitrate  120 mg Oral Daily  . pantoprazole  40 mg Oral Daily  . ranolazine  1,000 mg Oral BID  . sodium chloride flush  3 mL Intravenous Q12H  . Warfarin - Pharmacist Dosing Inpatient   Does not apply q1800     Infusions: . amiodarone 30 mg/hr (12/20/15 0536)  . lidocaine 1 mg/min (12/20/15 0000)     PRN Medications:  sodium chloride,  acetaminophen, ondansetron (ZOFRAN) IV, sodium chloride flush   Assessment/Plan/Discussion   82 yo with CAD s/p CABG, CKD stage III, ischemic cardiomyopathy, h/o VT presented with presyncope.  BP has been low and he has been lightheaded for a few weeks now, worse recently.  1. VT arrest 12/14/2015: Shock x1 but externally and not by device as VT was slow and not detected. Started on amio drip + lidocaine. K 4.3 Mag 2.1.  I suspect that his presenting syncopal event may have been slow VT that was not picked up by device.  - Continue amiodarone gtt and lidocaine gtt for now.  2. Presyncope/syncope: Occurred prior to admit. Creatinine on admit 3.23.  Wife states he has been more confused.  Initially though orthostatic symptoms with over-diuresis as ICD interrogation in the ED did not show VT or atrial fibrillation.  However, could have been slow VT episode like last night that was not detected by device. 3. Confusion: Baseline dementia. Thought to be worse due to low BP/cerebral hypoperfusion.  CT head with no acute findings.  4. CAD: s/p CABG. Chronic stable angina, no recent changes.  No chest pain prior to this admission.  TnI mildly elevated to 0.7, suspect that this may be demand ischemia due to hypotension with arrest.  - Continue ranolazine, Imdur, statin.  - He does not need aspirin as he has stable CAD and is on warfarin.  4. Acute on chronic systolic CHF: Ischemic cardiomyopathy. EF 25-30% on last echo. He has a [employer] CRT-D device. Volume status on admit was low but now overloaded post arrest and resuscitation. Cool extremities are suggestive of low output.  - Give 80 mg IV lasix now.  - Losartan held on admit .  - Continue Coreg to 6.25 mg bid given VT. - Placing CVL, will check CVP and co-ox. If output is low, may need careful use of inotrope along with anti-arrhythmics.   - CXR now 5. Hyperlipidemia: Continue atorvastatin. 6. Mitral regurgitation: Severe on last echo, probably  ischemic MR.  7. Celiac artery stenosis: Severe celiac stenosis on abdominal dopplers. He does not describe abdominal angina. Continue to follow.  8. Paroxysmal atrial fibrillation: Patient is on warfarin. He is in NSR today. No recent atrial fibrillation on device interrogation. 9. AKI on CKD: Suspect prerenal vs ATN in setting of low BP/over-diuresis. Creatinine now trending down towards baseline.  10. Abnormal CXR: More prominent peri-hilar opacity.  CT of chest 7 cm area of right lower lobe airspace disease, chronic based on radiography from earlier this year. A smaller ground-glass opacity was present in this region  on 2006 chest CT. Constellation of findings is primarily concerning for adenocarcinoma. 2.7 cm ground-glass nodule in the subpleural left upper lobe, also primarily concerning for neoplasm. Radiology recommending surgical consult.  - Will discuss with wife, he is not likely to be a candidate for treatment/workup of lung neoplasm.    Consult CCM for central access for CVP and CO-OX. Will need to address goals of care. Dr Aundra Dubin aware and agrees with plan. He will contact his wife.   Length of Stay: 1   Amy Clegg NP-C  12/20/2015, 7:44 AM  Advanced Heart Failure Team Pager [phone] (M-F; 7a - 4p)  Please contact [facility] for night-coverage after hours (4p -7a ) and weekends on [email or web address]  Patient seen with NP, agree with the above note.  I am concerned that his syncopal event on the day prior to admission may have been due to VT that was too slow to pick up on device given event last night.  Post-arrest, he is no on amiodarone and lidocaine.  Extremities are cool and he is tachypneic.  He got 80 mg IV Lasix with minimal urine output.  I suspect low output failure now with volume overload.    Creatinine is better this morning, closer to baseline.   Place CVL, will check co-ox and CVP.  Will like need careful use of inotrope to improve cardiac output and allow diuresis.   Will get CXR this morning.    Long-term, he is not a good candidate for advanced therapies, home milrinone, etc with age and dementia.    CT chest yesterday concerning for lung neoplasm.  Will discuss with his wife.   45 minutes critical care time.   [name] 12/20/2015 9:56 AM

## 2015-12-20 NOTE — Progress Notes (Signed)
ANTICOAGULATION CONSULT NOTE - Initial Consult  Pharmacy Consult for heparin when INR < 2 Indication: atrial fibrillation  Allergies  Allergen Reactions  . Carisoprodol     REACTION: rash  . Lisinopril     REACTION: cough  . Tape     Cloth Tape tears skin  . Zolpidem Tartrate Other (See Comments)    Altered mental status  . Penicillins Rash    Has patient had a PCN reaction causing immediate rash, facial/tongue/throat swelling, SOB or lightheadedness with hypotension:YES Has patient had a PCN reaction causing severe rash involving mucus membranes or skin necrosis: NO Has patient had a PCN reaction that required hospitalization NO Has patient had a PCN reaction occurring within the last 10 years: NO If all of the above answers are "NO", then may proceed with Cephalosporin use.    Patient Measurements: Height: 6\' 1"  (185.4 cm) Weight: 187 lb (84.8 kg) IBW/kg (Calculated) : 79.9 Heparin Dosing Weight: 82.6  Vital Signs: Temp: 97.3 F (36.3 C) (11/16 1100) Temp Source: Axillary (11/16 1100) BP: 128/68 (11/16 1400) Pulse Rate: 67 (11/16 1400)  Labs:  Recent Labs  12/12/2015 0759 01/03/2016 1541 12/14/2015 2036 12/18/2015 2113 12/20/15 0230  HGB 11.9*  --  12.0* 12.1*  --   HCT 36.5*  --  36.6* 37.0*  --   PLT 150  --  143* 148*  --   LABPROT 35.1*  --   --   --  29.1*  INR 3.39  --   --   --  2.69  CREATININE 3.23*  --  2.64* 2.94* 2.76*  TROPONINI  --  0.19* 0.27*  --  0.70*    Estimated Creatinine Clearance: 23.3 mL/min (by C-G formula based on SCr of 2.76 mg/dL (H)).   Medical History: Past Medical History:  Diagnosis Date  . Atrial fibrillation ([place]) 04/04/2010  . AV BLOCK 05/22/2008  . CAD, ARTERY BYPASS GRAFT 05/22/2008  . CARDIOMYOPATHY, ISCHEMIC 05/22/2008  . CAROTID ARTERY STENOSIS 11/15/2009  . CHF (congestive heart failure) ([facility])    class 2 to 3  . Chronic systolic heart failure ([place]) 10/13/2008  . COLONIC POLYPS, HX OF 04/07/2008  . Decreased left  ventricular function   . DEGENERATIVE JOINT DISEASE, LEFT KNEE 04/07/2008  . DIVERTICULITIS, HX OF 04/07/2008  . GERD 04/07/2008  . HYPERLIPIDEMIA 04/07/2008  . HYPERTENSION 04/07/2008  . MITRAL VALVE PROLAPSE, NON-RHEUMATIC 05/22/2008  . RENAL INSUFFICIENCY 10/17/2008  . Shortness of breath dyspnea   . Ventricular tachycardia ([place])    Status post catheter ablation may 2013   Assessment: 82 yo male with Hx of Afib s/p VT arrest on amio and lido gtts. Patient w/ significant increased work of breathing, now requiring 40% oxygen. Warfarin being held in anticipation of possible procedures/intubation with plans to start heparin when INR < 2. INR 2.69.   Goal of Therapy:  Heparin level 0.3-0.7 units/ml Monitor platelets by anticoagulation protocol: Yes   Plan:  Hold anticoagulation at this time Follow up AM INR Daily INR, CBC  Melburn Popper 12/20/2015,2:51 PM

## 2015-12-20 NOTE — Progress Notes (Signed)
SUBJECTIVE: The patient is short of breath at rest this morning. VT arrest last night requiring external defibrillation  CURRENT MEDICATIONS: . atorvastatin  80 mg Oral q1800  . carvedilol  6.25 mg Oral BID WC  . donepezil  10 mg Oral QHS  . isosorbide mononitrate  120 mg Oral Daily  . pantoprazole  40 mg Oral Daily  . ranolazine  1,000 mg Oral BID  . sodium chloride flush  3 mL Intravenous Q12H   . amiodarone 30 mg/hr (12/20/15 0536)  . lidocaine 1 mg/min (12/20/15 0000)  . norepinephrine (LEVOPHED) Adult infusion      OBJECTIVE: Physical Exam: Vitals:   12/20/15 0400 12/20/15 0500 12/20/15 0600 12/20/15 0700  BP: 114/62 119/62 126/75 (!) 113/100  Pulse: 68 73 (!) 58 66  Resp: (!) 25 (!) 31 (!) 30 (!) 22  Temp:    97.4 F (36.3 C)  TempSrc:    Oral  SpO2: 90% 90% 91% 91%  Weight:      Height:        Intake/Output Summary (Last 24 hours) at 12/20/15 0919 Last data filed at 12/20/15 0700  Gross per 24 hour  Intake            645.8 ml  Output                0 ml  Net            645.8 ml    Telemetry reveals sinus rhythm with V pacing, ventricular tachycardia around 120bpm last night requiring AED shock  GEN- The patient is ill appearing, alert and oriented x 3 today.   Head- normocephalic, atraumatic Eyes-  Sclera clear, conjunctiva pink Ears- hearing intact Oropharynx- clear Neck- supple, +JVP Lungs- Tachypnea, clear to ausculation bilaterally  Heart- Regular rate and rhythm, no murmurs, rubs or gallops  GI- soft, NT, ND, + BS Extremities- no clubbing, cyanosis, or edema Skin- no rash or lesion Psych- euthymic mood, full affect Neuro- strength and sensation are intact  LABS: Basic Metabolic Panel:  Recent Labs  12/13/2015 2036 12/30/2015 2113 12/20/15 0230  NA 136 137 137  K 3.6 4.7 4.3  CL 105 103 104  CO2 22 26 28   GLUCOSE 125* 170* 135*  BUN 33* 34* 33*  CREATININE 2.64* 2.94* 2.76*  CALCIUM 9.0 9.4 9.2  MG 2.2 2.1  --    Liver Function  Tests:  Recent Labs  12/18/2015 0759  AST 27  ALT 18  ALKPHOS 26*  BILITOT 0.9  PROT 6.5  ALBUMIN 3.5   CBC:  Recent Labs  12/20/2015 0759 12/15/2015 2036 12/26/2015 2113  WBC 5.1 8.1 8.6  NEUTROABS 3.6  --   --   HGB 11.9* 12.0* 12.1*  HCT 36.5* 36.6* 37.0*  MCV 88.8 90.4 89.8  PLT 150 143* 148*   Cardiac Enzymes:  Recent Labs  12/25/2015 1541 12/06/2015 2036 12/20/15 0230  TROPONINI 0.19* 0.27* 0.70*    RADIOLOGY: Ct Head Wo Contrast Result Date: 12/17/2015 CLINICAL DATA:  Syncope today.  Confusion. EXAM: CT HEAD WITHOUT CONTRAST TECHNIQUE: Contiguous axial images were obtained from the base of the skull through the vertex without intravenous contrast. COMPARISON:  None. FINDINGS: Brain: There is cortical atrophy and chronic microvascular ischemic change. No evidence of acute abnormality including hemorrhage, infarct, mass lesion, mass effect, midline shift or abnormal extra-axial fluid collection. No hydrocephalus or pneumocephalus. The calvarium is intact. Vascular: Atherosclerosis is noted. Skull: Intact. Sinuses/Orbits: Unremarkable. Other: None. IMPRESSION: No acute  abnormality. Atrophy and chronic microvascular ischemic change. Electronically Signed   By: Inge Rise M.D.   On: 12/30/2015 13:48   Dg Chest Port 1 View Result Date: 12/25/2015 CLINICAL DATA:  Post resuscitation.  History of cardiomyopathy. EXAM: PORTABLE CHEST 1 VIEW COMPARISON:  12/21/2015 at 09:27 FINDINGS: There is unchanged moderate cardiomegaly. The transvenous cardiac leads appear intact. The lungs are clear. Mild chronic right lateral costophrenic angle blunting is again evident. No large effusions. No pneumothorax. IMPRESSION: Unchanged cardiomegaly.  No acute cardiopulmonary findings. Electronically Signed   By: Andreas Newport M.D.   On: 12/10/2015 21:08    ASSESSMENT AND PLAN:  Active Problems:   Syncope  1.  Ventricular tachycardia The patient presented with syncope with no arrhythmias on  device interrogation. He had recurrent syncope last night with VT below his detection requiring external defibrillation. Currently on IV amiodaorne and lidocaine.  Will reprogram device to lower VT detection Keep K >3.9, Mg >1.8  2.  Acute on chronic systolic heart failure Management per AHF team  3.  Severe MR  4.  CAD s/p CABG No recent ischemic symptoms  Will need to address goals of care. Will have Dr Caryl Comes see patient tomorrow who knows him well.   Chanetta Marshall, NP 12/20/2015 10:51 AM  EP Attending  Patient seen and examined. Agree with above. He remains critically ill with a low output state. I have reviewed the biV ICD interrogation with [employer] with [employer]. His device is working normally. His VT was below his cutoff and did not deliver therapy as his VT rate was at 170. He has had his device reprogrammed under my direction. He has ATP therapy at 120 and standard VT/VF therapy turned on at Hr's above 170. He has IV amio and lidocaine going as well. He will need aggressive heart failure treatment. Prognosis is guarded/poor.  Mikle Bosworth.D.

## 2015-12-20 NOTE — Progress Notes (Signed)
Pt unable to void after Lasix 80mg  IV given at 0845 this am. Bladder scan showed 350cc urine in bladder. Order received from Dr Aundra Dubin to insert indwelling Foley catheter. CAUTI RN team to insert.

## 2015-12-20 NOTE — Care Management Note (Signed)
Case Management Note  Patient Details  Name: Sean [name] MRN: [id number] Date of Birth: Nov 28, 1931  Subjective/Objective:  Adm w syncope and vt, iv amiodarone                  Action/Plan:control arrthymia  Expected Discharge Date:                  Expected Discharge Plan:  [place]  In-House Referral:     Discharge planning Services  CM Consult  Post Acute Care Choice:    Choice offered to:     DME Arranged:    DME Agency:     HH Arranged:    HH Agency:     Status of Service:  In process, will continue to follow  If discussed at Long Length of Stay Meetings, dates discussed:    Additional Comments: lives w fam, pcp dr Elease Hashimoto, will follow for dc needs as pt progresses  Lacretia Leigh, RN 12/20/2015, 1:59 PM

## 2015-12-21 ENCOUNTER — Inpatient Hospital Stay (HOSPITAL_COMMUNITY): Payer: Medicare Other

## 2015-12-21 DIAGNOSIS — I459 Conduction disorder, unspecified: Secondary | ICD-10-CM

## 2015-12-21 LAB — CBC
HCT: 36 % — ABNORMAL LOW (ref 39.0–52.0)
Hemoglobin: 11.9 g/dL — ABNORMAL LOW (ref 13.0–17.0)
MCH: 28.8 pg (ref 26.0–34.0)
MCHC: 33.1 g/dL (ref 30.0–36.0)
MCV: 87.2 fL (ref 78.0–100.0)
PLATELETS: 147 10*3/uL — AB (ref 150–400)
RBC: 4.13 MIL/uL — ABNORMAL LOW (ref 4.22–5.81)
RDW: 15.6 % — AB (ref 11.5–15.5)
WBC: 9.6 10*3/uL (ref 4.0–10.5)

## 2015-12-21 LAB — COOXEMETRY PANEL
Carboxyhemoglobin: 0.6 % (ref 0.5–1.5)
Methemoglobin: 1.3 % (ref 0.0–1.5)
O2 SAT: 72.3 %
TOTAL HEMOGLOBIN: 12.3 g/dL (ref 12.0–16.0)

## 2015-12-21 LAB — BASIC METABOLIC PANEL
Anion gap: 12 (ref 5–15)
BUN: 40 mg/dL — AB (ref 6–20)
CHLORIDE: 99 mmol/L — AB (ref 101–111)
CO2: 25 mmol/L (ref 22–32)
Calcium: 8.8 mg/dL — ABNORMAL LOW (ref 8.9–10.3)
Creatinine, Ser: 2.85 mg/dL — ABNORMAL HIGH (ref 0.61–1.24)
GFR calc Af Amer: 22 mL/min — ABNORMAL LOW (ref >60)
GFR calc non Af Amer: 19 mL/min — ABNORMAL LOW (ref >60)
Glucose, Bld: 133 mg/dL — ABNORMAL HIGH (ref 65–99)
POTASSIUM: 3.4 mmol/L — AB (ref 3.5–5.1)
SODIUM: 136 mmol/L (ref 135–145)

## 2015-12-21 LAB — MAGNESIUM: MAGNESIUM: 2 mg/dL (ref 1.7–2.4)

## 2015-12-21 LAB — PROTIME-INR
INR: 3.53
PROTHROMBIN TIME: 36.2 s — AB (ref 11.4–15.2)

## 2015-12-21 MED ORDER — MEXILETINE HCL 200 MG PO CAPS
200.0000 mg | ORAL_CAPSULE | Freq: Three times a day (TID) | ORAL | Status: DC
  Administered 2015-12-21: 200 mg via ORAL
  Filled 2015-12-21: qty 1

## 2015-12-21 MED ORDER — HALOPERIDOL LACTATE 5 MG/ML IJ SOLN
INTRAMUSCULAR | Status: AC
  Administered 2015-12-21: 5 mg
  Filled 2015-12-21: qty 1

## 2015-12-21 MED ORDER — MORPHINE SULFATE (PF) 2 MG/ML IV SOLN
1.0000 mg | INTRAVENOUS | Status: DC | PRN
  Administered 2015-12-21: 2 mg via INTRAVENOUS
  Filled 2015-12-21: qty 1

## 2015-12-21 MED ORDER — ALPRAZOLAM 0.25 MG PO TABS
0.2500 mg | ORAL_TABLET | Freq: Once | ORAL | Status: AC
  Administered 2015-12-21: 0.25 mg via ORAL
  Filled 2015-12-21: qty 1

## 2015-12-21 MED ORDER — SODIUM CHLORIDE 0.9 % IV SOLN
2.0000 mg/h | INTRAVENOUS | Status: DC
  Administered 2015-12-22: 1 mg/h via INTRAVENOUS
  Filled 2015-12-21: qty 10

## 2015-12-21 MED ORDER — FUROSEMIDE 10 MG/ML IJ SOLN
80.0000 mg | Freq: Once | INTRAMUSCULAR | Status: AC
  Administered 2015-12-21: 80 mg via INTRAVENOUS
  Filled 2015-12-21: qty 8

## 2015-12-21 MED ORDER — POTASSIUM CHLORIDE CRYS ER 20 MEQ PO TBCR
40.0000 meq | EXTENDED_RELEASE_TABLET | Freq: Once | ORAL | Status: AC
  Administered 2015-12-21: 40 meq via ORAL
  Filled 2015-12-21: qty 2

## 2015-12-21 NOTE — Progress Notes (Signed)
Advanced Heart Failure Rounding Note   Subjective:    Admitted pre syncope/syncope. Device interrogation was negative for arrhythmias.   12/20/15 VT arrest but slow VT and did not have defibrillation. Shock x1 and 1 minutes CPR. Started on amio drip + lidocaine drip.   Yesterday became short of breath, required Bipap.  Diuresed with IV lasix. Weight down 6 pounds. Started on milrinone 0.25 mcg. Initial CO-OX 45%. Milrinone was increased to 0.375 mcg.  Co-ox 72% now, CVP down to 4-5.     Confused overnight.    Objective:   Weight Range:  Vital Signs:   Temp:  [97.3 F (36.3 C)-98.6 F (37 C)] 98.3 F (36.8 C) (11/17 0757) Pulse Rate:  [29-156] 76 (11/17 0600) Resp:  [16-29] 23 (11/17 0600) BP: (97-133)/(55-83) 106/56 (11/17 0600) SpO2:  [75 %-100 %] 100 % (11/17 0600) FiO2 (%):  [40 %-50 %] 40 % (11/17 0000) Weight:  [181 lb 10.5 oz (82.4 kg)] 181 lb 10.5 oz (82.4 kg) (11/17 0300) Last BM Date: 12/15/2015  Weight change: Filed Weights   12/11/2015 1429 12/20/15 0300 12/21/15 0300  Weight: 182 lb (82.6 kg) 187 lb (84.8 kg) 181 lb 10.5 oz (82.4 kg)    Intake/Output:   Intake/Output Summary (Last 24 hours) at 12/21/15 0758 Last data filed at 12/21/15 0700  Gross per 24 hour  Intake          1333.87 ml  Output             2125 ml  Net          -791.13 ml     Physical Exam: CVP 4-5 General: On Bipap.  HEENT: normal Neck: supple. JVP difficult to assess with bipap in place. Carotids 2+ bilat; no bruits. No lymphadenopathy or thryomegaly appreciated. Cor: PMI nondisplaced. Regular rate & rhythm. No rubs, gallops or murmurs. Zoll pads in place.  Lungs: Decreased in the bases. EW. On bipap Abdomen: soft, nontender, nondistended. No hepatosplenomegaly. No bruits or masses. Good bowel sounds. Extremities: no cyanosis, clubbing, rash, R and LLE trace  edema. Extremities warm. R and L hand soft mittens in place.  Neuro: Oriented to self. Combative.   Telemetry: A paced V  sensed 70 s  Labs: Basic Metabolic Panel:  Recent Labs Lab 01/02/2016 0759 12/10/2015 2036 12/15/2015 2113 12/20/15 0230 12/21/15 0330  NA 137 136 137 137 136  K 3.8 3.6 4.7 4.3 3.4*  CL 101 105 103 104 99*  CO2 26 22 26 28 25   GLUCOSE 173* 125* 170* 135* 133*  BUN 36* 33* 34* 33* 40*  CREATININE 3.23* 2.64* 2.94* 2.76* 2.85*  CALCIUM 9.2 9.0 9.4 9.2 8.8*  MG  --  2.2 2.1  --  2.0    Liver Function Tests:  Recent Labs Lab 12/14/15 1005 12/22/2015 0759  AST 33 27  ALT 18 18  ALKPHOS 27* 26*  BILITOT 1.0 0.9  PROT 7.0 6.5  ALBUMIN 3.9 3.5   No results for input(s): LIPASE, AMYLASE in the last 168 hours. No results for input(s): AMMONIA in the last 168 hours.  CBC:  Recent Labs Lab 12/18/2015 0759 12/05/2015 2036 01/01/2016 2113 12/21/15 0330  WBC 5.1 8.1 8.6 9.6  NEUTROABS 3.6  --   --   --   HGB 11.9* 12.0* 12.1* 11.9*  HCT 36.5* 36.6* 37.0* 36.0*  MCV 88.8 90.4 89.8 87.2  PLT 150 143* 148* 147*    Cardiac Enzymes:  Recent Labs Lab 12/18/2015 1541 01/02/2016 2036  12/20/15 0230  TROPONINI 0.19* 0.27* 0.70*    BNP: BNP (last 3 results)  Recent Labs  03/15/15 0548 10/17/15 1349 12/14/15 1005  BNP 441.9* 591.8* 779.2*    ProBNP (last 3 results) No results for input(s): PROBNP in the last 8760 hours.    Other results:  Imaging: Ct Head Wo Contrast  Result Date: 12/13/2015 CLINICAL DATA:  Syncope today.  Confusion. EXAM: CT HEAD WITHOUT CONTRAST TECHNIQUE: Contiguous axial images were obtained from the base of the skull through the vertex without intravenous contrast. COMPARISON:  None. FINDINGS: Brain: There is cortical atrophy and chronic microvascular ischemic change. No evidence of acute abnormality including hemorrhage, infarct, mass lesion, mass effect, midline shift or abnormal extra-axial fluid collection. No hydrocephalus or pneumocephalus. The calvarium is intact. Vascular: Atherosclerosis is noted. Skull: Intact. Sinuses/Orbits: Unremarkable.  Other: None. IMPRESSION: No acute abnormality. Atrophy and chronic microvascular ischemic change. Electronically Signed   By: Inge Rise M.D.   On: 12/21/2015 13:48   Ct Chest Wo Contrast  Result Date: 12/07/2015 CLINICAL DATA:  Unwitnessed syncope. EXAM: CT CHEST WITHOUT CONTRAST TECHNIQUE: Multidetector CT imaging of the chest was performed following the standard protocol without IV contrast. COMPARISON:  Chest CT 10/05/2014 FINDINGS: Cardiovascular: Cardiomegaly without pericardial effusion. Extensive atherosclerotic calcifications status post CABG. Biventricular ICD/ pacer leads. Aortic valve calcification, mild for age. Mediastinum/Nodes: Negative for adenopathy. Lungs/Pleura: There is confluent airspace disease in the posterior right lower lobe measuring up to 7 cm. This is chronic based on chest x-rays from earlier in 2017. A small ground-glass area of opacity was present in this region on 2006 chest CT. Posterior left upper lobe subpleural nodule measuring up to 27 mm, along the upper margin of the major fissure, new from prior. Prior surgery in the right mid lung with sutures. Mild subpleural reticulation at the bases. Generalized airway thickening. Smaller peripheral pulmonary nodules which will be re- evaluated at follow-up. Upper Abdomen: No acute finding. Negative adrenals. Cholecystectomy. Musculoskeletal: Degenerative changes throughout the thoracic spine. No acute or aggressive finding. IMPRESSION: 1. 7 cm area of right lower lobe airspace disease, chronic based on radiography from earlier this year. A smaller ground-glass opacity was present in this region on 2006 chest CT. Constellation of findings is primarily concerning for adenocarcinoma. 2. 2.7 cm ground-glass nodule in the subpleural left upper lobe, also primarily concerning for neoplasm. 3. Recommend surgical referral. 4. History of syncope. Aortic valve calcification that is mild for age. Electronically Signed   By: Monte Fantasia M.D.   On: 12/28/2015 13:57   Dg Chest Port 1 View  Result Date: 12/20/2015 CLINICAL DATA:  Encounter for central line placement EXAM: PORTABLE CHEST 1 VIEW COMPARISON:  Earlier today FINDINGS: Right IJ central line has been placed with tip at the upper cavoatrial junction. No pneumothorax. No new mediastinal widening. Cardiopericardial enlargement. Status post CABG. Biventricular ICD/ pacer from the right in unremarkable position. Diffuse interstitial opacity consistent with pulmonary edema. Small right pleural effusion. Bilateral airspace disease as described on chest CT from yesterday. IMPRESSION: 1. New central line without adverse finding. 2. CHF that is stable from earlier today. Electronically Signed   By: Monte Fantasia M.D.   On: 12/20/2015 10:47   Dg Chest Port 1 View  Result Date: 12/20/2015 CLINICAL DATA:  Dyspnea, CHF, prior CABG, ischemic cardiomyopathy, cardiac dysrhythmia, remote history of smoking. EXAM: PORTABLE CHEST 1 VIEW COMPARISON:  Portable chest x-ray of December 19, 2015 FINDINGS: The lungs are adequately inflated. The interstitial markings are  increased diffusely. The pulmonary vascularity is engorged. The cardiac silhouette is enlarged. There is calcification in the wall of the aortic arch. There is small amount of pleural fluid versus atelectasis blunting the lateral costophrenic angle on the right. The ICD is in stable position. There are post CABG changes. An external pacemaker defibrillator pad is present on the left. IMPRESSION: CHF with worsening of pulmonary edema.  No acute pneumonia. Thoracic aortic atherosclerosis. Electronically Signed   By: David  Martinique M.D.   On: 12/20/2015 09:16   Dg Chest Port 1 View  Result Date: 12/11/2015 CLINICAL DATA:  Post resuscitation.  History of cardiomyopathy. EXAM: PORTABLE CHEST 1 VIEW COMPARISON:  12/26/2015 at 09:27 FINDINGS: There is unchanged moderate cardiomegaly. The transvenous cardiac leads appear intact. The  lungs are clear. Mild chronic right lateral costophrenic angle blunting is again evident. No large effusions. No pneumothorax. IMPRESSION: Unchanged cardiomegaly.  No acute cardiopulmonary findings. Electronically Signed   By: Andreas Newport M.D.   On: 01/02/2016 21:08   Dg Chest Port 1 View  Result Date: 12/17/2015 CLINICAL DATA:  Dizziness for a few days EXAM: PORTABLE CHEST 1 VIEW COMPARISON:  10/17/2015 FINDINGS: Persisting right perihilar opacity. This finding is chronic to a degree when compared to more remote imaging, but conspicuity is mildly increased over time. Postoperative right mid lung with chain sutures. Chronic blunting of the lateral right costophrenic sulcus. There is no edema, consolidation, effusion, or pneumothorax. Chronic cardiopericardial enlargement. The patient is status post CABG. Biventricular ICD/pacer from the right. No acute osseous finding. Chronic right rotator cuff tear with high humeral head. IMPRESSION: 1. Right perihilar opacity may be related to remote ipsilateral lung surgery, but is more conspicuous than on remote exams. Recommend nonemergent chest CT follow-up, contrast not essential if there is contraindication. 2. No acute finding. Electronically Signed   By: Monte Fantasia M.D.   On: 01/01/2016 09:51     Medications:     Scheduled Medications: . atorvastatin  80 mg Oral q1800  . donepezil  10 mg Oral QHS  . furosemide  120 mg Intravenous BID  . isosorbide mononitrate  120 mg Oral Daily  . pantoprazole  40 mg Oral Daily  . potassium chloride  40 mEq Oral Once  . ranolazine  1,000 mg Oral BID  . sodium chloride flush  3 mL Intravenous Q12H    Infusions: . amiodarone 30 mg/hr (12/20/15 2135)  . lidocaine 1 mg/min (12/20/15 1800)  . milrinone 0.375 mcg/kg/min (12/20/15 2341)  . norepinephrine (LEVOPHED) Adult infusion 5 mcg/min (12/20/15 0830)    PRN Medications: sodium chloride, acetaminophen, ondansetron (ZOFRAN) IV, sodium chloride  flush   Assessment/Plan/Discussion   83 yo with CAD s/p CABG, CKD stage III, ischemic cardiomyopathy, h/o VT presented with presyncope.  BP has been low and he has been lightheaded for a few weeks now, worse recently.  1. VT arrest 12/15/2015: Shock x1 but externally and not by device as VT was slow and not detected. Started on amio drip + lidocaine. K 4.3 Mag 2.1.  I suspect that his presenting syncopal event may have been slow VT that was not picked up by device.  - Amiodarone gtt increase to 60 mg. Stop lidocaine with neurological changes.   2. Presyncope/syncope: Occurred prior to admit. Creatinine on admit 3.23.  Wife states he has been more confused.  Initially though orthostatic symptoms with over-diuresis as ICD interrogation in the ED did not show VT or atrial fibrillation.  However, could have been slow VT  episode that was not detected by device. 3. Confusion: Delirium.  Baseline dementia. Thought to be worse due to low BP/cerebral hypoperfusion.  CT head with no acute findings. Will stop lidocaine in case this contributes.  4. CAD: s/p CABG. Chronic stable angina, no recent changes.  No chest pain prior to this admission.  TnI mildly elevated to 0.7, suspect that this may be demand ischemia due to hypotension with arrest.  - Continue Imdur, statin.  - He does not need aspirin as he has stable CAD and is on warfarin.  - Stop ranolazine for now with long QT.  4. Acute on chronic systolic CHF: Ischemic cardiomyopathy. EF 25-30% on last echo. He has a [employer] CRT-D device. Volume status on admit was low but now overloaded post arrest and resuscitation. Low output initially, started on milrinone.  Today's CO-OX 72%. CVP 4-5 after diuresis yesterday with IV Lasix.  - Decrease milrinone to 0.25.   - Hold IV Lasix today with CVP down.   - Losartan held on admit .  - Can restart Coreg possibly tomorrow if remains stable.  5. Hyperlipidemia: Continue atorvastatin. 6. Mitral  regurgitation: Severe on last echo, probably ischemic MR.  7. Celiac artery stenosis: Severe celiac stenosis on abdominal dopplers. He does not describe abdominal angina. Continue to follow.  8. Paroxysmal atrial fibrillation: He is in NSR today. No recent atrial fibrillation on device interrogation. INR 3.5, resume warfarin when INR has come down.  9. AKI on CKD: Suspect prerenal vs ATN in setting of low BP/over-diuresis. Creatinine now trending down towards baseline. Creatinine 2.8  10. Abnormal CXR: More prominent peri-hilar opacity.  CT of chest 7 cm area of right lower lobe airspace disease, chronic based on radiography from earlier this year. A smaller ground-glass opacity was present in this region on 2006 chest CT. Constellation of findings is primarily concerning for adenocarcinoma. 2.7 cm ground-glass nodule in the subpleural left upper lobe, also primarily concerning for neoplasm.     Length of Stay: 2   Amy Clegg NP-C  12/21/2015, 7:58 AM  Advanced Heart Failure Team Pager [phone] (M-F; 7a - 4p)  Please contact [facility] for night-coverage after hours (4p -7a ) and weekends on [email or web address]  Patient seen with NP, agree with the above note.  He is doing better today.  Delirium this morning but recognizes people.  No further VT.  CVP down to 4-5.  Co-ox 72%.  - Agree with stop lidocaine, decrease milrinone to 0.25, hold Lasix.   - Stop ranolazine with long QT.   35 minutes critical care time.   [name] 12/21/2015 8:25 AM

## 2015-12-21 NOTE — Progress Notes (Signed)
   Called by nursing staff for intermittent  VT and hypotension. Lidocaine off this morning. Remains on amio drip.   Currently out of VT. Appears stable. Family at bedside.   EP contacted. Plan to reprogram ICD.   12:02 PM [name] Np-C

## 2015-12-21 NOTE — Progress Notes (Signed)
Patient ID: Sean [name], male   DOB: 1931/04/25, 82 y.o.   MRN: [id number]  Patient had additional episode of slow VT today with fall in BP again.    Family meeting: Plan DNR/DNI.  Will turn off defibrillation function of device, leave pacing.    Increased work of breathing, CVP only 7 but will give Lasix 80 mg IV x 1 now.   Add mexiletine 200 mg tid.   [name] 12/21/2015 1:02 PM

## 2015-12-21 NOTE — Progress Notes (Signed)
Tachy therapies on ICD disabled after discussion with family and Dr Aundra Dubin - pt now DNR/DNI.  Chanetta Marshall, NP 12/21/2015 1:46 PM  Thompson Grayer, MD

## 2015-12-21 NOTE — Progress Notes (Addendum)
SUBJECTIVE: Shortness of breath improved on Bipap. With persistent confusion which his wife reported to nursing staff was baseline Agitated  Hemodynamics much improved .   CURRENT MEDICATIONS: . atorvastatin  80 mg Oral q1800  . donepezil  10 mg Oral QHS  . furosemide  120 mg Intravenous BID  . isosorbide mononitrate  120 mg Oral Daily  . pantoprazole  40 mg Oral Daily  . ranolazine  1,000 mg Oral BID  . sodium chloride flush  3 mL Intravenous Q12H   . amiodarone 30 mg/hr (12/20/15 2135)  . lidocaine 1 mg/min (12/20/15 1800)  . milrinone 0.375 mcg/kg/min (12/20/15 2341)  . norepinephrine (LEVOPHED) Adult infusion 5 mcg/min (12/20/15 0830)    OBJECTIVE: Physical Exam: Vitals:   12/21/15 0300 12/21/15 0400 12/21/15 0439 12/21/15 0500  BP: 111/63 (!) 116/59 (!) 116/59 (!) 113/56  Pulse: 72 71 69 70  Resp: 16 (!) 21 (!) 21 (!) 24  Temp:   98.6 F (37 C)   TempSrc:   Oral   SpO2: 100% 100% 97% 100%  Weight: 181 lb 10.5 oz (82.4 kg)     Height:        Intake/Output Summary (Last 24 hours) at 12/21/15 [id number] Last data filed at 12/21/15 0100  Gross per 24 hour  Intake            830.8 ml  Output             1800 ml  Net           -969.2 ml   CoOX 72   Telemetry Personally reviewed   sinus rhythm with V pacing  No overnight VT  GEN- The patient is ill appearing, alert to person not place Head- normocephalic, atraumatic Eyes-  Sclera clear, conjunctiva pink Ears- hearing intact Oropharynx- clear Neck- supple, +JVP Lungs- Normal work of breathing, +bipap, clear to ausculation bilaterally  Heart- Regular rate and rhythm, no murmurs, rubs or gallops  GI- soft, NT, ND, + BS Extremities- no clubbing, cyanosis, or edema Skin- no rash or lesion Psych- agiotated and combative Neuro- strength and sensation are intact  LABS: Basic Metabolic Panel:  Recent Labs  01/02/2016 2113 12/20/15 0230 12/21/15 0330  NA 137 137 136  K 4.7 4.3 3.4*  CL 103 104 99*  CO2 26 28  25   GLUCOSE 170* 135* 133*  BUN 34* 33* 40*  CREATININE 2.94* 2.76* 2.85*  CALCIUM 9.4 9.2 8.8*  MG 2.1  --  2.0   Liver Function Tests:  Recent Labs  01/03/2016 0759  AST 27  ALT 18  ALKPHOS 26*  BILITOT 0.9  PROT 6.5  ALBUMIN 3.5   CBC:  Recent Labs  12/09/2015 0759  12/24/2015 2113 12/21/15 0330  WBC 5.1  < > 8.6 9.6  NEUTROABS 3.6  --   --   --   HGB 11.9*  < > 12.1* 11.9*  HCT 36.5*  < > 37.0* 36.0*  MCV 88.8  < > 89.8 87.2  PLT 150  < > 148* 147*  < > = values in this interval not displayed. Cardiac Enzymes:  Recent Labs  12/17/2015 1541 12/21/2015 2036 12/20/15 0230  TROPONINI 0.19* 0.27* 0.70*    RADIOLOGY: Ct Head Wo Contrast Result Date: 12/29/2015 CLINICAL DATA:  Syncope today.  Confusion. EXAM: CT HEAD WITHOUT CONTRAST TECHNIQUE: Contiguous axial images were obtained from the base of the skull through the vertex without intravenous contrast. COMPARISON:  None. FINDINGS: Brain: There is cortical atrophy  and chronic microvascular ischemic change. No evidence of acute abnormality including hemorrhage, infarct, mass lesion, mass effect, midline shift or abnormal extra-axial fluid collection. No hydrocephalus or pneumocephalus. The calvarium is intact. Vascular: Atherosclerosis is noted. Skull: Intact. Sinuses/Orbits: Unremarkable. Other: None. IMPRESSION: No acute abnormality. Atrophy and chronic microvascular ischemic change. Electronically Signed   By: Inge Rise M.D.   On: 12/07/2015 13:48   Dg Chest Port 1 View Result Date: 12/13/2015 CLINICAL DATA:  Post resuscitation.  History of cardiomyopathy. EXAM: PORTABLE CHEST 1 VIEW COMPARISON:  12/31/2015 at 09:27 FINDINGS: There is unchanged moderate cardiomegaly. The transvenous cardiac leads appear intact. The lungs are clear. Mild chronic right lateral costophrenic angle blunting is again evident. No large effusions. No pneumothorax. IMPRESSION: Unchanged cardiomegaly.  No acute cardiopulmonary findings.  Electronically Signed   By: Andreas Newport M.D.   On: 12/28/2015 21:08    ASSESSMENT AND PLAN:  Active Problems:   Syncope  1.  Ventricular tachycardia The patient presented with syncope with no arrhythmias on device interrogation. He had recurrent syncope in hospital with VT below his detection requiring external defibrillation.  Device reprogrammed yesterday to lower VT detection Continue IV amiodarone today, change to po tomorrow Low threshold for stopping Lidocaine - will defer to Dr Caryl Comes Keep K >3.9, Mg >1.8  2.  Acute on chronic systolic heart failure Management per AHF team  3.  Severe MR  4.  CAD s/p CABG No recent ischemic symptoms  5.  Hypokalemia-new  Potassium repleted this morning  6.  Agitation and delerium new   Dr Caryl Comes to see this morning who knows him well. Will need to discuss goals of care with family regarding tachy therapies for device.   Chanetta Marshall, NP 12/21/2015 6:20 AM   Continue Amio Will stop lido as potential neurotoxicity decreasee milrinone with much improved coox Have given haldol for agitation and alerted his wife Unfortunately have added restraints because of hitting  Hospital agitation has occurred int he past-- chart reviewed and could not find that any neurosedatives have been tried ( or failed)   Discussed with heart failure MD/NP

## 2015-12-21 NOTE — Progress Notes (Signed)
Pt family requesting for Morphine drip for comfort paged Cardiologist waiting for his return call.

## 2015-12-21 NOTE — Progress Notes (Signed)
ANTICOAGULATION CONSULT NOTE - Initial Consult  Pharmacy Consult for heparin when INR < 2 Indication: atrial fibrillation  Allergies  Allergen Reactions  . Carisoprodol     REACTION: rash  . Lisinopril     REACTION: cough  . Tape     Cloth Tape tears skin  . Zolpidem Tartrate Other (See Comments)    Altered mental status  . Penicillins Rash    Has patient had a PCN reaction causing immediate rash, facial/tongue/throat swelling, SOB or lightheadedness with hypotension:YES Has patient had a PCN reaction causing severe rash involving mucus membranes or skin necrosis: NO Has patient had a PCN reaction that required hospitalization NO Has patient had a PCN reaction occurring within the last 10 years: NO If all of the above answers are "NO", then may proceed with Cephalosporin use.    Patient Measurements: Height: 6\' 1"  (185.4 cm) Weight: 181 lb 10.5 oz (82.4 kg) IBW/kg (Calculated) : 79.9 Heparin Dosing Weight: 82.6  Vital Signs: Temp: 98.3 F (36.8 C) (11/17 0757) Temp Source: Oral (11/17 0757) BP: 110/90 (11/17 1026) Pulse Rate: 111 (11/17 1026)  Labs:  Recent Labs  12/24/2015 0759 12/14/2015 1541 12/06/2015 2036 12/21/2015 2113 12/20/15 0230 12/21/15 0330  HGB 11.9*  --  12.0* 12.1*  --  11.9*  HCT 36.5*  --  36.6* 37.0*  --  36.0*  PLT 150  --  143* 148*  --  147*  LABPROT 35.1*  --   --   --  29.1* 36.2*  INR 3.39  --   --   --  2.69 3.53  CREATININE 3.23*  --  2.64* 2.94* 2.76* 2.85*  TROPONINI  --  0.19* 0.27*  --  0.70*  --     Estimated Creatinine Clearance: 22.6 mL/min (by C-G formula based on SCr of 2.85 mg/dL (H)).   Medical History: Past Medical History:  Diagnosis Date  . Atrial fibrillation ([place]) 04/04/2010  . AV BLOCK 05/22/2008  . CAD, ARTERY BYPASS GRAFT 05/22/2008  . CARDIOMYOPATHY, ISCHEMIC 05/22/2008  . CAROTID ARTERY STENOSIS 11/15/2009  . CHF (congestive heart failure) ([facility])    class 2 to 3  . Chronic systolic heart failure ([place]) 10/13/2008  .  COLONIC POLYPS, HX OF 04/07/2008  . Decreased left ventricular function   . DEGENERATIVE JOINT DISEASE, LEFT KNEE 04/07/2008  . DIVERTICULITIS, HX OF 04/07/2008  . GERD 04/07/2008  . HYPERLIPIDEMIA 04/07/2008  . HYPERTENSION 04/07/2008  . MITRAL VALVE PROLAPSE, NON-RHEUMATIC 05/22/2008  . RENAL INSUFFICIENCY 10/17/2008  . Shortness of breath dyspnea   . Ventricular tachycardia ([place])    Status post catheter ablation may 2013   Assessment: 82 yo male with Hx of Afib s/p VT arrest on amio and lido gtts. Warfarin being held in anticipation of possible procedures/intubation with plans to start heparin when INR < 2. INR now up to 3.53, last warfarin dose 11/15. CBC low stable, plt 147, no S/Sx bleeding documented.  Goal of Therapy:  Heparin level 0.3-0.7 units/ml Monitor platelets by anticoagulation protocol: Yes   Plan:  -Hold anticoagulation for now -Monitor daily INR, CBC  Arrie Senate, PharmD PGY-1 Pharmacy Resident Pager: [phone] 12/21/2015

## 2015-12-21 NOTE — Progress Notes (Signed)
   Family requesting transfer to [place] for comfort care.   Dr Aundra Dubin at bedside. Stop all drips. ICD deactivated earlier today.   Can give morphine as needed. Palliative Care consult pending.    Danese Dorsainvil NP-C  4:50 PM'

## 2015-12-22 DIAGNOSIS — I5043 Acute on chronic combined systolic (congestive) and diastolic (congestive) heart failure: Secondary | ICD-10-CM

## 2015-12-22 DIAGNOSIS — J811 Chronic pulmonary edema: Secondary | ICD-10-CM

## 2015-12-22 DIAGNOSIS — Z789 Other specified health status: Secondary | ICD-10-CM

## 2015-12-22 DIAGNOSIS — Z515 Encounter for palliative care: Secondary | ICD-10-CM

## 2015-12-22 DIAGNOSIS — R0603 Acute respiratory distress: Secondary | ICD-10-CM

## 2015-12-22 DIAGNOSIS — J810 Acute pulmonary edema: Secondary | ICD-10-CM

## 2015-12-22 DIAGNOSIS — R0600 Dyspnea, unspecified: Secondary | ICD-10-CM

## 2015-12-22 DIAGNOSIS — R410 Disorientation, unspecified: Secondary | ICD-10-CM

## 2015-12-22 MED ORDER — LORAZEPAM 2 MG/ML IJ SOLN
0.5000 mg | INTRAMUSCULAR | Status: DC | PRN
  Administered 2015-12-22: 0.5 mg via INTRAVENOUS
  Filled 2015-12-22: qty 1

## 2015-12-22 MED ORDER — LORAZEPAM 2 MG/ML IJ SOLN: 0.5000 mg | INTRAMUSCULAR | Status: DC | PRN

## 2015-12-22 MED ORDER — POTASSIUM CHLORIDE CRYS ER 20 MEQ PO TBCR
20.0000 meq | EXTENDED_RELEASE_TABLET | Freq: Once | ORAL | Status: AC
  Administered 2015-12-22: 20 meq via ORAL
  Filled 2015-12-22: qty 1

## 2015-12-22 MED ORDER — SODIUM CHLORIDE 0.9% FLUSH
10.0000 mL | INTRAVENOUS | Status: DC | PRN
  Administered 2015-12-22: 10 mL
  Filled 2015-12-22: qty 40

## 2015-12-22 MED ORDER — MORPHINE SULFATE (PF) 2 MG/ML IV SOLN
1.0000 mg | INTRAVENOUS | Status: DC | PRN
  Administered 2015-12-22: 1 mg via INTRAVENOUS
  Administered 2015-12-22: 2 mg via INTRAVENOUS
  Filled 2015-12-22: qty 1

## 2015-12-22 MED ORDER — MORPHINE SULFATE (PF) 2 MG/ML IV SOLN: 1.0000 mg | INTRAVENOUS | Status: DC | PRN

## 2015-12-22 MED ORDER — FAMOTIDINE 10 MG PO TABS
10.0000 mg | ORAL_TABLET | Freq: Every day | ORAL | Status: DC
  Administered 2015-12-22: 10 mg via ORAL
  Filled 2015-12-22: qty 1

## 2015-12-22 MED ORDER — DEXAMETHASONE SODIUM PHOSPHATE 4 MG/ML IJ SOLN
4.0000 mg | INTRAMUSCULAR | Status: AC
  Administered 2015-12-22: 4 mg via INTRAVENOUS
  Filled 2015-12-22: qty 1

## 2015-12-22 MED ORDER — GLYCOPYRROLATE 0.2 MG/ML IJ SOLN: 0.2000 mg | INTRAMUSCULAR | Status: DC | PRN

## 2015-12-22 MED ORDER — FUROSEMIDE 10 MG/ML IJ SOLN
80.0000 mg | Freq: Once | INTRAMUSCULAR | Status: AC
  Administered 2015-12-22: 80 mg via INTRAVENOUS
  Filled 2015-12-22: qty 8

## 2015-12-22 MED ORDER — IPRATROPIUM-ALBUTEROL 0.5-2.5 (3) MG/3ML IN SOLN
3.0000 mL | Freq: Four times a day (QID) | RESPIRATORY_TRACT | Status: DC
  Administered 2015-12-22 – 2015-12-23 (×4): 3 mL via RESPIRATORY_TRACT
  Filled 2015-12-22 (×4): qty 3

## 2015-12-22 NOTE — Consult Note (Signed)
Consultation Note Date: 12/22/2015   Patient Name: Sean [name]  DOB: [birth year]  MRN: [id number]  Age / Sex: 82 y.o., male  PCP: Eulas Post, MD Referring Physician: Larey Dresser, MD  Reason for Consultation: Establishing goals of care, Inpatient hospice referral, Non pain symptom management, Pain control and Psychosocial/spiritual support  HPI/Patient Profile: 82 y.o. male  with past medical history of Coronary artery disease status post CABG, ischemic cardiomyopathy, chronic kidney disease stage 3-4, atrial fibrillation, AV block status post AICD implantation, thoracotomy, combined congestive heart failure, mitral valve prolapse, ventricular tachycardia admitted on 12/30/2015 with unwitnessed syncopal episode.. While in the emergency room he arrested. His defibrillator did not fire and he was noted to be in V. fib. CPR was performed for 1 minute, defibrillation as well as external pacer wires were placed. Despite medical management, supportive therapy patient continued to have V. tach, hypotension. Dr. Benjamine Mola spoke to patient and family on 12/21/2015 and he was made DO NOT RESUSCITATE /DO NOT INTUBATE and the decision was made to turn off defibrillator and move out of ICU for comfort focused approach to care. Morphine continuous infusion was started early this morning for respiratory comfort at 1 mg an hour.   Clinical Assessment and Goals of Care: Patient seen, wife and daughter at bedside in am. Patient is verbalizing feeling much better since morphine has been started. He still visibly short of breath at rest with extended conversation. No wheezing heard  this morning when I first met Mr. Mollie Germany but when I revisited he and his family later in the afternoon to speak with his son, audible wheezing, increased work of breathing was noted. Additional morphine bolus was given, nebulizer treatments  ordered, Ativan as well as Decadron 4 mg 1 was given.  Family has requested that I share with him his prognosis. They are concerned that he does not recognize that he is at end-of-life. I did do this. He became tearful. He states that he has been really sick before and was hoping this was something that he could rebound from as well. He is most worried about his wife and children, more so than himself. I did share with him barring an acute event for which she is at high risk , he had potentially a prognosis of days to weeks  NEXT OF KIN His wife, Inez Catalina. Patient at this point is still able to make decisions for himself. They're working together as a family    SUMMARY OF RECOMMENDATIONS   DNR/DNI Goal is to remain in the hospital at this point for end-of-life care Patient appears very fragile especially with worsening respiratory status as the day is going forward to transport I did introduce concept of inpatient hospice this morning but at this point we are holding off given rapid clinical decline Code Status/Advance Care Planning:  DNR    Symptom Management:   Dyspnea: Continue with morphine infusion. We'll increase dose to 2 mg an hour, 1-2 mg every hour as needed for shortness of breath. Monitor  and titrate for effect We'll up titrate O2 to 4 L for comfort; Decadron 4 mg IV 1 dose; duo nebs every 6 hours as needed. We'll add Ativan which can be used for anxiety as well as dyspnea not relieved by morphine  Pain: Patient underwent CPR in the emergency room and is still having some chest pain; willmanage with morphine infusion   Anxiety: Continue his Xanax 0.25 mg as needed and will add Ativan 0.5-1 mg every 2 hours as needed IV for acute anxiety related to dyspnea  Congestion: Robinul 0.2-0.4 mg every 4 hours as needed. Monitor for need for scheduled dosing  Palliative Prophylaxis:   Aspiration, Bowel Regimen, Delirium Protocol, Eye Care, Frequent Pain Assessment, Oral Care and Turn  Reposition  Additional Recommendations (Limitations, Scope, Preferences):  Minimize Medications, Initiate Comfort Feeding, No Artificial Feeding, No Blood Transfusions, No Chemotherapy, No Diagnostics, No Glucose Monitoring, No Hemodialysis, No IV Antibiotics, No Lab Draws, No Radiation, No Surgical Procedures and No Tracheostomy  Psycho-social/Spiritual:   Desire for further Chaplaincy support:yes  Additional Recommendations: Grief/Bereavement Support  Prognosis:   < 2 weeks  Discharge Planning: Anticipated Hospital Death      Primary Diagnoses: Present on Admission: . Syncope   I have reviewed the medical record, interviewed the patient and family, and examined the patient. The following aspects are pertinent.  Past Medical History:  Diagnosis Date  . Atrial fibrillation ([place]) 04/04/2010  . AV BLOCK 05/22/2008  . CAD, ARTERY BYPASS GRAFT 05/22/2008  . CARDIOMYOPATHY, ISCHEMIC 05/22/2008  . CAROTID ARTERY STENOSIS 11/15/2009  . CHF (congestive heart failure) ([facility])    class 2 to 3  . Chronic systolic heart failure ([place]) 10/13/2008  . COLONIC POLYPS, HX OF 04/07/2008  . Decreased left ventricular function   . DEGENERATIVE JOINT DISEASE, LEFT KNEE 04/07/2008  . DIVERTICULITIS, HX OF 04/07/2008  . GERD 04/07/2008  . HYPERLIPIDEMIA 04/07/2008  . HYPERTENSION 04/07/2008  . MITRAL VALVE PROLAPSE, NON-RHEUMATIC 05/22/2008  . RENAL INSUFFICIENCY 10/17/2008  . Shortness of breath dyspnea   . Ventricular tachycardia ([place])    Status post catheter ablation may 2013   Social History   Social History  . Marital status: Married    Spouse name: N/A  . Number of children: 2  . Years of education: N/A   Occupational History  .  Retired   Social History Main Topics  . Smoking status: Former Smoker    Packs/day: 0.50    Years: 30.00    Types: Cigarettes    Quit date: 04/24/1972  . Smokeless tobacco: Never Used  . Alcohol use No  . Drug use: No  . Sexual activity: Not Asked   Other  Topics Concern  . None   Social History Narrative  . None   Family History  Problem Relation Age of Onset  . Heart disease Mother     CAD, deceased   . Coronary artery disease Mother   . Heart disease Sister   . Heart disease Brother    Scheduled Meds: . famotidine  10 mg Oral Daily  . sodium chloride flush  3 mL Intravenous Q12H   Continuous Infusions: . morphine 1 mg/hr (12/22/15 0005)   PRN Meds:.acetaminophen, LORazepam, morphine injection, ondansetron (ZOFRAN) IV, sodium chloride flush, sodium chloride flush Medications Prior to Admission:  Prior to Admission medications   Medication Sig Start Date End Date Taking? Authorizing Provider  amiodarone (PACERONE) 100 MG tablet Take 1 tablet (100 mg total) by mouth daily. 12/14/15  Yes Elby Showers  Aundra Dubin, MD  atorvastatin (LIPITOR) 80 MG tablet Take 1 tablet (80 mg total) by mouth daily. 12/17/15  Yes Larey Dresser, MD  carvedilol (COREG) 25 MG tablet TAKE 1 TABLET BY MOUTH TWICE DAILY 06/13/15  Yes Larey Dresser, MD  donepezil (ARICEPT) 10 MG tablet TAKE 1 TABLET(10 MG) BY MOUTH AT BEDTIME 11/14/15  Yes Eulas Post, MD  furosemide (LASIX) 80 MG tablet Take 1 tablet (80 mg total) by mouth 2 (two) times daily. Take 42m in the AM and 449min the PM Patient taking differently: Take 80 mg by mouth 2 (two) times daily.  12/17/15  Yes DaLarey DresserMD  isosorbide mononitrate (IMDUR) 120 MG 24 hr tablet Take 1 tablet (120 mg total) by mouth daily. 10/04/15  Yes DaLarey DresserMD  losartan (COZAAR) 50 MG tablet Take 0.5 tablets (25 mg total) by mouth daily. 12/17/15  Yes DaLarey DresserMD  omeprazole (PRILOSEC) 20 MG capsule TAKE ONE CAPSULE BY MOUTH TWICE DAILY 10/23/15  Yes BrEulas PostMD  potassium chloride SA (K-DUR,KLOR-CON) 20 MEQ tablet Take 40 meq (2 tabs) in am and 20 meq (1 tab) in pm 12/14/15  Yes DaLarey DresserMD  ranolazine (RANEXA) 1000 MG SR tablet Take 1 tablet (1,000 mg total) by mouth 2 (two) times  daily. 03/20/15  Yes BrBrett CanalesPA-C  warfarin (COUMADIN) 5 MG tablet Take 2.5 mg by mouth daily. Patient takes 2.5 mg everyday except on Monday, on Monday patient takes no coumadin.   Yes Historical Provider, MD  nitroGLYCERIN (NITROSTAT) 0.4 MG SL tablet PLACE 1 TABLET UNDER THE TONGUE EVERY 5 MINUTES AS NEEDED FOR CHEST PAIN 11/15/14   DaLarey DresserMD  warfarin (COUMADIN) 5 MG tablet TAKE AS DIRECTED BY COUMADIN CLINIC 10/30/15   DaLarey DresserMD   Allergies  Allergen Reactions  . Carisoprodol     REACTION: rash  . Lisinopril     REACTION: cough  . Tape     Cloth Tape tears skin  . Zolpidem Tartrate Other (See Comments)    Altered mental status  . Penicillins Rash    Has patient had a PCN reaction causing immediate rash, facial/tongue/throat swelling, SOB or lightheadedness with hypotension:YES Has patient had a PCN reaction causing severe rash involving mucus membranes or skin necrosis: NO Has patient had a PCN reaction that required hospitalization NO Has patient had a PCN reaction occurring within the last 10 years: NO If all of the above answers are "NO", then may proceed with Cephalosporin use.   Review of Systems  Unable to perform ROS: Other    Physical Exam  Constitutional: He appears well-developed and well-nourished.  HENT:  Head: Normocephalic and atraumatic.  Pulmonary/Chest: He has wheezes.  Increased work of breathing at rest.   Genitourinary:  Genitourinary Comments: foley  Musculoskeletal: Normal range of motion. He exhibits edema.  Neurological: He is alert.  Short term memory deficits noted  Skin: Skin is warm and dry.  Psychiatric:  Tearful with news of prognosis  Nursing note and vitals reviewed.   Vital Signs: BP (!) 121/50 (BP Location: Left Arm)   Pulse 71   Temp 98 F (36.7 C) (Oral)   Resp 19   Ht 6' 1" (1.854 m)   Wt 82.4 kg (181 lb 10.5 oz)   SpO2 100%   BMI 23.97 kg/m  Pain Assessment: No/denies pain   Pain Score: 0-No  pain   SpO2: SpO2: 100 % O2  Device:SpO2: 100 % O2 Flow Rate: .O2 Flow Rate (L/min): 2 L/min  IO: Intake/output summary:  Intake/Output Summary (Last 24 hours) at 12/22/15 0956 Last data filed at 12/22/15 5015  Gross per 24 hour  Intake           725.22 ml  Output             1850 ml  Net         -1124.78 ml    LBM: Last BM Date: 12/06/2015 Baseline Weight: Weight: 81.9 kg (180 lb 9.6 oz) Most recent weight: Weight: 82.4 kg (181 lb 10.5 oz)     Palliative Assessment/Data:   Flowsheet Rows   Flowsheet Row Most Recent Value  Intake Tab  Referral Department  Cardiology  Unit at Time of Referral  Med/Surg Unit  Palliative Care Primary Diagnosis  Cardiac  Date Notified  12/21/15  Palliative Care Type  New Palliative care  Reason for referral  Non-pain Symptom, Pain, Clarify Goals of Care, Counsel Regarding Hospice, Psychosocial or Spiritual support  Date of Admission  12/22/15  Date first seen by Palliative Care  12/22/15  # of days Palliative referral response time  1 Day(s)  # of days IP prior to Palliative referral  -1  Clinical Assessment  Palliative Performance Scale Score  30%  Pain Max last 24 hours  Not able to report  Pain Min Last 24 hours  Not able to report  Dyspnea Max Last 24 Hours  Not able to report  Dyspnea Min Last 24 hours  Not able to report  Nausea Max Last 24 Hours  Not able to report  Nausea Min Last 24 Hours  Not able to report  Anxiety Max Last 24 Hours  Not able to report  Anxiety Min Last 24 Hours  Not able to report  Other Max Last 24 Hours  Not able to report  Psychosocial & Spiritual Assessment  Palliative Care Outcomes  Patient/Family meeting held?  Yes  Who was at the meeting?  pt wife son dtr  Patient/Family wishes: Interventions discontinued/not started   Mechanical Ventilation, BiPAP, Hemodialysis, Transfusion, Vasopressors, Trach, NIPPV, Tube feedings/TPN, Antibiotics, Transfer out of ICU, PEG  Palliative Care follow-up planned  Yes,  Facility      Time In: 0830; 1530 Time Out: 0930; 1700 Time Total: 150 min Greater than 50%  of this time was spent counseling and coordinating care related to the above assessment and plan.  Signed by: Dory Horn, NP   Please contact Palliative Medicine Team phone at [phone] for questions and concerns.  For individual provider: See Shea Evans

## 2015-12-22 NOTE — Progress Notes (Addendum)
Subjective:    Admitted pre syncope/syncope. Device interrogation was negative for arrhythmias.   12/20/15 VT arrest but slow VT and did not have defibrillation. Shock x1 and 1 minutes CPR. Started on amio drip + lidocaine drip.   Became short of breath, required Bipap.  Diuresed with IV lasix. Weight down 6 pounds. Started on milrinone 0.25 mcg. Initial CO-OX 45%. Milrinone was increased to 0.375 mcg.  Continued to have intermittent VT and hypotension yesterday.  Lido gtt stopped.  Patient made DNR/DNI and AICD turned with continued pacing.  Mexiletine added.    Family and patient decided on comfort care and now on 6N.  Palliative care consult pending.  Now getting morphine gtt.   Objective:   Weight Range:  Vital Signs:   Temp:  [98 F (36.7 C)-98.2 F (36.8 C)] 98 F (36.7 C) (11/18 0534) Pulse Rate:  [70-85] 71 (11/18 0534) Resp:  [18-27] 19 (11/18 0534) BP: (105-122)/(48-65) 121/50 (11/18 0534) SpO2:  [95 %-100 %] 100 % (11/18 0534) Last BM Date: 12/08/2015  Weight change: Filed Weights   12/29/2015 1429 12/20/15 0300 12/21/15 0300  Weight: 182 lb (82.6 kg) 187 lb (84.8 kg) 181 lb 10.5 oz (82.4 kg)    Intake/Output:   Intake/Output Summary (Last 24 hours) at 12/22/15 1036 Last data filed at 12/22/15 0626  Gross per 24 hour  Intake           445.52 ml  Output             1850 ml  Net         -1404.48 ml     Physical Exam: CVP 4-5 General: WD WN in NAD HEENT: normal Neck: supple. JVP difficult to assess with bipap in place. Carotids 2+ bilat; no bruits. No lymphadenopathy or thryomegaly appreciated. Cor: PMI nondisplaced. Regular rate & rhythm. No rubs, gallops or murmurs. Zoll pads in place.  Lungs: Decreased in the bases. Crackles at bases Abdomen: soft, nontender, nondistended. No hepatosplenomegaly. No bruits or masses. Good bowel sounds. Extremities: no cyanosis, clubbing, rash, R and LLE trace  edema. Extremities warm. R and L hand soft mittens in place.    Neuro: Oriented to self. Combative.   Telemetry: A paced V sensed 70 s  Labs: Basic Metabolic Panel:  Recent Labs Lab 12/10/2015 0759 12/21/2015 2036 12/22/2015 2113 12/20/15 0230 12/21/15 0330  NA 137 136 137 137 136  K 3.8 3.6 4.7 4.3 3.4*  CL 101 105 103 104 99*  CO2 26 22 26 28 25   GLUCOSE 173* 125* 170* 135* 133*  BUN 36* 33* 34* 33* 40*  CREATININE 3.23* 2.64* 2.94* 2.76* 2.85*  CALCIUM 9.2 9.0 9.4 9.2 8.8*  MG  --  2.2 2.1  --  2.0    Liver Function Tests:  Recent Labs Lab 12/15/2015 0759  AST 27  ALT 18  ALKPHOS 26*  BILITOT 0.9  PROT 6.5  ALBUMIN 3.5   No results for input(s): LIPASE, AMYLASE in the last 168 hours. No results for input(s): AMMONIA in the last 168 hours.  CBC:  Recent Labs Lab 12/12/2015 0759 12/05/2015 2036 01/02/2016 2113 12/21/15 0330  WBC 5.1 8.1 8.6 9.6  NEUTROABS 3.6  --   --   --   HGB 11.9* 12.0* 12.1* 11.9*  HCT 36.5* 36.6* 37.0* 36.0*  MCV 88.8 90.4 89.8 87.2  PLT 150 143* 148* 147*    Cardiac Enzymes:  Recent Labs Lab 12/07/2015 1541 12/18/2015 2036 12/20/15 0230  TROPONINI  0.19* 0.27* 0.70*    BNP: BNP (last 3 results)  Recent Labs  03/15/15 0548 10/17/15 1349 12/14/15 1005  BNP 441.9* 591.8* 779.2*    ProBNP (last 3 results) No results for input(s): PROBNP in the last 8760 hours.    Other results:  Imaging: Dg Chest Port 1 View  Result Date: 12/21/2015 CLINICAL DATA:  Pulmonary edema Hx of CHF, HTN, CAD, AV block, chronic systolic heart failure, carotid artery stenosis, atrial fibrillation Pt has had a central line for a few days EXAM: PORTABLE CHEST 1 VIEW COMPARISON:  12/20/2015 FINDINGS: Vascular congestion with hazy central airspace opacity has mildly improved from the previous day's exam. No new lung abnormalities. Cardiac silhouette is mildly enlarged. Stable changes from CABG surgery are noted. No mediastinal or hilar masses. Right anterior chest wall biventricular cardioverter-defibrillator is  stable. Right internal jugular central venous line is stable, tip in the lower superior vena cava. IMPRESSION: 1. Mildly improved congestive heart failure. 2. No new abnormalities. Electronically Signed   By: Lajean Manes M.D.   On: 12/21/2015 10:07   Dg Chest Port 1 View  Result Date: 12/20/2015 CLINICAL DATA:  Encounter for central line placement EXAM: PORTABLE CHEST 1 VIEW COMPARISON:  Earlier today FINDINGS: Right IJ central line has been placed with tip at the upper cavoatrial junction. No pneumothorax. No new mediastinal widening. Cardiopericardial enlargement. Status post CABG. Biventricular ICD/ pacer from the right in unremarkable position. Diffuse interstitial opacity consistent with pulmonary edema. Small right pleural effusion. Bilateral airspace disease as described on chest CT from yesterday. IMPRESSION: 1. New central line without adverse finding. 2. CHF that is stable from earlier today. Electronically Signed   By: Monte Fantasia M.D.   On: 12/20/2015 10:47     Medications:     Scheduled Medications: . famotidine  10 mg Oral Daily  . sodium chloride flush  3 mL Intravenous Q12H    Infusions: . morphine 1 mg/hr (12/22/15 0005)    PRN Medications: acetaminophen, LORazepam, morphine injection, ondansetron (ZOFRAN) IV, sodium chloride flush, sodium chloride flush   Assessment/Plan/Discussion   82 yo with CAD s/p CABG, CKD stage III, ischemic cardiomyopathy, h/o VT presented with presyncope.  BP has been low and he has been lightheaded for a few weeks now, worse recently.  1. VT arrest 12/16/2015: Shock x1 but externally and not by device as VT was slow and not detected. Started on amio drip + lidocaine. K 4.3 Mag 2.1.  Suspect that his presenting syncopal event may have been slow VT that was not picked up by device.  - Now off antiarrhythmic meds and ICD turned off.    2. Presyncope/syncope: Occurred prior to admit. Creatinine on admit 3.23.  Wife states he has been more  confused.  Initially though orthostatic symptoms with over-diuresis as ICD interrogation in the ED did not show VT or atrial fibrillation.  However, could have been slow VT episode that was not detected by device.  3. Confusion: Delirium.  Baseline dementia. Thought to be worse due to low BP/cerebral hypoperfusion.  CT head with no acute findings.   4. CAD: s/p CABG. Chronic stable angina, no recent changes.  No chest pain prior to this admission.  TnI mildly elevated to 0.7, suspect that this may be demand ischemia due to hypotension with arrest.   4.  Acute on chronic systolic CHF: Ischemic cardiomyopathy. EF 25-30% on last echo. He has a [employer], [employer] CRT-D device. Volume status on admit was low but now overloaded  post arrest and resuscitation. Low output initially, started on milrinone.  But now off as patient is comfort care. -await palliative care consult - continue morphine gtt  5.   Paroxysmal atrial fibrillation: He is in NSR today.   6.  AKI on CKD: Suspect prerenal vs ATN in setting of low BP/over-diuresis. Creatinine now trending down towards baseline. Creatinine 2.8 yesterday.  No more lab draws as patient is comfort care.  7.  Abnormal CXR: More prominent peri-hilar opacity.  CT of chest 7 cm area of right lower lobe airspace disease, chronic based on radiography from earlier this year. A smaller ground-glass opacity was present in this region on 2006 chest CT. Constellation of findings is primarily concerning for adenocarcinoma. 2.7 cm ground-glass nodule in the subpleural left upper lobe, also primarily concerning for neoplasm.    Had long discussion with wife today.  They are hoping to transfer patient to inpatient Palliative Care.  He appears comfortable this am although mildly SOB and has some crackles on exam.  Will give a dose of IV Lasix this am. Await placement from Palliative care.  I have spent a total of 35 minutes with patient reviewing notes , telemetry, EKGs, labs and  examining patient as well as establishing an assessment and plan that was discussed with the patient.  > 50% of time was spent in direct patient care.    Tressia Miners Turner 12/22/2015 10:36 AM

## 2015-12-22 NOTE — Progress Notes (Signed)
Pt is A&O x3, breathing is a little labored at this time, wheezing noted. Romona Curls Palliative NP is here, adjusted meds.  1730 Pt is feeling better, comfortable.

## 2015-12-22 NOTE — Progress Notes (Signed)
The family was so happy for the Morphine drip , pt is comfortable, they refused blood draw.

## 2015-12-23 DIAGNOSIS — Z515 Encounter for palliative care: Secondary | ICD-10-CM

## 2015-12-23 DIAGNOSIS — R570 Cardiogenic shock: Secondary | ICD-10-CM

## 2015-12-24 ENCOUNTER — Other Ambulatory Visit (HOSPITAL_COMMUNITY): Payer: Medicare Other

## 2016-01-03 ENCOUNTER — Ambulatory Visit: Payer: Self-pay | Admitting: Cardiology

## 2016-01-03 DIAGNOSIS — Z5181 Encounter for therapeutic drug level monitoring: Secondary | ICD-10-CM

## 2016-01-03 DIAGNOSIS — I059 Rheumatic mitral valve disease, unspecified: Secondary | ICD-10-CM

## 2016-01-03 DIAGNOSIS — I480 Paroxysmal atrial fibrillation: Secondary | ICD-10-CM

## 2016-01-04 NOTE — Progress Notes (Signed)
200 ml morphine wasted with Hulan Amato, RN.

## 2016-01-04 NOTE — Progress Notes (Signed)
Advanced Heart Failure Rounding Note   Subjective:    Admitted pre syncope/syncope. Device interrogation was negative for arrhythmias.   12/20/15 VT arrest but slow VT and did not have defibrillation. Shock x1 and 1 minutes CPR. Started on amio drip + lidocaine drip.   On 11/17 became short of breath, required Bipap.  Diuresed with IV lasix. Weight down 6 pounds. Started on milrinone 0.25 mcg. Initial CO-OX 45%. Milrinone was increased to 0.375 mcg.      Patient developed progressive confusion and dyspnea on 11/18.  He was made DNR/DNI, comfort measures only.  IV infusions stopped and he was moved to 6N.    Objective:   Weight Range:  Vital Signs:   Temp:  [97.7 F (36.5 C)-100.1 F (37.8 C)] 98.1 F (36.7 C) (11/19 0515) Pulse Rate:  [73-76] 73 (11/19 0757) Resp:  [15-18] 15 (11/19 0757) BP: (115-123)/(60-63) 115/63 (11/19 0515) SpO2:  [86 %-98 %] 97 % (11/19 0757) Last BM Date: 12/11/2015  Weight change: Filed Weights   12/26/2015 1429 12/20/15 0300 12/21/15 0300  Weight: 182 lb (82.6 kg) 187 lb (84.8 kg) 181 lb 10.5 oz (82.4 kg)    Intake/Output:   Intake/Output Summary (Last 24 hours) at 2016/01/21 0933 Last data filed at January 21, 2016 0515  Gross per 24 hour  Intake               20 ml  Output             1650 ml  Net            -1630 ml     Physical Exam: General: On Bipap.  HEENT: normal Neck: supple. JVP difficult to assess with bipap in place. Carotids 2+ bilat; no bruits. No lymphadenopathy or thryomegaly appreciated. Cor: PMI nondisplaced. Regular rate & rhythm. No rubs, gallops or murmurs.  Lungs: Decreased in the bases.  Abdomen: soft, nontender, nondistended. No hepatosplenomegaly. No bruits or masses. Good bowel sounds. Extremities: no cyanosis, clubbing, rash, R and LLE trace  edema. Extremities warm. R and L hand soft mittens in place.  Neuro: Oriented to self.    Labs: Basic Metabolic Panel:  Recent Labs Lab 01/01/2016 0759 12/12/2015 2036  12/31/2015 2113 12/20/15 0230 12/21/15 0330  NA 137 136 137 137 136  K 3.8 3.6 4.7 4.3 3.4*  CL 101 105 103 104 99*  CO2 26 22 26 28 25   GLUCOSE 173* 125* 170* 135* 133*  BUN 36* 33* 34* 33* 40*  CREATININE 3.23* 2.64* 2.94* 2.76* 2.85*  CALCIUM 9.2 9.0 9.4 9.2 8.8*  MG  --  2.2 2.1  --  2.0    Liver Function Tests:  Recent Labs Lab 12/29/2015 0759  AST 27  ALT 18  ALKPHOS 26*  BILITOT 0.9  PROT 6.5  ALBUMIN 3.5   No results for input(s): LIPASE, AMYLASE in the last 168 hours. No results for input(s): AMMONIA in the last 168 hours.  CBC:  Recent Labs Lab 12/31/2015 0759 12/28/2015 2036 12/16/2015 2113 12/21/15 0330  WBC 5.1 8.1 8.6 9.6  NEUTROABS 3.6  --   --   --   HGB 11.9* 12.0* 12.1* 11.9*  HCT 36.5* 36.6* 37.0* 36.0*  MCV 88.8 90.4 89.8 87.2  PLT 150 143* 148* 147*    Cardiac Enzymes:  Recent Labs Lab 12/21/2015 1541 12/22/2015 2036 12/20/15 0230  TROPONINI 0.19* 0.27* 0.70*    BNP: BNP (last 3 results)  Recent Labs  03/15/15 0548 10/17/15 1349 12/14/15 1005  BNP 441.9* 591.8* 779.2*    ProBNP (last 3 results) No results for input(s): PROBNP in the last 8760 hours.    Other results:  Imaging: No results found.   Medications:     Scheduled Medications: . famotidine  10 mg Oral Daily  . ipratropium-albuterol  3 mL Nebulization Q6H  . sodium chloride flush  3 mL Intravenous Q12H    Infusions: . morphine 2 mg/hr (12/22/15 1642)    PRN Medications: acetaminophen, glycopyrrolate, LORazepam, morphine injection, ondansetron (ZOFRAN) IV, sodium chloride flush, sodium chloride flush   Assessment/Plan/Discussion   82 yo with CAD s/p CABG, CKD stage III, ischemic cardiomyopathy, h/o VT presented with presyncope.  BP has been low and he has been lightheaded for a few weeks now, worse recently.  1. VT arrest 12/28/2015: Shock x1 but externally and not by device as VT was slow and not detected. Started on amio drip + lidocaine. K 4.3 Mag 2.1.   I suspect that his presenting syncopal event may have been slow VT that was not picked up by device.  - Now off anti-arrhythmics with comfort measures.   2. Presyncope/syncope: Occurred prior to admit. Creatinine on admit 3.23.  Wife states he has been more confused.  Initially though orthostatic symptoms with over-diuresis as ICD interrogation in the ED did not show VT or atrial fibrillation.  However, could have been slow VT episode that was not detected by device. 3. Confusion: Delirium.  Baseline dementia. Thought to be worse due to low BP/cerebral hypoperfusion.  CT head with no acute findings.  4. CAD: s/p CABG. Chronic stable angina, no recent changes.  No chest pain prior to this admission.  TnI mildly elevated to 0.7, suspect that this may be demand ischemia due to hypotension with arrest.  4. Acute on chronic systolic CHF: Ischemic cardiomyopathy. EF 25-30% on last echo. He has a [employer] officer, political party CRT-D device. Volume status on admit was low but now overloaded post arrest and resuscitation. Low output initially, started on milrinone.  Now comfort measures, off milrinone.  5. AKI on CKD: Suspect prerenal vs ATN in setting of low BP/over-diuresis. No longer following creatinine. 6. Abnormal CXR: More prominent peri-hilar opacity.  CT of chest 7 cm area of right lower lobe airspace disease, chronic based on radiography from earlier this year. A smaller ground-glass opacity was present in this region on 2006 chest CT. Constellation of findings is primarily concerning for adenocarcinoma. 2.7 cm ground-glass nodule in the subpleural left upper lobe, also primarily concerning for neoplasm.    Patient now comfort measures.  Suspect he will pass away in the next 24-48 hours.  Length of Stay: [place]  25-Dec-2015, 9:33 AM  Advanced Heart Failure Team Pager [phone] (M-F; 7a - 4p)  Please contact [facility] for night-coverage after hours (4p -7a ) and weekends on [email or web address]

## 2016-01-04 DEATH — deceased

## 2016-01-14 ENCOUNTER — Encounter (HOSPITAL_COMMUNITY): Payer: Medicare Other

## 2016-01-21 ENCOUNTER — Encounter (HOSPITAL_COMMUNITY): Payer: Self-pay

## 2016-01-21 NOTE — Progress Notes (Addendum)
Certificate of death completed and signed by Dr. Aundra Dubin. [facility] and [employer] provided addressed and stamped return envelop to send completed certificate via mail. Copy of certificate scanned into patient's electronic medical record. Certificate mailed per request.  Renee Pain

## 2016-01-31 ENCOUNTER — Encounter: Payer: Medicare Other | Admitting: Physician Assistant

## 2016-02-04 NOTE — Discharge Summary (Signed)
Advanced Heart Failure Team  Discharge Summary   Patient ID: Sean [name] MRN: [id number], DOB/AGE: 04/15/1931 83 y.o. Admit date: 12/05/2015 D/C date:     01/15/16    Primary Discharge Diagnoses:  1. VT arrest 01/02/2016: Shock x1 but externally  2. Presyncope/syncope: Occurred prior to admit.  3. Confusion: Baseline dementia. Thought to be worse due to low BP/cerebral hypoperfusion. CT head with no acute findings.  4. CAD: s/p CABG.  5. Acute on chronic systolic CHF: Ischemic cardiomyopathy. EF 25-30% on last echo. 6. Mitral regurgitation: Severe on last echo, probably ischemic MR.  7. Celiac artery stenosis 8. Paroxysmal atrial fibrillation 9. AKI on CKD 10. Abnormal CXR  Hospital Course:   Sean [name] is a 83 yo male with PMH of CAD s/p CABG, ICM s/p ICD EF 20-25%, CKD, VT s/p ablation and PAF admitted after having unwitnessed syncopal episode. ICD interrogated without evidence of arrhthymias. Initially thought to be orthostatic but later there was concern for slow VT not detected on his ICD. Creatinine on admit was elevated to 3.23 so  He was given IV fluids. Unfortunately later that he had VT arrest with shock x1 and CPR for 1 minutes. Amiodarone and lidocaine drips started. Post arrest he developed volume overload, respiratory distress, and cardiogenic shock. He was diuresed with IV lasix, placed on Bipap and started on milrinone. Unfortunately he continued to decline with increased WOB. Dr Aundra Dubin had lengthy discussions with his family regarding his condition. On November 17th the family elected DNR/DNI so ICD was deactivated. At that point he transitioned to comfort care. On November 19th he passed quietly with family at the bedside.   1. VT arrest 12/28/2015: Shock x1 but externally and not by device as VT was slow and not detected. Started on amio drip + lidocaine. K 4.3 Mag 2.1.  Suspect that his presenting syncopal event may have been slow VT that was not picked up by  device.  - Now off anti-arrhythmics with comfort measures.   2. Presyncope/syncope: Occurred prior to admit. Creatinine on admit 3.23.  Wife states he had been more confused. Initially though orthostatic symptoms with over-diuresis as ICD interrogation in the ED did not show VT or atrial fibrillation.  However, could have been slow VT episode that was not detected by device. 3. Confusion: Delirium.  Baseline dementia. Thought to be worse due to low BP/cerebral hypoperfusion. CT head did not show acute findings.  4. CAD: s/p CABG. Chronic stable angina, no recent changes. No chest pain prior to this admission. TnI mildly elevated to 0.7, suspect that this may be demand ischemia due to hypotension with arrest.  4. Acute on chronic systolic CHF: Ischemic cardiomyopathy. EF 25-30% on last echo. He has a [employer] officer, political party CRT-D device. Volume status on admit was low but overloaded post arrest and resuscitation. Low output initially, started on milrinone.  Later transitioned to comfort .  5. AKI on CKD: Suspect prerenal vs ATN in setting of low BP/over-diuresis. Creatinine peaked at 2.9.  6. Abnormal CXR: More prominent peri-hilar opacity. CT of chest 7 cm area of right lower lobe airspace disease, chronic based on radiography from earlier this year. A smaller ground-glass opacity was present in this region on 2006 chest CT. Constellation of findings is primarily concerning for adenocarcinoma. 2.7 cm ground-glass nodule in the subpleural left upper lobe, also primarily concerning for neoplasm.       Duration of Discharge Encounter: Greater than 35 minutes   Signed, Taurus Willis  NP-C  01/10/2016, 7:13 PM

## 2016-04-21 ENCOUNTER — Ambulatory Visit: Payer: Medicare Other | Admitting: Family Medicine

## 2018-01-24 IMAGING — CR DG CHEST 1V PORT
1 series · 1 of 1 positions shown · non-contrast
Comparison: Chest radiograph performed 02/06/2009

CLINICAL DATA: Acute onset of bilateral arm pain. Mid chest pain.
Chronic shortness of breath. Initial encounter.

EXAM:
PORTABLE CHEST 1 VIEW

[AP]
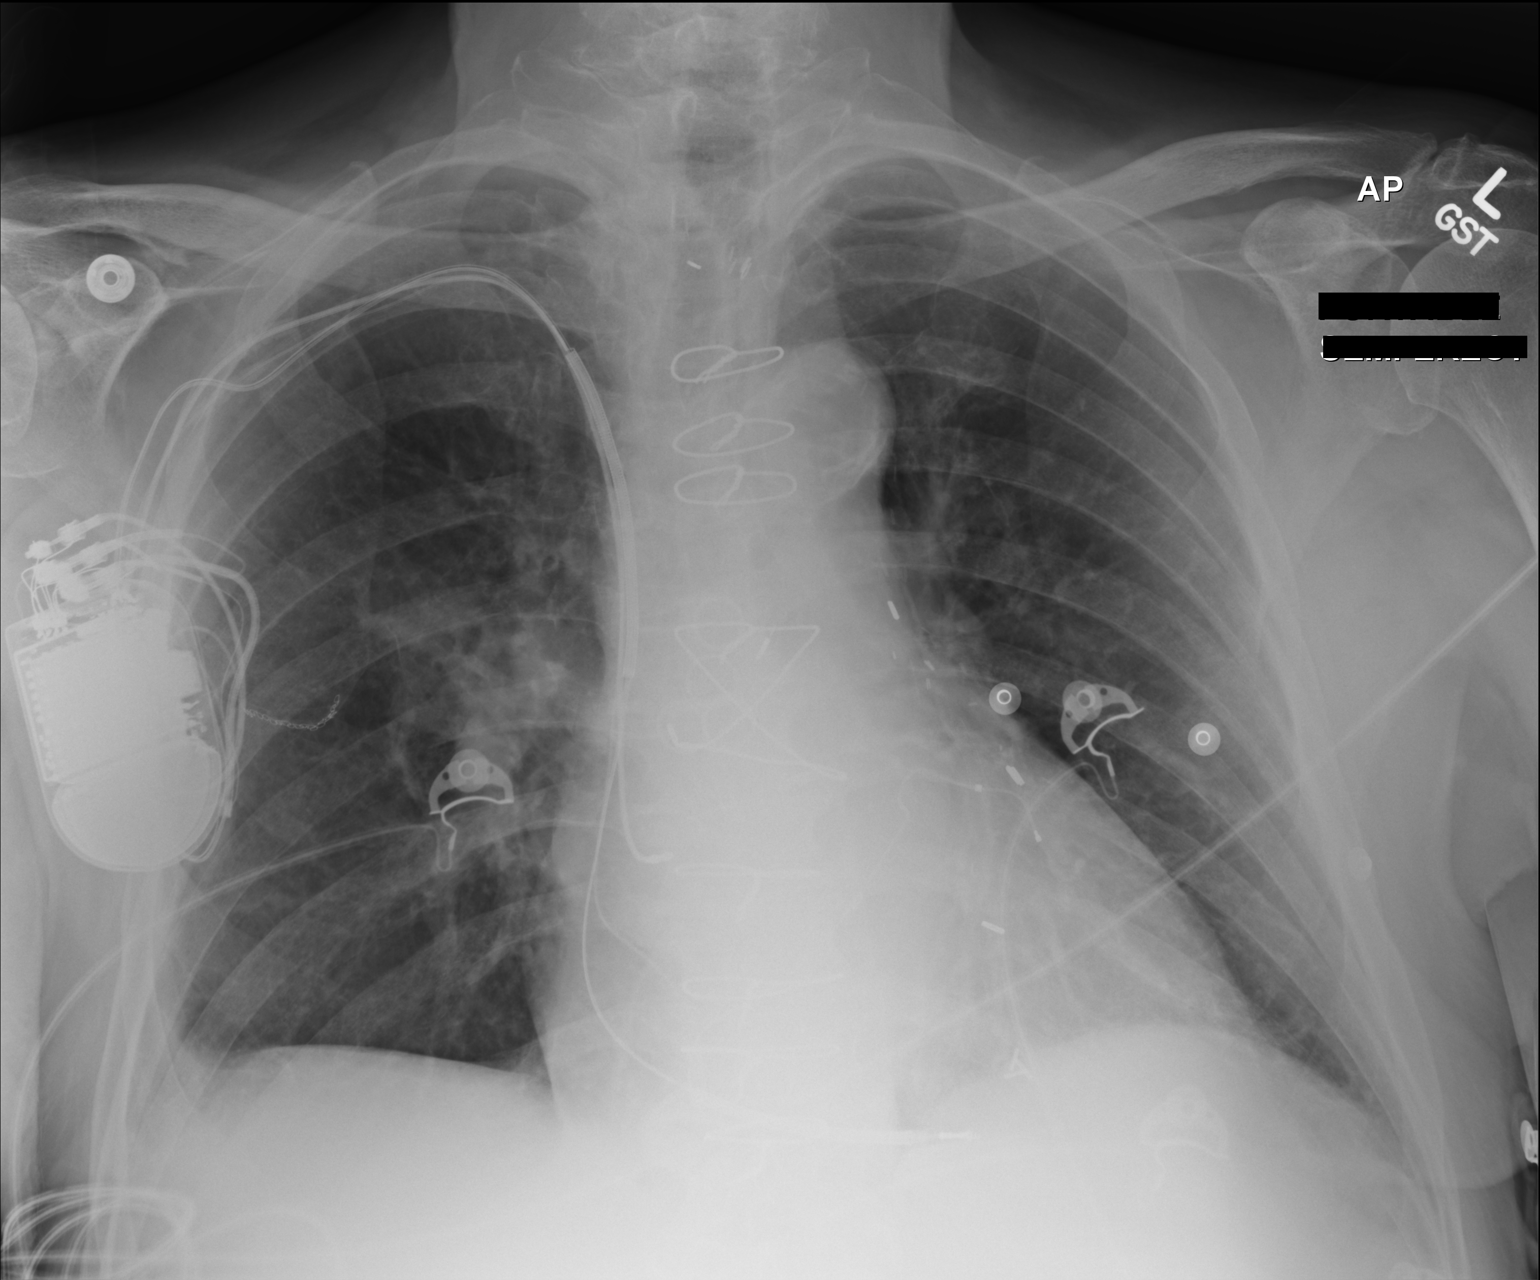

[1 of 1 positions shown; findings below may reference images not displayed]

FINDINGS: The lungs are well-aerated. Mild right midlung opacity may reflect
atelectasis or possibly mild pneumonia. There is no evidence of
pleural effusion or pneumothorax.

The cardiomediastinal silhouette is borderline normal in size. The
patient is status post median sternotomy. A pacemaker/AICD is noted
overlying the right chest wall, with leads ending overlying the
right atrium and right ventricle. No acute osseous abnormalities are
seen.
IMPRESSION: Mild right midlung opacity may reflect atelectasis or possibly mild
pneumonia. Followup PA and lateral chest X-ray is recommended in 3-4
weeks following trial of antibiotic therapy to ensure resolution and
exclude underlying malignancy. If the patient does not have symptoms
of pneumonia, a PA view of the chest could be considered for further
evaluation

## 2018-01-28 IMAGING — CR DG CHEST 2V
3 series · 3 of 3 positions shown · non-contrast
Comparison: Prior chest x-ray 03/15/2015

CLINICAL DATA: 83-year-old male with right mid lung opacity on
prior chest x-ray

EXAM:
CHEST  2 VIEW

[chest lat (1 of 2)]
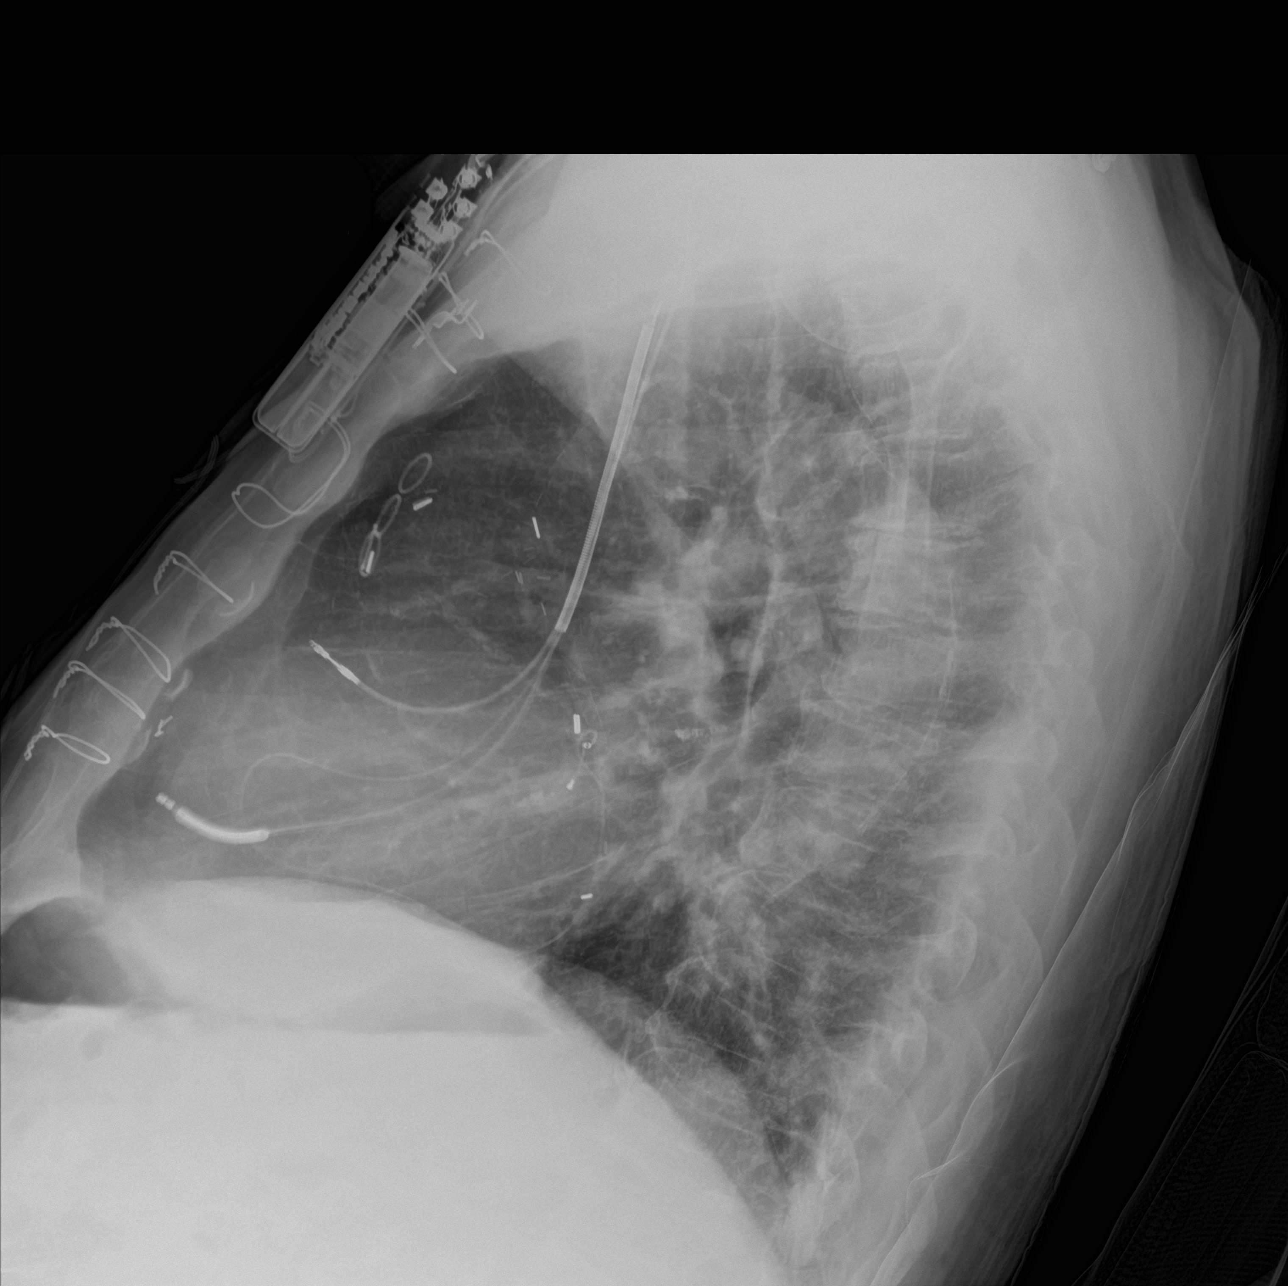

[chest lat (2 of 2)]
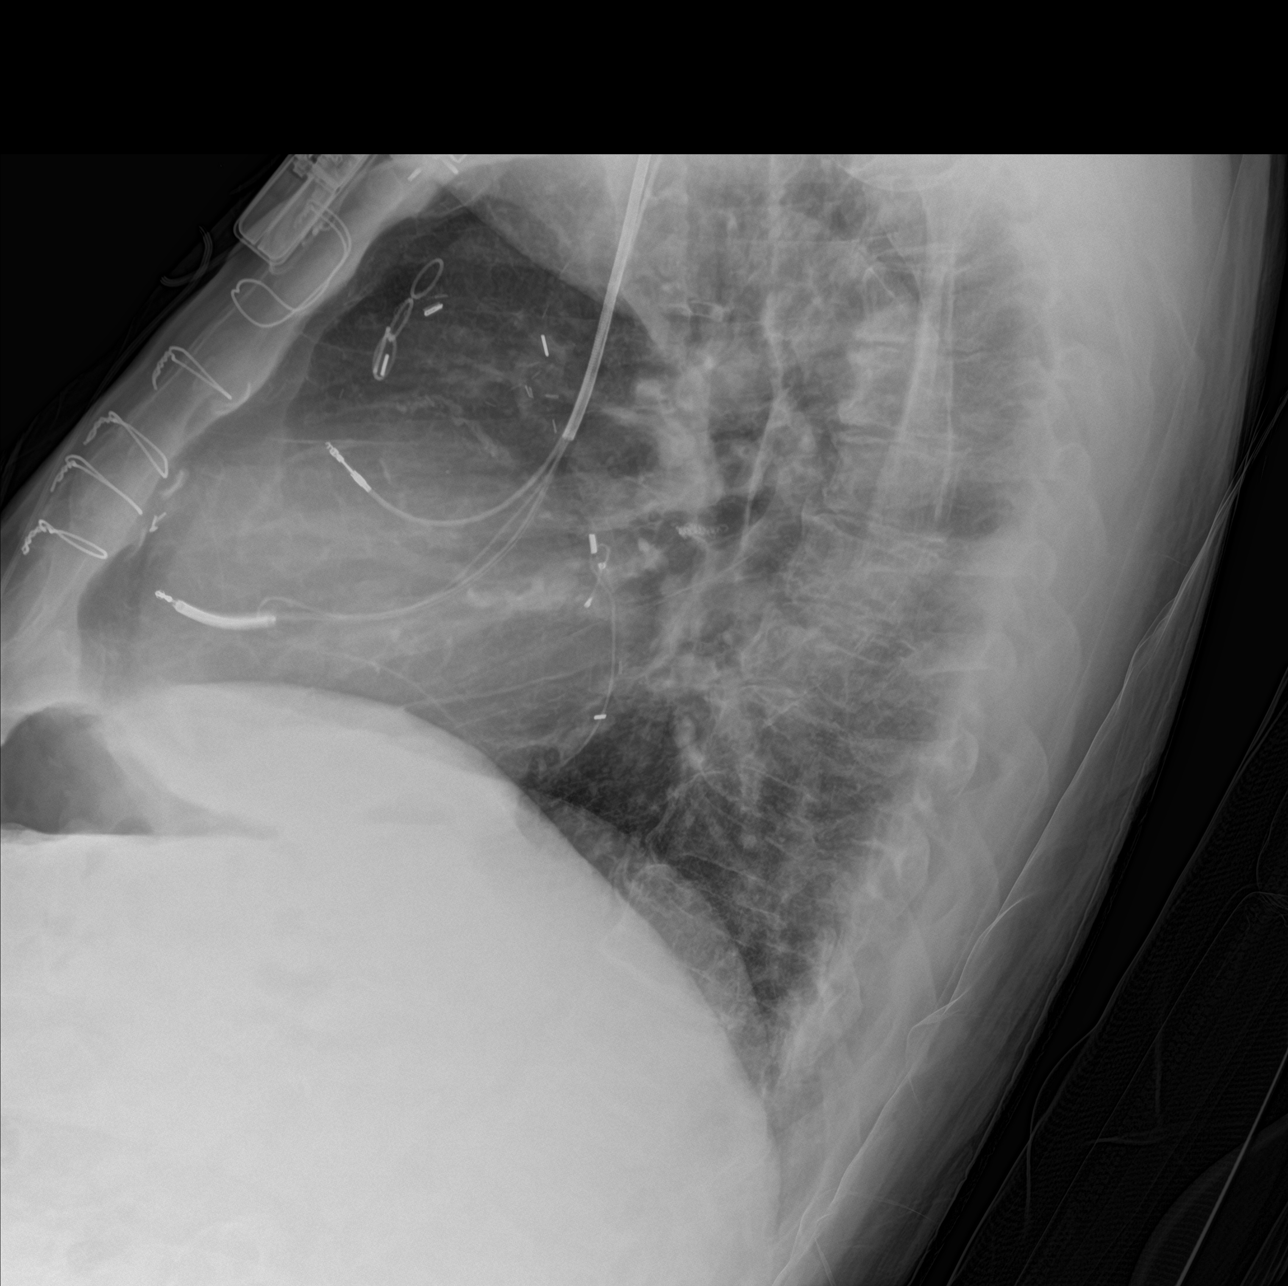

[chest ap]
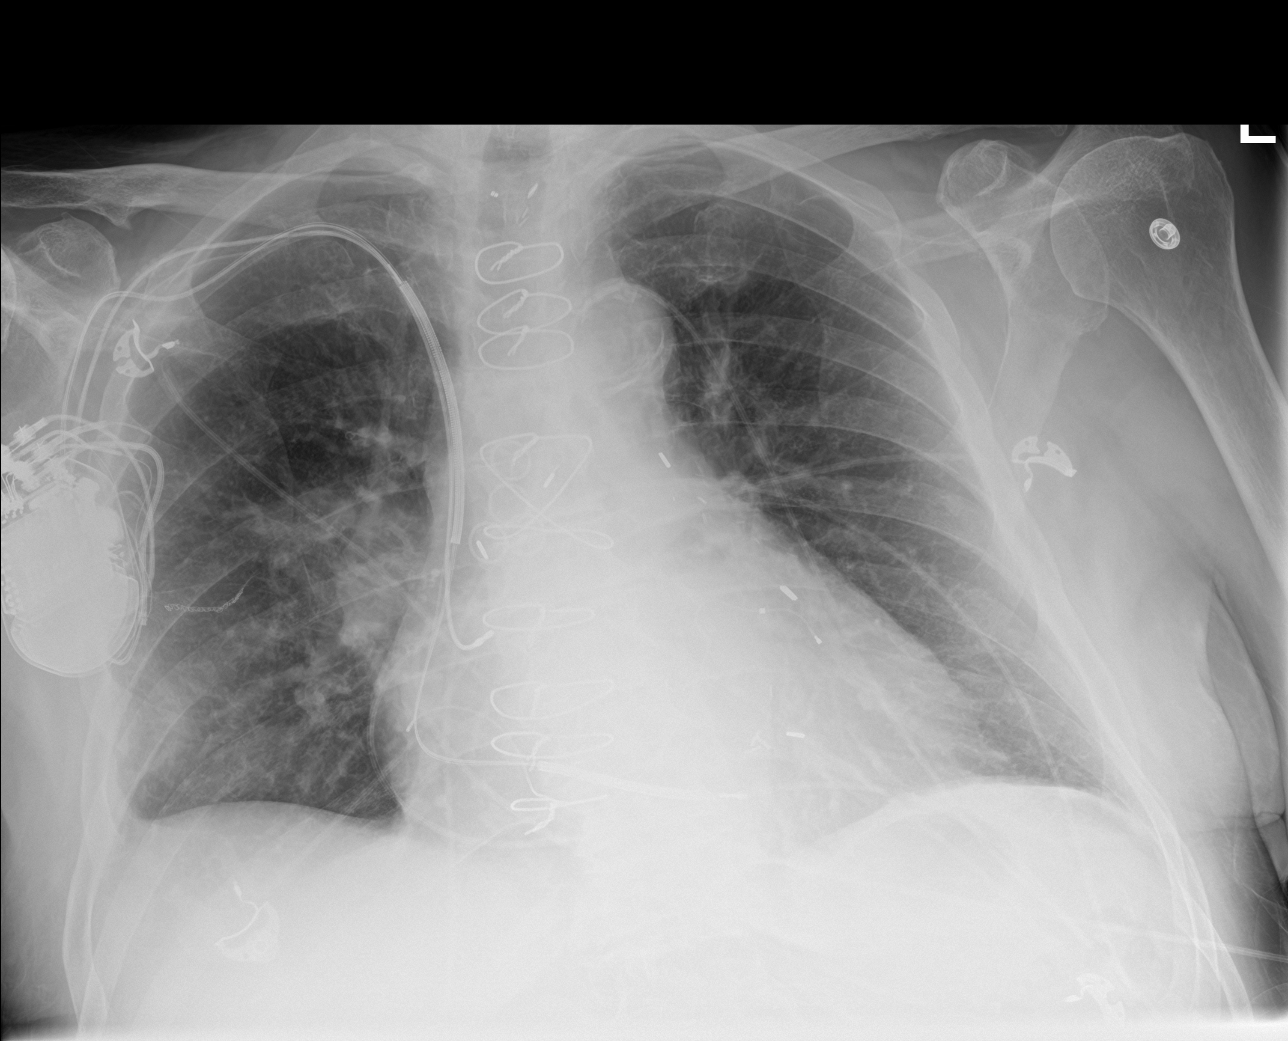

[3 of 3 positions shown; findings below may reference images not displayed]

FINDINGS: Right subclavian approach biventricular cardiac rhythm maintenance
device with leads projecting over the right atrium, right ventricle
and overlying the left ventricle. Patient is status post median
sternotomy with evidence of prior multivessel CABG including [REDACTED]
bypass. Stable cardiomegaly. Atherosclerotic calcifications again
noted in the transverse aorta. Inspiratory volumes are lower with
resultant bibasilar opacities favored to reflect atelectasis.
Surgical changes of prior right partial lung resection. Similar
degree of patchy opacity in the right perihilar region which does
not appear overtly mass length. Chronic blunting of right
costophrenic angle. No pulmonary edema or pneumothorax. No acute
osseous abnormality.
IMPRESSION: 1. Lower inspiratory volumes with developing bibasilar subsegmental
atelectasis.
2. Persistent nonspecific patchy airspace opacity in the right
perihilar region adjacent to chained sutures. This may represent a
region of pleural parenchymal scarring, perihilar atelectasis or
less likely infiltrate. There is no significant interval change
compared to the recent prior imaging from 03/15/2015.
3. Stable cardiomegaly.
